# Patient Record
Sex: Female | Born: 1965
Health system: Southern US, Community
[De-identification: ages and names within clinical notes are randomized; demographics above are authoritative.]

## PROBLEM LIST (undated history)

## (undated) DIAGNOSIS — N393 Stress incontinence (female) (male): Secondary | ICD-10-CM

## (undated) DIAGNOSIS — I10 Essential (primary) hypertension: Secondary | ICD-10-CM

## (undated) DIAGNOSIS — F419 Anxiety disorder, unspecified: Secondary | ICD-10-CM

## (undated) DIAGNOSIS — M459 Ankylosing spondylitis of unspecified sites in spine: Secondary | ICD-10-CM

## (undated) DIAGNOSIS — IMO0001 Reserved for inherently not codable concepts without codable children: Secondary | ICD-10-CM

## (undated) DIAGNOSIS — M1990 Unspecified osteoarthritis, unspecified site: Secondary | ICD-10-CM

## (undated) DIAGNOSIS — F329 Major depressive disorder, single episode, unspecified: Secondary | ICD-10-CM

## (undated) DIAGNOSIS — M797 Fibromyalgia: Secondary | ICD-10-CM

## (undated) DIAGNOSIS — R351 Nocturia: Secondary | ICD-10-CM

## (undated) DIAGNOSIS — G8929 Other chronic pain: Secondary | ICD-10-CM

## (undated) DIAGNOSIS — R011 Cardiac murmur, unspecified: Secondary | ICD-10-CM

## (undated) DIAGNOSIS — J189 Pneumonia, unspecified organism: Secondary | ICD-10-CM

## (undated) DIAGNOSIS — J42 Unspecified chronic bronchitis: Secondary | ICD-10-CM

## (undated) DIAGNOSIS — R0789 Other chest pain: Secondary | ICD-10-CM

## (undated) DIAGNOSIS — E119 Type 2 diabetes mellitus without complications: Secondary | ICD-10-CM

## (undated) DIAGNOSIS — G2581 Restless legs syndrome: Secondary | ICD-10-CM

## (undated) DIAGNOSIS — T83712A Erosion of implanted urethral mesh to surrounding organ or tissue, initial encounter: Secondary | ICD-10-CM

## (undated) DIAGNOSIS — E079 Disorder of thyroid, unspecified: Secondary | ICD-10-CM

## (undated) DIAGNOSIS — K219 Gastro-esophageal reflux disease without esophagitis: Secondary | ICD-10-CM

## (undated) DIAGNOSIS — K589 Irritable bowel syndrome without diarrhea: Secondary | ICD-10-CM

## (undated) DIAGNOSIS — M549 Dorsalgia, unspecified: Secondary | ICD-10-CM

## (undated) DIAGNOSIS — R519 Headache, unspecified: Secondary | ICD-10-CM

## (undated) DIAGNOSIS — R51 Headache: Secondary | ICD-10-CM

## (undated) DIAGNOSIS — R350 Frequency of micturition: Secondary | ICD-10-CM

## (undated) DIAGNOSIS — Z8679 Personal history of other diseases of the circulatory system: Secondary | ICD-10-CM

## (undated) DIAGNOSIS — D869 Sarcoidosis, unspecified: Secondary | ICD-10-CM

## (undated) DIAGNOSIS — R569 Unspecified convulsions: Secondary | ICD-10-CM

## (undated) DIAGNOSIS — Z9109 Other allergy status, other than to drugs and biological substances: Secondary | ICD-10-CM

## (undated) DIAGNOSIS — G939 Disorder of brain, unspecified: Secondary | ICD-10-CM

## (undated) DIAGNOSIS — N3010 Interstitial cystitis (chronic) without hematuria: Secondary | ICD-10-CM

## (undated) DIAGNOSIS — G43909 Migraine, unspecified, not intractable, without status migrainosus: Secondary | ICD-10-CM

## (undated) DIAGNOSIS — G43109 Migraine with aura, not intractable, without status migrainosus: Secondary | ICD-10-CM

## (undated) DIAGNOSIS — J45909 Unspecified asthma, uncomplicated: Secondary | ICD-10-CM

## (undated) DIAGNOSIS — Z1589 Genetic susceptibility to other disease: Secondary | ICD-10-CM

## (undated) DIAGNOSIS — F32A Depression, unspecified: Secondary | ICD-10-CM

## (undated) DIAGNOSIS — B009 Herpesviral infection, unspecified: Secondary | ICD-10-CM

## (undated) DIAGNOSIS — E039 Hypothyroidism, unspecified: Secondary | ICD-10-CM

## (undated) HISTORY — DX: Migraine, unspecified, not intractable, without status migrainosus: G43.909

## (undated) HISTORY — DX: Major depressive disorder, single episode, unspecified: F32.9

## (undated) HISTORY — PX: SPINAL CORD STIMULATOR IMPLANT: SHX2422

## (undated) HISTORY — DX: Depression, unspecified: F32.A

## (undated) HISTORY — PX: NECK SURGERY: SHX720

## (undated) HISTORY — PX: ARTHROPLASTY: SHX135

## (undated) HISTORY — DX: Genetic susceptibility to other disease: Z15.89

## (undated) HISTORY — DX: Restless legs syndrome: G25.81

## (undated) HISTORY — DX: Interstitial cystitis (chronic) without hematuria: N30.10

## (undated) HISTORY — DX: Gastro-esophageal reflux disease without esophagitis: K21.9

## (undated) HISTORY — PX: BACK SURGERY: SHX140

## (undated) HISTORY — PX: KNEE ARTHROSCOPY: SUR90

## (undated) HISTORY — PX: PELVIC LAPAROSCOPY: SHX162

## (undated) HISTORY — DX: Unspecified asthma, uncomplicated: J45.909

---
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
## 1997-10-25 ENCOUNTER — Inpatient Hospital Stay (HOSPITAL_COMMUNITY): Admission: AD | Admit: 1997-10-25 | Discharge: 1997-10-27 | Payer: Self-pay | Admitting: Gynecology

## 1998-01-14 ENCOUNTER — Other Ambulatory Visit: Admission: RE | Admit: 1998-01-14 | Discharge: 1998-01-14 | Payer: Self-pay | Admitting: Gynecology

## 1998-05-08 ENCOUNTER — Ambulatory Visit (HOSPITAL_BASED_OUTPATIENT_CLINIC_OR_DEPARTMENT_OTHER): Admission: RE | Admit: 1998-05-08 | Discharge: 1998-05-08 | Payer: Self-pay | Admitting: Urology

## 1999-01-13 ENCOUNTER — Other Ambulatory Visit: Admission: RE | Admit: 1999-01-13 | Discharge: 1999-01-13 | Payer: Self-pay | Admitting: Gynecology

## 1999-02-11 ENCOUNTER — Other Ambulatory Visit: Admission: RE | Admit: 1999-02-11 | Discharge: 1999-02-11 | Payer: Self-pay | Admitting: Gynecology

## 1999-03-29 ENCOUNTER — Inpatient Hospital Stay (HOSPITAL_COMMUNITY): Admission: AD | Admit: 1999-03-29 | Discharge: 1999-03-29 | Payer: Self-pay | Admitting: Obstetrics and Gynecology

## 1999-05-04 ENCOUNTER — Inpatient Hospital Stay (HOSPITAL_COMMUNITY): Admission: AD | Admit: 1999-05-04 | Discharge: 1999-05-26 | Payer: Self-pay | Admitting: Gynecology

## 1999-05-05 ENCOUNTER — Encounter: Payer: Self-pay | Admitting: Gynecology

## 1999-05-11 ENCOUNTER — Encounter: Payer: Self-pay | Admitting: Obstetrics and Gynecology

## 1999-05-19 ENCOUNTER — Encounter: Payer: Self-pay | Admitting: Gynecology

## 1999-05-26 ENCOUNTER — Encounter (HOSPITAL_COMMUNITY): Admission: RE | Admit: 1999-05-26 | Discharge: 1999-08-24 | Payer: Self-pay | Admitting: Gynecology

## 1999-07-17 ENCOUNTER — Other Ambulatory Visit: Admission: RE | Admit: 1999-07-17 | Discharge: 1999-07-17 | Payer: Self-pay | Admitting: Gynecology

## 1999-08-27 ENCOUNTER — Encounter: Admission: RE | Admit: 1999-08-27 | Discharge: 1999-11-25 | Payer: Self-pay | Admitting: Gynecology

## 1999-09-01 HISTORY — PX: TUBAL LIGATION: SHX77

## 2000-02-23 ENCOUNTER — Ambulatory Visit (HOSPITAL_COMMUNITY): Admission: RE | Admit: 2000-02-23 | Discharge: 2000-02-23 | Payer: Self-pay | Admitting: Gynecology

## 2000-12-08 ENCOUNTER — Ambulatory Visit (HOSPITAL_BASED_OUTPATIENT_CLINIC_OR_DEPARTMENT_OTHER): Admission: RE | Admit: 2000-12-08 | Discharge: 2000-12-08 | Payer: Self-pay | Admitting: Orthopedic Surgery

## 2001-06-14 ENCOUNTER — Other Ambulatory Visit: Admission: RE | Admit: 2001-06-14 | Discharge: 2001-06-14 | Payer: Self-pay | Admitting: Gynecology

## 2001-10-26 ENCOUNTER — Ambulatory Visit (HOSPITAL_BASED_OUTPATIENT_CLINIC_OR_DEPARTMENT_OTHER): Admission: RE | Admit: 2001-10-26 | Discharge: 2001-10-26 | Payer: Self-pay | Admitting: Urology

## 2001-12-03 ENCOUNTER — Emergency Department (HOSPITAL_COMMUNITY): Admission: EM | Admit: 2001-12-03 | Discharge: 2001-12-03 | Payer: Self-pay | Admitting: Emergency Medicine

## 2002-08-08 ENCOUNTER — Other Ambulatory Visit: Admission: RE | Admit: 2002-08-08 | Discharge: 2002-08-08 | Payer: Self-pay | Admitting: Gynecology

## 2002-10-18 ENCOUNTER — Ambulatory Visit (HOSPITAL_COMMUNITY): Admission: RE | Admit: 2002-10-18 | Discharge: 2002-10-18 | Payer: Self-pay | Admitting: Gynecology

## 2003-08-28 ENCOUNTER — Other Ambulatory Visit: Admission: RE | Admit: 2003-08-28 | Discharge: 2003-08-28 | Payer: Self-pay | Admitting: Gynecology

## 2003-09-14 ENCOUNTER — Inpatient Hospital Stay (HOSPITAL_COMMUNITY): Admission: RE | Admit: 2003-09-14 | Discharge: 2003-09-17 | Payer: Self-pay | Admitting: Internal Medicine

## 2003-09-14 ENCOUNTER — Encounter (INDEPENDENT_AMBULATORY_CARE_PROVIDER_SITE_OTHER): Payer: Self-pay | Admitting: Specialist

## 2004-02-26 ENCOUNTER — Emergency Department (HOSPITAL_COMMUNITY): Admission: EM | Admit: 2004-02-26 | Discharge: 2004-02-27 | Payer: Self-pay | Admitting: Emergency Medicine

## 2004-03-11 ENCOUNTER — Ambulatory Visit (HOSPITAL_COMMUNITY): Admission: RE | Admit: 2004-03-11 | Discharge: 2004-03-11 | Payer: Self-pay | Admitting: Neurology

## 2004-08-31 HISTORY — PX: ABDOMINAL HYSTERECTOMY: SHX81

## 2004-09-19 ENCOUNTER — Other Ambulatory Visit: Admission: RE | Admit: 2004-09-19 | Discharge: 2004-09-19 | Payer: Self-pay | Admitting: Gynecology

## 2004-10-06 ENCOUNTER — Encounter: Admission: RE | Admit: 2004-10-06 | Discharge: 2004-10-06 | Payer: Self-pay | Admitting: *Deleted

## 2004-10-10 ENCOUNTER — Emergency Department (HOSPITAL_COMMUNITY): Admission: EM | Admit: 2004-10-10 | Discharge: 2004-10-10 | Payer: Self-pay | Admitting: Emergency Medicine

## 2004-11-24 ENCOUNTER — Encounter: Admission: RE | Admit: 2004-11-24 | Discharge: 2004-11-24 | Payer: Self-pay | Admitting: Gynecology

## 2005-05-05 ENCOUNTER — Emergency Department (HOSPITAL_COMMUNITY): Admission: EM | Admit: 2005-05-05 | Discharge: 2005-05-05 | Payer: Self-pay | Admitting: Family Medicine

## 2005-05-18 ENCOUNTER — Emergency Department (HOSPITAL_COMMUNITY): Admission: EM | Admit: 2005-05-18 | Discharge: 2005-05-18 | Payer: Self-pay | Admitting: Family Medicine

## 2005-12-08 ENCOUNTER — Other Ambulatory Visit: Admission: RE | Admit: 2005-12-08 | Discharge: 2005-12-08 | Payer: Self-pay | Admitting: Gynecology

## 2006-05-24 ENCOUNTER — Ambulatory Visit (HOSPITAL_BASED_OUTPATIENT_CLINIC_OR_DEPARTMENT_OTHER): Admission: RE | Admit: 2006-05-24 | Discharge: 2006-05-24 | Payer: Self-pay | Admitting: Urology

## 2006-08-31 HISTORY — PX: BACK SURGERY: SHX140

## 2006-10-24 ENCOUNTER — Emergency Department (HOSPITAL_COMMUNITY): Admission: EM | Admit: 2006-10-24 | Discharge: 2006-10-24 | Payer: Self-pay | Admitting: *Deleted

## 2007-02-02 ENCOUNTER — Encounter (INDEPENDENT_AMBULATORY_CARE_PROVIDER_SITE_OTHER): Payer: Self-pay | Admitting: Orthopedic Surgery

## 2007-02-02 ENCOUNTER — Ambulatory Visit (HOSPITAL_COMMUNITY): Admission: RE | Admit: 2007-02-02 | Discharge: 2007-02-03 | Payer: Self-pay | Admitting: Orthopedic Surgery

## 2007-02-02 HISTORY — PX: LUMBAR DISC SURGERY: SHX700

## 2007-02-16 ENCOUNTER — Other Ambulatory Visit: Admission: RE | Admit: 2007-02-16 | Discharge: 2007-02-16 | Payer: Self-pay | Admitting: Gynecology

## 2007-03-02 ENCOUNTER — Ambulatory Visit (HOSPITAL_BASED_OUTPATIENT_CLINIC_OR_DEPARTMENT_OTHER): Admission: RE | Admit: 2007-03-02 | Discharge: 2007-03-02 | Payer: Self-pay | Admitting: Urology

## 2007-03-02 HISTORY — PX: OTHER SURGICAL HISTORY: SHX169

## 2007-05-02 ENCOUNTER — Other Ambulatory Visit: Payer: Self-pay | Admitting: Internal Medicine

## 2007-05-02 ENCOUNTER — Other Ambulatory Visit: Payer: Self-pay | Admitting: Emergency Medicine

## 2007-05-02 ENCOUNTER — Inpatient Hospital Stay (HOSPITAL_COMMUNITY): Admission: AD | Admit: 2007-05-02 | Discharge: 2007-05-05 | Payer: Self-pay | Admitting: Internal Medicine

## 2007-05-11 ENCOUNTER — Encounter: Admission: RE | Admit: 2007-05-11 | Discharge: 2007-05-11 | Payer: Self-pay | Admitting: Orthopedic Surgery

## 2007-06-14 ENCOUNTER — Emergency Department (HOSPITAL_COMMUNITY): Admission: EM | Admit: 2007-06-14 | Discharge: 2007-06-14 | Payer: Self-pay | Admitting: Emergency Medicine

## 2007-11-15 ENCOUNTER — Encounter: Admission: RE | Admit: 2007-11-15 | Discharge: 2007-11-15 | Payer: Self-pay | Admitting: Emergency Medicine

## 2008-02-22 ENCOUNTER — Other Ambulatory Visit: Admission: RE | Admit: 2008-02-22 | Discharge: 2008-02-22 | Payer: Self-pay | Admitting: Gynecology

## 2008-02-22 ENCOUNTER — Encounter: Admission: RE | Admit: 2008-02-22 | Discharge: 2008-02-22 | Payer: Self-pay | Admitting: Gynecology

## 2008-03-28 ENCOUNTER — Encounter: Admission: RE | Admit: 2008-03-28 | Discharge: 2008-03-28 | Payer: Self-pay | Admitting: Gastroenterology

## 2008-04-02 ENCOUNTER — Emergency Department (HOSPITAL_COMMUNITY): Admission: EM | Admit: 2008-04-02 | Discharge: 2008-04-03 | Payer: Self-pay | Admitting: Emergency Medicine

## 2008-04-24 ENCOUNTER — Encounter: Admission: RE | Admit: 2008-04-24 | Discharge: 2008-04-24 | Payer: Self-pay | Admitting: Family Medicine

## 2008-10-10 ENCOUNTER — Emergency Department (HOSPITAL_COMMUNITY): Admission: EM | Admit: 2008-10-10 | Discharge: 2008-10-10 | Payer: Self-pay | Admitting: Emergency Medicine

## 2008-10-24 ENCOUNTER — Emergency Department (HOSPITAL_COMMUNITY): Admission: EM | Admit: 2008-10-24 | Discharge: 2008-10-24 | Payer: Self-pay | Admitting: Emergency Medicine

## 2008-12-01 ENCOUNTER — Emergency Department (HOSPITAL_COMMUNITY): Admission: EM | Admit: 2008-12-01 | Discharge: 2008-12-01 | Payer: Self-pay | Admitting: Emergency Medicine

## 2008-12-04 ENCOUNTER — Ambulatory Visit: Payer: Self-pay | Admitting: *Deleted

## 2008-12-04 ENCOUNTER — Inpatient Hospital Stay (HOSPITAL_COMMUNITY): Admission: RE | Admit: 2008-12-04 | Discharge: 2008-12-08 | Payer: Self-pay | Admitting: Orthopedic Surgery

## 2008-12-04 ENCOUNTER — Encounter (INDEPENDENT_AMBULATORY_CARE_PROVIDER_SITE_OTHER): Payer: Self-pay | Admitting: Orthopedic Surgery

## 2008-12-04 HISTORY — PX: LUMBAR FUSION: SHX111

## 2008-12-14 ENCOUNTER — Inpatient Hospital Stay (HOSPITAL_COMMUNITY): Admission: EM | Admit: 2008-12-14 | Discharge: 2008-12-15 | Payer: Self-pay | Admitting: Emergency Medicine

## 2008-12-25 ENCOUNTER — Emergency Department (HOSPITAL_COMMUNITY): Admission: EM | Admit: 2008-12-25 | Discharge: 2008-12-25 | Payer: Self-pay | Admitting: Emergency Medicine

## 2009-05-03 ENCOUNTER — Encounter: Admission: RE | Admit: 2009-05-03 | Discharge: 2009-05-03 | Payer: Self-pay | Admitting: Orthopedic Surgery

## 2009-05-06 ENCOUNTER — Emergency Department (HOSPITAL_COMMUNITY): Admission: EM | Admit: 2009-05-06 | Discharge: 2009-05-06 | Payer: Self-pay | Admitting: Family Medicine

## 2009-05-07 ENCOUNTER — Encounter: Admission: RE | Admit: 2009-05-07 | Discharge: 2009-05-07 | Payer: Self-pay | Admitting: Emergency Medicine

## 2009-05-08 ENCOUNTER — Emergency Department (HOSPITAL_COMMUNITY): Admission: EM | Admit: 2009-05-08 | Discharge: 2009-05-08 | Payer: Self-pay | Admitting: Emergency Medicine

## 2009-05-20 ENCOUNTER — Emergency Department (HOSPITAL_COMMUNITY): Admission: EM | Admit: 2009-05-20 | Discharge: 2009-05-20 | Payer: Self-pay | Admitting: Family Medicine

## 2009-07-22 ENCOUNTER — Encounter: Admission: RE | Admit: 2009-07-22 | Discharge: 2009-07-22 | Payer: Self-pay | Admitting: Family Medicine

## 2009-08-31 HISTORY — PX: ANTERIOR CERVICAL DECOMP/DISCECTOMY FUSION: SHX1161

## 2009-09-14 ENCOUNTER — Encounter: Admission: RE | Admit: 2009-09-14 | Discharge: 2009-09-14 | Payer: Self-pay | Admitting: Orthopedic Surgery

## 2009-12-13 ENCOUNTER — Ambulatory Visit: Payer: Self-pay | Admitting: Gynecology

## 2009-12-13 ENCOUNTER — Other Ambulatory Visit: Admission: RE | Admit: 2009-12-13 | Discharge: 2009-12-13 | Payer: Self-pay | Admitting: Gynecology

## 2010-02-12 ENCOUNTER — Encounter: Admission: RE | Admit: 2010-02-12 | Discharge: 2010-02-12 | Payer: Self-pay | Admitting: Internal Medicine

## 2010-02-28 ENCOUNTER — Ambulatory Visit (HOSPITAL_COMMUNITY): Admission: RE | Admit: 2010-02-28 | Discharge: 2010-02-28 | Payer: Self-pay | Admitting: Gastroenterology

## 2010-03-06 ENCOUNTER — Emergency Department (HOSPITAL_COMMUNITY): Admission: EM | Admit: 2010-03-06 | Discharge: 2010-03-06 | Payer: Self-pay | Admitting: Emergency Medicine

## 2010-03-11 ENCOUNTER — Inpatient Hospital Stay (HOSPITAL_COMMUNITY): Admission: RE | Admit: 2010-03-11 | Discharge: 2010-03-13 | Payer: Self-pay | Admitting: Orthopedic Surgery

## 2010-03-11 ENCOUNTER — Encounter (INDEPENDENT_AMBULATORY_CARE_PROVIDER_SITE_OTHER): Payer: Self-pay | Admitting: Orthopedic Surgery

## 2010-09-21 ENCOUNTER — Encounter: Payer: Self-pay | Admitting: Gynecology

## 2010-09-22 ENCOUNTER — Encounter: Payer: Self-pay | Admitting: Orthopedic Surgery

## 2010-11-16 LAB — URINALYSIS, ROUTINE W REFLEX MICROSCOPIC
Glucose, UA: NEGATIVE mg/dL
Hgb urine dipstick: NEGATIVE
pH: 5.5 (ref 5.0–8.0)

## 2010-11-16 LAB — CBC
HCT: 35.9 % — ABNORMAL LOW (ref 36.0–46.0)
Hemoglobin: 12.4 g/dL (ref 12.0–15.0)
MCV: 85.2 fL (ref 78.0–100.0)
RBC: 4.22 MIL/uL (ref 3.87–5.11)
WBC: 8.9 10*3/uL (ref 4.0–10.5)

## 2010-11-16 LAB — SURGICAL PCR SCREEN: Staphylococcus aureus: NEGATIVE

## 2010-11-16 LAB — DIFFERENTIAL
Basophils Absolute: 0 10*3/uL (ref 0.0–0.1)
Lymphocytes Relative: 22 % (ref 12–46)
Lymphs Abs: 2 10*3/uL (ref 0.7–4.0)
Monocytes Absolute: 0.4 10*3/uL (ref 0.1–1.0)
Monocytes Relative: 4 % (ref 3–12)
Neutro Abs: 6.4 10*3/uL (ref 1.7–7.7)

## 2010-11-16 LAB — POCT CARDIAC MARKERS: Troponin i, poc: <0.05 ng/mL (ref 0.00–0.09)

## 2010-11-16 LAB — COMPREHENSIVE METABOLIC PANEL
AST: 17 U/L (ref 0–37)
Albumin: 3.7 g/dL (ref 3.5–5.2)
Calcium: 8.7 mg/dL (ref 8.4–10.5)
Chloride: 108 mEq/L (ref 96–112)
Creatinine, Ser: 0.85 mg/dL (ref 0.4–1.2)
GFR calc Af Amer: >60 mL/min (ref >60)
Sodium: 138 mEq/L (ref 135–145)
Total Bilirubin: 0.2 mg/dL — ABNORMAL LOW (ref 0.3–1.2)

## 2010-11-16 LAB — APTT: aPTT: 27 seconds (ref 24–37)

## 2010-11-16 LAB — POCT I-STAT, CHEM 8
BUN: 16 mg/dL (ref 6–23)
Calcium, Ion: 1.1 mmol/L — ABNORMAL LOW (ref 1.12–1.32)
Chloride: 106 mEq/L (ref 96–112)
Creatinine, Ser: 0.7 mg/dL (ref 0.4–1.2)
Glucose, Bld: 132 mg/dL — ABNORMAL HIGH (ref 70–99)

## 2010-11-16 LAB — TYPE AND SCREEN
ABO/RH(D): O POS
Antibody Screen: NEGATIVE

## 2010-12-05 LAB — DIFFERENTIAL
Basophils Absolute: 0 10*3/uL (ref 0.0–0.1)
Lymphocytes Relative: 27 % (ref 12–46)
Neutro Abs: 5.7 10*3/uL (ref 1.7–7.7)
Neutrophils Relative %: 65 % (ref 43–77)

## 2010-12-05 LAB — BASIC METABOLIC PANEL
BUN: 11 mg/dL (ref 6–23)
Calcium: 9 mg/dL (ref 8.4–10.5)
GFR calc non Af Amer: >60 mL/min (ref >60)
Glucose, Bld: 107 mg/dL — ABNORMAL HIGH (ref 70–99)

## 2010-12-05 LAB — CBC
Platelets: 269 10*3/uL (ref 150–400)
RDW: 16.2 % — ABNORMAL HIGH (ref 11.5–15.5)

## 2010-12-10 LAB — CBC
HCT: 26.8 % — ABNORMAL LOW (ref 36.0–46.0)
HCT: 27.7 % — ABNORMAL LOW (ref 36.0–46.0)
HCT: 40.1 % (ref 36.0–46.0)
Hemoglobin: 14.1 g/dL (ref 12.0–15.0)
Hemoglobin: 9.4 g/dL — ABNORMAL LOW (ref 12.0–15.0)
Hemoglobin: 9.4 g/dL — ABNORMAL LOW (ref 12.0–15.0)
Hemoglobin: 9.5 g/dL — ABNORMAL LOW (ref 12.0–15.0)
MCHC: 35.3 g/dL (ref 30.0–36.0)
MCHC: 34.3 g/dL (ref 30.0–36.0)
MCHC: 34.9 g/dL (ref 30.0–36.0)
MCV: 86.4 fL (ref 78.0–100.0)
MCV: 88.9 fL (ref 78.0–100.0)
Platelets: 496 10*3/uL — ABNORMAL HIGH (ref 150–400)
Platelets: 567 10*3/uL — ABNORMAL HIGH (ref 150–400)
Platelets: 584 10*3/uL — ABNORMAL HIGH (ref 150–400)
Platelets: 261 10*3/uL (ref 150–400)
RBC: 4.12 MIL/uL (ref 3.87–5.11)
RBC: 3.12 MIL/uL — ABNORMAL LOW (ref 3.87–5.11)
RBC: 3.14 MIL/uL — ABNORMAL LOW (ref 3.87–5.11)
RBC: 4.64 MIL/uL (ref 3.87–5.11)
RDW: 13.9 % (ref 11.5–15.5)
RDW: 14.5 % (ref 11.5–15.5)
RDW: 14.1 % (ref 11.5–15.5)
WBC: 6.5 10*3/uL (ref 4.0–10.5)
WBC: 15.4 10*3/uL — ABNORMAL HIGH (ref 4.0–10.5)
WBC: 9.5 10*3/uL (ref 4.0–10.5)

## 2010-12-10 LAB — POCT I-STAT 7, (LYTES, BLD GAS, ICA,H+H)
Acid-base deficit: 1 mmol/L (ref 0.0–2.0)
Bicarbonate: 22.1 mEq/L (ref 20.0–24.0)
Calcium, Ion: 1.18 mmol/L (ref 1.12–1.32)
Calcium, Ion: 1.18 mmol/L (ref 1.12–1.32)
HCT: 26 % — ABNORMAL LOW (ref 36.0–46.0)
Hemoglobin: 8.8 g/dL — ABNORMAL LOW (ref 12.0–15.0)
Hemoglobin: 9.5 g/dL — ABNORMAL LOW (ref 12.0–15.0)
O2 Saturation: 100 %
O2 Saturation: 100 %
Patient temperature: 35.4
Patient temperature: 36.3
Potassium: 4.4 mEq/L (ref 3.5–5.1)
Potassium: 4.4 mEq/L (ref 3.5–5.1)
Sodium: 136 mEq/L (ref 135–145)
TCO2: 23 mmol/L (ref 0–100)
pCO2 arterial: 40.8 mmHg (ref 35.0–45.0)
pCO2 arterial: 46 mmHg — ABNORMAL HIGH (ref 35.0–45.0)
pH, Arterial: 7.33 — ABNORMAL LOW (ref 7.350–7.400)
pH, Arterial: 7.339 — ABNORMAL LOW (ref 7.350–7.400)
pO2, Arterial: 451 mmHg — ABNORMAL HIGH (ref 80.0–100.0)

## 2010-12-10 LAB — COMPREHENSIVE METABOLIC PANEL
ALT: 21 U/L (ref 0–35)
ALT: <8 U/L (ref 0–35)
AST: 23 U/L (ref 0–37)
Albumin: 4 g/dL (ref 3.5–5.2)
Alkaline Phosphatase: 98 U/L (ref 39–117)
BUN: 12 mg/dL (ref 6–23)
CO2: 24 mEq/L (ref 19–32)
Calcium: 8.4 mg/dL (ref 8.4–10.5)
Calcium: 8.9 mg/dL (ref 8.4–10.5)
Chloride: 105 mEq/L (ref 96–112)
Creatinine, Ser: 0.81 mg/dL (ref 0.4–1.2)
Creatinine, Ser: 0.75 mg/dL (ref 0.4–1.2)
GFR calc Af Amer: >60 mL/min (ref >60)
GFR calc non Af Amer: >60 mL/min (ref >60)
Glucose, Bld: 129 mg/dL — ABNORMAL HIGH (ref 70–99)
Glucose, Bld: 98 mg/dL (ref 70–99)
Potassium: 4.1 mEq/L (ref 3.5–5.1)
Sodium: 135 mEq/L (ref 135–145)
Sodium: 139 mEq/L (ref 135–145)
Total Bilirubin: 0.5 mg/dL (ref 0.3–1.2)
Total Protein: 6 g/dL (ref 6.0–8.3)
Total Protein: 6.7 g/dL (ref 6.0–8.3)

## 2010-12-10 LAB — URINALYSIS, ROUTINE W REFLEX MICROSCOPIC
Glucose, UA: NEGATIVE mg/dL
Glucose, UA: NEGATIVE mg/dL
Hgb urine dipstick: NEGATIVE
Hgb urine dipstick: NEGATIVE
Ketones, ur: NEGATIVE mg/dL
Ketones, ur: NEGATIVE mg/dL
Nitrite: NEGATIVE
Protein, ur: NEGATIVE mg/dL
Specific Gravity, Urine: 1.004 — ABNORMAL LOW (ref 1.005–1.030)
Specific Gravity, Urine: 1.023 (ref 1.005–1.030)
Urobilinogen, UA: 0.2 mg/dL (ref 0.0–1.0)
pH: 6 (ref 5.0–8.0)
pH: 7 (ref 5.0–8.0)

## 2010-12-10 LAB — DIFFERENTIAL
Basophils Absolute: 0 10*3/uL (ref 0.0–0.1)
Eosinophils Absolute: 0.1 10*3/uL (ref 0.0–0.7)
Eosinophils Absolute: 0.1 10*3/uL (ref 0.0–0.7)
Eosinophils Absolute: 0 10*3/uL (ref 0.0–0.7)
Lymphocytes Relative: 38 % (ref 12–46)
Lymphocytes Relative: 9 % — ABNORMAL LOW (ref 12–46)
Lymphocytes Relative: 27 % (ref 12–46)
Lymphs Abs: 1.5 10*3/uL (ref 0.7–4.0)
Lymphs Abs: 2.5 10*3/uL (ref 0.7–4.0)
Lymphs Abs: 2.5 10*3/uL (ref 0.7–4.0)
Monocytes Relative: 0 % — ABNORMAL LOW (ref 3–12)
Monocytes Relative: 6 % (ref 3–12)
Monocytes Relative: 6 % (ref 3–12)
Neutro Abs: 14.3 10*3/uL — ABNORMAL HIGH (ref 1.7–7.7)
Neutro Abs: 6.3 10*3/uL (ref 1.7–7.7)
Neutrophils Relative %: 53 % (ref 43–77)
Neutrophils Relative %: 88 % — ABNORMAL HIGH (ref 43–77)
Neutrophils Relative %: 89 % — ABNORMAL HIGH (ref 43–77)
Neutrophils Relative %: 66 % (ref 43–77)

## 2010-12-10 LAB — PROTIME-INR
INR: 1.4 (ref 0.00–1.49)
INR: 1 (ref 0.00–1.49)
INR: 1.3 (ref 0.00–1.49)
Prothrombin Time: 17.4 seconds — ABNORMAL HIGH (ref 11.6–15.2)
Prothrombin Time: 21.2 seconds — ABNORMAL HIGH (ref 11.6–15.2)
Prothrombin Time: 22 seconds — ABNORMAL HIGH (ref 11.6–15.2)
Prothrombin Time: 13.1 seconds (ref 11.6–15.2)
Prothrombin Time: 36 seconds — ABNORMAL HIGH (ref 11.6–15.2)

## 2010-12-10 LAB — CULTURE, BLOOD (ROUTINE X 2)

## 2010-12-10 LAB — BASIC METABOLIC PANEL
BUN: 10 mg/dL (ref 6–23)
CO2: 27 mEq/L (ref 19–32)
CO2: 27 mEq/L (ref 19–32)
Calcium: 8.5 mg/dL (ref 8.4–10.5)
Calcium: 7.3 mg/dL — ABNORMAL LOW (ref 8.4–10.5)
Chloride: 103 mEq/L (ref 96–112)
Creatinine, Ser: 0.97 mg/dL (ref 0.4–1.2)
GFR calc Af Amer: >60 mL/min (ref >60)
GFR calc Af Amer: >60 mL/min (ref >60)
GFR calc non Af Amer: >60 mL/min (ref >60)
Glucose, Bld: 122 mg/dL — ABNORMAL HIGH (ref 70–99)
Glucose, Bld: 94 mg/dL (ref 70–99)
Potassium: 3.4 mEq/L — ABNORMAL LOW (ref 3.5–5.1)
Potassium: 4 mEq/L (ref 3.5–5.1)
Sodium: 136 mEq/L (ref 135–145)
Sodium: 133 mEq/L — ABNORMAL LOW (ref 135–145)
Sodium: 136 mEq/L (ref 135–145)

## 2010-12-10 LAB — WOUND CULTURE

## 2010-12-10 LAB — TYPE AND SCREEN

## 2010-12-10 LAB — CK TOTAL AND CKMB (NOT AT ARMC)
Relative Index: INVALID (ref 0.0–2.5)
Total CK: 26 U/L (ref 7–177)

## 2010-12-10 LAB — APTT
aPTT: 31 seconds (ref 24–37)
aPTT: 28 seconds (ref 24–37)

## 2010-12-10 LAB — RAPID URINE DRUG SCREEN, HOSP PERFORMED
Barbiturates: NOT DETECTED
Benzodiazepines: POSITIVE — AB

## 2010-12-10 LAB — URINE CULTURE

## 2010-12-10 LAB — TROPONIN I: Troponin I: <0.01 ng/mL (ref 0.00–0.06)

## 2010-12-10 LAB — C-REACTIVE PROTEIN: CRP: 9.7 mg/dL (ref <0.6)

## 2010-12-10 LAB — ETHANOL: Alcohol, Ethyl (B): <5 mg/dL (ref 0–10)

## 2010-12-16 LAB — URINALYSIS, ROUTINE W REFLEX MICROSCOPIC
Glucose, UA: NEGATIVE mg/dL
Nitrite: NEGATIVE
Protein, ur: NEGATIVE mg/dL
pH: 6 (ref 5.0–8.0)

## 2010-12-16 LAB — URINE MICROSCOPIC-ADD ON

## 2010-12-16 LAB — POCT PREGNANCY, URINE: Preg Test, Ur: NEGATIVE

## 2011-01-13 NOTE — Discharge Summary (Signed)
[name], [name]              ACCOUNT NO.:  [id number]   MEDICAL RECORD NO.:  [id number]          PATIENT TYPE:  INP   LOCATION:  5010                         FACILITY:  [facility]   PHYSICIAN:  Michelene Gardener, MD    DATE OF BIRTH:  1966/06/18   DATE OF ADMISSION:  05/02/2007  DATE OF DISCHARGE:  05/05/2007                               DISCHARGE SUMMARY   PRIMARY CARE PHYSICIAN:  Dr. Lucita Ferrara   DISCHARGE DIAGNOSES:  1. Low back pain.  2. Hypothyroidism.  3. Depression.  4. History of herpes.   DISCHARGE MEDICATIONS:  1. Dilaudid 2 mg to take one to two tablets q.4 hours as needed.  2. Paxil 40 mg p.o. once daily.  3. Wellbutrin 45 mg at bedtime.  4. Trazodone 50 mg p.o. once daily at bedtime.  5. Lunesta 3 mg at bedtime.  6. Valtrex 500 mg once daily.  7. Robaxin 500 mg q.4-6 hours as needed.  8. Synthroid 175 mcg p.o. once daily.   CONSULTATIONS:  Orthopedic consult by Dr. Fayrene Fearing [place]   PROCEDURES:  None.   FOLLOWUP APPOINTMENTS:  1. Dr. Lucita Ferrara in one to two weeks.  2. Dr. Simonne Come in one to two weeks.   RADIOLOGY STUDIES:  Lumbar spine MRI done on September 1 that showed  postoperative changes in the right L5-S1 and there is facet-type atrophy  in L4-5 and disc bulging with anular tear.  There is no significant  stenosis.   COURSE OF HOSPITALIZATION:  This is a 45 year old female who had history  of microdiskectomy done on February 02, 2007 secondary to herniated nucleus  pulposus of L5-S1 done by Dr. Simonne Come.  Since that time, patient has  been having physical therapy and she has been followed on-and-off by Dr.  Simonne Come.  Came into the ER because of worsening low back pain.  Patient was admitted to the hospital.  MRI of the lumbar spine was done  and it came to be normal.  There is no evidence of acute problems.  Patient was seen by Dr. Simonne Come in the hospital and no acute  intervention was recommended.  Patient was given pain medications and  physical therapy was done in the hospital.  Recommendations are to  continue her pain medications as needed and to continue physical therapy  and to continue  outpatient followup with orthopedics.  Otherwise, other medical  conditions remained stable and patient was continued on the same other  medications taken at home prior to admission.   Total assessment time is 40 minutes.      Michelene Gardener, MD  Electronically Signed     NAE/MEDQ  D:  05/05/2007  T:  05/05/2007  Job:  [id number]   cc:   Lucita Ferrara, MD

## 2011-01-13 NOTE — Op Note (Signed)
NAMEMARLEN, [name]              ACCOUNT NO.:  [id number]   MEDICAL RECORD NO.:  [id number]          PATIENT TYPE:  AMB   LOCATION:  DAY                          FACILITY:  [facility]   PHYSICIAN:  Marlowe Kays, M.D.  DATE OF BIRTH:  05/19/1966   DATE OF PROCEDURE:  02/02/2007  DATE OF DISCHARGE:                               OPERATIVE REPORT   PREOPERATIVE DIAGNOSIS:  Herniated nucleus pulposus, L5-S1 right.   POSTOPERATIVE DIAGNOSIS:  Herniated nucleus pulposus, L5-S1 right.   OPERATION:  Microdiskectomy L5-S1 right.   SURGEON:  Marlowe Kays, M.D.   ASSISTANT:  Alvy Beal, MD.   ANESTHESIA:  General.   PATHOLOGY AND JUSTIFICATION FOR PROCEDURE:  Significant low back and  primarily right lower extremity pain with also some left lower extremity  pain and MRI of 10/17/2005 has demonstrated a right foraminal disk  protrusion which extends centrally and associated with what appeared to  be a conjoined nerve root.  Even though the MRI showed predominantly a  right-sided problem.  She was having some left lower extremity pain  which preoperatively, I attributed to pressure to the left from the  central and right disk.  At surgery as noted I obtained by a large  amount of disk material centrally and to the left.   PROCEDURE:  Prophylactic antibiotics, satisfied general anesthesia, time-  out performed, knee-chest position on the Shelton frame.  Back was  prepped with DuraPrep with two spinal needles and lateral x-ray I  tentatively located the L5-S1 disk level.  I then continued draping the  back in sterile field, Ioban employed.  Vertical midline incision down  to dissecting soft tissue off what I thought was the lamina of L5 and  the sacrum.  I tagged the spinous process of L5 and placed a Penfield 4  in the L5 S1 disk, took a second lateral x-ray indicating confirming  that we were at L5-S1.  Based on this placed self-retaining McCullough  retractor and began standard  microdiskectomy, removing some bone from  the inferior lamina of L5 and undermining the superior sacrum with small  curette and removing ligamentum flavum with 2 and 3 mm Kerrison rongeur.  After I completed the major portion of decompression, we then brought in  the microscope and completed the decompression.  I performed a wide  foraminotomy.  We are not able to immediately locate the L5-S1 disk,  accordingly I took a third x-ray which indicated the disk was somewhat  more superior than we had thought and after identifying the location I  performed a little additional decompression superiorly and we did locate  the disk which I opened with a 15 knife blade and removed large amount  of disk material with a combination of straight and angled up bite  pituitaries, Epstein curette and nerve hook.  I particularly removed a  lot material centrally and to the left with the upbite pituitaries.  When we removed all disk material obtainable and confirmed that the  foramen was widely patent and there was no unusual bleeding.  I  irrigated the wound with sterile saline and  placed Gelfoam soaked in  thrombin in the bed of the wound.  I removed the self-retaining  retractors, was no unusual bleeding.  She was given 30 mg of Toradol IV.  I closed the fascia with interrupted #1 Vicryl, subcu tissue combination  #1-0 and 2-0 Vicryl and infiltrated the subcutaneous tissue with  0.5% plain Marcaine and closed the skin with staples.  Betadine Adaptic  dry sterile dressing were applied.  She was gently placed on her PACU  bed and at the time of this dictation was on the way there with no  complications noted and minimal blood loss.           ______________________________  Marlowe Kays, M.D.     JA/MEDQ  D:  02/02/2007  T:  02/02/2007  Job:  [id number]

## 2011-01-13 NOTE — Op Note (Signed)
[name], Foster              ACCOUNT NO.:  [id number]   MEDICAL RECORD NO.:  [id number]          PATIENT TYPE:  AMB   LOCATION:  NESC                         FACILITY:  [facility]   PHYSICIAN:  Valetta Fuller, M.D.  DATE OF BIRTH:  1966/01/02   DATE OF PROCEDURE:  03/02/2007  DATE OF DISCHARGE:                               OPERATIVE REPORT   PREOPERATIVE DIAGNOSIS:  Stress urinary incontinence.   POSTOPERATIVE DIAGNOSIS:  Stress urinary incontinence.   PROCEDURE PERFORMED:  [place] retropubic suburethral sling.   SURGEON:  Valetta Fuller, M.D.   ANESTHESIA:  General.   INDICATIONS:  [name] is a 45 year old female.  She has had some  long standing problems with voiding issues and pelvic discomfort and is  felt to have interstitial cystitis.  She also recently had some new back  problems with some radicular discomfort and was diagnosed with a  herniated disc at the L5-S1 level.  She had had some change in voiding  patterns but did not really feel it was necessarily related to her  herniated disc.  She also came in complaining of a new complaint of  stress incontinence which was clinically bothersome to her.  She  underwent urodynamics which showed reduced functional bladder capacity.  She had a relatively compliant bladder with some hypersensitivity.  She  did not have any bladder instability.  Valsalva leak point pressure at  100 mL was 83 cmH2O pressure and at 200 mL, her leak point pressure was  58 cmH2O pressure.  She did have moderate objective incontinence and  leaked with every cough.  We felt that the patient did clearly have  urethral hypermobility and stress urinary incontinence that was  bothersome to her.  We talked about some of the pros and cons of anti-  incontinent surgery and suburethral sling specifically.  The patient  elected to proceed with that.  She appeared to understand the  advantages, disadvantages, and potential complications of that.   TECHNIQUE AND FINDINGS:  The patient was brought to the operating room  where she had successful induction of general anesthesia.  She was  placed in the lithotomy position, prepped and draped in the usual  manner.  A weighted vaginal speculum was utilized.  A Foley catheter was  placed and the bladder completely drained.  The anterior vaginal wall  was infiltrated with some of Marcaine with epinephrine.  A small  incision was made over the mid urethra.  We then gently dissected the  vaginal tissues until we were able to palpate underneath the retropubic  space bilaterally.  With the bladder empty, we made two small poke hole  incisions just lateral to the midline above the pubic symphysis.  With  direct digital finger control, we brought the retropubic needles down on  both sides of the incision bilaterally.  Once these needles were in  place, the Foley catheter was removed and flexible cystoscopy was  performed.  There was no evidence of any bladder injury and urine  remained clear.  The [place] sling was then attached to the needles and  brought up both  suprapubic incisions.  The sling was well positioned at  the mid urethra.  Positioning was done and tensioning was done with a  right angle clamp at the mid urethral level.  The sheath was then  removed and redundant sling was transected at the level of the skin.  At  that point, I noticed fairly copious venous bleeding from way up high in  the left retropubic space.  This appeared to be on this side of the  fascia and there was no bleeding from the retropubic incision.  Some  pressure was held for several minutes but there continued to be some  bleeding.  It appeared that there was a venous sinus that potentially  got torn with the passage of the sling.  It was not a position where we  really could put a suture.  For that reason, we obtained some FloSeal  which was then prepared in the standard manner and injected up into the  left space.   Surgicel was then placed and pressure was held for several  minutes.  At that point, hemostasis was excellent, there was no oozing,  whatsoever.  The vaginal incision was then closed with a running 2-0  Vicryl suture.  Some vaginal packing was utilized along with bacitracin  and a Foley catheter had been reinserted and left to straight drainage.  The urine remained quite clear.  Overall blood loss was about 400 mL.  The retropubic incisions were closed with Dermabond.  The patient  appeared to tolerate the procedure well, there were no obvious  complications.  Sponge and needle counts were correct.  She was brought  to the recovery room in stable condition.           ______________________________  Valetta Fuller, M.D.  Electronically Signed     DSG/MEDQ  D:  03/02/2007  T:  03/02/2007  Job:  [id number]   cc:   Gaetano Hawthorne. Lily Peer, M.D.  Fax: [phone]

## 2011-01-13 NOTE — H&P (Signed)
NAMEAREANA, [name]              ACCOUNT NO.:  [id number]   MEDICAL RECORD NO.:  [id number]          PATIENT TYPE:  INP   LOCATION:  3031                         FACILITY:  [facility]   PHYSICIAN:  Sabrina Beal, MD    DATE OF BIRTH:  10/23/1965   DATE OF ADMISSION:  12/14/2008  DATE OF DISCHARGE:                              HISTORY & PHYSICAL   ADMITTING HISTORY AND PHYSICAL:  Posterior lumbar wound infection.   SURGEON ON RECORDS:  Dr. Nelda Foster.   HISTORY:  [name] is a very pleasant 45 year old woman who was in her  usual state of good to health until about 3-4 days ago.  She is down 11  days out from anterior L5-S1 instrumented fusion with a total disk  replacement at L4-5 and a supplemental posterior pedicle screw fixation  L5-S1.  The patient was initially discharged without incident and was  seen approximately 2-3 days ago in the office by Dr. Alveda Reasons.  There was  no significant problems at that time.  The patient noted that shortly  after that outpatient visit, she was started having fevers and chills  and so presented to the emergency room today.  She also noted a more  serosanguineous drainage from the posterior wound.  As a result of the  symptoms, I was asked to evaluate for further management.  The patient  currently is in the emergency room with fever of 102, but no right  rigors.  She denies any significant pain.  No shortness of breath or  incontinence of bowel and bladder.  Her predominant pain is posterior  surgical pain as well as the anterior left thigh pain.   Her past medical, surgical family, social history as outlined in the PA  note and I have reviewed that, please refer to the handwritten notes for  specifics.   PHYSICAL EXAMINATION:  The patient is alert and oriented x3.  She is  neurologically intact.  Cranial nerves II-XII were tested.  There is no  deficits.  She has no shortness of breath or chest pain.  The abdomen is  soft and nontender.  The  anterior lumbar incision site is clean, dry,  and intact with no drainage.  She has full 5/5 motor strength in the  lower extremity.  There is left anterior thigh pain which is in present  since her anterior lumbar fusion.  She has no pain with passive range of  motion at the hip, knee, and ankle.  She has 2+ dorsalis pedis,  posterior tibialis pulses.  Capillary refill is less than 2 seconds in  all her digits.   The posterior lumbar incision, the superior aspect of erythema, but no  fluctuant mass.  There is no expressible drainage.  See written notes for lab work results   X-rays of the lumbar spine are pending.   At this point in time because of the history of fevers and chills, I  think it is reasonable to admit the patient.  Recommendations at this admission for IV antibiotics.  I did discuss  with the patient that if her symptoms intensify, then  we may need to  take her to the operating room for a formal debridement.  This would  include opening posterior incision, cleaning it out, and possibly  leaving it open.  All this was discussed with the patient.  She is aware  of the plan.   Add:  Optional plan will be to transfer the patient to [facility]  where Dr Toni Arthurs partner can admit the patient and if required perform  the formal surgical debridement.       Sabrina Beal, MD  Electronically Signed     DDB/MEDQ  D:  12/14/2008  T:  12/15/2008  Job:  [id number]   cc:   Sabrina Severe, MD

## 2011-01-13 NOTE — Discharge Summary (Signed)
NAMESCHYLAR, ALLARD              ACCOUNT NO.:  [id number]   MEDICAL RECORD NO.:  [id number]          PATIENT TYPE:  INP   LOCATION:  5013                         FACILITY:  [facility]   PHYSICIAN:  Nelda Severe, MD      DATE OF BIRTH:  March 06, 1966   DATE OF ADMISSION:  12/04/2008  DATE OF DISCHARGE:  12/08/2008                               DISCHARGE SUMMARY   FINAL DIAGNOSES:  1. L4-5 annular tear, status post L5-S1 disk excision.  2. Vitamin D insufficiency.   The patient was admitted to the hospital on December 04, 2008, for  management of low back pain.  She was taken to the operating room the  day of admission where a disk replacement was performed by an anterior  incision at L4-5 and an anterior interbody fusion performed at L5-S1,  and posterior fusion at L5-S1, also performed.   Vitamin D levels were determined at the time of her admission and her  result was 17, normal 30-89.  Therefore, she was deemed to be vitamin D  deficient, and has been started on therapeutic vitamin D.   Postoperatively, there were no complications.  She was treated with  Coumadin for DVT prophylaxis secondary to retraction of the common iliac  vessels at the time of her anterior surgery.  She is apparently very  sensitive to Coumadin and her INR is above 3 at the present time.  She  is going to take 2 mg of Coumadin tonight, and 2 mg tomorrow night, and  subsequently, she will be provided with guidance by the home health  pharmacologist as to her dosing, based on INR determinations when she is  at home.   She has been instructed to avoid bending and lifting.  She may squat.  She may discontinue her dressings and may shower.  She may drive  whenever she feels that she is comfortable enough to be able to use the  controls of the car, and is not taking narcotics.  She has been  specifically warned against the use of pain medication immediately prior  to driving.   DISCHARGE MEDICATIONS PROVIDED TODAY:  1. Vitamin D 50,000 units, 20 tablets, to be taken once weekly for 8      weeks, and then once monthly.  2. Coumadin 2 mg, tonight and tomorrow night, followed by dosing as      directed - prescription for 20 tablets.  3. Darvocet-N 100, 75 tablets, 1 q.4 h. p.r.n. pain.  4. I have written out for her the dose of calcium, which should be      taken, 1-1.5 g, which she can get over the counter.   She will be followed in the office on Jan 03, 2009.  She is to call me  for any wound drainage or fever.   At the time of discharge, she is stable.  She is ambulatory with a  walker, get in and out of bed, and tolerating normal diet and is voiding  spontaneously.   Also, noted is the fact that her preoperative pain is improved, and is  distinct from the postoperative  surgical pain she is currently  experiencing.      Nelda Severe, MD  Electronically Signed     MT/MEDQ  D:  12/08/2008  T:  12/09/2008  Job:  [id number]

## 2011-01-13 NOTE — Op Note (Signed)
NAMEESTELL, PUCCINI              ACCOUNT NO.:  [id number]   MEDICAL RECORD NO.:  [id number]          PATIENT TYPE:  INP   LOCATION:  5013                         FACILITY:  [facility]   PHYSICIAN:  Balinda Quails, M.D.    DATE OF BIRTH:  August 16, 1966   DATE OF PROCEDURE:  12/04/2008  DATE OF DISCHARGE:  12/01/2008                               OPERATIVE REPORT   SURGEON:  Balinda Quails, MD   CO-SURGEON:  Nelda Severe, MD   PREOPERATIVE DIAGNOSES:  L4-5, L5-S1 degenerative disk disease.   POSTOPERATIVE DIAGNOSES:  L4-5, L5-S1 degenerative disk disease.   PROCEDURE:  1. L4-5 artificial disk (Prodisc).  2. L5-S1 anterior lumbar interbody fusion.   CLINICAL NOTE:  [name] is a 45 year old female scheduled to  undergo L4-5 artificial disk, and L5-S1 ALIF.  She was seen and  evaluated preoperatively, found to be a candidate for the operative  procedure without contraindications.   Risks and benefits of the operative procedure were reviewed with the  patient and she consented for surgery.   OPERATIVE PROCEDURE:  The patient was brought to the operating room in  stable condition.  Placed under general endotracheal anesthesia.  Foley  catheter, arterial line, central venous catheter in place.  In a supine  position, the abdomen was prepped and draped in sterile fashion.   An oblique left paramedian skin incision was made extending from the  symphysis to the umbilicus.  Subcutaneous tissue divided by  electrocautery.  Deep dissection was carried down to the left anterior  rectus sheath, which was incised longitudinally.  Left rectus muscle  mobilized laterally and the left retroperitoneal space entered.  Peritoneum pushed off of the posterior rectus sheath, which was incised  longitudinally.   The retroperitoneal space then opened bluntly and the left psoas muscle  and genitofemoral nerve identified and the nerve left on the muscle.  The left common iliac artery and external  iliac artery were identified.  Lymphatics pushed laterally.  The L5-S1 disk was palpated inferior to  the bifurcation.  The L5-S1 disk was easily palpated.  Blunt dissection  used to expose the disk.  Middle sacral vessels controlled with bipolar  cautery and clips.  The L5-S1 disk was exposed from left-to-right with  blunt dissection.   Attention was then placed on exposure of L4-5.  This was approached from  laterally.  The left common external iliac artery was pushed medially.  The lymphatics pushed laterally.  The left common and external iliac  vein were then exposed.  Two significant iliolumbar branches were  identified, ligated with 2-0 silk and divided.  The L5 nerve root in the  left side was clearly identified and preserved.   The L4-5 disk was palpated.  The L4 segmental vessels were controlled  with a combination of bipolar cautery and clips.  These were divided.  The left common iliac artery and vein were then pushed to the right.  Blunt dissection used to expose the L4-5 disk completely.   The full exposure of the L4-5 disk reverse lip blades were used with a  Thompson retractor to  expose the disk.  Dr. Alveda Reasons then verified level  with fluoroscopy and completed L4-5 Prodisc.   Following this, the L5-S1 disk was treated by Dr. Alveda Reasons.  Closure  dictated under separate heading by Dr. Alveda Reasons.   Note was made of the left ureter, which was preserved throughout the  operative procedure and mobilized with the peritoneal contents.      Balinda Quails, M.D.  Electronically Signed     PGH/MEDQ  D:  12/04/2008  T:  12/04/2008  Job:  [id number]   cc:   Nelda Severe, MD

## 2011-01-13 NOTE — H&P (Signed)
[name], [name]              ACCOUNT NO.:  [id number]   MEDICAL RECORD NO.:  [id number]           PATIENT TYPE:   LOCATION:                                 FACILITY:   PHYSICIAN:  Nelda Severe, MD      DATE OF BIRTH:  [birth year]   DATE OF ADMISSION:  DATE OF DISCHARGE:                              HISTORY & PHYSICAL   CHIEF COMPLAINT:  Lumbar pain, left lower extremity pain, lateral  anterior with decreased sensation, anterior thigh and anterior shin.   PAST MEDICAL HISTORY:  1. Migraines.  2. Anxiety.  3. Reflux.  4. Double vision.   PAST SURGICAL HISTORY:  1. Hysterectomy.  2. Bladder sling.  3. Lumbar laminectomy.  4. Tubal ligation.   DRUG ALLERGY:  VICODIN that causes itching and rash.   MEDICATIONS:  1. Midrin p.r.n. for migraines.  2. Pristiq 50 mg p.o. daily.  3. Valtrex 500 mg p.o. daily.  4. Nexium 25 mg p.o. daily.  5. Synthroid 0.150 mg daily.  6. Valium 2 mg daily.  7. Trazodone nightly.   FAMILY HISTORY:  1. Hypertension.  2. Coronary artery disease.  3. Diabetes.  4. Lung cancer.  5. Stroke.   REVIEW OF SYSTEMS:  She reports no fever, no chills.  No nausea,  vomiting, or diarrhea.  No melena.  No hemoptysis.   PHYSICAL EXAMINATION:  GENERAL:  She is normocephalic.  EYES:  Pupils equal, round, reactive to light.  NECK:  Supple.  Full range of motion without pain.  CHEST:  Clear to auscultation.  No wheezes were noted.  HEART:  Regular rate and rhythm.  No murmurs were noted.  ABDOMEN:  Soft, nontender, palpation.  Positive bowel sounds were  auscultated.  LOWER EXTREMITY:  She is grossly neurovascularly motor intact.  She has  decreased sensation to left anterior thigh, anterior shin, compared to  the right lower extremity.  She can heel walk and toe walk.   MRI, disk.   OPERATIVE PLAN:  ProDisc replacement, L4-5 anterior with L5-S1 anterior-  posterior fusion by Dr. Nelda Severe.      [name], P.A.      Nelda Severe, MD  Electronically Signed    MC/MEDQ  D:  12/03/2008  T:  12/04/2008  Job:  [id number]

## 2011-01-13 NOTE — H&P (Signed)
[name], [name]              ACCOUNT NO.:  [id number]   MEDICAL RECORD NO.:  [id number]          PATIENT TYPE:  EMS   LOCATION:  ED                           FACILITY:  [facility]   PHYSICIAN:  Kela Millin, M.D.DATE OF BIRTH:  [birth year]   DATE OF ADMISSION:  05/02/2007  DATE OF DISCHARGE:                              HISTORY & PHYSICAL   PRIMARY CARE PHYSICIAN:  Dr. Flonnie Overman.   ORTHOPEDIC SURGEON:  Dr. Simonne Come.   CHIEF COMPLAINT:  Worsening low back pain.   HISTORY OF PRESENT ILLNESS:  The patient is a 45 year old female with  past medical history significant for status post microdiscectomy on February 02, 2007, for herniated nucleus pulposus of the L5-S1 level on the right  per Dr. Simonne Come, stress incontinence, status post retropubic  suburethral sling per Dr. Isabel Caprice, depression, hypothyroidism, history of  interstitial cystitis, history of chronic pelvic pain and dysmenorrhea,  history of extensive abdominopelvic adhesions status post lysis/surgery,  history of herpes (HSV2), and history of migraines who presents with the  above complaint.  She states that she has had some pain in her back from  about two weeks since her surgery but that it has progressively  worsened.  [name] reports that she saw Dr. Simonne Come on April 27, 2007, and was scheduled to have a bone scan done.  She denies any recent  trauma, fevers, abdominal pain, melena, dysuria, or hematochezia.  She  admits to nausea but no vomiting.   The patient was seen in the ER, and the urinalysis was negative for  infection.  She admitted for pain control and further evaluation and  management.   PAST MEDICAL HISTORY:  As stated above.   MEDICATIONS:  1. Trazodone 50 mg by mouth nightly.  2. Synthroid 0.175 mg daily.  3. Paxil 40 mg daily.  4. Valtrex 50 mg daily.  5. Wellbutrin 150 mg nightly.  6. Valium 2 mg four times a day as needed.  7. Robaxin 500 mg every 4 to 6 hours as needed.  8. Talwin.  9.  Ultram.   ALLERGIES:  Hydrocodone.   SOCIAL HISTORY:  Positive for tobacco.  She denies alcohol.   FAMILY HISTORY:  Her mother is deceased.  She had diabetes, hypertension  and coronary artery disease/CABG, and strokes.  Her father has angina  and peripheral vascular disease/carotid artery stenosis.   REVIEW OF SYSTEMS:  As per HPI, other review of systems negative.   PHYSICAL EXAMINATION:  GENERAL:  The patient is a middle-aged female,  appears uncomfortable secondary to back pain, and in no respiratory  distress.  VITAL SIGNS:  Temperature is 98.3 with a blood pressure of 109/70, a  pulse of 87, a respiratory rate of 20, and an O2 saturation of 95%.  HEENT:  PERRL.  EOMI.  Sclerae are anicteric.  Moist mucous membranes.  No oral exudates.  LUNGS:  Clear to auscultation bilaterally.  No crackles.  No wheezes.  CARDIOVASCULAR:  Regular rate and rhythm.  Normal S1 and S2.  ABDOMEN:  Soft.  Mild lower abdominal tenderness.  Bowel sounds present.  No  rebound tenderness.  No mass is palpable.  No organomegaly.  EXTREMITIES:  No cyanosis and no edema.  The straight leg raise on the  right is positive at about 10 degrees.  She is unable to lift her left  leg secondary to the pain.   LABORATORY DATA:  Urinalysis is negative for infection.  Hemoglobin is  also negative.  Her white cell count is 9.6.  Hemoglobin of 12.1.  Hematocrit of 35.3.  Platelet count of 272.  Her sodium is 138 with a  potassium of 4.2, a chloride of 107, a CO2 of 22, a glucose of 100, a  BUN of 13, a creatinine of 0.78, and a calcium of 8.5.  Her AST is 49.   ASSESSMENT AND PLAN:  1. Worsening low back pain - status post surgery, as discussed above,      per Dr. Simonne Come.  Will obtain an MRI to evaluate for possible      infection.  Urinalysis is negative for infection, as above.      Intravenous analgesics for pain control.  Consult Orthopedics for      further recommendations.  2. Hypothyroidism - continue  Synthroid.  3. Depression - continue outpatient medications.  4. History of herpes - continue Valtrex.      Kela Millin, M.D.  Electronically Signed     ACV/MEDQ  D:  05/02/2007  T:  05/02/2007  Job:  [id number]   cc:   Sabrina Ferrara, MD  Fax: [phone]  Email: [email or web address]   [name], M.D.  Fax: [phone]   [name], M.D.  Fax: [phone]

## 2011-01-13 NOTE — Op Note (Signed)
[name], [name] NO.:  [id number]   MEDICAL RECORD NO.:  [id number]          PATIENT TYPE:  INP   LOCATION:  5013                         FACILITY:  [facility]   PHYSICIAN:  Nelda Severe, MD      DATE OF BIRTH:  [birth year]   DATE OF PROCEDURE:  12/04/2008  DATE OF DISCHARGE:                               OPERATIVE REPORT   SURGEON:  Nelda Severe, MD   ASSISTANT:  Lianne Cure, PA-C   PREOPERATIVE DIAGNOSIS:  Annular tear L4-5, status post L5-S1 disk  excision.   POSTOPERATIVE DIAGNOSIS:  Annular tear L4-5, status post L5-S1 disk  excision.   OPERATIVE PROCEDURE:  1. L4-5 total disk arthroplasty, L5-S1 anterior interbody fusion - Dr.      Liliane Bade, co-surgeon.  Insertion of Titan cage at L5-S1 with door      stop screw, local bone graft admixed with bone marrow aspirate,      hyaluronic acid (InQu) and beta tricalcium phosphate morsels.  2. Posterior fusion L5-S1 with pedicle screws, rods, graft mixture of      bone marrow aspirate/InQu/beta tricalcium phosphate/local bone      graft.  3. Aspiration of L5 vertebral body for bone marrow.   The patient was placed under general endotracheal anesthesia.  In the  preoperative area. a central line had been established.  A pulse  oximeter was placed on the left great toe.  The patient received  intravenous antibiotics prophylactically.  A Foley catheter was placed  in the bladder.  Sequential compression devices were placed on both  lower extremities.  She was positioned supine on a flat top Pinnacle  table.  The arms were abducted at the sides and supported on arm boards.  A cross-table lateral fluoroscopy shot was taken with a long pair of  forceps being used as a radiopaque marker to project forward from the  anterior aspect of the L5-S1 disk to determine the lowest point the  incision needed to be.  This was marked with a skin marker.  The abdomen  was then prepped with DuraPrep and draped in square  fashion.  The drapes  were secured with Ioban.   Dr. Madilyn Fireman performed, via a left lower abdominal incision, a  retroperitoneal exposure of L5-S1 and L4-5, including mobilization of  the common iliac vein.   At L4-5, the retractors having been placed, we placed a screw in the  disk we estimate centered to be.  An AP fluoroscopy shot was taken and I  felt that we were slightly right off center.  The cautery was then used  to mark the vertebral body above and below that was considered to be the  true center.  A rectangular discotomy was then performed.  Using disk  elevators and curettes, we then enucleated the L4-5 disk posteriorly to  the outer fibers of the annulus and posterior longitudinal ligament.  An  annular tear was identified posterolaterally on the left side.   We sized the disk to a medium footprint, 6 degrees, 11 mm.  Under  biplane fluoroscopic guidance, the cutting block was positioned, slots  cut in the endplates of L4 and L5, and the implant inserted, under  fluoroscopic guidance laterally.   The polyethylene articular surface was then inserted being careful to  make sure that it was convex forward and convex superior.  It was tamped  into place and then were no gap and no steps.  The inserting device was  removed.  A cross-table lateral and AP fluoroscopic views were taken of  the disk replacement which showed satisfactory position.   The retractors were then removed.  We moved to the L5-S1 level, where  the retractors were placed to retract the iliac vessels bilaterally.  The disk was fenestrated anteriorly and then enucleated in the same  fashion as that described for L4-5.  The broad chisel was used to remove  a very thin layer of superior endplate from S1 as vigorous curettage of  the endplate had not revealed any bleeding bone whatsoever.  Also, a  small amount of bone was removed with the broad chisel from the inferior  aspect anteriorly at L5.  A small  footprint Titan cage of appropriate  height was sized and implanted containing a mixture of bone marrow  aspirate, beta tricalcium phosphate, InQu (hyaluronic acid), and local  bone graft.  Prior to placing the cage, I had aspirated approximately 20  mL of bone marrow from the L5 vertebral body through an 18-gauge spinal  needle.   A closed door stop screw and washer were placed inferiorly into S1, so  the washer would impinge against the cage and prevent it from displacing  anteriorly.   The retractors were then removed in sequence.  The ureter was inspected  and appeared to be completely intact as did the iliac vessels, visible  on the left side.   The anterior rectus sheath was then closed with a combination of  interrupted and continuous #1 Vicryl suture.  The subcutaneous layer was  closed using interrupted inverted 2-0 Vicryl suture.  The skin was  closed using subcuticular 3-0 undyed Vicryl in continuous fashion.  The  wound was then dressed with a Mepilex dressing.   The patient was then turned into the prone position onto the [place]  frame without event.  Care was taken to position the upper extremities  so as to avoid hyperflexion and abduction of the shoulders and so as to  avoid hyperflexion of the elbows.  The upper extremities were padded  with foam from axilla to hands.  The thighs, knees, shins, and ankles  were supported on pillows.   A vertical midline incision was marked with a skin marker.  The prior  diskectomy incision was to the right of center and was not included in  the incision.  The lumbar area was prepped with DuraPrep and draped in  rectangular fashion.  The drapes were secured with Ioban.  The skin was  scored in line with the skin marking and subcutaneous tissue was  injected with a mixture of 0.25% plain Marcaine and 1% lidocaine with  epinephrine.  Dissection was carried down to the spinous processes using  cutting current.  The paraspinal muscles  were mobilized bilaterally from  the L4 spinous process to the dorsum of the sacrum so as to expose the  ala of the sacrum bilaterally, and the transverse processes of L5  bilaterally.   We raised more local bone graft by decorticating the lamina of L5  bilaterally and removing a portion of both inferior articular processes  to expose the articular  surface of the L5-S1 facet joints bilaterally.  These fragments of bone were then morcellized in bone mill and admixed  to the already mentioned mixture of graft material.  I then decorticated  the ala of the sacrum and transverse process on the right side.  A hole  was then made in the sacrum, tapped to 7.2 mm diameter and appropriate  length screw inserted based on depth measurement using a ball-tip probe,  which was also used to palpate the hole circumferentially to make sure  that there were no breaches.  A pedicle hole was also placed at L5 on  the right side.  Prior to placing the screws, we had already placed  moderate quantity of the graft mixture between the ala of the sacrum and  transverse process.  Each screw was then stimulated electrically and  distal EMG activity recorded.  In both instances, the level to  stimulation was well above the critical level.   We then attached a straight rod and coupled it and torqued the screws.   The same procedure was carried out on the left side exactly.  We then  further burred the articular cartilage of S1, the superior articular  process bilaterally.  The rest of the graft material was pressed into  the facet joints bilaterally.  Gelfoam was placed over graft material to  prevent it from migrating into the wound.  A 15-gauge Blake drain was  placed subfascially and secured to the skin on the right side with a 2-0  nylon suture.  The thoracolumbar fascia was closed using continuous  interrupted #1 Vicryl suture.  The subcutaneous layer was closed using 2-  0 inverted Vicryl suture.  The skin  was closed using a subcuticular 3-0  undyed Vicryl in running fashion.  A nonadherent antibiotic ointment  dressing was attached and secured with OpSite.   Estimated blood loss around 500-600 mL.  There were no intraoperative  complications.  At the time of dictation, the patient has not yet been  awakened and returned to recovery room, so no neurologic report is  recorded here.   Sponge and needle counts were correct.  Also, the abdominal film taken  after the first stage anterior procedure revealed no retained foreign  material.      Nelda Severe, MD  Electronically Signed     MT/MEDQ  D:  12/04/2008  T:  12/05/2008  Job:  [id number]

## 2011-01-16 NOTE — Consult Note (Signed)
NAMEHAMNA, ASA              ACCOUNT NO.:  [id number]   MEDICAL RECORD NO.:  [id number]          PATIENT TYPE:  EMS   LOCATION:  MAJO                         FACILITY:  [facility]   PHYSICIAN:  Marlan Palau, M.D.  DATE OF BIRTH:  08/19/1966   DATE OF CONSULTATION:  10/10/2004  DATE OF DISCHARGE:                                   CONSULTATION   HISTORY OF PRESENT ILLNESS:  [name] is a 45 year old right-handed  white female born on 1966-03-21, with a history of interstitial  cystitis and headaches.  The patient is followed by Dr. Clarisse Gouge for headaches.  The patient has a history of chest pains.  She was seen today for cardiac  catheterization, which was performed uneventfully.  The patient was given  Valium 5 mg, Versed 1 mg for the procedure.  After the procedure, the  patient was noted to have chills and trembling.  The patient seemed to be  somewhat confused, but responded to Romazicon injection.  The patient then,  however, developed generalized weakness and was brought to the emergency  room for further evaluation.  The patient claimed initially that she could  feel everything on both sides, but could not move her arms or legs.  Once in  the emergency room, however, the patient now gives right-sided numbness and  weakness, vision changes off of the right and left side has now recovered.  CT of the head is unremarkable.  Neurology is asked to see the patient for  further evaluation.   PAST MEDICAL HISTORY:  1.  History of new-onset generalized weakness progressing to right-sided      weakness and right visual field deficit.  2.  Noncardiac chest pain.  Catheterization done today was unremarkable.  3.  Chronic pelvic pain and interstitial cystitis.  4.  History of migraines.  5.  History of hypothyroidism.  6.  History of herpes simplex virus infection.  7.  Status post hysterectomy.  8.  Cesarean section in the past.  9.  Right-sided arthroscopic knee  surgery.  10. History of tonsillectomy.  11. History of appendectomy.   MEDICATIONS:  1.  Synthroid 0.115 mg daily.  2.  Topamax 100 mg q.h.s.  3.  Trazodone 100 mg q.h.s.  4.  Paxil 40 mg daily.  5.  Wellbutrin 450 mg daily.  6.  Midrin if needed.  7.  Aspirin 81 mg daily.  8.  Calcium supplementation.  9.  Multivitamins.   ALLERGIES:  No known drug allergies.   SOCIAL HISTORY:  She smokes 1/2 pack of cigarettes a day.  She does not  drink alcohol.  This patient is married and lives in the [place], [place], area.  She has four children.  The youngest has cerebral palsy and  some sort of chronic lung disease.  The rest of the children are in good  health.   FAMILY MEDICAL HISTORY:  Mother died with heart disease.  Father is alive,  but has a history of heart disease.  The patient has ________ brother, one  sister.  Brother has migraines.   REVIEW  OF SYMPTOMS:  Positive for chills, trembly spell after the cardiac  catheterization today.  The patient does note chest pain and shortness of  breath.  She has had some nausea, but no vomiting.  She does note daily  chronic headaches.  She has had episodes of right-sided weakness and  numbness with the headaches before.  She has spots in front of the eyes  associated with visual field complaints associated with headache.  The  patient does have diarrhea off and on.   PHYSICAL EXAMINATION:  VITAL SIGNS:  Blood pressure 122/83, heart rate 80,  respiratory rate 20, temperature - afebrile.  GENERAL:  The patient is a minimally-obese white female, who is alert and  cooperative at the time of examination.  HEENT:  Head is atraumatic.  Eyes - Pupils are equal, round and reactive to  light.  Disks are flat bilaterally.  NECK:  Supple, no carotid bruits.  RESPIRATORY EXAMINATION:  Clear.  CARDIOVASCULAR EXAMINATION:  Regular rate and rhythm.  No obvious murmurs or  rubs noted.  EXTREMITIES:  Without significant edema.   NEUROLOGICAL EXAM:  Cranial nerves as above.  The patient has good facial  symmetry, has good pinprick on the left, depressed on the right.  The  patient splits the midline with vibratory sensation.  The patient _________  stimulation gives a right homonymous visual field deficit, splitting the  midline with visual field disturbance, however, when the right eye is  covered, the left eye has full visual fields.  The right eye only has a  homonymous visual field deficit.  The patient has normal speech pattern, no  aphasia.  The patient has inconsistent motor examination.  She holds both  arms up for just a moment and then lets the right arm drop.  The patient  will not allow the right hand to smack her in the face.  The patient again  shows poor motor effort with her right leg, will not depress the right heel  into the bed, but when the left leg is elevated, the patient is able to  depress the right leg into the bed.  The patient will not lift the leg  against gravity on the right.  The patient is able to lift the left leg.  The patient is able to perform finger-nose-finger with the right upper  extremity, finger-nose-finger on the left side will not perform on the  right.  The patient was not ambulated.  Deep tendon reflexes are symmetric.  Normal toes neutral bilaterally.  Pinprick sensation is depressed on the  right arm and leg, as is vibratory sensation, normal on the left.   LABORATORY DATA:  No laboratory values are available at this time.   Again, CT of the head is unremarkable.   IMPRESSION:  1.  Probable functional right hemiparesis and visual field deficit.  2.  History of migraine headache.  3.  History of noncardiac chest pain.  4.  Interstitial cystitis.   This patient has a functional examination that does not follow anatomic  patterns that would be consistent with a stroke.  The patient's examination even during the course of the day today has evolved from generalized   weakness with no sensory complaints to right-sided weakness with sensory  complaints and visual field changes.  I would not recommend a cerebral  angiogram at this point, but would rather pursue a MRI scan of the brain  with MRI angiogram.   PLAN:  1.  MRI of the brain.  2.  Intracranial MRI angiogram.  3.  If the above studies are unremarkable, would allow the patient to be      discharged to home.      CKW/MEDQ  D:  10/10/2004  T:  10/10/2004  Job:  [id number]   cc:   Cristy Hilts. Jacinto Halim, MD  [address]  [place]  [place] [address]  Fax: [phone]

## 2011-01-16 NOTE — Procedures (Signed)
CLINICAL INFORMATION:  This 45 year old female is alert and oriented. She  states that two weeks ago she passed out.   TECHNICAL DESCRIPTION:  This EEG was recorded during the awake state. The  background activity shows well formed, well modulated, 12 to 14 hertz  rhythms with higher amplitudes seen in the posterior head regions  bilaterally. No evidence of any stage II sleep present. Photic stimulation  was performed which did not produce any evidence of a driving response.  Hyperventilation testing produced no significant abnormalities.   IMPRESSION:  This is a normal EEG during the awake state.    Evie Lacks, M.D.   ZOX:WRUE  D:  03/12/2004 12:59:45  T:  03/12/2004 15:35:56  Job #:  [id number]

## 2011-06-11 LAB — CBC
HCT: 39
MCV: 85.6
Platelets: 286
RBC: 4.56
WBC: 10.3

## 2011-06-11 LAB — COMPREHENSIVE METABOLIC PANEL
AST: 30
Albumin: 4
Alkaline Phosphatase: 81
BUN: 12
CO2: 25
Chloride: 104
Creatinine, Ser: 0.84
GFR calc Af Amer: >60
GFR calc non Af Amer: >60
Potassium: 3.8
Total Bilirubin: 0.7

## 2011-06-11 LAB — POCT CARDIAC MARKERS
CKMB, poc: 1.4
Myoglobin, poc: 109
Myoglobin, poc: 124
Operator id: 272551
Operator id: 272551
Troponin i, poc: <0.05

## 2011-06-11 LAB — LIPASE, BLOOD: Lipase: 14

## 2011-06-11 LAB — DIFFERENTIAL
Basophils Absolute: 0.1
Basophils Relative: 1
Eosinophils Relative: 1
Monocytes Absolute: 0.6

## 2011-06-11 LAB — D-DIMER, QUANTITATIVE: D-Dimer, Quant: 0.27

## 2011-06-12 LAB — COMPREHENSIVE METABOLIC PANEL
ALT: 75 — ABNORMAL HIGH
Alkaline Phosphatase: 83
BUN: 13
CO2: 22
GFR calc non Af Amer: >60
Glucose, Bld: 100 — ABNORMAL HIGH
Potassium: 4.2
Sodium: 138
Total Bilirubin: 0.7

## 2011-06-12 LAB — CBC
HCT: 35.3 — ABNORMAL LOW
Hemoglobin: 12.1
RBC: 4.13

## 2011-06-12 LAB — URINALYSIS, ROUTINE W REFLEX MICROSCOPIC
Bilirubin Urine: NEGATIVE
Glucose, UA: NEGATIVE
Ketones, ur: NEGATIVE
Nitrite: NEGATIVE
Specific Gravity, Urine: 1.013
pH: 5.5

## 2011-06-12 LAB — DIFFERENTIAL
Basophils Absolute: 0.1
Basophils Relative: 1
Eosinophils Absolute: 0.2
Monocytes Relative: 5
Neutro Abs: 6.2
Neutrophils Relative %: 65

## 2011-06-16 LAB — POCT HEMOGLOBIN-HEMACUE
Hemoglobin: 13.9
Operator id: 133231

## 2011-06-18 LAB — PROTIME-INR: INR: 0.9

## 2012-06-10 ENCOUNTER — Emergency Department (HOSPITAL_COMMUNITY): Payer: Managed Care, Other (non HMO)

## 2012-06-10 ENCOUNTER — Emergency Department (HOSPITAL_COMMUNITY)
Admission: EM | Admit: 2012-06-10 | Discharge: 2012-06-10 | Disposition: A | Discharge status: Home / Self Care | Payer: Managed Care, Other (non HMO) | Attending: Emergency Medicine | Admitting: Emergency Medicine

## 2012-06-10 ENCOUNTER — Encounter (HOSPITAL_COMMUNITY): Payer: Self-pay | Admitting: Radiology

## 2012-06-10 DIAGNOSIS — R112 Nausea with vomiting, unspecified: Secondary | ICD-10-CM | POA: Insufficient documentation

## 2012-06-10 DIAGNOSIS — R5383 Other fatigue: Secondary | ICD-10-CM | POA: Insufficient documentation

## 2012-06-10 DIAGNOSIS — R5381 Other malaise: Secondary | ICD-10-CM | POA: Insufficient documentation

## 2012-06-10 DIAGNOSIS — R55 Syncope and collapse: Secondary | ICD-10-CM | POA: Insufficient documentation

## 2012-06-10 DIAGNOSIS — R51 Headache: Secondary | ICD-10-CM | POA: Insufficient documentation

## 2012-06-10 LAB — LIPASE, BLOOD: Lipase: 11 U/L (ref 11–59)

## 2012-06-10 LAB — CBC WITH DIFFERENTIAL/PLATELET
Eosinophils Absolute: 0.2 10*3/uL (ref 0.0–0.7)
HCT: 37.6 % (ref 36.0–46.0)
Hemoglobin: 13.2 g/dL (ref 12.0–15.0)
Lymphs Abs: 2 10*3/uL (ref 0.7–4.0)
MCH: 29.9 pg (ref 26.0–34.0)
Monocytes Absolute: 0.8 10*3/uL (ref 0.1–1.0)
Monocytes Relative: 7 % (ref 3–12)
Neutrophils Relative %: 73 % (ref 43–77)
RBC: 4.42 MIL/uL (ref 3.87–5.11)

## 2012-06-10 LAB — COMPREHENSIVE METABOLIC PANEL
Alkaline Phosphatase: 97 U/L (ref 39–117)
BUN: 17 mg/dL (ref 6–23)
Chloride: 106 mEq/L (ref 96–112)
GFR calc Af Amer: >90 mL/min (ref >90)
Glucose, Bld: 135 mg/dL — ABNORMAL HIGH (ref 70–99)
Potassium: 4.4 mEq/L (ref 3.5–5.1)
Total Bilirubin: 0.3 mg/dL (ref 0.3–1.2)
Total Protein: 6.7 g/dL (ref 6.0–8.3)

## 2012-06-10 MED ORDER — ONDANSETRON HCL 4 MG/2ML IJ SOLN
INTRAMUSCULAR | Status: AC
  Filled 2012-06-10: qty 2

## 2012-06-10 MED ORDER — SODIUM CHLORIDE 0.9 % IV BOLUS (SEPSIS)
1000.0000 mL | Freq: Once | INTRAVENOUS | Status: AC
  Administered 2012-06-10: 1000 mL via INTRAVENOUS

## 2012-06-10 MED ORDER — KETOROLAC TROMETHAMINE 30 MG/ML IJ SOLN
30.0000 mg | Freq: Once | INTRAMUSCULAR | Status: AC
  Administered 2012-06-10: 30 mg via INTRAVENOUS
  Filled 2012-06-10: qty 1

## 2012-06-10 MED ORDER — ONDANSETRON HCL 4 MG/2ML IJ SOLN
4.0000 mg | Freq: Once | INTRAMUSCULAR | Status: AC
  Administered 2012-06-10: 4 mg via INTRAVENOUS
  Filled 2012-06-10: qty 2

## 2012-06-10 NOTE — ED Provider Notes (Signed)
History     CSN: [id number]  Arrival date & time 06/10/12  0140   First MD Initiated Contact with Patient 06/10/12 0209      No chief complaint on file.   (Consider location/radiation/quality/duration/timing/severity/associated sxs/prior treatment) HPI Comments: Patient went to bed tonight, woke up shortly thereafter feeling nauseated and weak.  She went to the bathroom and vomited.  She then sat down on the toilet and shortly thereafter passed out on the floor. Her husband found her unconscious, but she woke shortly thereafter.    Patient is a 46 y.o. female presenting with syncope. The history is provided by the patient.  Loss of Consciousness This is a new problem. The current episode started 1 to 2 hours ago. Episode frequency: once. The problem has been resolved. Associated symptoms include headaches. Pertinent negatives include no chest pain, no abdominal pain and no shortness of breath. Nothing aggravates the symptoms. Nothing relieves the symptoms.    No past medical history on file.  No past surgical history on file.  No family history on file.  History  Substance Use Topics  . Smoking status: Not on file  . Smokeless tobacco: Not on file  . Alcohol Use: Not on file    OB History    No data available      Review of Systems  Respiratory: Negative for shortness of breath.   Cardiovascular: Positive for syncope. Negative for chest pain.  Gastrointestinal: Negative for abdominal pain.  Neurological: Positive for headaches.  All other systems reviewed and are negative.    Allergies  Review of patient's allergies indicates not on file.  Home Medications  No current outpatient prescriptions on file.  BP 93/59  Pulse 75  Temp 98 F (36.7 C) (Oral)  Resp 13  SpO2 100%  Physical Exam  Nursing note and vitals reviewed. Constitutional: She is oriented to person, place, and time. She appears well-developed and well-nourished. No distress.  HENT:  Head:  Normocephalic and atraumatic.  Neck: Normal range of motion. Neck supple.  Cardiovascular: Normal rate and regular rhythm.  Exam reveals no gallop and no friction rub.   No murmur heard. Pulmonary/Chest: Effort normal and breath sounds normal. No respiratory distress. She has no wheezes.  Abdominal: Soft. Bowel sounds are normal. She exhibits no distension. There is no tenderness.  Musculoskeletal: Normal range of motion.  Neurological: She is alert and oriented to person, place, and time.  Skin: Skin is warm and dry. She is not diaphoretic.    ED Course  Procedures (including critical care time)   Labs Reviewed  CBC WITH DIFFERENTIAL  COMPREHENSIVE METABOLIC PANEL  LIPASE, BLOOD   No results found.   No diagnosis found.   Date: 06/10/2012  Rate: 79  Rhythm: normal sinus rhythm  QRS Axis: normal  Intervals: normal  ST/T Wave abnormalities: normal  Conduction Disutrbances:none  Narrative Interpretation:   Old EKG Reviewed: unchanged    MDM  The patient presents with syncope after vomiting that sounds like a vagal mediated process.  The workup is unremarkable, including ekg, labs, ct.  I doubt seizure or cardiac arrhythmia.  She will be discharged to home, to return prn if her symptoms recur or worsen.        Geoffery Lyons, MD 06/10/12 [id number]

## 2012-06-10 NOTE — ED Notes (Signed)
Pt found by spouse unconscious on BR floor after nausea and vomiting. Initial vitals by EMS 88/78 HR 78 sinus, R-16 regular. 4mg  zofran given by EMS.

## 2012-08-10 ENCOUNTER — Emergency Department (INDEPENDENT_AMBULATORY_CARE_PROVIDER_SITE_OTHER)
Admission: EM | Admit: 2012-08-10 | Discharge: 2012-08-10 | Disposition: A | Discharge status: Other Acute Inpatient Hospital | Payer: Managed Care, Other (non HMO) | Source: Home / Self Care | Attending: Emergency Medicine | Admitting: Emergency Medicine

## 2012-08-10 ENCOUNTER — Encounter (HOSPITAL_COMMUNITY): Payer: Self-pay

## 2012-08-10 ENCOUNTER — Emergency Department (HOSPITAL_COMMUNITY)
Admission: EM | Admit: 2012-08-10 | Discharge: 2012-08-11 | Disposition: A | Discharge status: Home / Self Care | Payer: Managed Care, Other (non HMO) | Attending: Emergency Medicine | Admitting: Emergency Medicine

## 2012-08-10 ENCOUNTER — Emergency Department (HOSPITAL_COMMUNITY): Payer: Managed Care, Other (non HMO)

## 2012-08-10 ENCOUNTER — Encounter (HOSPITAL_COMMUNITY): Payer: Self-pay | Admitting: *Deleted

## 2012-08-10 DIAGNOSIS — K529 Noninfective gastroenteritis and colitis, unspecified: Secondary | ICD-10-CM

## 2012-08-10 DIAGNOSIS — K5289 Other specified noninfective gastroenteritis and colitis: Secondary | ICD-10-CM | POA: Insufficient documentation

## 2012-08-10 LAB — COMPREHENSIVE METABOLIC PANEL
AST: 17 U/L (ref 0–37)
Albumin: 3.3 g/dL — ABNORMAL LOW (ref 3.5–5.2)
Alkaline Phosphatase: 100 U/L (ref 39–117)
Chloride: 106 mEq/L (ref 96–112)
Potassium: 3.8 mEq/L (ref 3.5–5.1)
Total Bilirubin: 0.3 mg/dL (ref 0.3–1.2)

## 2012-08-10 LAB — CBC WITH DIFFERENTIAL/PLATELET
Basophils Absolute: 0 10*3/uL (ref 0.0–0.1)
Basophils Relative: 0 % (ref 0–1)
MCHC: 34.8 g/dL (ref 30.0–36.0)
Neutro Abs: 12.6 10*3/uL — ABNORMAL HIGH (ref 1.7–7.7)
Neutrophils Relative %: 86 % — ABNORMAL HIGH (ref 43–77)
RDW: 14.2 % (ref 11.5–15.5)

## 2012-08-10 LAB — PREGNANCY, URINE: Preg Test, Ur: NEGATIVE

## 2012-08-10 MED ORDER — IOHEXOL 300 MG/ML  SOLN: 20.0000 mL | INTRAMUSCULAR | Status: AC

## 2012-08-10 MED ORDER — HYDROMORPHONE HCL PF 1 MG/ML IJ SOLN
1.0000 mg | Freq: Once | INTRAMUSCULAR | Status: AC
  Administered 2012-08-10: 1 mg via INTRAVENOUS
  Filled 2012-08-10: qty 1

## 2012-08-10 MED ORDER — LORAZEPAM 2 MG/ML IJ SOLN
1.0000 mg | Freq: Once | INTRAMUSCULAR | Status: AC
  Administered 2012-08-10: 1 mg via INTRAVENOUS
  Filled 2012-08-10: qty 1

## 2012-08-10 MED ORDER — ONDANSETRON HCL 4 MG/2ML IJ SOLN
4.0000 mg | Freq: Once | INTRAMUSCULAR | Status: AC
  Administered 2012-08-10: 4 mg via INTRAVENOUS
  Filled 2012-08-10: qty 2

## 2012-08-10 MED ORDER — SODIUM CHLORIDE 0.9 % IV SOLN
INTRAVENOUS | Status: DC
  Administered 2012-08-10: 19:00:00 via INTRAVENOUS

## 2012-08-10 MED ORDER — SODIUM CHLORIDE 0.9 % IV BOLUS (SEPSIS)
1000.0000 mL | Freq: Once | INTRAVENOUS | Status: AC
  Administered 2012-08-10: 1000 mL via INTRAVENOUS

## 2012-08-10 MED ORDER — IOHEXOL 300 MG/ML  SOLN
100.0000 mL | Freq: Once | INTRAMUSCULAR | Status: AC | PRN
  Administered 2012-08-10: 100 mL via INTRAVENOUS

## 2012-08-10 NOTE — ED Notes (Signed)
Patient transported to CT 

## 2012-08-10 NOTE — ED Provider Notes (Signed)
History     CSN: [id number]  Arrival date & time 08/10/12  4098   First MD Initiated Contact with Patient 08/10/12 1923      Chief Complaint  Patient presents with  . Fever  . Diarrhea    (Consider location/radiation/quality/duration/timing/severity/associated sxs/prior treatment) The history is provided by the patient and medical records. No language interpreter was used.   46 year old female  presents with cc fever up to 103, shaking chills, generalized aching, weakness, malaise, and fatigue. Onset was 2 day ago, but patient has had intemittent loose stools all week. n Frequency increased to 10 to 13 loose, watery stools without blood or mucus. She complains of sever LLQ pain. States she has hx of IBS but no chronic constipation.She has nausea without vomiting. Marland Kitchenah She denies contacts with similar sxs, ingestion of suspect foods or water, history of similar sxs, recent foreign travel . History reviewed. No pertinent past medical history.  History reviewed. No pertinent past surgical history.  History reviewed. No pertinent family history.  History  Substance Use Topics  . Smoking status: Not on file  . Smokeless tobacco: Not on file  . Alcohol Use: Not on file    OB History    Grav Para Term Preterm Abortions TAB SAB Ect Mult Living                  Review of Systems Ten systems are reviewed and are negative for acute change except as noted in the HPI  Allergies  Bactrim  Home Medications   Current Outpatient Rx  Name  Route  Sig  Dispense  Refill  . DESVENLAFAXINE SUCCINATE ER 50 MG PO TB24   Oral   Take 50 mg by mouth daily.         Marland Kitchen DIAZEPAM 2 MG PO TABS   Oral   Take 2 mg by mouth at bedtime.         Marland Kitchen LEVOTHYROXINE SODIUM 137 MCG PO TABS   Oral   Take 137 mcg by mouth daily.         Marland Kitchen ROPINIROLE HCL 2 MG PO TABS   Oral   Take 2 mg by mouth at bedtime.         . TRAZODONE HCL 150 MG PO TABS   Oral   Take 150 mg by mouth at bedtime.          Marland Kitchen VALACYCLOVIR HCL 500 MG PO TABS   Oral   Take 500 mg by mouth daily.           BP 114/74  Pulse 117  Temp 99.5 F (37.5 C) (Oral)  Resp 16  SpO2 98%  LMP 06/03/2012  Physical Exam  Nursing note and vitals reviewed. Constitutional: She is oriented to person, place, and time. She appears well-developed and well-nourished. She appears distressed (moderate distress).       Patient in active Rigors  HENT:  Head: Normocephalic and atraumatic.  Eyes: Conjunctivae normal are normal. No scleral icterus.  Neck: Normal range of motion.  Cardiovascular: Normal rate, regular rhythm and normal heart sounds.  Exam reveals no gallop and no friction rub.   No murmur heard.      Tachycardic  Pulmonary/Chest: Effort normal and breath sounds normal. No respiratory distress.  Abdominal: Soft. Bowel sounds are normal. She exhibits no distension and no mass. There is tenderness (exquisitely tender to palpation of the left lower quadrant).  Neurological: She is alert and oriented to person, place, and  time. She has normal reflexes.  Skin: Skin is warm and dry. She is not diaphoretic.    ED Course  Procedures (including critical care time)  Labs Reviewed  CBC WITH DIFFERENTIAL - Abnormal; Notable for the following:    WBC 14.7 (*)     Neutrophils Relative 86 (*)     Neutro Abs 12.6 (*)     Lymphocytes Relative 9 (*)     All other components within normal limits  COMPREHENSIVE METABOLIC PANEL - Abnormal; Notable for the following:    Glucose, Bld 114 (*)     Albumin 3.3 (*)     GFR calc non Af Amer 72 (*)     GFR calc Af Amer 84 (*)     All other components within normal limits  URINALYSIS, ROUTINE W REFLEX MICROSCOPIC - Abnormal; Notable for the following:    APPearance CLOUDY (*)     Hgb urine dipstick MODERATE (*)     Leukocytes, UA LARGE (*)     All other components within normal limits  URINE MICROSCOPIC-ADD ON - Abnormal; Notable for the following:    Squamous Epithelial  / LPF FEW (*)     Bacteria, UA MANY (*)     All other components within normal limits  PREGNANCY, URINE  URINE CULTURE   Ct Abdomen Pelvis W Contrast  08/11/2012  *RADIOLOGY REPORT*  Clinical Data: Abdominal pain and fever for 1 day.  Diarrhea and nausea.  The  CT ABDOMEN AND PELVIS WITH CONTRAST  Technique:  Multidetector CT imaging of the abdomen and pelvis was performed following the standard protocol during bolus administration of intravenous contrast.  Contrast: OMNIPAQUE IOHEXOL 300 MG/ML  SOLN  Comparison: 03/28/2008.  Findings: Atelectasis in the lung bases.  The liver, spleen, gallbladder, pancreas, adrenal glands, kidneys, abdominal aorta, and retroperitoneal lymph nodes are unremarkable. The stomach, small bowel, and colon are not abnormally distended. There is diffuse wall thickening of the colon.  Consider inflammatory bowel disease versus pseudomembranous colitis or other colitis.  The mesenteric artery and vein appear patent.  No free air or free fluid in the abdomen.  Pelvis:  The uterus appears to be surgically absent.  No abnormal adnexal masses.  Bladder wall is not thickened.  Nonspecific scarring anterior to the bladder with some tenting.  The this may be postoperative.  Midline anterior pelvic wall surgical scar is suggested. No free or loculated pelvic fluid collections.  No evidence of diverticulitis.  The appendix is not identified. Postoperative changes at L4-5 and L5-S1.  IMPRESSION: Diffuse wall thickening of the colon suggesting nonspecific colitis.   Original Report Authenticated By: Burman Nieves, M.D.      1. Colitis       MDM   BP 114/74  Pulse 117  Temp 99.5 F (37.5 C) (Oral)  Resp 16  SpO2 98%  LMP 06/03/2012  Patient with elevated pulse and an active rigors she appears mildly distressed.  I've gone labs and will get CT of the abdomen to rule out possible diverticulitis versus perforated diverticula.  Patient may also have  gastroenteritis  Filed Vitals:   08/10/12 1919 08/10/12 1950 08/10/12 2138 08/11/12 0103  BP: 119/75 114/74 125/81 116/78  Pulse: 123 117 122 113  Temp: 99.6 F (37.6 C) 99.5 F (37.5 C)  99.1 F (37.3 C)  TempSrc: Oral   Oral  Resp: 16 16 23    SpO2: 98% 98% 96% 96%    The patient has a colitis of nonspecific etiology.  I am moving her to CDU ffor ED observation She should receive a second round of ABX and fluids/pain control with reevaluation for discharge in the morning. I have made Dr. Norlene Campbell aware of the patient.     Arthor Captain, PA-C 08/11/12 [id number]

## 2012-08-10 NOTE — ED Notes (Signed)
Patient complains of abd pain, fever and diarrhea since yesterday.  Sent from [facility] .

## 2012-08-10 NOTE — ED Notes (Signed)
Patients husband: Kanon Colunga [phone]

## 2012-08-10 NOTE — ED Provider Notes (Signed)
Chief Complaint  Patient presents with  . Fever    History of Present Illness:   The patient is a 46 year old female who presents tonight with a two-day history of fever up to 103, shaking chills, generalized aching, weakness, malaise, and fatigue. She's had anywhere from 10 to 13 loose, watery stools without blood or mucus both yesterday and today. She has crampy, mid abdominal pain and nausea but no vomiting. She denies any nasal congestion or sore throat. She has had some aching in her ears and a dry cough. She denies nasal congestion. She's not been around anyone with similar symptoms. She denies any suspicious ingestions, foreign travel, or animal exposure. She's allergic to Bactrim. She takes Synthroid, Requip for restless leg syndrome, trazodone, Valtrex, Valium, and prostatic. She has hypothyroidism.  Review of Systems:  Other than noted above, the patient denies any of the following symptoms: Systemic:  No fevers, chills, sweats, weight loss or gain, fatigue, or tiredness. ENT:  No nasal congestion, rhinorrhea, or sore throat. Lungs:  No cough, wheezing, or shortness of breath. Cardiac:  No chest pain, syncope, or presyncope. GI:  No abdominal pain, nausea, vomiting, anorexia, diarrhea, constipation, blood in stool or vomitus. GU:  No dysuria, frequency, or urgency.  PMFSH:  Past medical history, family history, social history, meds, and allergies were reviewed.  Physical Exam:   Vital signs:  BP 124/77  Pulse 125  Temp 98.2 F (36.8 C) (Oral)  Resp 20  SpO2 99% General:  Alert and oriented.  In no distress.  Skin warm and dry.  Good skin turgor, brisk capillary refill. She appears uncomfortable, has shaking chills, but is alert and oriented. ENT:  No scleral icterus, moist mucous membranes, no oral lesions, pharynx clear. Lungs:  Breath sounds clear and equal bilaterally.  No wheezes, rales, or rhonchi. Heart:  Rhythm regular, without extrasystoles.  No gallops or  murmers. Abdomen:  Soft, flat, nondistended. There is mild, generalized tenderness to palpation without guarding or rebound. No organomegaly or mass. Bowel sounds are not heard. Skin: Clear, warm, and dry.  Good turgor.  Brisk capillary refill.   Course in Urgent Care Center:   She was begun on IV normal saline at 150 mL per hour and will be transferred to the emergency department via [facility].  Assessment:  The encounter diagnosis was Gastroenteritis.  My suspicion is that she has a bacterial gastroenteritis and is mildly dehydrated.  Plan:   1.  The following meds were prescribed:   New Prescriptions   No medications on file   2.  The patient was transferred to the emergency department via [facility] in stable condition.    Reuben Likes, MD 08/10/12 [phone]

## 2012-08-10 NOTE — ED Notes (Signed)
CT notified that Oral contrast is consumed

## 2012-08-10 NOTE — ED Notes (Signed)
C/o abdominal pain and fever

## 2012-08-10 NOTE — ED Notes (Signed)
MAin ED advised of pending transfer; called [facility] , no truck available , called [facility] , will send BLS  truck ASAP . IV started w/o difficulty left forearm

## 2012-08-11 LAB — URINALYSIS, ROUTINE W REFLEX MICROSCOPIC
Bilirubin Urine: NEGATIVE
Ketones, ur: NEGATIVE mg/dL
Nitrite: NEGATIVE
pH: 5.5 (ref 5.0–8.0)

## 2012-08-11 LAB — URINE MICROSCOPIC-ADD ON

## 2012-08-11 MED ORDER — OXYCODONE-ACETAMINOPHEN 5-325 MG PO TABS: 1.0000 | ORAL_TABLET | Freq: Four times a day (QID) | ORAL | Status: DC | PRN

## 2012-08-11 MED ORDER — HYDROMORPHONE HCL PF 1 MG/ML IJ SOLN: 1.0000 mg | Freq: Once | INTRAMUSCULAR | Status: DC

## 2012-08-11 MED ORDER — METRONIDAZOLE IN NACL 5-0.79 MG/ML-% IV SOLN
500.0000 mg | Freq: Three times a day (TID) | INTRAVENOUS | Status: DC
  Administered 2012-08-11 (×2): 500 mg via INTRAVENOUS
  Filled 2012-08-11 (×2): qty 100

## 2012-08-11 MED ORDER — CIPROFLOXACIN HCL 500 MG PO TABS: 500.0000 mg | ORAL_TABLET | Freq: Two times a day (BID) | ORAL | Status: DC

## 2012-08-11 MED ORDER — SODIUM CHLORIDE 0.9 % IV SOLN: Freq: Once | INTRAVENOUS | Status: DC

## 2012-08-11 MED ORDER — METRONIDAZOLE 500 MG PO TABS: 500.0000 mg | ORAL_TABLET | Freq: Two times a day (BID) | ORAL | Status: DC

## 2012-08-11 MED ORDER — ONDANSETRON 4 MG PO TBDP: 4.0000 mg | ORAL_TABLET | Freq: Four times a day (QID) | ORAL | Status: DC | PRN

## 2012-08-11 MED ORDER — CIPROFLOXACIN IN D5W 400 MG/200ML IV SOLN
400.0000 mg | Freq: Once | INTRAVENOUS | Status: AC
  Administered 2012-08-11: 400 mg via INTRAVENOUS
  Filled 2012-08-11: qty 200

## 2012-08-11 MED ORDER — HYDROMORPHONE HCL PF 1 MG/ML IJ SOLN
1.0000 mg | INTRAMUSCULAR | Status: DC | PRN
  Administered 2012-08-11 (×2): 1 mg via INTRAVENOUS
  Filled 2012-08-11 (×2): qty 1

## 2012-08-11 MED ORDER — ONDANSETRON 4 MG PO TBDP
4.0000 mg | ORAL_TABLET | ORAL | Status: DC
  Administered 2012-08-11: 4 mg via ORAL
  Filled 2012-08-11: qty 1

## 2012-08-11 NOTE — ED Provider Notes (Signed)
Medical screening examination/treatment/procedure(s) were performed by non-physician practitioner and as supervising physician I was immediately available for consultation/collaboration.   Laray Anger, DO 08/11/12 2146

## 2012-08-11 NOTE — ED Notes (Signed)
Pt transported to CDU with tech. Pt stable. No signs of distress noted

## 2012-08-11 NOTE — ED Notes (Signed)
Pt eating brkfst.

## 2012-08-11 NOTE — ED Provider Notes (Signed)
8:00 AM Patient with no sig PMHx was placed in CDU on dehydration protocol by [facility]. Patient care resumed from PodB/D.  Patient is here for IV abc & pain medications and has received her first dose of Cipro & Flagyl at 3am. While in obeservation over night the pt slept well and had no complaints, per nursing staff. Patient re-evaluated and was able to tolerate breakfast, however her abd pain has returned. Plan per previous provider is to dc pt w pain medication, abx, & GI f-u.. BP 102/48  Pulse 59  Temp 99 F (37.2 C) (Oral)  Resp 16  SpO2 98%  LMP 06/03/2012  On exam: hemodynamically stable, NAD, heart w/ RRR, lungs CTAB, mild diffuse abdominal ttp, no peripheral edema or calf tenderness.  11:00 AM  Second abx started, Pt reports no current abdominal pain. Tolerating PO.   12:17 PM   At this time there does not appear to be any evidence of an acute emergency medical condition and the patient appears stable for discharge with appropriate outpatient follow up.Diagnosis of colitis was discussed with patient who verbalizes understanding and is agreeable to discharge. Pt case discussed with Dr. Jeraldine Loots who agrees with previous provider plan to dc w abx, pain meds & GI follow up         Jaci Carrel, PA-C 08/11/12 1218

## 2012-08-11 NOTE — ED Provider Notes (Signed)
Medical screening examination/treatment/procedure(s) were performed by non-physician practitioner and as supervising physician I was immediately available for consultation/collaboration.  Raeford Razor, MD 08/11/12 1455

## 2012-08-11 NOTE — ED Notes (Signed)
Req sent down for Lab add on Urine analysis.

## 2012-08-11 NOTE — ED Notes (Signed)
Next dose Flagyl 1100.

## 2012-08-11 NOTE — ED Notes (Signed)
MD at bedside. 

## 2012-08-12 LAB — URINE CULTURE: Colony Count: 15000

## 2012-09-24 ENCOUNTER — Emergency Department (HOSPITAL_COMMUNITY)
Admission: EM | Admit: 2012-09-24 | Discharge: 2012-09-24 | Disposition: A | Discharge status: Home / Self Care | Payer: Managed Care, Other (non HMO) | Attending: Emergency Medicine | Admitting: Emergency Medicine

## 2012-09-24 ENCOUNTER — Emergency Department (HOSPITAL_COMMUNITY): Payer: Managed Care, Other (non HMO)

## 2012-09-24 DIAGNOSIS — R197 Diarrhea, unspecified: Secondary | ICD-10-CM | POA: Insufficient documentation

## 2012-09-24 DIAGNOSIS — R109 Unspecified abdominal pain: Secondary | ICD-10-CM | POA: Insufficient documentation

## 2012-09-24 LAB — COMPREHENSIVE METABOLIC PANEL
ALT: 25 U/L (ref 0–35)
AST: 21 U/L (ref 0–37)
Albumin: 3.9 g/dL (ref 3.5–5.2)
Alkaline Phosphatase: 101 U/L (ref 39–117)
BUN: 21 mg/dL (ref 6–23)
CO2: 23 mEq/L (ref 19–32)
Calcium: 9.1 mg/dL (ref 8.4–10.5)
Chloride: 101 mEq/L (ref 96–112)
Creatinine, Ser: 0.8 mg/dL (ref 0.50–1.10)
GFR calc Af Amer: >90 mL/min (ref >90)
GFR calc non Af Amer: 88 mL/min — ABNORMAL LOW (ref >90)
Glucose, Bld: 93 mg/dL (ref 70–99)
Potassium: 4 mEq/L (ref 3.5–5.1)
Sodium: 135 mEq/L (ref 135–145)
Total Bilirubin: 0.4 mg/dL (ref 0.3–1.2)
Total Protein: 7.2 g/dL (ref 6.0–8.3)

## 2012-09-24 LAB — URINALYSIS, ROUTINE W REFLEX MICROSCOPIC
Bilirubin Urine: NEGATIVE
Glucose, UA: NEGATIVE mg/dL
Ketones, ur: NEGATIVE mg/dL
Nitrite: NEGATIVE
Protein, ur: NEGATIVE mg/dL
Specific Gravity, Urine: 1.026 (ref 1.005–1.030)
Urobilinogen, UA: 0.2 mg/dL (ref 0.0–1.0)
pH: 5.5 (ref 5.0–8.0)

## 2012-09-24 LAB — CBC
HCT: 40.9 % (ref 36.0–46.0)
Hemoglobin: 14.2 g/dL (ref 12.0–15.0)
MCH: 29.7 pg (ref 26.0–34.0)
MCHC: 34.7 g/dL (ref 30.0–36.0)
MCV: 85.6 fL (ref 78.0–100.0)
Platelets: 229 10*3/uL (ref 150–400)
RBC: 4.78 MIL/uL (ref 3.87–5.11)
RDW: 13.6 % (ref 11.5–15.5)
WBC: 11.9 10*3/uL — ABNORMAL HIGH (ref 4.0–10.5)

## 2012-09-24 LAB — URINE MICROSCOPIC-ADD ON

## 2012-09-24 MED ORDER — ONDANSETRON HCL 4 MG/2ML IJ SOLN: 4.0000 mg | Freq: Once | INTRAMUSCULAR | Status: DC

## 2012-09-24 MED ORDER — SODIUM CHLORIDE 0.9 % IV BOLUS (SEPSIS)
500.0000 mL | Freq: Once | INTRAVENOUS | Status: AC
  Administered 2012-09-24: 500 mL via INTRAVENOUS

## 2012-09-24 MED ORDER — ONDANSETRON HCL 4 MG/2ML IJ SOLN
4.0000 mg | Freq: Once | INTRAMUSCULAR | Status: AC
  Administered 2012-09-24: 4 mg via INTRAVENOUS
  Filled 2012-09-24: qty 2

## 2012-09-24 MED ORDER — KETOROLAC TROMETHAMINE 30 MG/ML IJ SOLN
30.0000 mg | Freq: Once | INTRAMUSCULAR | Status: AC
  Administered 2012-09-24: 30 mg via INTRAVENOUS
  Filled 2012-09-24: qty 1

## 2012-09-24 MED ORDER — HYDROCODONE-ACETAMINOPHEN 5-325 MG PO TABS: 2.0000 | ORAL_TABLET | ORAL | Status: DC | PRN

## 2012-09-24 MED ORDER — HYDROMORPHONE HCL PF 1 MG/ML IJ SOLN
1.0000 mg | Freq: Once | INTRAMUSCULAR | Status: AC
  Administered 2012-09-24: 1 mg via INTRAVENOUS
  Filled 2012-09-24: qty 1

## 2012-09-24 MED ORDER — SODIUM CHLORIDE 0.9 % IV BOLUS (SEPSIS)
1000.0000 mL | Freq: Once | INTRAVENOUS | Status: AC
  Administered 2012-09-24: 1000 mL via INTRAVENOUS

## 2012-09-24 MED ORDER — IOHEXOL 300 MG/ML  SOLN
50.0000 mL | Freq: Once | INTRAMUSCULAR | Status: AC | PRN
  Administered 2012-09-24: 50 mL via ORAL

## 2012-09-24 MED ORDER — IOHEXOL 300 MG/ML  SOLN
100.0000 mL | Freq: Once | INTRAMUSCULAR | Status: AC | PRN
  Administered 2012-09-24: 100 mL via INTRAVENOUS

## 2012-09-24 MED ORDER — MORPHINE SULFATE 4 MG/ML IJ SOLN
6.0000 mg | Freq: Once | INTRAMUSCULAR | Status: AC
  Administered 2012-09-24: 6 mg via INTRAVENOUS
  Filled 2012-09-24: qty 2

## 2012-09-24 MED ORDER — ONDANSETRON HCL 4 MG PO TABS: 4.0000 mg | ORAL_TABLET | Freq: Four times a day (QID) | ORAL | Status: DC

## 2012-09-24 NOTE — ED Notes (Signed)
Pt c/o nausea, diarrhea, body aches and fever since this morning. Pt also c/o LLQ pain since this morning. Pt states she had the same symptoms in December and went to [facility] and was diagnosed with colitis. Pt reports pain is sharp and dull and constant.

## 2012-09-24 NOTE — ED Provider Notes (Signed)
History     CSN: [id number]  Arrival date & time 09/24/12  1638   First MD Initiated Contact with Patient 09/24/12 1702      Chief Complaint  Patient presents with  . Abdominal Pain  . Diarrhea    (Consider location/radiation/quality/duration/timing/severity/associated sxs/prior treatment) HPI Patient presents to the emergency department complaining of diarrhea and severe left lower abdominal pain accompanied by fever and chills.  Patient states that her symptoms started earlier today.  Patient called her GI doctor advised her to come to the emergency room for patient was recently treated for colitis.  Patient denies chest pain, shortness of breath, headache, visual changes, dizziness, dysuria, rectal bleeding, vaginal bleeding, or vomiting.  Patient, states she had a fever at home.  Patient denies take any medications prior to arrival for her symptoms  No past medical history on file.  No past surgical history on file.  No family history on file.  History  Substance Use Topics  . Smoking status: Not on file  . Smokeless tobacco: Not on file  . Alcohol Use: Not on file    OB History    Grav Para Term Preterm Abortions TAB SAB Ect Mult Living                  Review of Systems All other systems negative except as documented in the HPI. All pertinent positives and negatives as reviewed in the HPI.  Allergies  Bactrim  Home Medications   Current Outpatient Rx  Name  Route  Sig  Dispense  Refill  . CIPROFLOXACIN HCL 500 MG PO TABS   Oral   Take 1 tablet (500 mg total) by mouth 2 (two) times daily.   14 tablet   0   . DESVENLAFAXINE SUCCINATE ER 50 MG PO TB24   Oral   Take 50 mg by mouth daily.         Marland Kitchen DIAZEPAM 2 MG PO TABS   Oral   Take 2 mg by mouth at bedtime.         Marland Kitchen LEVOTHYROXINE SODIUM 137 MCG PO TABS   Oral   Take 137 mcg by mouth daily.         Marland Kitchen METRONIDAZOLE 500 MG PO TABS   Oral   Take 1 tablet (500 mg total) by mouth 2 (two) times  daily.   14 tablet   0   . ONDANSETRON 4 MG PO TBDP   Oral   Take 1 tablet (4 mg total) by mouth every 6 (six) hours as needed for nausea.   6 tablet   0   . OXYCODONE-ACETAMINOPHEN 5-325 MG PO TABS   Oral   Take 1 tablet by mouth every 6 (six) hours as needed for pain.   15 tablet   0   . ROPINIROLE HCL 2 MG PO TABS   Oral   Take 2 mg by mouth at bedtime.         . TRAZODONE HCL 150 MG PO TABS   Oral   Take 150 mg by mouth at bedtime.         Marland Kitchen VALACYCLOVIR HCL 500 MG PO TABS   Oral   Take 500 mg by mouth daily.           BP 138/99  Pulse 122  Temp 99.8 F (37.7 C) (Oral)  Resp 17  SpO2 96%  LMP 06/03/2012  Physical Exam  Constitutional: She is oriented to person, place, and time. She  appears well-developed and well-nourished.  HENT:  Head: Normocephalic and atraumatic.  Mouth/Throat: Mucous membranes are not pale and dry (slightly dry mucous membranes).  Eyes: Conjunctivae normal are normal.       Sclera pink, eyes watery  Neck: Normal range of motion. Neck supple.  Cardiovascular: Regular rhythm and normal heart sounds.  Tachycardia present.  Exam reveals no gallop and no friction rub.   No murmur heard. Pulmonary/Chest: Effort normal and breath sounds normal.  Abdominal: Soft. Normal appearance and bowel sounds are normal. There is no hepatosplenomegaly. There is tenderness (exquisite tenderness to palpation in LLQ; RLQ pain radiates to LLQ) in the left lower quadrant. There is no rigidity, no rebound and no guarding.  Genitourinary: Rectal exam shows tenderness. Rectal exam shows no external hemorrhoid, no internal hemorrhoid, no fissure and anal tone normal. Guaiac negative stool.  Neurological: She is alert and oriented to person, place, and time.  Skin: Skin is warm.    ED Course  Procedures (including critical care time)  CT scan of the abdomen be done looking for an acute process.  Patient is stable at this time.   MDM          Carlyle Dolly, PA-C 09/24/12 2033

## 2012-09-24 NOTE — ED Provider Notes (Signed)
Agent originally seen by Thayer Ohm laywer [facility] for abdominal pain. Sent in by GI doctor to evaluate for possible repeat of colitis.   Her labs are normal. I will be following up on CT scan of abd/pelv.  Results for orders placed during the hospital encounter of 09/24/12  CBC      Component Value Range   WBC 11.9 (*) 4.0 - 10.5 K/uL   RBC 4.78  3.87 - 5.11 MIL/uL   Hemoglobin 14.2  12.0 - 15.0 g/dL   HCT 40.9  81.1 - 91.4 %   MCV 85.6  78.0 - 100.0 fL   MCH 29.7  26.0 - 34.0 pg   MCHC 34.7  30.0 - 36.0 g/dL   RDW 78.2  95.6 - 21.3 %   Platelets 229  150 - 400 K/uL  COMPREHENSIVE METABOLIC PANEL      Component Value Range   Sodium 135  135 - 145 mEq/L   Potassium 4.0  3.5 - 5.1 mEq/L   Chloride 101  96 - 112 mEq/L   CO2 23  19 - 32 mEq/L   Glucose, Bld 93  70 - 99 mg/dL   BUN 21  6 - 23 mg/dL   Creatinine, Ser 0.86  0.50 - 1.10 mg/dL   Calcium 9.1  8.4 - 57.8 mg/dL   Total Protein 7.2  6.0 - 8.3 g/dL   Albumin 3.9  3.5 - 5.2 g/dL   AST 21  0 - 37 U/L   ALT 25  0 - 35 U/L   Alkaline Phosphatase 101  39 - 117 U/L   Total Bilirubin 0.4  0.3 - 1.2 mg/dL   GFR calc non Af Amer 88 (*) >90 mL/min   GFR calc Af Amer >90  >90 mL/min  URINALYSIS, ROUTINE W REFLEX MICROSCOPIC      Component Value Range   Color, Urine YELLOW  YELLOW   APPearance CLOUDY (*) CLEAR   Specific Gravity, Urine 1.026  1.005 - 1.030   pH 5.5  5.0 - 8.0   Glucose, UA NEGATIVE  NEGATIVE mg/dL   Hgb urine dipstick SMALL (*) NEGATIVE   Bilirubin Urine NEGATIVE  NEGATIVE   Ketones, ur NEGATIVE  NEGATIVE mg/dL   Protein, ur NEGATIVE  NEGATIVE mg/dL   Urobilinogen, UA 0.2  0.0 - 1.0 mg/dL   Nitrite NEGATIVE  NEGATIVE   Leukocytes, UA SMALL (*) NEGATIVE  URINE MICROSCOPIC-ADD ON      Component Value Range   Squamous Epithelial / LPF MANY (*) RARE   WBC, UA 3-6  <3 WBC/hpf   RBC / HPF 0-2  <3 RBC/hpf   Bacteria, UA FEW (*) RARE   Ct Abdomen Pelvis W Contrast  09/24/2012  *RADIOLOGY REPORT*  Clinical Data: Nausea,  diarrhea, body aches and fever.  Left lower quadrant pain.  CT ABDOMEN AND PELVIS WITH CONTRAST  Technique:  Multidetector CT imaging of the abdomen and pelvis was performed following the standard protocol during bolus administration of intravenous contrast.  Contrast: OMNIPAQUE IOHEXOL 300 MG/ML  SOLN, 50mL OMNIPAQUE IOHEXOL 300 MG/ML  SOLN  Comparison: 08/10/2012.  Findings: No extraluminal bowel inflammatory process, free fluid or free air.  The appendix is not visualized and may have been resected at the time hysterectomy.  Streak artifact from spine surgery lower lumbar region.  No focal hepatic, splenic, renal, adrenal or pancreatic lesion.  No calcified gallstones.  Mild atherosclerotic type changes of the aorta and iliac arteries.  Small/top normal size peripancreatic nodes.  Partially decompressed urinary bladder without gross abnormality.  IMPRESSION: No extraluminal bowel inflammatory process, free fluid or free air. Appendix not visualized possibly removed at the time of hysterectomy.  Please see above.   Original Report Authenticated By: Lacy Duverney, M.D.     ' Pts CT abd/pelv show no significant findings. Pt passed fluid challenge in the ED. She is still having mild LLQ pain. No appendix noted on CT, most likely removed with hysterectomy. Pt is afebrile. Given another L of NS. She goes to [facility] and has been told to call Monday morning for close follow-up of return to the ED if symptoms change or worsen.  Rx:  Vicodin and Zofran  Pt has been advised of the symptoms that warrant their return to the ED. Patient has voiced understanding and has agreed to follow-up with the PCP or specialist.   Dorthula Matas, PA 09/24/12 2236

## 2012-09-24 NOTE — ED Notes (Signed)
Patient transported to CT 

## 2012-09-24 NOTE — ED Notes (Signed)
Note at the top of page under "[facility]".  Spoke with Dr. Effie Shy who created note and he advised to proceed with pt as normal ER pt.

## 2012-09-25 NOTE — ED Provider Notes (Signed)
Medical screening examination/treatment/procedure(s) were performed by non-physician practitioner and as supervising physician I was immediately available for consultation/collaboration.   Flint Melter, MD 09/25/12 0030

## 2012-09-25 NOTE — ED Provider Notes (Signed)
Medical screening examination/treatment/procedure(s) were performed by non-physician practitioner and as supervising physician I was immediately available for consultation/collaboration.   Flint Melter, MD 09/25/12 [phone]

## 2012-09-26 LAB — URINE CULTURE: Colony Count: 4000

## 2012-11-16 ENCOUNTER — Other Ambulatory Visit (HOSPITAL_COMMUNITY)
Admission: RE | Admit: 2012-11-16 | Discharge: 2012-11-16 | Disposition: A | Discharge status: Home / Self Care | Payer: Managed Care, Other (non HMO) | Source: Ambulatory Visit | Attending: Gynecology | Admitting: Gynecology

## 2012-11-16 ENCOUNTER — Encounter: Payer: Self-pay | Admitting: Gynecology

## 2012-11-16 ENCOUNTER — Ambulatory Visit (INDEPENDENT_AMBULATORY_CARE_PROVIDER_SITE_OTHER): Payer: Managed Care, Other (non HMO) | Admitting: Gynecology

## 2012-11-16 VITALS — BP 130/82 | Ht 66.0 in | Wt 186.0 lb

## 2012-11-16 DIAGNOSIS — Z01419 Encounter for gynecological examination (general) (routine) without abnormal findings: Secondary | ICD-10-CM | POA: Insufficient documentation

## 2012-11-16 DIAGNOSIS — Z1151 Encounter for screening for human papillomavirus (HPV): Secondary | ICD-10-CM | POA: Insufficient documentation

## 2012-11-16 DIAGNOSIS — Z1272 Encounter for screening for malignant neoplasm of vagina: Secondary | ICD-10-CM

## 2012-11-16 LAB — WET PREP FOR TRICH, YEAST, CLUE: Trich, Wet Prep: NONE SEEN

## 2012-11-16 MED ORDER — FLUCONAZOLE 100 MG PO TABS: ORAL_TABLET | ORAL | Status: DC

## 2012-11-16 NOTE — Patient Instructions (Addendum)
Monilial Vaginitis Vaginitis in a soreness, swelling and redness (inflammation) of the vagina and vulva. Monilial vaginitis is not a sexually transmitted infection. CAUSES  Yeast vaginitis is caused by yeast (candida) that is normally found in your vagina. With a yeast infection, the candida has overgrown in number to a point that upsets the chemical balance. SYMPTOMS   White, thick vaginal discharge.  Swelling, itching, redness and irritation of the vagina and possibly the lips of the vagina (vulva).  Burning or painful urination.  Painful intercourse. DIAGNOSIS  Things that may contribute to monilial vaginitis are:  Postmenopausal and virginal states.  Pregnancy.  Infections.  Being tired, sick or stressed, especially if you had monilial vaginitis in the past.  Diabetes. Good control will help lower the chance.  Birth control pills.  Tight fitting garments.  Using bubble bath, feminine sprays, douches or deodorant tampons.  Taking certain medications that kill germs (antibiotics).  Sporadic recurrence can occur if you become ill. TREATMENT  Your caregiver will give you medication.  There are several kinds of anti monilial vaginal creams and suppositories specific for monilial vaginitis. For recurrent yeast infections, use a suppository or cream in the vagina 2 times a week, or as directed.  Anti-monilial or steroid cream for the itching or irritation of the vulva may also be used. Get your caregiver's permission.  Painting the vagina with methylene blue solution may help if the monilial cream does not work.  Eating yogurt may help prevent monilial vaginitis. HOME CARE INSTRUCTIONS   Finish all medication as prescribed.  Do not have sex until treatment is completed or after your caregiver tells you it is okay.  Take warm sitz baths.  Do not douche.  Do not use tampons, especially scented ones.  Wear cotton underwear.  Avoid tight pants and panty  hose.  Tell your sexual partner that you have a yeast infection. They should go to their caregiver if they have symptoms such as mild rash or itching.  Your sexual partner should be treated as well if your infection is difficult to eliminate.  Practice safer sex. Use condoms.  Some vaginal medications cause latex condoms to fail. Vaginal medications that harm condoms are:  Cleocin cream.  Butoconazole (Femstat).  Terconazole (Terazol) vaginal suppository.  Miconazole (Monistat) (may be purchased over the counter). SEEK MEDICAL CARE IF:   You have a temperature by mouth above 102 F (38.9 C).  The infection is getting worse after 2 days of treatment.  The infection is not getting better after 3 days of treatment.  You develop blisters in or around your vagina.  You develop vaginal bleeding, and it is not your menstrual period.  You have pain when you urinate.  You develop intestinal problems.  You have pain with sexual intercourse. Document Released: 05/27/2005 Document Revised: 11/09/2011 Document Reviewed: 02/08/2009 Exit[employer] Patient Information 2013 [employer].  

## 2012-11-16 NOTE — Progress Notes (Signed)
Patient is a 47 year old who presented to the office today for the first time since 2011. Patient had been living in [place] and stated her last Pap smear was in 2012. She presented to the office today complaining of a vaginal odor pinkish discharge and stress urinary incontinence. Patient with past history of abdominal hysterectomy in 2 years later patient had a suburethral sling placed by Dr. Foy Guadalajara (urologist). Patient is in a monogamous relationship. She denied any dysuria or frequency. Patient does suffer from irritable bowel syndrome.  Exam: Bartholin urethra Skene glands: Patient appears to have erosion of the suburethral sling whereby part of the graft is seen. The rest of the vagina was intact vaginal cuff was intact and a Pap smear was done. Bimanual examination did not palpate any adnexal masses.  Wet prep: Evidence of yeast   Assessment/plan: Patient with erosion of suburethral sling from previous bladder suspension approximately 7 years ago. I have contacted Dr. Wilfred Curtis office and they will contact her and to see her within the next day or so for followup. She was given a prescription Diflucan 150 mg to take 1 by mouth today. She was instructed to refrain from any intercourse. Requisition to schedule mammogram was provided. Her primary physician is doing her exams and her lab work.

## 2012-11-17 ENCOUNTER — Telehealth: Payer: Self-pay | Admitting: *Deleted

## 2012-11-17 NOTE — Telephone Encounter (Signed)
Pt appointment with Dr.Grapey at 11:30 am on 11/18/12 notes faxed to office.

## 2012-11-18 ENCOUNTER — Telehealth: Payer: Self-pay | Admitting: *Deleted

## 2012-11-18 MED ORDER — FLUCONAZOLE 100 MG PO TABS: ORAL_TABLET | ORAL | Status: DC

## 2012-11-18 NOTE — Telephone Encounter (Signed)
Pt was seen on 11/16/12 and rx for diflucan 100 mg was sent to incorrect pharmacy. rx will be sent to [facility].

## 2012-11-23 ENCOUNTER — Encounter (HOSPITAL_BASED_OUTPATIENT_CLINIC_OR_DEPARTMENT_OTHER): Payer: Self-pay | Admitting: *Deleted

## 2012-11-23 ENCOUNTER — Other Ambulatory Visit: Payer: Self-pay | Admitting: Urology

## 2012-11-24 ENCOUNTER — Encounter (HOSPITAL_BASED_OUTPATIENT_CLINIC_OR_DEPARTMENT_OTHER): Payer: Self-pay | Admitting: *Deleted

## 2012-11-24 NOTE — Progress Notes (Signed)
NPO AFTER MN. ARRIVES AT 0815. NEEDS HG. WILL TAKE PRILOSEC AND SYNTHROID AM OF SURG W/ SIP OF WATER.

## 2012-11-30 ENCOUNTER — Encounter (HOSPITAL_BASED_OUTPATIENT_CLINIC_OR_DEPARTMENT_OTHER): Payer: Self-pay | Admitting: *Deleted

## 2012-11-30 ENCOUNTER — Ambulatory Visit (HOSPITAL_BASED_OUTPATIENT_CLINIC_OR_DEPARTMENT_OTHER): Payer: Managed Care, Other (non HMO) | Admitting: Certified Registered"

## 2012-11-30 ENCOUNTER — Encounter (HOSPITAL_BASED_OUTPATIENT_CLINIC_OR_DEPARTMENT_OTHER)
Admission: RE | Disposition: A | Discharge status: Home / Self Care | Payer: Self-pay | Source: Ambulatory Visit | Attending: Urology

## 2012-11-30 ENCOUNTER — Encounter (HOSPITAL_BASED_OUTPATIENT_CLINIC_OR_DEPARTMENT_OTHER): Payer: Self-pay | Admitting: Certified Registered"

## 2012-11-30 ENCOUNTER — Ambulatory Visit (HOSPITAL_BASED_OUTPATIENT_CLINIC_OR_DEPARTMENT_OTHER)
Admission: RE | Admit: 2012-11-30 | Discharge: 2012-11-30 | Disposition: A | Discharge status: Home / Self Care | Payer: Managed Care, Other (non HMO) | Source: Ambulatory Visit | Attending: Urology | Admitting: Urology

## 2012-11-30 DIAGNOSIS — N3010 Interstitial cystitis (chronic) without hematuria: Secondary | ICD-10-CM | POA: Insufficient documentation

## 2012-11-30 DIAGNOSIS — E039 Hypothyroidism, unspecified: Secondary | ICD-10-CM | POA: Insufficient documentation

## 2012-11-30 DIAGNOSIS — F3289 Other specified depressive episodes: Secondary | ICD-10-CM | POA: Insufficient documentation

## 2012-11-30 DIAGNOSIS — K219 Gastro-esophageal reflux disease without esophagitis: Secondary | ICD-10-CM | POA: Insufficient documentation

## 2012-11-30 DIAGNOSIS — N2889 Other specified disorders of kidney and ureter: Secondary | ICD-10-CM | POA: Insufficient documentation

## 2012-11-30 DIAGNOSIS — T8389XA Other specified complication of genitourinary prosthetic devices, implants and grafts, initial encounter: Secondary | ICD-10-CM | POA: Insufficient documentation

## 2012-11-30 DIAGNOSIS — T859XXA Unspecified complication of internal prosthetic device, implant and graft, initial encounter: Secondary | ICD-10-CM

## 2012-11-30 DIAGNOSIS — F329 Major depressive disorder, single episode, unspecified: Secondary | ICD-10-CM | POA: Insufficient documentation

## 2012-11-30 DIAGNOSIS — Z79899 Other long term (current) drug therapy: Secondary | ICD-10-CM | POA: Insufficient documentation

## 2012-11-30 DIAGNOSIS — Z87891 Personal history of nicotine dependence: Secondary | ICD-10-CM | POA: Insufficient documentation

## 2012-11-30 DIAGNOSIS — T83711A Erosion of implanted vaginal mesh and other prosthetic materials to surrounding organ or tissue, initial encounter: Secondary | ICD-10-CM | POA: Insufficient documentation

## 2012-11-30 DIAGNOSIS — Y831 Surgical operation with implant of artificial internal device as the cause of abnormal reaction of the patient, or of later complication, without mention of misadventure at the time of the procedure: Secondary | ICD-10-CM | POA: Insufficient documentation

## 2012-11-30 HISTORY — DX: Stress incontinence (female) (male): N39.3

## 2012-11-30 HISTORY — PX: PUBOVAGINAL SLING: SHX1035

## 2012-11-30 HISTORY — DX: Other allergy status, other than to drugs and biological substances: Z91.09

## 2012-11-30 HISTORY — DX: Herpesviral infection, unspecified: B00.9

## 2012-11-30 HISTORY — DX: Nocturia: R35.1

## 2012-11-30 HISTORY — DX: Frequency of micturition: R35.0

## 2012-11-30 HISTORY — DX: Irritable bowel syndrome, unspecified: K58.9

## 2012-11-30 HISTORY — DX: Hypothyroidism, unspecified: E03.9

## 2012-11-30 HISTORY — DX: Erosion of implanted urethral mesh to surrounding organ or tissue, initial encounter: T83.712A

## 2012-11-30 HISTORY — PX: CYSTO WITH HYDRODISTENSION: SHX5453

## 2012-11-30 LAB — POCT HEMOGLOBIN-HEMACUE: Hemoglobin: 12.6 g/dL (ref 12.0–15.0)

## 2012-11-30 SURGERY — CREATION, PUBOVAGINAL SLING
Anesthesia: General | Site: Vagina | Wound class: Clean Contaminated

## 2012-11-30 MED ORDER — LACTATED RINGERS IV SOLN
INTRAVENOUS | Status: DC
  Filled 2012-11-30: qty 1000

## 2012-11-30 MED ORDER — URIBEL 118 MG PO CAPS: 1.0000 | ORAL_CAPSULE | Freq: Four times a day (QID) | ORAL | Status: DC | PRN

## 2012-11-30 MED ORDER — CEFAZOLIN SODIUM-DEXTROSE 2-3 GM-% IV SOLR
2.0000 g | INTRAVENOUS | Status: AC
  Administered 2012-11-30: 2 g via INTRAVENOUS
  Filled 2012-11-30: qty 50

## 2012-11-30 MED ORDER — EPHEDRINE SULFATE 50 MG/ML IJ SOLN
INTRAMUSCULAR | Status: DC | PRN
  Administered 2012-11-30: 10 mg via INTRAVENOUS

## 2012-11-30 MED ORDER — ESTRADIOL 0.1 MG/GM VA CREA
TOPICAL_CREAM | VAGINAL | Status: DC | PRN
  Administered 2012-11-30: 1 via VAGINAL

## 2012-11-30 MED ORDER — FENTANYL CITRATE 0.05 MG/ML IJ SOLN
INTRAMUSCULAR | Status: DC | PRN
  Administered 2012-11-30 (×2): 50 ug via INTRAVENOUS

## 2012-11-30 MED ORDER — LIDOCAINE-EPINEPHRINE 0.5 %-1:200000 IJ SOLN
INTRAMUSCULAR | Status: DC | PRN
  Administered 2012-11-30: 10 mL

## 2012-11-30 MED ORDER — CEFAZOLIN SODIUM 1-5 GM-% IV SOLN
1.0000 g | INTRAVENOUS | Status: DC
  Filled 2012-11-30: qty 50

## 2012-11-30 MED ORDER — PROMETHAZINE HCL 25 MG/ML IJ SOLN
6.2500 mg | INTRAMUSCULAR | Status: DC | PRN
  Filled 2012-11-30: qty 1

## 2012-11-30 MED ORDER — ONDANSETRON HCL 4 MG/2ML IJ SOLN
INTRAMUSCULAR | Status: DC | PRN
  Administered 2012-11-30: 4 mg via INTRAVENOUS

## 2012-11-30 MED ORDER — SUCCINYLCHOLINE CHLORIDE 20 MG/ML IJ SOLN
INTRAMUSCULAR | Status: DC | PRN
  Administered 2012-11-30: 120 mg via INTRAVENOUS

## 2012-11-30 MED ORDER — LIDOCAINE HCL (CARDIAC) 20 MG/ML IV SOLN
INTRAVENOUS | Status: DC | PRN
  Administered 2012-11-30: 50 mg via INTRAVENOUS

## 2012-11-30 MED ORDER — PROPOFOL 10 MG/ML IV BOLUS
INTRAVENOUS | Status: DC | PRN
  Administered 2012-11-30: 200 mg via INTRAVENOUS

## 2012-11-30 MED ORDER — TRAMADOL HCL 50 MG PO TABS
50.0000 mg | ORAL_TABLET | Freq: Four times a day (QID) | ORAL | Status: DC | PRN
  Administered 2012-11-30: 50 mg via ORAL
  Filled 2012-11-30: qty 1

## 2012-11-30 MED ORDER — LACTATED RINGERS IV SOLN
INTRAVENOUS | Status: DC
  Administered 2012-11-30 (×2): via INTRAVENOUS
  Filled 2012-11-30: qty 1000

## 2012-11-30 MED ORDER — STERILE WATER FOR IRRIGATION IR SOLN
Status: DC | PRN
  Administered 2012-11-30: 3000 mL via INTRAVESICAL

## 2012-11-30 MED ORDER — TRAMADOL HCL 50 MG PO TABS: 50.0000 mg | ORAL_TABLET | Freq: Four times a day (QID) | ORAL | Status: DC | PRN

## 2012-11-30 MED ORDER — DEXAMETHASONE SODIUM PHOSPHATE 4 MG/ML IJ SOLN
INTRAMUSCULAR | Status: DC | PRN
  Administered 2012-11-30: 8 mg via INTRAVENOUS

## 2012-11-30 MED ORDER — ONDANSETRON HCL 4 MG PO TABS: 4.0000 mg | ORAL_TABLET | Freq: Three times a day (TID) | ORAL | Status: DC | PRN

## 2012-11-30 MED ORDER — MEPERIDINE HCL 25 MG/ML IJ SOLN
6.2500 mg | INTRAMUSCULAR | Status: DC | PRN
  Filled 2012-11-30: qty 1

## 2012-11-30 MED ORDER — SCOPOLAMINE 1 MG/3DAYS TD PT72
1.0000 | MEDICATED_PATCH | Freq: Once | TRANSDERMAL | Status: DC
  Administered 2012-11-30: 1.5 mg via TRANSDERMAL
  Filled 2012-11-30: qty 1

## 2012-11-30 SURGICAL SUPPLY — 43 items
ADH SKN CLS APL DERMABOND .7 (GAUZE/BANDAGES/DRESSINGS)
BAG DRAIN URO-CYSTO SKYTR STRL (DRAIN) ×3 IMPLANT
BAG URINE LEG 19OZ MD ST LTX (BAG) IMPLANT
BLADE SURG 10 STRL SS (BLADE) ×3 IMPLANT
BLADE SURG 15 STRL LF DISP TIS (BLADE) ×2 IMPLANT
BLADE SURG 15 STRL SS (BLADE) ×2
BLADE SURG ROTATE 9660 (MISCELLANEOUS) ×3 IMPLANT
CANISTER SUCT LVC 12 LTR MEDI- (MISCELLANEOUS) ×3 IMPLANT
CATH FOLEY 2WAY SLVR  5CC 16FR (CATHETERS) ×1
CATH FOLEY 2WAY SLVR 5CC 16FR (CATHETERS) ×2 IMPLANT
CATH ROBINSON RED A/P 14FR (CATHETERS) IMPLANT
CATH ROBINSON RED A/P 16FR (CATHETERS) IMPLANT
CATH ROBINSON RED A/P 18FR (CATHETERS) IMPLANT
CLOTH BEACON ORANGE TIMEOUT ST (SAFETY) ×3 IMPLANT
COVER MAYO STAND STRL (DRAPES) ×3 IMPLANT
DERMABOND ADVANCED (GAUZE/BANDAGES/DRESSINGS)
DERMABOND ADVANCED .7 DNX12 (GAUZE/BANDAGES/DRESSINGS) IMPLANT
DRAPE CAMERA CLOSED 9X96 (DRAPES) ×3 IMPLANT
ELECT REM PT RETURN 9FT ADLT (ELECTROSURGICAL) ×3
ELECTRODE REM PT RTRN 9FT ADLT (ELECTROSURGICAL) ×2 IMPLANT
FLOSEAL 10ML (HEMOSTASIS) IMPLANT
GLOVE BIO SURGEON STRL SZ 6.5 (GLOVE) ×6 IMPLANT
GLOVE BIO SURGEON STRL SZ7.5 (GLOVE) ×3 IMPLANT
GLOVE ECLIPSE 6.0 STRL STRAW (GLOVE) ×3 IMPLANT
GOWN PREVENTION PLUS LG XLONG (DISPOSABLE) ×6 IMPLANT
GOWN STRL REIN XL XLG (GOWN DISPOSABLE) ×3 IMPLANT
HEMOSTAT SURGICEL 2X14 (HEMOSTASIS) IMPLANT
HEMOSTAT SURGICEL 4X8 (HEMOSTASIS) IMPLANT
NEEDLE HYPO 22GX1.5 SAFETY (NEEDLE) ×3 IMPLANT
NS IRRIG 500ML POUR BTL (IV SOLUTION) ×3 IMPLANT
PACK BASIN DAY SURGERY FS (CUSTOM PROCEDURE TRAY) ×3 IMPLANT
PACK CYSTOSCOPY (CUSTOM PROCEDURE TRAY) ×3 IMPLANT
PACKING VAGINAL (PACKING) ×3 IMPLANT
PENCIL BUTTON HOLSTER BLD 10FT (ELECTRODE) ×3 IMPLANT
PLUG CATH AND CAP STER (CATHETERS) ×3 IMPLANT
SUT VIC AB 2-0 UR5 27 (SUTURE) ×3 IMPLANT
SUT VICRYL 3 0 UR 6 27 (SUTURE) IMPLANT
SYR BULB IRRIGATION 50ML (SYRINGE) ×3 IMPLANT
SYRINGE 10CC LL (SYRINGE) ×3 IMPLANT
TUBE CONNECTING 12X1/4 (SUCTIONS) ×3 IMPLANT
WATER STERILE IRR 3000ML UROMA (IV SOLUTION) ×3 IMPLANT
WATER STERILE IRR 500ML POUR (IV SOLUTION) ×3 IMPLANT
YANKAUER SUCT BULB TIP NO VENT (SUCTIONS) ×3 IMPLANT

## 2012-11-30 NOTE — Transfer of Care (Signed)
Immediate Anesthesia Transfer of Care Note  Patient: [name]  Procedure(s) Performed: Procedure(s) (LRB): PUBO-VAGINAL SLING (N/A) CYSTOSCOPY/HYDRODISTENSION (N/A)  Patient Location: PACU  Anesthesia Type: General  Level of Consciousness: awake, oriented, sedated and patient cooperative  Airway & Oxygen Therapy: Patient Spontanous Breathing and Patient connected to face mask oxygen  Post-op Assessment: Report given to PACU RN and Post -op Vital signs reviewed and stable  Post vital signs: Reviewed and stable  Complications: No apparent anesthesia complications

## 2012-11-30 NOTE — Anesthesia Postprocedure Evaluation (Signed)
  Anesthesia Post-op Note  Patient: [name]  Procedure(s) Performed: Procedure(s) (LRB): PUBO-VAGINAL SLING (N/A) CYSTOSCOPY/HYDRODISTENSION (N/A)  Patient Location: PACU  Anesthesia Type: General  Level of Consciousness: awake and alert   Airway and Oxygen Therapy: Patient Spontanous Breathing  Post-op Pain: mild  Post-op Assessment: Post-op Vital signs reviewed, Patient's Cardiovascular Status Stable, Respiratory Function Stable, Patent Airway and No signs of Nausea or vomiting  Last Vitals:  Filed Vitals:   11/30/12 1045  BP: 145/89  Pulse:   Temp: 36.8 C  Resp:     Post-op Vital Signs: stable   Complications: No apparent anesthesia complications

## 2012-11-30 NOTE — Anesthesia Preprocedure Evaluation (Addendum)
Anesthesia Evaluation  Patient identified by MRN, date of birth, ID band Patient awake    Reviewed: Allergy & Precautions, H&P , NPO status , Patient's Chart, lab work & pertinent test results  Airway Mallampati: II TM Distance: >3 FB Neck ROM: Full    Dental no notable dental hx.    Pulmonary neg pulmonary ROS, former smoker,  breath sounds clear to auscultation  Pulmonary exam normal       Cardiovascular negative cardio ROS  Rhythm:Regular Rate:Normal     Neuro/Psych negative neurological ROS  negative psych ROS   GI/Hepatic Neg liver ROS, GERD-  Medicated and Poorly Controlled,  Endo/Other  Hypothyroidism   Renal/GU negative Renal ROS  negative genitourinary   Musculoskeletal negative musculoskeletal ROS (+)   Abdominal   Peds negative pediatric ROS (+)  Hematology negative hematology ROS (+)   Anesthesia Other Findings Two upper left teeth missing adjacent to an implant. Full ROM s/p cervical fusion  Reproductive/Obstetrics negative OB ROS                           Anesthesia Physical Anesthesia Plan  ASA: II  Anesthesia Plan: General   Post-op Pain Management:    Induction: Intravenous, Rapid sequence and Cricoid pressure planned  Airway Management Planned: Oral ETT  Additional Equipment:   Intra-op Plan:   Post-operative Plan: Extubation in OR  Informed Consent: I have reviewed the patients History and Physical, chart, labs and discussed the procedure including the risks, benefits and alternatives for the proposed anesthesia with the patient or authorized representative who has indicated his/her understanding and acceptance.   Dental advisory given  Plan Discussed with: CRNA  Anesthesia Plan Comments: (Severe reflux despite medications. Recommend RSI)       Anesthesia Quick Evaluation

## 2012-11-30 NOTE — Op Note (Signed)
Preoperative diagnosis: Extrusion/erosion of suburethral sling, interstitial cystitis Postoperative diagnosis: Same  Procedure: Examination under anesthesia, excision of exposed suburethral mesh, multilayer vaginal closure, cystoscopy with hydraulic overdistention of the bladder   Surgeon: Valetta Fuller M.D.  Anesthesia: Gen.  Indications: [name] is a 47 year old female who is approximately 5 years status post a suburethral sling. That procedure worked well for her and she had no obvious complications or problems until approximately 3 months ago. She then began experiencing some increased discomfort in the suburethral area with some vaginal spotting and a small amount of blood per vagina. She saw her gynecologist who felt that the patient did have evidence of some exposed/eroded mesh. I clinical exam in our office we could feel an area of induration and suggested exam under anesthesia with excision of any exposed mesh and closure of the area. We would also perform cystoscopy to rule out any erosions urethra which we thought was extremely unlikely and at her request for repeat hydraulic overdistention to try to help with what seemed like some increased interstitial cystitis symptoms.     Technique and findings: Patient was brought the operating room where she had successful induction of general anesthesia. She was placed in lithotomy position and prepped and draped in the usual manner. The patient did receive perioperative antibiotics and placement of PAS compression boots. Appropriate surgical timeout was performed. The patient initially underwent cystoscopy. The urethra showed no unusual angles or kinking. There was no evidence of any sling erosion within the urethra and the entire urethra was normal. The bladder was carefully panendoscoped and was without abnormality. The patient underwent hydraulic overdistention to 80 cm water pressure for approximately 3-4 minutes. Capacity was running at a  liter. The patient did have diffuse 1-2+ glomerular hemorrhaging.    The patient had placement of a Foley catheter and a weighted vaginal speculum. In the midportion of the urethra there was an area of exposed sling. The tissue itself showed some slight granulation changes but there was no evidence of gross infection and no evidence of any purulence. An inverted U incision was made in the vaginal mucosa underneath the mid urethra. That flap was carefully raised with sharp dissection as well as electrocautery. The exposed mesh was freed from the surrounding mucosa and on both sides followed up into the retropubic space as far as possible. Again we can appreciate no definitive evidence of obvious infection. Lengths etc. As much of the sling was removed within the midline and extending up both sides as possible. We then copiously irrigated these areas. A 2 layer closure was then performed utilizing the vaginal tissue over this area. We utilized a combination of 302 0 Vicryl suture. Hemostasis was excellent. Some vaginal packing was placed temporarily. The Foley catheter was removed. The patient was brought to recovery in stable condition having had no obvious complications or problems.

## 2012-11-30 NOTE — H&P (Signed)
History of Present Illness  [name] returns today to reestablish as a new patient in our office. She was seen on multiple occasions over the years for long-standing problems with voiding dysfunction and stress incontinence. She was also felt to have some component of painful bladder syndrome/interstitial cystitis. Urodynamic study in 2008 did demonstrate stress urinary incontinence and she underwent a suburethral sling. A Lynx retropubic sling was performed. She was seen in followup several months later and was doing quite well. At that time her stress incontinence had resolved but she was still having some bladder overactivity. The patient subsequently moved to [place] but is now back here in [place] area. Recently she was seen by Dr. Reynaldo Minium secondary to pain vaginal discharge. Clinical exam he felt that she had evidence of a urethral sling extrusion/erosion.   Past Medical History Problems  1. History of  Depression 311 2. History of  Heartburn 787.1 3. History of  Herniated Disc (C2 - C3) 722.0 4. History of  Hypothyroidism 244.9   I did review some of her hospital data. She did present to the emergency room both in December and in January. She was having complaints of nausea, diarrhea with body aches and fever. There had been some question of colon inflammation, but subsequent GI followup was really not convinced. On both occasions, she had a somewhat abnormal urinalysis but both urine cultures were negative. There was no evidence of a pelvic abscess.      Surgical History Problems  1. History of  Appendectomy 2. History of  Knee Surgery 3. History of  Tonsillectomy  Current Meds 1. Ciprofloxacin HCl 250 MG Oral Tablet; TAKE 1 TABLET BID; Therapy: 11Jul2008 to (Last  Rx:11Jul2008) 2. Fluconazole 150 MG Oral Tablet; TAKE 1 TABLET 1 TIME ONLY; Therapy: 11Jul2008 to  (Evaluate:13Jul2008); Last Rx:11Jul2008 3. Paxil TABS; Therapy: (Recorded:18Apr2008) to 4. Synthroid TABS;  Therapy: (Recorded:18Apr2008) to 5. TraZODone HCl TABS; Therapy: (Recorded:18Apr2008) to 6. Wellbutrin TABS; Therapy: (Recorded:18Apr2008) to  Family History Problems  1. Maternal history of  Diabetes Mellitus V18.0 2. Maternal history of  Heart Disease V17.49 3. 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 No acute distress.  ENT:. The ears and nose are normal in appearance.  Neck: The appearance of the neck is normal and no neck mass is present.  Pulmonary: No respiratory distress and normal respiratory rhythm and effort.  Cardiovascular: Heart rate and rhythm are normal . No peripheral edema.  Abdomen: The abdomen is soft and nontender. No masses are palpated. No CVA tenderness. No hernias are palpable. No hepatosplenomegaly noted.  Genitourinary:. See below.  Chaperone Present: brandy.  Skin: Normal skin turgor, no visible rash and no visible skin lesions.  Neuro/Psych:. Mood and affect are appropriate.    Results/Data Urine [Data Includes: Last 1 Day]   25Mar2014  COLOR YELLOW   APPEARANCE CLEAR   SPECIFIC GRAVITY 1.020   pH 6.0   GLUCOSE NEG mg/dL  BILIRUBIN NEG   KETONE NEG  mg/dL  BLOOD TRACE   PROTEIN NEG mg/dL  UROBILINOGEN 0.2 mg/dL  NITRITE NEG   LEUKOCYTE ESTERASE SMALL   SQUAMOUS EPITHELIAL/HPF MODERATE   WBC 0-2 WBC/hpf  RBC 3-6 RBC/hpf  BACTERIA MODERATE   CRYSTALS NONE SEEN   CASTS NONE SEEN    Assessment Assessed  1. Exposure Of Implanted Vaginal Mesh Into Vagina  629.32 2. Erosion Of Implanted Vaginal Mesh To Surrounding Tissue 629.31  Plan Health Maintenance (V70.0)  1. UA With REFLEX  Done: 25Mar2014 01:27PM  Discussion/Summary   Itzy appears to have some local extrusion of her suburethral sling mesh. It is difficult to visualize and she was tender and uncomfortable. Palpably, however, I could feel an area of induration and certainly a palpable abnormality consistent with some exposed mesh right in the midline under her urethra. I explained to Jeraldin that mesh extrusion is not a rare event. Her symptoms of malodorous vaginal discharge along with some spotting are certainly consistent with that. In my experience it would be exceedingly unlikely for this to result in high fever or some of the symptoms she was having back in December or January. CT imaging did not reveal any evidence of a pelvic abscess and hopefully we are dealing with fairly straightforward sling extrusion. It is unlikely that this is going to resolve on her own and she should undergo exam under anesthesia with probable excision of the exposed portion of mesh and vaginal closure with a flap of healthier tissue. She is told that it is generally impossible to remove all the mesh and typically, we just take out the exposed portion and try to followup it up on both sides to reduce the risk of any further failures. The areas will be copiously irrigated and hopefully it will be a nonevent. There are occasions where this will worsen her stress incontinence, but now 5 years out from a sling, most patients do not notice a particularly worsening of their leakage issues status post removal of a portion of the mesh.   Signatures Electronically signed by : Barron Alvine, M.D.; Nov 23 2012 12:44PM

## 2012-11-30 NOTE — Anesthesia Procedure Notes (Signed)
Procedure Name: Intubation Date/Time: 11/30/2012 9:46 AM Performed by: Renella Cunas D Pre-anesthesia Checklist: Patient identified, Emergency Drugs available, Suction available and Patient being monitored Patient Re-evaluated:Patient Re-evaluated prior to inductionOxygen Delivery Method: Circle System Utilized Preoxygenation: Pre-oxygenation with 100% oxygen Intubation Type: IV induction Ventilation: Mask ventilation without difficulty Laryngoscope Size: Mac and 4 Grade View: Grade I Tube type: Oral Tube size: 7.0 mm Number of attempts: 1 Airway Equipment and Method: stylet,  oral airway,  Stylet and LTA kit utilized Placement Confirmation: ETT inserted through vocal cords under direct vision,  positive ETCO2 and breath sounds checked- equal and bilateral Secured at: 21 cm Tube secured with: Tape Dental Injury: Teeth and Oropharynx as per pre-operative assessment

## 2012-12-01 ENCOUNTER — Encounter (HOSPITAL_BASED_OUTPATIENT_CLINIC_OR_DEPARTMENT_OTHER): Payer: Self-pay | Admitting: Urology

## 2012-12-30 ENCOUNTER — Other Ambulatory Visit: Payer: Self-pay

## 2012-12-30 DIAGNOSIS — Z1231 Encounter for screening mammogram for malignant neoplasm of breast: Secondary | ICD-10-CM

## 2013-01-09 ENCOUNTER — Encounter: Payer: Self-pay | Admitting: Anesthesiology

## 2013-01-12 ENCOUNTER — Encounter: Payer: Self-pay | Admitting: Gynecology

## 2013-01-12 ENCOUNTER — Ambulatory Visit (INDEPENDENT_AMBULATORY_CARE_PROVIDER_SITE_OTHER): Payer: Managed Care, Other (non HMO) | Admitting: Gynecology

## 2013-01-12 VITALS — BP 130/74

## 2013-01-12 DIAGNOSIS — R635 Abnormal weight gain: Secondary | ICD-10-CM

## 2013-01-12 DIAGNOSIS — N949 Unspecified condition associated with female genital organs and menstrual cycle: Secondary | ICD-10-CM

## 2013-01-12 DIAGNOSIS — Z79890 Hormone replacement therapy (postmenopausal): Secondary | ICD-10-CM

## 2013-01-12 DIAGNOSIS — R102 Pelvic and perineal pain: Secondary | ICD-10-CM

## 2013-01-12 MED ORDER — PHENTERMINE HCL 37.5 MG PO CAPS: 37.5000 mg | ORAL_CAPSULE | ORAL | Status: DC

## 2013-01-12 MED ORDER — ESTRADIOL 1 MG PO TABS: 1.0000 mg | ORAL_TABLET | Freq: Every day | ORAL | Status: DC

## 2013-01-12 NOTE — Progress Notes (Addendum)
Patient presented to the office today with several complaints. Her main issue has been no night sweats irritability mood swing decreased libido. Patient had a full gynecological examination May 2012 in [place] and has informed me that she was found to have an elevated FSH with a value of 23. Patient was on some form of topical estrogen but stated that she discontinued after 3 months if she noticed no improvement her symptoms. He had tried over-the-counter estrablend with minimal improvement her symptoms. Patient also is been complaining of weight gain. She also stated on and off she's had some low abdominal discomfort which she to me last month when she was here in the office . Her pelvic exam at that time was normal. Her Pap smear was normal also. She states her last mammogram was in 2011 she is in the process of scheduling 1. She does her monthly self was examination.  We had an extensive discussion today of the women's health initiative study. We discussed the risks benefits and pros and cons of hormone replacement therapy. We discussed the different routes of administration of hormone replacement therapy. She will be started on Estrace 1 mg by mouth daily. We also had a lengthy discussion on exercise and healthy lifestyle measures as well as literature information was provided. Patient currently weighs 186 pounds with a BMI of 30.04 and a height of 5 feet 6 inches.  Patient will be assisted with her weight reduction plan for only 3 months with phentermine. We discussed that long term usage of this medication can cause pulmonary hypertension and cardiac valvular disease. She will be started on 37.5 mg daily and will come to the office monthly for 3 months not only for weight measurement but for cardiac and pulmonary auscultation and blood pressure readings. Her recent blood pressure was 130/82. Her blood pressure today the office is 130/74.we will check her FSH to confirm indeed she is menopausal since we  do not have records from [place]. Of note patient's had a prior abdominal hysterectomy.

## 2013-01-12 NOTE — Patient Instructions (Addendum)
Patient information: High cholesterol (The Basics)  What is cholesterol? - Cholesterol is a substance that is found in the blood. Everyone has some. It is needed for good health. The problem is, people sometimes have too much cholesterol. Compared with people with normal cholesterol, people with high cholesterol have a higher risk of heart attacks, strokes, and other health problems. The higher your cholesterol, the higher your risk of these problems. Cholesterol levels in your body are determined significantly by your diet. Cholesterol levels may also be related to heart disease. The following material helps to explain this relationship and discusses what you can do to help keep your heart healthy. Not all cholesterol is bad. Low-density lipoprotein (LDL) cholesterol is the "bad" cholesterol. It may cause fatty deposits to build up inside your arteries. High-density lipoprotein (HDL) cholesterol is "good." It helps to remove the "bad" LDL cholesterol from your blood. Cholesterol is a very important risk factor for heart disease. Other risk factors are high blood pressure, smoking, stress, heredity, and weight.  The heart muscle gets its supply of blood through the coronary arteries. If your LDL cholesterol is high and your HDL cholesterol is low, you are at risk for having fatty deposits build up in your coronary arteries. This leaves less room through which blood can flow. Without sufficient blood and oxygen, the heart muscle cannot function properly and you may feel chest pains (angina pectoris). When a coronary artery closes up entirely, a part of the heart muscle may die, causing a heart attack (myocardial infarction).  CHECKING CHOLESTEROL When your caregiver sends your blood to a lab to be analyzed for cholesterol, a complete lipid (fat) profile may be done. With this test, the total amount of cholesterol and levels of LDL and HDL are determined. Triglycerides  are a type of fat that circulates in the blood and can also be used to determine heart disease risk. Are there different types of cholesterol? - Yes, there are a few different types. If you get a cholesterol test, you may hear your doctor or nurse talk about: Total cholesterol  LDL cholesterol - Some people call this the "bad" cholesterol. That's because having high LDL levels raises your risk of heart attacks, strokes, and other health problems.  HDL cholesterol - Some people call this the "good" cholesterol. That's because having high HDL levels lowers your risk of heart attacks, strokes, and other health problems.  Non-HDL cholesterol - Non-HDL cholesterol is your total cholesterol minus your HDL cholesterol.  Triglycerides - Triglycerides are not cholesterol. They are a type of fat. But they often get measured when cholesterol is measured. (Having high triglycerides also seems to increase the risk of heart attacks and strokes.)   Keep in mind, though, that many people who cannot meet these goals still have a low risk of heart attacks and strokes. What should I do if my doctor tells me I have high cholesterol? - Ask your doctor what your overall risk of heart attacks and strokes is. High cholesterol, by itself, is not always a reason to worry. Having high cholesterol is just one of many things that can increase your risk of heart attacks and strokes. Other factors that increase your risk include:  Cigarette smoking  High blood pressure  Having a parent, sister, or brother who got heart disease at a young age (Young, in this case, means younger than 55 for men and younger than 65 for women.)  Being a man (Women are at risk, too, but men   have a higher risk.)  Older age  If you are at high risk of heart attacks and strokes, having high cholesterol is a problem. On the other hand, if you have are at low risk, having high cholesterol may not mean much. Should I take medicine to lower cholesterol? - Not  everyone who has high cholesterol needs medicines. Your doctor or nurse will decide if you need them based on your age, family history, and other health concerns.  You should probably take a cholesterol-lowering medicine called a statin if you: Already had a heart attack or stroke  Have known heart disease  Have diabetes  Have a condition called peripheral artery disease, which makes it painful to walk, and happens when the arteries in your legs get clogged with fatty deposits  Have an abdominal aortic aneurysm, which is a widening of the main artery in the belly  Most people with any of the conditions listed above should take a statin no matter what their cholesterol level is. If your doctor or nurse puts you on a statin, stay on it. The medicine may not make you feel any different. But it can help prevent heart attacks, strokes, and death.  Can I lower my cholesterol without medicines? - Yes, you can lower your cholesterol some by:  Avoiding red meat, butter, fried foods, cheese, and other foods that have a lot of saturated fat  Losing weight (if you are overweight)  Being more active Even if these steps do little to change your cholesterol, they can improve your health in many ways.                                                   Cholesterol Control Diet  CONTROLLING CHOLESTEROL WITH DIET Although exercise and lifestyle factors are important, your diet is key. That is because certain foods are known to raise cholesterol and others to lower it. The goal is to balance foods for their effect on cholesterol and more importantly, to replace saturated and trans fat with other types of fat, such as monounsaturated fat, polyunsaturated fat, and omega-3 fatty acids. On average, a person should consume no more than 15 to 17 g of saturated fat daily. Saturated and trans fats are considered "bad" fats, and they will raise LDL cholesterol. Saturated fats are primarily found in animal products such as  meats, butter, and cream. However, that does not mean you need to sacrifice all your favorite foods. Today, there are good tasting, low-fat, low-cholesterol substitutes for most of the things you like to eat. Choose low-fat or nonfat alternatives. Choose round or loin cuts of red meat, since these types of cuts are lowest in fat and cholesterol. Chicken (without the skin), fish, veal, and ground turkey breast are excellent choices. Eliminate fatty meats, such as hot dogs and salami. Even shellfish have little or no saturated fat. Have a 3 oz (85 g) portion when you eat lean meat, poultry, or fish. Trans fats are also called "partially hydrogenated oils." They are oils that have been scientifically manipulated so that they are solid at room temperature resulting in a longer shelf life and improved taste and texture of foods in which they are added. Trans fats are found in stick margarine, some tub margarines, cookies, crackers, and baked goods.  When baking and cooking, oils are an excellent substitute   for butter. The monounsaturated oils are especially beneficial since it is believed they lower LDL and raise HDL. The oils you should avoid entirely are saturated tropical oils, such as coconut and palm.  Remember to eat liberally from food groups that are naturally free of saturated and trans fat, including fish, fruit, vegetables, beans, grains (barley, rice, couscous, bulgur wheat), and pasta (without cream sauces).  IDENTIFYING FOODS THAT LOWER CHOLESTEROL  Soluble fiber may lower your cholesterol. This type of fiber is found in fruits such as apples, vegetables such as broccoli, potatoes, and carrots, legumes such as beans, peas, and lentils, and grains such as barley. Foods fortified with plant sterols (phytosterol) may also lower cholesterol. You should eat at least 2 g per day of these foods for a cholesterol lowering effect.  Read package labels to identify low-saturated fats, trans fats free, and  low-fat foods at the supermarket. Select cheeses that have only 2 to 3 g saturated fat per ounce. Use a heart-healthy tub margarine that is free of trans fats or partially hydrogenated oil. When buying baked goods (cookies, crackers), avoid partially hydrogenated oils. Breads and muffins should be made from whole grains (whole-wheat or whole oat flour, instead of "flour" or "enriched flour"). Buy non-creamy canned soups with reduced salt and no added fats.  FOOD PREPARATION TECHNIQUES  Never deep-fry. If you must fry, either stir-fry, which uses very little fat, or use non-stick cooking sprays. When possible, broil, bake, or roast meats, and steam vegetables. Instead of dressing vegetables with butter or margarine, use lemon and herbs, applesauce and cinnamon (for squash and sweet potatoes), nonfat yogurt, salsa, and low-fat dressings for salads.  LOW-SATURATED FAT / LOW-FAT FOOD SUBSTITUTES Meats / Saturated Fat (g)  Avoid: Steak, marbled (3 oz/85 g) / 11 g   Choose: Steak, lean (3 oz/85 g) / 4 g   Avoid: Hamburger (3 oz/85 g) / 7 g   Choose: Hamburger, lean (3 oz/85 g) / 5 g   Avoid: Ham (3 oz/85 g) / 6 g   Choose: Ham, lean cut (3 oz/85 g) / 2.4 g   Avoid: Chicken, with skin, dark meat (3 oz/85 g) / 4 g   Choose: Chicken, skin removed, dark meat (3 oz/85 g) / 2 g   Avoid: Chicken, with skin, light meat (3 oz/85 g) / 2.5 g   Choose: Chicken, skin removed, light meat (3 oz/85 g) / 1 g  Dairy / Saturated Fat (g)  Avoid: Whole milk (1 cup) / 5 g   Choose: Low-fat milk, 2% (1 cup) / 3 g   Choose: Low-fat milk, 1% (1 cup) / 1.5 g   Choose: Skim milk (1 cup) / 0.3 g   Avoid: Hard cheese (1 oz/28 g) / 6 g   Choose: Skim milk cheese (1 oz/28 g) / 2 to 3 g   Avoid: Cottage cheese, 4% fat (1 cup) / 6.5 g   Choose: Low-fat cottage cheese, 1% fat (1 cup) / 1.5 g   Avoid: Ice cream (1 cup) / 9 g   Choose: Sherbet (1 cup) / 2.5 g   Choose: Nonfat frozen yogurt (1 cup) / 0.3 g    Choose: Frozen fruit bar / trace   Avoid: Whipped cream (1 tbs) / 3.5 g   Choose: Nondairy whipped topping (1 tbs) / 1 g  Condiments / Saturated Fat (g)  Avoid: Mayonnaise (1 tbs) / 2 g   Choose: Low-fat mayonnaise (1 tbs) / 1 g   Avoid:   Butter (1 tbs) / 7 g   Choose: Extra light margarine (1 tbs) / 1 g   Avoid: Coconut oil (1 tbs) / 11.8 g   Choose: Olive oil (1 tbs) / 1.8 g   Choose: Corn oil (1 tbs) / 1.7 g   Choose: Safflower oil (1 tbs) / 1.2 g   Choose: Sunflower oil (1 tbs) / 1.4 g   Choose: Soybean oil (1 tbs) / 2.4 g   Choose: Canola oil (1 tbs) / 1 g  Exercise to Lose Weight Exercise and a healthy diet may help you lose weight. Your doctor may suggest specific exercises. EXERCISE IDEAS AND TIPS Choose low-cost things you enjoy doing, such as walking, bicycling, or exercising to workout videos.  Take stairs instead of the elevator.  Walk during your lunch break.  Park your car further away from work or school.  Go to a gym or an exercise class.  Start with 5 to 10 minutes of exercise each day. Build up to 30 minutes of exercise 4 to 6 days a week.  Wear shoes with good support and comfortable clothes.  Stretch before and after working out.  Work out until you breathe harder and your heart beats faster.  Drink extra water when you exercise.  Do not do so much that you hurt yourself, feel dizzy, or get very short of breath.  Exercises that burn about 150 calories: Running 1  miles in 15 minutes.  Playing volleyball for 45 to 60 minutes.  Washing and waxing a car for 45 to 60 minutes.  Playing touch football for 45 minutes.  Walking 1  miles in 35 minutes.  Pushing a stroller 1  miles in 30 minutes.  Playing basketball for 30 minutes.  Raking leaves for 30 minutes.  Bicycling 5 miles in 30 minutes.  Walking 2 miles in 30 minutes.  Dancing for 30 minutes.  Shoveling snow for 15 minutes.  Swimming laps for 20 minutes.  Walking up stairs for 15  minutes.  Bicycling 4 miles in 15 minutes.  Gardening for 30 to 45 minutes.  Jumping rope for 15 minutes.  Washing windows or floors for 45 to 60 minutes.  Document Released: 09/19/2010 Document Revised: 04/29/2011 Document Reviewed: 09/19/2010 [facility] Patient Information 2012 [place], [place].  Phentermine tablets or capsules What is this medicine? PHENTERMINE (FEN ter meen) decreases your appetite. It is used with a reduced calorie diet and exercise to help you lose weight. This medicine may be used for other purposes; ask your health care provider or pharmacist if you have questions. What should I tell my health care provider before I take this medicine? They need to know if you have any of these conditions: -agitation -glaucoma -heart disease -high blood pressure -history of substance abuse -lung disease called Primary Pulmonary Hypertension (PPH) -taken an MAOI like Carbex, Eldepryl, Marplan, Nardil, or Parnate in last 14 days -thyroid disease -an unusual or allergic reaction to phentermine, other medicines, foods, dyes, or preservatives -pregnant or trying to get pregnant -breast-feeding How should I use this medicine? Take this medicine by mouth with a glass of water. Follow the directions on the prescription label. This medicine is usually taken 30 minutes before or 1 to 2 hours after breakfast. Avoid taking this medicine in the evening. It may interfere with sleep. Take your doses at regular intervals. Do not take your medicine more often than directed. Talk to your pediatrician regarding the use of this medicine in children. Special care may be  needed. Overdosage: If you think you have taken too much of this medicine contact a poison control center or emergency room at once. NOTE: This medicine is only for you. Do not share this medicine with others. What if I miss a dose? If you miss a dose, take it as soon as you can. If it is almost time for your next dose, take only that  dose. Do not take double or extra doses. What may interact with this medicine? Do not take this medicine with any of the following medications: -duloxetine -MAOIs like Carbex, Eldepryl, Marplan, Nardil, and Parnate -medicines for colds or breathing difficulties like pseudoephedrine or phenylephrine -procarbazine -sibutramine -SSRIs like citalopram, escitalopram, fluoxetine, fluvoxamine, paroxetine, and sertraline -stimulants like dexmethylphenidate, methylphenidate or modafinil -venlafaxine This medicine may also interact with the following medications: -medicines for diabetes This list may not describe all possible interactions. Give your health care provider a list of all the medicines, herbs, non-prescription drugs, or dietary supplements you use. Also tell them if you smoke, drink alcohol, or use illegal drugs. Some items may interact with your medicine. What should I watch for while using this medicine? Notify your physician immediately if you become short of breath while doing your normal activities. Do not take this medicine within 6 hours of bedtime. It can keep you from getting to sleep. Avoid drinks that contain caffeine and try to stick to a regular bedtime every night. This medicine was intended to be used in addition to a healthy diet and exercise. The best results are achieved this way. This medicine is only indicated for short-term use. Eventually your weight loss may level out. At that point, the drug will only help you maintain your new weight. Do not increase or in any way change your dose without consulting your doctor. You may get drowsy or dizzy. Do not drive, use machinery, or do anything that needs mental alertness until you know how this medicine affects you. Do not stand or sit up quickly, especially if you are an older patient. This reduces the risk of dizzy or fainting spells. Alcohol may increase dizziness and drowsiness. Avoid alcoholic drinks. What side effects may I  notice from receiving this medicine? Side effects that you should report to your doctor or health care professional as soon as possible: -chest pain, palpitations -depression or severe changes in mood -increased blood pressure -irritability -nervousness or restlessness -severe dizziness -shortness of breath -problems urinating -unusual swelling of the legs -vomiting Side effects that usually do not require medical attention (report to your doctor or health care professional if they continue or are bothersome): -blurred vision or other eye problems -changes in sexual ability or desire -constipation or diarrhea -difficulty sleeping -dry mouth or unpleasant taste -headache -nausea This list may not describe all possible side effects. Call your doctor for medical advice about side effects. You may report side effects to FDA at 1-800-FDA-1088. Where should I keep my medicine? Keep out of the reach of children. This medicine can be abused. Keep your medicine in a safe place to protect it from theft. Do not share this medicine with anyone. Selling or giving away this medicine is dangerous and against the law. Store at room temperature between 20 and 25 degrees C (68 and 77 degrees F). Keep container tightly closed. Throw away any unused medicine after the expiration date. NOTE: This sheet is a summary. It may not cover all possible information. If you have questions about this medicine, talk to your doctor, pharmacist,  or health care provider.  2013, [employer]. (10/01/2010 11:02:44 AM)

## 2013-01-12 NOTE — Addendum Note (Signed)
Addended by: Ok Edwards on: 01/12/2013 03:44 PM   Modules accepted: Orders

## 2013-01-13 LAB — FOLLICLE STIMULATING HORMONE: FSH: 22.9 m[IU]/mL

## 2013-02-16 ENCOUNTER — Ambulatory Visit: Payer: Managed Care, Other (non HMO) | Admitting: Gynecology

## 2013-02-24 ENCOUNTER — Ambulatory Visit: Payer: Managed Care, Other (non HMO) | Admitting: Gynecology

## 2013-04-25 ENCOUNTER — Other Ambulatory Visit: Payer: Self-pay | Admitting: Endocrinology

## 2013-04-25 DIAGNOSIS — J45909 Unspecified asthma, uncomplicated: Secondary | ICD-10-CM | POA: Insufficient documentation

## 2013-04-25 MED ORDER — ALBUTEROL SULFATE HFA 108 (90 BASE) MCG/ACT IN AERS: 2.0000 | INHALATION_SPRAY | Freq: Four times a day (QID) | RESPIRATORY_TRACT | Status: DC | PRN

## 2013-06-05 DIAGNOSIS — Z0289 Encounter for other administrative examinations: Secondary | ICD-10-CM

## 2013-07-06 ENCOUNTER — Other Ambulatory Visit: Payer: Self-pay

## 2013-12-26 ENCOUNTER — Encounter (HOSPITAL_BASED_OUTPATIENT_CLINIC_OR_DEPARTMENT_OTHER): Payer: Self-pay | Admitting: Urology

## 2014-04-14 ENCOUNTER — Encounter: Payer: Self-pay | Admitting: *Deleted

## 2014-05-24 ENCOUNTER — Emergency Department (HOSPITAL_COMMUNITY): Payer: Managed Care, Other (non HMO)

## 2014-05-24 ENCOUNTER — Emergency Department (HOSPITAL_COMMUNITY)
Admission: EM | Admit: 2014-05-24 | Discharge: 2014-05-24 | Disposition: A | Discharge status: Home / Self Care | Payer: Managed Care, Other (non HMO) | Attending: Emergency Medicine | Admitting: Emergency Medicine

## 2014-05-24 ENCOUNTER — Encounter (HOSPITAL_COMMUNITY): Payer: Self-pay | Admitting: Emergency Medicine

## 2014-05-24 DIAGNOSIS — Z8619 Personal history of other infectious and parasitic diseases: Secondary | ICD-10-CM | POA: Diagnosis not present

## 2014-05-24 DIAGNOSIS — R110 Nausea: Secondary | ICD-10-CM | POA: Insufficient documentation

## 2014-05-24 DIAGNOSIS — Z90710 Acquired absence of both cervix and uterus: Secondary | ICD-10-CM | POA: Insufficient documentation

## 2014-05-24 DIAGNOSIS — Z8742 Personal history of other diseases of the female genital tract: Secondary | ICD-10-CM | POA: Diagnosis not present

## 2014-05-24 DIAGNOSIS — Z8679 Personal history of other diseases of the circulatory system: Secondary | ICD-10-CM | POA: Insufficient documentation

## 2014-05-24 DIAGNOSIS — Z9089 Acquired absence of other organs: Secondary | ICD-10-CM | POA: Diagnosis not present

## 2014-05-24 DIAGNOSIS — K219 Gastro-esophageal reflux disease without esophagitis: Secondary | ICD-10-CM | POA: Diagnosis not present

## 2014-05-24 DIAGNOSIS — Z79899 Other long term (current) drug therapy: Secondary | ICD-10-CM | POA: Insufficient documentation

## 2014-05-24 DIAGNOSIS — Z87891 Personal history of nicotine dependence: Secondary | ICD-10-CM | POA: Insufficient documentation

## 2014-05-24 DIAGNOSIS — J45909 Unspecified asthma, uncomplicated: Secondary | ICD-10-CM | POA: Diagnosis not present

## 2014-05-24 DIAGNOSIS — E039 Hypothyroidism, unspecified: Secondary | ICD-10-CM | POA: Diagnosis not present

## 2014-05-24 DIAGNOSIS — Z8669 Personal history of other diseases of the nervous system and sense organs: Secondary | ICD-10-CM | POA: Diagnosis not present

## 2014-05-24 DIAGNOSIS — R197 Diarrhea, unspecified: Secondary | ICD-10-CM | POA: Diagnosis not present

## 2014-05-24 DIAGNOSIS — R1032 Left lower quadrant pain: Secondary | ICD-10-CM

## 2014-05-24 LAB — COMPREHENSIVE METABOLIC PANEL
ALK PHOS: 105 U/L (ref 39–117)
ALT: 27 U/L (ref 0–35)
ANION GAP: 14 (ref 5–15)
AST: 23 U/L (ref 0–37)
Albumin: 4.3 g/dL (ref 3.5–5.2)
BUN: 25 mg/dL — AB (ref 6–23)
CO2: 24 mEq/L (ref 19–32)
Calcium: 9.4 mg/dL (ref 8.4–10.5)
Chloride: 101 mEq/L (ref 96–112)
Creatinine, Ser: 0.86 mg/dL (ref 0.50–1.10)
GFR calc Af Amer: >90 mL/min (ref >90)
GFR, EST NON AFRICAN AMERICAN: 79 mL/min — AB (ref >90)
GLUCOSE: 93 mg/dL (ref 70–99)
Potassium: 4.2 mEq/L (ref 3.7–5.3)
Sodium: 139 mEq/L (ref 137–147)
TOTAL PROTEIN: 7.1 g/dL (ref 6.0–8.3)
Total Bilirubin: 0.2 mg/dL — ABNORMAL LOW (ref 0.3–1.2)

## 2014-05-24 LAB — URINALYSIS, ROUTINE W REFLEX MICROSCOPIC
Bilirubin Urine: NEGATIVE
Glucose, UA: NEGATIVE mg/dL
HGB URINE DIPSTICK: NEGATIVE
Ketones, ur: NEGATIVE mg/dL
Leukocytes, UA: NEGATIVE
Nitrite: NEGATIVE
PROTEIN: NEGATIVE mg/dL
Specific Gravity, Urine: 1.012 (ref 1.005–1.030)
Urobilinogen, UA: 0.2 mg/dL (ref 0.0–1.0)
pH: 6 (ref 5.0–8.0)

## 2014-05-24 LAB — CBC WITH DIFFERENTIAL/PLATELET
Basophils Absolute: 0 10*3/uL (ref 0.0–0.1)
Basophils Relative: 0 % (ref 0–1)
EOS ABS: 0.2 10*3/uL (ref 0.0–0.7)
Eosinophils Relative: 2 % (ref 0–5)
HCT: 37.6 % (ref 36.0–46.0)
HEMOGLOBIN: 12.9 g/dL (ref 12.0–15.0)
LYMPHS ABS: 1.9 10*3/uL (ref 0.7–4.0)
LYMPHS PCT: 26 % (ref 12–46)
MCH: 28.5 pg (ref 26.0–34.0)
MCHC: 34.3 g/dL (ref 30.0–36.0)
MCV: 83 fL (ref 78.0–100.0)
MONOS PCT: 7 % (ref 3–12)
Monocytes Absolute: 0.5 10*3/uL (ref 0.1–1.0)
NEUTROS PCT: 65 % (ref 43–77)
Neutro Abs: 4.8 10*3/uL (ref 1.7–7.7)
Platelets: 239 10*3/uL (ref 150–400)
RBC: 4.53 MIL/uL (ref 3.87–5.11)
RDW: 14.1 % (ref 11.5–15.5)
WBC: 7.4 10*3/uL (ref 4.0–10.5)

## 2014-05-24 LAB — LIPASE, BLOOD: Lipase: 12 U/L (ref 11–59)

## 2014-05-24 MED ORDER — HYDROMORPHONE HCL 1 MG/ML IJ SOLN
0.5000 mg | Freq: Once | INTRAMUSCULAR | Status: AC
  Administered 2014-05-24: 0.5 mg via INTRAVENOUS
  Filled 2014-05-24: qty 1

## 2014-05-24 MED ORDER — IOHEXOL 300 MG/ML  SOLN
100.0000 mL | Freq: Once | INTRAMUSCULAR | Status: AC | PRN
  Administered 2014-05-24: 100 mL via INTRAVENOUS

## 2014-05-24 MED ORDER — ONDANSETRON 4 MG PO TBDP: ORAL_TABLET | ORAL | Status: DC

## 2014-05-24 MED ORDER — HYDROMORPHONE HCL 1 MG/ML IJ SOLN
1.0000 mg | Freq: Once | INTRAMUSCULAR | Status: AC
  Administered 2014-05-24: 1 mg via INTRAVENOUS
  Filled 2014-05-24: qty 1

## 2014-05-24 MED ORDER — DICYCLOMINE HCL 10 MG/ML IM SOLN
20.0000 mg | Freq: Once | INTRAMUSCULAR | Status: AC
Start: 2014-05-24 — End: 2014-05-24
  Administered 2014-05-24: 20 mg via INTRAMUSCULAR
  Filled 2014-05-24: qty 2

## 2014-05-24 MED ORDER — IOHEXOL 300 MG/ML  SOLN
50.0000 mL | Freq: Once | INTRAMUSCULAR | Status: AC | PRN
  Administered 2014-05-24: 50 mL via ORAL

## 2014-05-24 NOTE — ED Notes (Signed)
Pt c/o diarrhea x 2 days w/ LLQ pain radiating to back that started today.  Denies vomiting, has nausea.

## 2014-05-24 NOTE — ED Provider Notes (Signed)
CSN: [id number]     Arrival date & time 05/24/14  1650 History   First MD Initiated Contact with Patient 05/24/14 1731     Chief Complaint  Patient presents with  . Abdominal Pain     (Consider location/radiation/quality/duration/timing/severity/associated sxs/prior Treatment) HPI [name] is a 48 y.o. female with a history of IBS and appendectomy is here for evaluation of lower left abdominal pain. Patient states the pain started this afternoon she characterizes as a sharp and stabbing sensation that radiates through to her back. She has had associated diarrhea since 2 days prior. Denies any blood in her stool. She has tried Aleve for the pain with minimal relief. She cannot find anything that makes the pain better or worse. She reports a fever at home yesterday. She did not measure it but she reports it broke. She also reports eating lunch today and experienced some nausea but no vomiting. Denies any recent travel or antibiotic use. She reports having been seen here 2 years ago with the same pain and was told it was IBD. She is followed by Dr. Amedeo Plenty in GI but has not seen him recently. She is not currently taking anything for her IBS but reports having taken Bentyl in the past with relief of symptoms. The chest pain, shortness of breath, dysuria, hematuria, numbness or weakness, vaginal bleeding or discharge. Previous abdominal surgeries include abdominal hysterectomy, lumbar fusion  Past Medical History  Diagnosis Date  . IC (interstitial cystitis)   . RLS (restless legs syndrome)   . GERD (gastroesophageal reflux disease)   . Depression   . Hypothyroidism   . Environmental allergies   . IBS (irritable bowel syndrome)   . Frequency of urination   . Nocturia   . SUI (stress urinary incontinence, female)   . Erosion of suburethral sling   . Herpes simplex type 2 infection   . NSVD (normal spontaneous vaginal delivery)     x3  . Migraine   . Asthma    Past Surgical History   Procedure Laterality Date  . Cesarean section  2000  . Pelvic laparoscopy      LYSIS ADHESIONS  . Tonsillectomy  1986  . Appendectomy  1985  . Anterior cervical decomp/discectomy fusion    . Abdominal hysterectomy  2006  . Knee arthroscopy Right   . Lumbar fusion  12-04-2008    L4  -- S1  . Lumbar disc surgery  02-02-2007    RIGHT SIDE L5 -- S1  . Lynx retropubic suburethral sling  03-02-2007  . Back surgery  2008  . Tubal ligation  2001    hulka clip  . Pubovaginal sling N/A 11/30/2012    Procedure: Gaynelle Arabian;  Surgeon: Bernestine Amass, MD;  Location: [facility];  Service: Urology;  Laterality: N/A;  1 HR EXAM UNDER ANESTHESIA, EXCISION OF SUB URETHRAL MESH, CYSTO, HOD   . Cysto with hydrodistension N/A 11/30/2012    Procedure: CYSTOSCOPY/HYDRODISTENSION;  Surgeon: Bernestine Amass, MD;  Location: [facility];  Service: Urology;  Laterality: N/A;   Family History  Problem Relation Age of Onset  . Diabetes Mother   . Hypertension Mother   . Heart disease Mother   . Cancer Father     LUNG AND BRAIN  . Hypertension Father   . Heart disease Maternal Grandmother   . Cancer Maternal Grandfather     LUNG  . Heart disease Paternal Grandmother   . Cancer Paternal Grandfather  History  Substance Use Topics  . Smoking status: Former Smoker -- 0.50 packs/day for 14 years    Types: Cigarettes    Quit date: 10/26/2012  . Smokeless tobacco: Never Used  . Alcohol Use: No   OB History   Grav Para Term Preterm Abortions TAB SAB Ect Mult Living   4 4        4      Review of Systems  Constitutional: Negative for fever.  HENT: Negative for sore throat.   Eyes: Negative for visual disturbance.  Respiratory: Negative for shortness of breath.   Cardiovascular: Negative for chest pain.  Gastrointestinal: Positive for nausea, abdominal pain and diarrhea.  Endocrine: Negative for polyuria.  Genitourinary: Negative for dysuria.  Skin: Negative for  rash.  Neurological: Negative for headaches.      Allergies  Bactrim; Percocet; and Vicodin  Home Medications   Prior to Admission medications   Medication Sig Start Date End Date Taking? Authorizing Provider  albuterol (PROVENTIL HFA;VENTOLIN HFA) 108 (90 BASE) MCG/ACT inhaler Inhale 2 puffs into the lungs every 6 (six) hours as needed. For shortness of breath 04/25/13  Yes Elayne Snare, MD  diazepam (VALIUM) 2 MG tablet Take 2 mg by mouth at bedtime.   Yes Historical Provider, MD  levothyroxine (SYNTHROID, LEVOTHROID) 137 MCG tablet Take 137 mcg by mouth every morning.    Yes Historical Provider, MD  omeprazole (PRILOSEC) 40 MG capsule Take 40 mg by mouth at bedtime.   Yes Historical Provider, MD  PARoxetine (PAXIL) 20 MG tablet Take 20 mg by mouth at bedtime.   Yes Historical Provider, MD  rOPINIRole (REQUIP) 2 MG tablet Take 2 mg by mouth at bedtime.   Yes Historical Provider, MD  traZODone (DESYREL) 50 MG tablet Take 50 mg by mouth at bedtime.   Yes Historical Provider, MD  valACYclovir (VALTREX) 500 MG tablet Take 500 mg by mouth at bedtime.   Yes Historical Provider, MD  ondansetron (ZOFRAN ODT) 4 MG disintegrating tablet 4mg  ODT q4 hours prn nausea/vomit 05/24/14   Viona Gilmore , PA-C   BP 137/85  Pulse 66  Temp(Src) 98.8 F (37.1 C) (Oral)  Resp 18  SpO2 93%  LMP 06/03/2012 Physical Exam  Nursing note and vitals reviewed. Constitutional: She is oriented to person, place, and time. She appears well-developed and well-nourished.  HENT:  Head: Normocephalic and atraumatic.  Mouth/Throat: Oropharynx is clear and moist.  Eyes: Conjunctivae are normal. Pupils are equal, round, and reactive to light. Right eye exhibits no discharge. Left eye exhibits no discharge. No scleral icterus.  Neck: Neck supple. No JVD present.  Cardiovascular: Normal rate, regular rhythm and normal heart sounds.   Pulmonary/Chest: Effort normal and breath sounds normal. No respiratory distress. She  has no wheezes. She has no rales.  Abdominal: Soft. There is no tenderness.  Tenderness to palpation of the lower left quadrant mild tenderness to palpation of right lower quadrant. Abdomen is soft with bowel sounds present in all 4 quadrants. No rashes, masses, or other obvious lesions appreciated.  Musculoskeletal: She exhibits no tenderness.  No tenderness of lumbar or thoracic spine, no bony step-offs appreciated.  Neurological: She is alert and oriented to person, place, and time.  Cranial Nerves II-XII grossly intact  Skin: Skin is warm and dry. No rash noted.  Psychiatric: She has a normal mood and affect.    ED Course  Procedures (including critical care time) Labs Review Labs Reviewed  COMPREHENSIVE METABOLIC PANEL - Abnormal; Notable for the  following:    BUN 25 (*)    Total Bilirubin 0.2 (*)    GFR calc non Af Amer 79 (*)    All other components within normal limits  CBC WITH DIFFERENTIAL  LIPASE, BLOOD  URINALYSIS, ROUTINE W REFLEX MICROSCOPIC    Imaging Review Ct Abdomen Pelvis W Contrast  05/24/2014   CLINICAL DATA:  Left lower quadrant pain.  Nausea.  EXAM: CT ABDOMEN AND PELVIS WITH CONTRAST  TECHNIQUE: Multidetector CT imaging of the abdomen and pelvis was performed using the standard protocol following bolus administration of intravenous contrast.  CONTRAST:  55mL OMNIPAQUE IOHEXOL 300 MG/ML SOLN, 180mL OMNIPAQUE IOHEXOL 300 MG/ML SOLN  COMPARISON:  CT 09/24/2012  FINDINGS: Visualization of the lower thorax demonstrates dependent atelectasis. Normal heart size.  Liver is normal in size and contour without focal hepatic lesion identified. Grossly stable prominent porta hepatic lymph nodes measuring up to 11 mm. Gallbladder is unremarkable. Portal veins patent.  The spleen, pancreas and bilateral adrenal glands are unremarkable. Kidneys enhance symmetrically with contrast.  9 mm preaortic lymph node (image 29; series 2). Normal caliber abdominal aorta. Urinary bladder is  decompressed. Status post hysterectomy.  No abnormal bowel wall thickening or evidence for bowel obstruction. No free fluid or free intraperitoneal air.  Lumbar spine fusion hardware appears stable. No aggressive or acute appearing osseous lesions.  IMPRESSION: 1. No cause for acute abdominal pain identified. 2. Prominent preaortic lymph node measuring 9 mm.   Electronically Signed   By: Lovey Newcomer M.D.   On: 05/24/2014 21:43     EKG Interpretation None     Meds given in ED:  Medications  HYDROmorphone (DILAUDID) injection 1 mg (1 mg Intravenous Given 05/24/14 1849)  dicyclomine (BENTYL) injection 20 mg (20 mg Intramuscular Given 05/24/14 1852)  iohexol (OMNIPAQUE) 300 MG/ML solution 50 mL (50 mLs Oral Contrast Given 05/24/14 2056)  iohexol (OMNIPAQUE) 300 MG/ML solution 100 mL (100 mLs Intravenous Contrast Given 05/24/14 2056)  HYDROmorphone (DILAUDID) injection 0.5 mg (0.5 mg Intravenous Given 05/24/14 2218)    Discharge Medication List as of 05/24/2014 10:11 PM    START taking these medications   Details  ondansetron (ZOFRAN ODT) 4 MG disintegrating tablet 4mg  ODT q4 hours prn nausea/vomit, Print       Filed Vitals:   05/24/14 1706 05/24/14 2040 05/24/14 2216  BP: 122/80 140/87 137/85  Pulse: 76 62 66  Temp: 98 F (36.7 C) 97.8 F (36.6 C) 98.8 F (37.1 C)  TempSrc: Oral Oral Oral  Resp: 18 17 18   SpO2: 100% 96% 93%    MDM  Vitals stable - WNL -afebrile Pt resting comfortably in ED. Pain managed in ED. "Bentyl did not help, but Dilaudid did." CT abd showed no cause for present acute abd pain. PE and clinical picture not consistent with any other acute, emergent pathology. Labwork essentially normal and noncontributory Symptomology likely due to IBS and her medication nonadherance. Diverticulitis not likely, no urinary symptoms reported.   Will DC with Zofran for nausea  Discussed f/u with GI/ PCP and return precautions, pt very amenable to plan. Pt is stable, in good  condition and appropriate for discharge.  Prior to patient discharge, I discussed and reviewed this case with Dr.Belfie    Final diagnoses:  Abdominal discomfort in left lower quadrant        Verl Dicker, PA-C 05/25/14 [place], PA-C 05/25/14 1106

## 2014-05-24 NOTE — Discharge Instructions (Signed)
Abdominal Pain, Women °Abdominal (stomach, pelvic, or belly) pain can be caused by many things. It is important to tell your doctor: °· The location of the pain. °· Does it come and go or is it present all the time? °· Are there things that start the pain (eating certain foods, exercise)? °· Are there other symptoms associated with the pain (fever, nausea, vomiting, diarrhea)? °All of this is helpful to know when trying to find the cause of the pain. °CAUSES  °· Stomach: virus or bacteria infection, or ulcer. °· Intestine: appendicitis (inflamed appendix), regional ileitis (Crohn's disease), ulcerative colitis (inflamed colon), irritable bowel syndrome, diverticulitis (inflamed diverticulum of the colon), or cancer of the stomach or intestine. °· Gallbladder disease or stones in the gallbladder. °· Kidney disease, kidney stones, or infection. °· Pancreas infection or cancer. °· Fibromyalgia (pain disorder). °· Diseases of the female organs: °¨ Uterus: fibroid (non-cancerous) tumors or infection. °¨ Fallopian tubes: infection or tubal pregnancy. °¨ Ovary: cysts or tumors. °¨ Pelvic adhesions (scar tissue). °¨ Endometriosis (uterus lining tissue growing in the pelvis and on the pelvic organs). °¨ Pelvic congestion syndrome (female organs filling up with blood just before the menstrual period). °¨ Pain with the menstrual period. °¨ Pain with ovulation (producing an egg). °¨ Pain with an IUD (intrauterine device, birth control) in the uterus. °¨ Cancer of the female organs. °· Functional pain (pain not caused by a disease, may improve without treatment). °· Psychological pain. °· Depression. °DIAGNOSIS  °Your doctor will decide the seriousness of your pain by doing an examination. °· Blood tests. °· X-rays. °· Ultrasound. °· CT scan (computed tomography, special type of X-ray). °· MRI (magnetic resonance imaging). °· Cultures, for infection. °· Barium enema (dye inserted in the large intestine, to better view it with  X-rays). °· Colonoscopy (looking in intestine with a lighted tube). °· Laparoscopy (minor surgery, looking in abdomen with a lighted tube). °· Major abdominal exploratory surgery (looking in abdomen with a large incision). °TREATMENT  °The treatment will depend on the cause of the pain.  °· Many cases can be observed and treated at home. °· Over-the-counter medicines recommended by your caregiver. °· Prescription medicine. °· Antibiotics, for infection. °· Birth control pills, for painful periods or for ovulation pain. °· Hormone treatment, for endometriosis. °· Nerve blocking injections. °· Physical therapy. °· Antidepressants. °· Counseling with a psychologist or psychiatrist. °· Minor or major surgery. °HOME CARE INSTRUCTIONS  °· Do not take laxatives, unless directed by your caregiver. °· Take over-the-counter pain medicine only if ordered by your caregiver. Do not take aspirin because it can cause an upset stomach or bleeding. °· Try a clear liquid diet (broth or water) as ordered by your caregiver. Slowly move to a bland diet, as tolerated, if the pain is related to the stomach or intestine. °· Have a thermometer and take your temperature several times a day, and record it. °· Bed rest and sleep, if it helps the pain. °· Avoid sexual intercourse, if it causes pain. °· Avoid stressful situations. °· Keep your follow-up appointments and tests, as your caregiver orders. °· If the pain does not go away with medicine or surgery, you may try: °¨ Acupuncture. °¨ Relaxation exercises (yoga, meditation). °¨ Group therapy. °¨ Counseling. °SEEK MEDICAL CARE IF:  °· You notice certain foods cause stomach pain. °· Your home care treatment is not helping your pain. °· You need stronger pain medicine. °· You want your IUD removed. °· You feel faint or   lightheaded.  You develop nausea and vomiting.  You develop a rash.  You are having side effects or an allergy to your medicine. SEEK IMMEDIATE MEDICAL CARE IF:   Your  pain does not go away or gets worse.  You have a fever.  Your pain is felt only in portions of the abdomen. The right side could possibly be appendicitis. The left lower portion of the abdomen could be colitis or diverticulitis.  You are passing blood in your stools (bright red or black tarry stools, with or without vomiting).  You have blood in your urine.  You develop chills, with or without a fever.  You pass out. MAKE SURE YOU:   Understand these instructions.  Will watch your condition.  Will get help right away if you are not doing well or get worse. Document Released: 06/14/2007 Document Revised: 01/01/2014 Document Reviewed: 07/04/2009 [facility] Patient Information 2015 [place], [place]. This information is not intended to replace advice given to you by your health care provider. Make sure you discuss any questions you have with your health care provider.   Your evaluated today in the ED for pain. Your workup showed no emergent pathology or source or abdominal pain. You may followup with GI or your primary care Dr. for further evaluation and management of your abdominal discomfort. You may take Zofran for nausea.

## 2014-05-28 NOTE — ED Provider Notes (Signed)
Medical screening examination/treatment/procedure(s) were performed by non-physician practitioner and as supervising physician I was immediately available for consultation/collaboration.   EKG Interpretation None        Malvin Johns, MD 05/28/14 [phone]

## 2014-07-02 ENCOUNTER — Encounter (HOSPITAL_COMMUNITY): Payer: Self-pay | Admitting: Emergency Medicine

## 2014-09-18 ENCOUNTER — Emergency Department (HOSPITAL_COMMUNITY): Payer: BLUE CROSS/BLUE SHIELD

## 2014-09-18 ENCOUNTER — Inpatient Hospital Stay (HOSPITAL_COMMUNITY)
Admission: EM | Admit: 2014-09-18 | Discharge: 2014-09-21 | DRG: 041 | Disposition: A | Discharge status: Home / Self Care | Payer: BLUE CROSS/BLUE SHIELD | Attending: Internal Medicine | Admitting: Internal Medicine

## 2014-09-18 ENCOUNTER — Encounter (HOSPITAL_COMMUNITY): Payer: Self-pay | Admitting: Neurology

## 2014-09-18 ENCOUNTER — Observation Stay (HOSPITAL_COMMUNITY): Payer: BLUE CROSS/BLUE SHIELD

## 2014-09-18 DIAGNOSIS — R0789 Other chest pain: Secondary | ICD-10-CM | POA: Diagnosis present

## 2014-09-18 DIAGNOSIS — J45909 Unspecified asthma, uncomplicated: Secondary | ICD-10-CM | POA: Diagnosis present

## 2014-09-18 DIAGNOSIS — F329 Major depressive disorder, single episode, unspecified: Secondary | ICD-10-CM | POA: Diagnosis present

## 2014-09-18 DIAGNOSIS — E039 Hypothyroidism, unspecified: Secondary | ICD-10-CM | POA: Diagnosis present

## 2014-09-18 DIAGNOSIS — R569 Unspecified convulsions: Secondary | ICD-10-CM | POA: Diagnosis present

## 2014-09-18 DIAGNOSIS — G8194 Hemiplegia, unspecified affecting left nondominant side: Secondary | ICD-10-CM | POA: Diagnosis not present

## 2014-09-18 DIAGNOSIS — G8190 Hemiplegia, unspecified affecting unspecified side: Secondary | ICD-10-CM

## 2014-09-18 DIAGNOSIS — K219 Gastro-esophageal reflux disease without esophagitis: Secondary | ICD-10-CM | POA: Diagnosis present

## 2014-09-18 DIAGNOSIS — G8929 Other chronic pain: Secondary | ICD-10-CM | POA: Diagnosis present

## 2014-09-18 DIAGNOSIS — R591 Generalized enlarged lymph nodes: Secondary | ICD-10-CM | POA: Diagnosis present

## 2014-09-18 DIAGNOSIS — R590 Localized enlarged lymph nodes: Secondary | ICD-10-CM | POA: Diagnosis present

## 2014-09-18 DIAGNOSIS — R079 Chest pain, unspecified: Secondary | ICD-10-CM

## 2014-09-18 DIAGNOSIS — Z90710 Acquired absence of both cervix and uterus: Secondary | ICD-10-CM

## 2014-09-18 DIAGNOSIS — Z981 Arthrodesis status: Secondary | ICD-10-CM

## 2014-09-18 DIAGNOSIS — F419 Anxiety disorder, unspecified: Secondary | ICD-10-CM | POA: Diagnosis present

## 2014-09-18 DIAGNOSIS — K589 Irritable bowel syndrome without diarrhea: Secondary | ICD-10-CM | POA: Diagnosis present

## 2014-09-18 DIAGNOSIS — M549 Dorsalgia, unspecified: Secondary | ICD-10-CM | POA: Diagnosis present

## 2014-09-18 DIAGNOSIS — Z885 Allergy status to narcotic agent status: Secondary | ICD-10-CM

## 2014-09-18 DIAGNOSIS — G2581 Restless legs syndrome: Secondary | ICD-10-CM | POA: Diagnosis present

## 2014-09-18 DIAGNOSIS — M899 Disorder of bone, unspecified: Secondary | ICD-10-CM

## 2014-09-18 DIAGNOSIS — N3010 Interstitial cystitis (chronic) without hematuria: Secondary | ICD-10-CM | POA: Diagnosis present

## 2014-09-18 DIAGNOSIS — Z79899 Other long term (current) drug therapy: Secondary | ICD-10-CM

## 2014-09-18 DIAGNOSIS — R109 Unspecified abdominal pain: Secondary | ICD-10-CM

## 2014-09-18 DIAGNOSIS — N179 Acute kidney failure, unspecified: Secondary | ICD-10-CM | POA: Diagnosis present

## 2014-09-18 DIAGNOSIS — E860 Dehydration: Secondary | ICD-10-CM | POA: Diagnosis present

## 2014-09-18 DIAGNOSIS — R011 Cardiac murmur, unspecified: Secondary | ICD-10-CM | POA: Diagnosis present

## 2014-09-18 DIAGNOSIS — R9389 Abnormal findings on diagnostic imaging of other specified body structures: Secondary | ICD-10-CM

## 2014-09-18 DIAGNOSIS — B009 Herpesviral infection, unspecified: Secondary | ICD-10-CM | POA: Diagnosis present

## 2014-09-18 DIAGNOSIS — R209 Unspecified disturbances of skin sensation: Secondary | ICD-10-CM | POA: Diagnosis present

## 2014-09-18 DIAGNOSIS — Z87891 Personal history of nicotine dependence: Secondary | ICD-10-CM

## 2014-09-18 DIAGNOSIS — R531 Weakness: Secondary | ICD-10-CM

## 2014-09-18 DIAGNOSIS — G43909 Migraine, unspecified, not intractable, without status migrainosus: Secondary | ICD-10-CM | POA: Diagnosis present

## 2014-09-18 DIAGNOSIS — Z881 Allergy status to other antibiotic agents status: Secondary | ICD-10-CM

## 2014-09-18 HISTORY — DX: Headache, unspecified: R51.9

## 2014-09-18 HISTORY — DX: Anxiety disorder, unspecified: F41.9

## 2014-09-18 HISTORY — DX: Unspecified chronic bronchitis: J42

## 2014-09-18 HISTORY — DX: Dorsalgia, unspecified: M54.9

## 2014-09-18 HISTORY — DX: Unspecified convulsions: R56.9

## 2014-09-18 HISTORY — DX: Other chronic pain: G89.29

## 2014-09-18 HISTORY — DX: Headache: R51

## 2014-09-18 HISTORY — DX: Cardiac murmur, unspecified: R01.1

## 2014-09-18 HISTORY — DX: Unspecified osteoarthritis, unspecified site: M19.90

## 2014-09-18 LAB — CBC
HEMATOCRIT: 36.5 % (ref 36.0–46.0)
Hemoglobin: 12.7 g/dL (ref 12.0–15.0)
MCH: 29.3 pg (ref 26.0–34.0)
MCHC: 34.8 g/dL (ref 30.0–36.0)
MCV: 84.3 fL (ref 78.0–100.0)
Platelets: 247 10*3/uL (ref 150–400)
RBC: 4.33 MIL/uL (ref 3.87–5.11)
RDW: 14.4 % (ref 11.5–15.5)
WBC: 6.6 10*3/uL (ref 4.0–10.5)

## 2014-09-18 LAB — DIFFERENTIAL
Basophils Absolute: 0 10*3/uL (ref 0.0–0.1)
Basophils Relative: 0 % (ref 0–1)
EOS PCT: 3 % (ref 0–5)
Eosinophils Absolute: 0.2 10*3/uL (ref 0.0–0.7)
Lymphocytes Relative: 28 % (ref 12–46)
Lymphs Abs: 1.9 10*3/uL (ref 0.7–4.0)
Monocytes Absolute: 0.5 10*3/uL (ref 0.1–1.0)
Monocytes Relative: 7 % (ref 3–12)
NEUTROS ABS: 4.1 10*3/uL (ref 1.7–7.7)
Neutrophils Relative %: 62 % (ref 43–77)

## 2014-09-18 LAB — TSH: TSH: 0.378 u[IU]/mL (ref 0.350–4.500)

## 2014-09-18 LAB — RAPID URINE DRUG SCREEN, HOSP PERFORMED
Amphetamines: NOT DETECTED
BENZODIAZEPINES: POSITIVE — AB
Barbiturates: NOT DETECTED
COCAINE: NOT DETECTED
OPIATES: NOT DETECTED
Tetrahydrocannabinol: NOT DETECTED

## 2014-09-18 LAB — URINALYSIS, ROUTINE W REFLEX MICROSCOPIC
BILIRUBIN URINE: NEGATIVE
GLUCOSE, UA: NEGATIVE mg/dL
Hgb urine dipstick: NEGATIVE
Ketones, ur: NEGATIVE mg/dL
Leukocytes, UA: NEGATIVE
NITRITE: NEGATIVE
PROTEIN: NEGATIVE mg/dL
SPECIFIC GRAVITY, URINE: 1.01 (ref 1.005–1.030)
Urobilinogen, UA: 0.2 mg/dL (ref 0.0–1.0)
pH: 5.5 (ref 5.0–8.0)

## 2014-09-18 LAB — COMPREHENSIVE METABOLIC PANEL
ALBUMIN: 4.2 g/dL (ref 3.5–5.2)
ALT: 24 U/L (ref 0–35)
AST: 27 U/L (ref 0–37)
Alkaline Phosphatase: 100 U/L (ref 39–117)
Anion gap: 7 (ref 5–15)
BILIRUBIN TOTAL: 0.6 mg/dL (ref 0.3–1.2)
BUN: 20 mg/dL (ref 6–23)
CHLORIDE: 107 meq/L (ref 96–112)
CO2: 25 mmol/L (ref 19–32)
Calcium: 9.1 mg/dL (ref 8.4–10.5)
Creatinine, Ser: 1.17 mg/dL — ABNORMAL HIGH (ref 0.50–1.10)
GFR calc Af Amer: 63 mL/min — ABNORMAL LOW (ref >90)
GFR calc non Af Amer: 55 mL/min — ABNORMAL LOW (ref >90)
Glucose, Bld: 98 mg/dL (ref 70–99)
POTASSIUM: 4.2 mmol/L (ref 3.5–5.1)
Sodium: 139 mmol/L (ref 135–145)
Total Protein: 6.5 g/dL (ref 6.0–8.3)

## 2014-09-18 LAB — I-STAT CHEM 8, ED
BUN: 24 mg/dL — AB (ref 6–23)
CHLORIDE: 105 meq/L (ref 96–112)
CREATININE: 1.2 mg/dL — AB (ref 0.50–1.10)
Calcium, Ion: 1.08 mmol/L — ABNORMAL LOW (ref 1.12–1.23)
Glucose, Bld: 102 mg/dL — ABNORMAL HIGH (ref 70–99)
HCT: 41 % (ref 36.0–46.0)
Hemoglobin: 13.9 g/dL (ref 12.0–15.0)
POTASSIUM: 4.1 mmol/L (ref 3.5–5.1)
SODIUM: 139 mmol/L (ref 135–145)
TCO2: 22 mmol/L (ref 0–100)

## 2014-09-18 LAB — PROTIME-INR
INR: 0.96 (ref 0.00–1.49)
PROTHROMBIN TIME: 12.9 s (ref 11.6–15.2)

## 2014-09-18 LAB — I-STAT TROPONIN, ED: TROPONIN I, POC: 0 ng/mL (ref 0.00–0.08)

## 2014-09-18 LAB — APTT: aPTT: 29 seconds (ref 24–37)

## 2014-09-18 LAB — TROPONIN I

## 2014-09-18 LAB — ETHANOL: Alcohol, Ethyl (B): <5 mg/dL (ref 0–9)

## 2014-09-18 MED ORDER — DIAZEPAM 2 MG PO TABS
2.0000 mg | ORAL_TABLET | Freq: Every day | ORAL | Status: DC
  Administered 2014-09-18 – 2014-09-20 (×3): 2 mg via ORAL
  Filled 2014-09-18 (×3): qty 1

## 2014-09-18 MED ORDER — DICYCLOMINE HCL 20 MG PO TABS
20.0000 mg | ORAL_TABLET | Freq: Four times a day (QID) | ORAL | Status: DC
  Administered 2014-09-18 – 2014-09-21 (×11): 20 mg via ORAL
  Filled 2014-09-18 (×15): qty 1

## 2014-09-18 MED ORDER — ONDANSETRON 4 MG PO TBDP: 4.0000 mg | ORAL_TABLET | Freq: Three times a day (TID) | ORAL | Status: DC | PRN

## 2014-09-18 MED ORDER — ROPINIROLE HCL 1 MG PO TABS
2.0000 mg | ORAL_TABLET | Freq: Every day | ORAL | Status: DC
  Administered 2014-09-18 – 2014-09-20 (×3): 2 mg via ORAL
  Filled 2014-09-18 (×4): qty 2

## 2014-09-18 MED ORDER — PANTOPRAZOLE SODIUM 40 MG PO TBEC
40.0000 mg | DELAYED_RELEASE_TABLET | Freq: Every day | ORAL | Status: DC
Start: 2014-09-19 — End: 2014-09-21
  Administered 2014-09-19 – 2014-09-20 (×2): 40 mg via ORAL
  Filled 2014-09-18 (×2): qty 1

## 2014-09-18 MED ORDER — TRAZODONE HCL 50 MG PO TABS
50.0000 mg | ORAL_TABLET | Freq: Every day | ORAL | Status: DC
  Administered 2014-09-18 – 2014-09-20 (×3): 50 mg via ORAL
  Filled 2014-09-18 (×4): qty 1

## 2014-09-18 MED ORDER — PAROXETINE HCL 20 MG PO TABS
20.0000 mg | ORAL_TABLET | Freq: Every day | ORAL | Status: DC
  Administered 2014-09-18 – 2014-09-20 (×3): 20 mg via ORAL
  Filled 2014-09-18 (×4): qty 1

## 2014-09-18 MED ORDER — SODIUM CHLORIDE 0.9 % IJ SOLN
3.0000 mL | Freq: Two times a day (BID) | INTRAMUSCULAR | Status: DC
  Administered 2014-09-19: 3 mL via INTRAVENOUS

## 2014-09-18 MED ORDER — ENOXAPARIN SODIUM 40 MG/0.4ML ~~LOC~~ SOLN
40.0000 mg | SUBCUTANEOUS | Status: DC
  Administered 2014-09-18 – 2014-09-19 (×2): 40 mg via SUBCUTANEOUS
  Filled 2014-09-18 (×3): qty 0.4

## 2014-09-18 MED ORDER — HYDROMORPHONE HCL 1 MG/ML IJ SOLN
0.5000 mg | INTRAMUSCULAR | Status: DC | PRN
  Administered 2014-09-18 – 2014-09-19 (×4): 1 mg via INTRAVENOUS
  Administered 2014-09-19: 0.5 mg via INTRAVENOUS
  Administered 2014-09-19 – 2014-09-20 (×5): 1 mg via INTRAVENOUS
  Filled 2014-09-18 (×11): qty 1

## 2014-09-18 MED ORDER — VALACYCLOVIR HCL 500 MG PO TABS
500.0000 mg | ORAL_TABLET | Freq: Every day | ORAL | Status: DC
  Administered 2014-09-18 – 2014-09-20 (×3): 500 mg via ORAL
  Filled 2014-09-18 (×5): qty 1

## 2014-09-18 MED ORDER — LEVOTHYROXINE SODIUM 137 MCG PO TABS
137.0000 ug | ORAL_TABLET | Freq: Every morning | ORAL | Status: DC
  Administered 2014-09-19 – 2014-09-20 (×2): 137 ug via ORAL
  Filled 2014-09-18 (×4): qty 1

## 2014-09-18 MED ORDER — SODIUM CHLORIDE 0.9 % IV SOLN
INTRAVENOUS | Status: DC
  Administered 2014-09-18 – 2014-09-20 (×4): via INTRAVENOUS

## 2014-09-18 MED ORDER — ALBUTEROL SULFATE (2.5 MG/3ML) 0.083% IN NEBU: 2.5000 mg | INHALATION_SOLUTION | Freq: Four times a day (QID) | RESPIRATORY_TRACT | Status: DC | PRN

## 2014-09-18 MED ORDER — HYDROMORPHONE HCL 1 MG/ML IJ SOLN
1.0000 mg | Freq: Once | INTRAMUSCULAR | Status: AC
  Administered 2014-09-18: 1 mg via INTRAVENOUS
  Filled 2014-09-18: qty 1

## 2014-09-18 NOTE — ED Notes (Signed)
Pt reports today at 86 noticed numbness to left side of her body and face, also pain to left shoulder.  Weak left grip noted, also slurred speech noted. Pt is a x 4.

## 2014-09-18 NOTE — Consult Note (Addendum)
Referring Physician: Zenia Resides    Chief Complaint: Left sided weakness  HPI: [name] is an 49 y.o. female who reports that she was at a meeting today and had the acute onset of pain in her left shoulder.  The patient then began to noticed numbness on the left side of her face and numbness and weakness developed along the left upper and lower extremity.  Patient presented to the ED and code stroke was called in triage.   Patient has had multiple back and neck surgeries in the past.  Reports that after her last surgery was diagnosed with a conversion disorder.    Date last known well: Date: 09/18/2014 Time last known well: Time: 12:00 tPA Given: No: Not felt to be a stroke  Past Medical History  Diagnosis Date  . IC (interstitial cystitis)   . RLS (restless legs syndrome)   . GERD (gastroesophageal reflux disease)   . Depression   . Hypothyroidism   . Environmental allergies   . IBS (irritable bowel syndrome)   . Frequency of urination   . Nocturia   . SUI (stress urinary incontinence, female)   . Erosion of suburethral sling   . Herpes simplex type 2 infection   . NSVD (normal spontaneous vaginal delivery)     x3  . Migraine   . Asthma     Past Surgical History  Procedure Laterality Date  . Cesarean section  2000  . Pelvic laparoscopy      LYSIS ADHESIONS  . Tonsillectomy  1986  . Appendectomy  1985  . Anterior cervical decomp/discectomy fusion    . Abdominal hysterectomy  2006  . Knee arthroscopy Right   . Lumbar fusion  12-04-2008    L4  -- S1  . Lumbar disc surgery  02-02-2007    RIGHT SIDE L5 -- S1  . Lynx retropubic suburethral sling  03-02-2007  . Back surgery  2008  . Tubal ligation  2001    hulka clip  . Pubovaginal sling N/A 11/30/2012    Procedure: Gaynelle Arabian;  Surgeon: Bernestine Amass, MD;  Location: [facility];  Service: Urology;  Laterality: N/A;  1 HR EXAM UNDER ANESTHESIA, EXCISION OF SUB URETHRAL MESH, CYSTO, HOD   . Cysto  with hydrodistension N/A 11/30/2012    Procedure: CYSTOSCOPY/HYDRODISTENSION;  Surgeon: Bernestine Amass, MD;  Location: [facility];  Service: Urology;  Laterality: N/A;    Family History  Problem Relation Age of Onset  . Diabetes Mother   . Hypertension Mother   . Heart disease Mother   . Cancer Father     LUNG AND BRAIN  . Hypertension Father   . Heart disease Maternal Grandmother   . Cancer Maternal Grandfather     LUNG  . Heart disease Paternal Grandmother   . Cancer Paternal Grandfather    Social History:  reports that she quit smoking about 22 months ago. Her smoking use included Cigarettes. She has a 7 pack-year smoking history. She has never used smokeless tobacco. She reports that she does not drink alcohol or use illicit drugs.  Allergies:  Allergies  Allergen Reactions  . Bactrim [Sulfamethoxazole-Trimethoprim] Other (See Comments)    Fever   . Percocet [Oxycodone-Acetaminophen] Itching  . Vicodin [Hydrocodone-Acetaminophen] Itching    Medications: I have reviewed the patient's current medications. Prior to Admission:  Current outpatient prescriptions:  .  albuterol (PROVENTIL HFA;VENTOLIN HFA) 108 (90 BASE) MCG/ACT inhaler, Inhale 2 puffs into the lungs every 6 (  six) hours as needed. For shortness of breath, Disp: 3.7 g, Rfl: 1 .  diazepam (VALIUM) 2 MG tablet, Take 2 mg by mouth at bedtime., Disp: , Rfl:  .  levothyroxine (SYNTHROID, LEVOTHROID) 137 MCG tablet, Take 137 mcg by mouth every morning. , Disp: , Rfl:  .  meloxicam (MOBIC) 7.5 MG tablet, Take 7.5 mg by mouth 2 (two) times daily as needed., Disp: , Rfl: 0 .  omeprazole (PRILOSEC) 40 MG capsule, Take 40 mg by mouth at bedtime., Disp: , Rfl:  .  ondansetron (ZOFRAN ODT) 4 MG disintegrating tablet, 4mg  ODT q4 hours prn nausea/vomit, Disp: 15 tablet, Rfl: 0 .  PARoxetine (PAXIL) 20 MG tablet, Take 20 mg by mouth at bedtime., Disp: , Rfl:  .  rOPINIRole (REQUIP) 2 MG tablet, Take 2 mg by mouth at  bedtime., Disp: , Rfl:  .  traZODone (DESYREL) 50 MG tablet, Take 50 mg by mouth at bedtime., Disp: , Rfl:  .  valACYclovir (VALTREX) 500 MG tablet, Take 500 mg by mouth at bedtime., Disp: , Rfl:   ROS: History obtained from the patient  General ROS: negative for - chills, fatigue, fever, night sweats, weight gain or weight loss Psychological ROS: negative for - behavioral disorder, hallucinations, memory difficulties, mood swings or suicidal ideation Ophthalmic ROS: negative for - blurry vision, double vision, eye pain or loss of vision ENT ROS: negative for - epistaxis, nasal discharge, oral lesions, sore throat, tinnitus or vertigo Allergy and Immunology ROS: negative for - hives or itchy/watery eyes Hematological and Lymphatic ROS: negative for - bleeding problems, bruising or swollen lymph nodes Endocrine ROS: negative for - galactorrhea, hair pattern changes, polydipsia/polyuria or temperature intolerance Respiratory ROS: negative for - cough, hemoptysis, shortness of breath or wheezing Cardiovascular ROS: negative for - chest pain, dyspnea on exertion, edema or irregular heartbeat Gastrointestinal ROS: negative for - abdominal pain, diarrhea, hematemesis, nausea/vomiting or stool incontinence Genito-Urinary ROS: negative for - dysuria, hematuria, incontinence or urinary frequency/urgency Musculoskeletal ROS: left shoulder pain Neurological ROS: as noted in HPI Dermatological ROS: negative for rash and skin lesion changes  Physical Examination: Last menstrual period 06/03/2012.  HEENT-  Normocephalic, no lesions, without obvious abnormality.  Normal external eye and conjunctiva.  Normal TM's bilaterally.  Normal auditory canals and external ears. Normal external nose, mucus membranes and septum.  Normal pharynx. Cardiovascular- S1, S2 normal, pulses palpable throughout   Lungs- chest clear, no wheezing, rales, normal symmetric air entry Abdomen- soft, non-tender; bowel sounds  normal; no masses,  no organomegaly Extremities- no edema Lymph-no adenopathy palpable Musculoskeletal-no joint tenderness, deformity or swelling Skin-warm and dry, no hyperpigmentation, vitiligo, or suspicious lesions  Neurological Examination Mental Status: Alert, oriented, thought content appropriate.  Speech fluent without evidence of aphasia.  Able to follow 3 step commands without difficulty.  Left neglect. Cranial Nerves: II: Discs flat bilaterally; Patient reports a left field cut although prior to testing had no difficulty seeing to the left in the room, pupils equal, round, reactive to light and accommodation III,IV, VI: ptosis not present, extra-ocular motions intact bilaterally V,VII: left facial droop, facial light touch sensation decreased on the left VIII: hearing normal bilaterally IX,X: gag reflex present XI: shoulder shrug decreased on the left XII: midline tongue extension Motor: Right : Upper extremity   5/5    Left:     Upper extremity   4/5  Lower extremity   5/5     Lower extremity   4/5 Initially patient reported that she was  unable to move the left upper or lower extremity. With holding the arm actively patient was able to divert the arm from her face.  When putting on the hospital gown she was able to hold the arm outstretched.  Patient eventually was able to hold the left leg a few inches off the bed.  There was no reciprocal downward movement of the right leg as movement of the left leg was attempted.   Sensory: Pinprick and light touch intact throughout, bilaterally Deep Tendon Reflexes: 2+ and symmetric throughout Plantars: Right: downgoing   Left: downgoing Cerebellar: normal finger-to-nose and normal heel-to-shin testing bilaterally Gait: Unable to test safely   Laboratory Studies:  Basic Metabolic Panel:  Recent Labs Lab 09/18/14 1339 09/18/14 1353  NA 139 139  K 4.2 4.1  CL 107 105  CO2 25  --   GLUCOSE 98 102*  BUN 20 24*  CREATININE 1.17*  1.20*  CALCIUM 9.1  --     Liver Function Tests:  Recent Labs Lab 09/18/14 1339  AST 27  ALT 24  ALKPHOS 100  BILITOT 0.6  PROT 6.5  ALBUMIN 4.2   No results for input(s): LIPASE, AMYLASE in the last 168 hours. No results for input(s): AMMONIA in the last 168 hours.  CBC:  Recent Labs Lab 09/18/14 1339 09/18/14 1353  WBC 6.6  --   NEUTROABS 4.1  --   HGB 12.7 13.9  HCT 36.5 41.0  MCV 84.3  --   PLT 247  --     Cardiac Enzymes: No results for input(s): CKTOTAL, CKMB, CKMBINDEX, TROPONINI in the last 168 hours.  BNP: Invalid input(s): POCBNP  CBG: No results for input(s): GLUCAP in the last 168 hours.  Microbiology: Results for orders placed or performed in visit on 11/16/12  WET PREP FOR [place], YEAST, CLUE     Status: Abnormal   Collection Time: 11/16/12  4:51 PM  Result Value Ref Range Status   Yeast Wet Prep HPF POC MANY (A) NONE SEEN Final   Trich, Wet Prep NONE SEEN NONE SEEN Final   Clue Cells Wet Prep HPF POC NONE SEEN NONE SEEN Final   WBC, Wet Prep HPF POC NONE SEEN NONE SEEN Final    Comment: MANY BACTERIA SEEN (3-6) EPITH. CELLS PER FIELD AMINE NEGATIVE    Coagulation Studies:  Recent Labs  09/18/14 1339  LABPROT 12.9  INR 0.96    Urinalysis: No results for input(s): COLORURINE, LABSPEC, PHURINE, GLUCOSEU, HGBUR, BILIRUBINUR, KETONESUR, PROTEINUR, UROBILINOGEN, NITRITE, LEUKOCYTESUR in the last 168 hours.  Invalid input(s): APPERANCEUR  Lipid Panel: No results found for: CHOL, TRIG, HDL, CHOLHDL, VLDL, LDLCALC  HgbA1C: No results found for: HGBA1C  Urine Drug Screen:      Component Value Date/Time   LABOPIA NONE DETECTED 12/25/2008 1202   COCAINSCRNUR NONE DETECTED 12/25/2008 1202   LABBENZ POSITIVE* 12/25/2008 1202   AMPHETMU NONE DETECTED 12/25/2008 1202   THCU NONE DETECTED 12/25/2008 1202   LABBARB  12/25/2008 1202    NONE DETECTED        DRUG SCREEN FOR MEDICAL PURPOSES ONLY.  IF CONFIRMATION IS NEEDED FOR ANY  PURPOSE, NOTIFY LAB WITHIN 5 DAYS.        LOWEST DETECTABLE LIMITS FOR URINE DRUG SCREEN Drug Class       Cutoff (ng/mL) Amphetamine      1000 Barbiturate      200 Benzodiazepine   737 Tricyclics       106 Opiates  300 Cocaine          300 THC              50    Alcohol Level:   Recent Labs Lab 09/18/14 [place] <5    Other results: EKG: normal sinus rhythm at 79 bpm.  Imaging: Ct Head Wo Contrast  09/18/2014   CLINICAL DATA:  Code stroke, sudden onset LEFT shoulder weakness and LEFT chest pain, LEFT side weakness, history GERD, asthma, former smoker  EXAM: CT HEAD WITHOUT CONTRAST  TECHNIQUE: Contiguous axial images were obtained from the base of the skull through the vertex without intravenous contrast.  COMPARISON:  06/10/2012  FINDINGS: Normal ventricular morphology.  No midline shift or mass effect.  Normal appearance of brain parenchyma.  No intracranial hemorrhage, mass lesion or evidence acute infarction.  No extra-axial fluid collections.  Visualized paranasal sinuses and mastoid air cells clear.  New lytic lesion in LEFT frontal bone since 2013, 12 x 6 mm diameter.  No other definite focal osseous abnormalities.  IMPRESSION: No acute intracranial abnormalities.  New 12 x 6 mm lytic lesion in LEFT frontal bone since 2013, nonspecific; this could represent a metastatic focus or myeloma ; fibrous dysplasia could cause the appearance that is identified though lesion is new since 2013.  Does patient have a history of malignancy or myeloma?  Critical Value/emergent results were called by telephone at the time of interpretation on 09/18/2014 at 1406 hr to Dr. Doy Mince from the Stroke Service who verbally acknowledged these results.   Electronically Signed   By: Lavonia Dana M.D.   On: 09/18/2014 14:06    Assessment: 49 y.o. female presenting with left sided hemiparesis, left neglect and left visual field cut.  Neurological examination exhibits multiple functional features.   Head CT personally reviewed and shows no acute changes.  MRI of the brain reviewed and shows no evidence of an acute infarct.  Do not suspect that this presentation represents a MR negative ischemic event since symptomatology would suggest a large vessel event that I would not expect an MRI to be unable to capture.  Patient without risk factors.    Stroke Risk Factors - none  Plan: 1. No further neurologic intervention is recommended at this time.  If further questions arise, please call or page at that time.  Thank you for allowing neurology to participate in the care of this patient. 2.  Analgesia for shoulder pain 3.  F/U bone lesion by PCP as an outpatient  Case discussed with Dr. Everlene Farrier, MD [facility] [phone] 09/18/2014, 2:59 PM

## 2014-09-18 NOTE — ED Notes (Signed)
Called to give report. Nurse with another patient. RN to call back for report.

## 2014-09-18 NOTE — Progress Notes (Signed)
Report received from [facility] for patient to be admitted into 5w12

## 2014-09-18 NOTE — Code Documentation (Signed)
Per patient she was in a meeting earlier today.  At noon during the meeting she began to have pain in her left shoulder and her left side felt weak and numb.  Code stroke called at 1340.  Head CT done,  nihss 8,  Left arm and leg weakness and numbness,  Left visual field cut, neglect, facial droop. Patient's arm appeared flaccid when asked to hold her arm up.  Then when assessing ataxia, she was able to lift her hand.  Patient transported to MRI.  MRI (-) per radiology report.  Dr Doy Mince at bedside to assess patient. Code stroke canceled

## 2014-09-18 NOTE — ED Provider Notes (Signed)
CSN: [id number]     Arrival date & time 09/18/14  1329 History   First MD Initiated Contact with Patient 09/18/14 1338     Chief Complaint  Patient presents with  . Numbness     (Consider location/radiation/quality/duration/timing/severity/associated sxs/prior Treatment) HPI Comments: Patient here with acute onset of left upper extremity weakness with left-sided facial droop. Patient also complains of left shoulder. Symptoms began approximately at noon today while she was in a meeting. Denies any headache or blurred vision. No vomiting noted. No prior history of same. No medications taken prior to arrival. Nothing makes her symptoms better worse  The history is provided by the patient.    Past Medical History  Diagnosis Date  . IC (interstitial cystitis)   . RLS (restless legs syndrome)   . GERD (gastroesophageal reflux disease)   . Depression   . Hypothyroidism   . Environmental allergies   . IBS (irritable bowel syndrome)   . Frequency of urination   . Nocturia   . SUI (stress urinary incontinence, female)   . Erosion of suburethral sling   . Herpes simplex type 2 infection   . NSVD (normal spontaneous vaginal delivery)     x3  . Migraine   . Asthma    Past Surgical History  Procedure Laterality Date  . Cesarean section  2000  . Pelvic laparoscopy      LYSIS ADHESIONS  . Tonsillectomy  1986  . Appendectomy  1985  . Anterior cervical decomp/discectomy fusion    . Abdominal hysterectomy  2006  . Knee arthroscopy Right   . Lumbar fusion  12-04-2008    L4  -- S1  . Lumbar disc surgery  02-02-2007    RIGHT SIDE L5 -- S1  . Lynx retropubic suburethral sling  03-02-2007  . Back surgery  2008  . Tubal ligation  2001    hulka clip  . Pubovaginal sling N/A 11/30/2012    Procedure: Gaynelle Arabian;  Surgeon: Bernestine Amass, MD;  Location: [facility];  Service: Urology;  Laterality: N/A;  1 HR EXAM UNDER ANESTHESIA, EXCISION OF SUB URETHRAL MESH, CYSTO,  HOD   . Cysto with hydrodistension N/A 11/30/2012    Procedure: CYSTOSCOPY/HYDRODISTENSION;  Surgeon: Bernestine Amass, MD;  Location: [facility];  Service: Urology;  Laterality: N/A;   Family History  Problem Relation Age of Onset  . Diabetes Mother   . Hypertension Mother   . Heart disease Mother   . Cancer Father     LUNG AND BRAIN  . Hypertension Father   . Heart disease Maternal Grandmother   . Cancer Maternal Grandfather     LUNG  . Heart disease Paternal Grandmother   . Cancer Paternal Grandfather    History  Substance Use Topics  . Smoking status: Former Smoker -- 0.50 packs/day for 14 years    Types: Cigarettes    Quit date: 10/26/2012  . Smokeless tobacco: Never Used  . Alcohol Use: No   OB History    Gravida Para Term Preterm AB TAB SAB Ectopic Multiple Living   4 4        4      Review of Systems  All other systems reviewed and are negative.     Allergies  Bactrim; Percocet; and Vicodin  Home Medications   Prior to Admission medications   Medication Sig Start Date End Date Taking? Authorizing Provider  albuterol (PROVENTIL HFA;VENTOLIN HFA) 108 (90 BASE) MCG/ACT inhaler Inhale 2 puffs into  the lungs every 6 (six) hours as needed. For shortness of breath 04/25/13   Elayne Snare, MD  diazepam (VALIUM) 2 MG tablet Take 2 mg by mouth at bedtime.    Historical Provider, MD  levothyroxine (SYNTHROID, LEVOTHROID) 137 MCG tablet Take 137 mcg by mouth every morning.     Historical Provider, MD  omeprazole (PRILOSEC) 40 MG capsule Take 40 mg by mouth at bedtime.    Historical Provider, MD  ondansetron (ZOFRAN ODT) 4 MG disintegrating tablet 4mg  ODT q4 hours prn nausea/vomit 05/24/14   Viona Gilmore Cartner, PA-C  PARoxetine (PAXIL) 20 MG tablet Take 20 mg by mouth at bedtime.    Historical Provider, MD  rOPINIRole (REQUIP) 2 MG tablet Take 2 mg by mouth at bedtime.    Historical Provider, MD  traZODone (DESYREL) 50 MG tablet Take 50 mg by mouth at bedtime.     Historical Provider, MD  valACYclovir (VALTREX) 500 MG tablet Take 500 mg by mouth at bedtime.    Historical Provider, MD   LMP 06/03/2012 Physical Exam  Constitutional: She is oriented to person, place, and time. She appears well-developed and well-nourished.  Non-toxic appearance. No distress.  HENT:  Head: Normocephalic and atraumatic.  Eyes: Conjunctivae, EOM and lids are normal. Pupils are equal, round, and reactive to light.  Neck: Normal range of motion. Neck supple. No tracheal deviation present. No thyroid mass present.  Cardiovascular: Normal rate, regular rhythm and normal heart sounds.  Exam reveals no gallop.   No murmur heard. Pulmonary/Chest: Effort normal and breath sounds normal. No stridor. No respiratory distress. She has no decreased breath sounds. She has no wheezes. She has no rhonchi. She has no rales.  Abdominal: Soft. Normal appearance and bowel sounds are normal. She exhibits no distension. There is no tenderness. There is no rebound and no CVA tenderness.  Musculoskeletal: Normal range of motion. She exhibits no edema or tenderness.  Neurological: She is alert and oriented to person, place, and time. She has normal strength. A cranial nerve deficit is present. No sensory deficit. She exhibits abnormal muscle tone. GCS eye subscore is 4. GCS verbal subscore is 5. GCS motor subscore is 6.  Left-sided facial tube noted. Left upper extremity 4/5 in right upper extremity normal. Speech is not slurred.  Skin: Skin is warm and dry. No abrasion and no rash noted.  Psychiatric: Her speech is normal and behavior is normal. Her affect is blunt.  Nursing note and vitals reviewed.   ED Course  Procedures (including critical care time) Labs Review Labs Reviewed  ETHANOL  PROTIME-INR  APTT  CBC  DIFFERENTIAL  COMPREHENSIVE METABOLIC PANEL  URINE RAPID DRUG SCREEN (HOSP PERFORMED)  URINALYSIS, ROUTINE W REFLEX MICROSCOPIC  I-STAT CHEM 8, ED  I-STAT TROPOININ, ED  I-STAT  TROPOININ, ED    Imaging Review No results found.   EKG Interpretation   Date/Time:  Tuesday September 18 2014 14:55:45 EST Ventricular Rate:  79 PR Interval:  155 QRS Duration: 96 QT Interval:  392 QTC Calculation: 449 R Axis:   53 Text Interpretation:  Sinus rhythm Probable left atrial enlargement RSR'  in V1 or V2, probably normal variant No significant change since last  tracing Confirmed by   MD,  ([id number]) on 09/18/2014 3:19:56 PM      MDM   Final diagnoses:  None   Patient has been seen by neurology and has had negative MRI of her brain. Neurology feels that this could be a possible conversion disorder as patient  does have a prior history of conversion disorder never quite in this presentation. Patient states that she is unable to walk at this time. Will consult the medicine service     Leota Jacobsen, MD 09/18/14 [phone]

## 2014-09-18 NOTE — H&P (Addendum)
History and Physical    [name] JJO:[id number] DOB: [birth year] DOA: 09/18/2014  Referring physician: Dr. Zenia Resides PCP: Hulen Shouts, MD  Specialists: Neurology, Dr. Doy Mince  Chief Complaint: left shoulder/neck pain, left sided weakness  HPI: [name] is a 49 y.o. female has a past medical history significant for Hypothyroidism, IBS, Depression, RLS, prior conversion episodes, migraine HA presents with sudden onset left shoulder pain, left neck pain followed by left sided weakness / numbness. 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 She was brought emergently in the ED and a code stroke was called. Neurology was consulted and evaluated patient. CT scan of the head was negative for intracranial abnormalities, this was followed by an MRI which was negative for CVA. She denies any recent chest pain or breathing difficulties, endorses mild chronic abdominal pain which hasn't changed in the last few days, she had mild nausea today but denies any vomiting. She denies any lightheadedness or dizziness, however endorses a headache. She has had a recent mild URI which is improving. After neurology evaluation, they did not recommend any further testing, however patient was still complaining of left-sided weakness and unable to move her left leg, unable to ambulate, thus TRH was asked for admission. In the emergency room, her blood work is remarkable for mild AK AI with a creatinine of 1.2 and a BUN of 24, her CBC is normal. Both the CT and the MRI show 10-12 mm lytic bone lesion. She has a 20 year smoking history, quit mid 2015. She endorses some weight loss of ~35 lbs since last year, intentional. Also endorses intermittent chest pain, non exertional, no dyspnea.  Review of Systems: as per HPI otherwise negative  Past Medical History  Diagnosis Date  . IC (interstitial cystitis)   . RLS (restless legs  syndrome)   . GERD (gastroesophageal reflux disease)   . Depression   . Hypothyroidism   . Environmental allergies   . IBS (irritable bowel syndrome)   . Frequency of urination   . Nocturia   . SUI (stress urinary incontinence, female)   . Erosion of suburethral sling   . Herpes simplex type 2 infection   . NSVD (normal spontaneous vaginal delivery)     x3  . Migraine   . Asthma    Past Surgical History  Procedure Laterality Date  . Cesarean section  2000  . Pelvic laparoscopy      LYSIS ADHESIONS  . Tonsillectomy  1986  . Appendectomy  1985  . Anterior cervical decomp/discectomy fusion    . Abdominal hysterectomy  2006  . Knee arthroscopy Right   . Lumbar fusion  12-04-2008    L4  -- S1  . Lumbar disc surgery  02-02-2007    RIGHT SIDE L5 -- S1  . Lynx retropubic suburethral sling  03-02-2007  . Back surgery  2008  . Tubal ligation  2001    hulka clip  . Pubovaginal sling N/A 11/30/2012    Procedure: Gaynelle Arabian;  Surgeon: Bernestine Amass, MD;  Location: [facility];  Service: Urology;  Laterality: N/A;  1 HR EXAM UNDER ANESTHESIA, EXCISION OF SUB URETHRAL MESH, CYSTO, HOD   . Cysto with hydrodistension N/A 11/30/2012    Procedure: CYSTOSCOPY/HYDRODISTENSION;  Surgeon: Bernestine Amass, MD;  Location: [facility];  Service: Urology;  Laterality: N/A;   Social History:  reports that she quit  smoking about 22 months ago. Her smoking use included Cigarettes. She has a 7 pack-year smoking history. She has never used smokeless tobacco. She reports that she does not drink alcohol or use illicit drugs.  Allergies  Allergen Reactions  . Bactrim [Sulfamethoxazole-Trimethoprim] Other (See Comments)    Fever   . Percocet [Oxycodone-Acetaminophen] Itching  . Vicodin [Hydrocodone-Acetaminophen] Itching    Family History  Problem Relation Age of Onset  . Diabetes Mother   . Hypertension Mother   . Heart disease Mother   . Cancer Father      LUNG AND BRAIN  . Hypertension Father   . Heart disease Maternal Grandmother   . Cancer Maternal Grandfather     LUNG  . Heart disease Paternal Grandmother   . Cancer Paternal Grandfather     Prior to Admission medications   Medication Sig Start Date End Date Taking? Authorizing Provider  albuterol (PROVENTIL HFA;VENTOLIN HFA) 108 (90 BASE) MCG/ACT inhaler Inhale 2 puffs into the lungs every 6 (six) hours as needed. For shortness of breath 04/25/13  Yes Elayne Snare, MD  diazepam (VALIUM) 2 MG tablet Take 2 mg by mouth at bedtime.   Yes Historical Provider, MD  dicyclomine (BENTYL) 20 MG tablet Take 20 mg by mouth every 6 (six) hours.   Yes Historical Provider, MD  levothyroxine (SYNTHROID, LEVOTHROID) 137 MCG tablet Take 137 mcg by mouth every morning.    Yes Historical Provider, MD  meloxicam (MOBIC) 7.5 MG tablet Take 7.5 mg by mouth 2 (two) times daily as needed. 06/27/14  Yes Historical Provider, MD  omeprazole (PRILOSEC) 40 MG capsule Take 40 mg by mouth at bedtime.   Yes Historical Provider, MD  PARoxetine (PAXIL) 20 MG tablet Take 20 mg by mouth at bedtime.   Yes Historical Provider, MD  rOPINIRole (REQUIP) 2 MG tablet Take 2 mg by mouth at bedtime.   Yes Historical Provider, MD  traZODone (DESYREL) 50 MG tablet Take 50 mg by mouth at bedtime.   Yes Historical Provider, MD  valACYclovir (VALTREX) 500 MG tablet Take 500 mg by mouth at bedtime.   Yes Historical Provider, MD  ondansetron (ZOFRAN ODT) 4 MG disintegrating tablet 4mg  ODT q4 hours prn nausea/vomit Patient not taking: Reported on 09/18/2014 05/24/14   Verl Dicker, PA-C   Physical Exam: Filed Vitals:   09/18/14 1530 09/18/14 1545 09/18/14 1600 09/18/14 1625  BP: 133/82 128/74 130/76   Pulse: 76 81 77   Temp:    98.3 F (36.8 C)  Resp: 14 15 19    SpO2: 95% 94% 95%      General:  NAD  Eyes: PERRL, no scleral icterus  ENT: moist oropharynx  Cardiovascular: regular rate without MRG; 2+ peripheral pulses, no  JVD, no peripheral edema  Respiratory: CTA biL, good air movement without wheezing, rhonchi or crackles  Abdomen: soft, mild tenderness to palpation throughout, positive bowel sounds, no guarding, no rebound  Skin: no rashes  Musculoskeletal: normal bulk and tone, no joint swelling  Psychiatric: normal mood and affect  Neurologic: activates face symmetric, V2-3 decreased sensation on left, strength 3/5 left upper extremity, 0/5 left lower extremity, 5/5 on right  Labs on Admission:  Basic Metabolic Panel:  Recent Labs Lab 09/18/14 1339 09/18/14 1353  NA 139 139  K 4.2 4.1  CL 107 105  CO2 25  --   GLUCOSE 98 102*  BUN 20 24*  CREATININE 1.17* 1.20*  CALCIUM 9.1  --    Liver Function Tests:  Recent  Labs Lab 09/18/14 1339  AST 27  ALT 24  ALKPHOS 100  BILITOT 0.6  PROT 6.5  ALBUMIN 4.2   CBC:  Recent Labs Lab 09/18/14 1339 09/18/14 1353  WBC 6.6  --   NEUTROABS 4.1  --   HGB 12.7 13.9  HCT 36.5 41.0  MCV 84.3  --   PLT 247  --    Radiological Exams on Admission: Ct Head Wo Contrast  09/18/2014   CLINICAL DATA:  Code stroke, sudden onset LEFT shoulder weakness and LEFT chest pain, LEFT side weakness, history GERD, asthma, former smoker  EXAM: CT HEAD WITHOUT CONTRAST  TECHNIQUE: Contiguous axial images were obtained from the base of the skull through the vertex without intravenous contrast.  COMPARISON:  06/10/2012  FINDINGS: Normal ventricular morphology.  No midline shift or mass effect.  Normal appearance of brain parenchyma.  No intracranial hemorrhage, mass lesion or evidence acute infarction.  No extra-axial fluid collections.  Visualized paranasal sinuses and mastoid air cells clear.  New lytic lesion in LEFT frontal bone since 2013, 12 x 6 mm diameter.  No other definite focal osseous abnormalities.  IMPRESSION: No acute intracranial abnormalities.  New 12 x 6 mm lytic lesion in LEFT frontal bone since 2013, nonspecific; this could represent a metastatic  focus or myeloma ; fibrous dysplasia could cause the appearance that is identified though lesion is new since 2013.  Does patient have a history of malignancy or myeloma?  Critical Value/emergent results were called by telephone at the time of interpretation on 09/18/2014 at 1406 hr to Dr. Doy Mince from the Stroke Service who verbally acknowledged these results.   Electronically Signed   By: Lavonia Dana M.D.   On: 09/18/2014 14:06   Mr Brain Wo Contrast  09/18/2014   CLINICAL DATA:  49 year old female code stroke left side weakness. Initial encounter.  EXAM: MRI HEAD WITHOUT CONTRAST  TECHNIQUE: Multiplanar, multiecho pulse sequences of the brain and surrounding structures were obtained without intravenous contrast.  COMPARISON:  Head CT without contrast 1345 hr today, and earlier. Brain MRI 10/10/2004.  FINDINGS: No restricted diffusion to suggest acute infarction. Major intracranial vascular flow voids are stable.  No midline shift, mass effect, evidence of mass lesion, ventriculomegaly, extra-axial collection or acute intracranial hemorrhage. Cervicomedullary junction and pituitary are within normal limits. Pearline Cables and white matter signal is within normal limits for age throughout the brain.  Visualized bone marrow signal is largely within normal limits, however, there is a 10-12 mm focus of abnormal marrow signal along the left frontal convexity associated with abnormal diffusion signal (series 3, image 27 and series 5, image 14. This was also described on the earlier head CT today, and does appear to be new since 2005.  Negative visualized cervical spine with partial visualization of anterior fusion hardware and hardware susceptibility artifact.  Visible internal auditory structures appear normal. Visualized paranasal sinuses and mastoids are clear. Visualized orbit soft tissues are within normal limits. Visualized scalp soft tissues are within normal limits.  IMPRESSION: 1. No acute infarct. Negative for age  non contrast MRI appearance of the brain. 2. 10-12 mm skull lesion along the anterior frontal convexity, suspicious for bone metastasis or other hypercellular lesion and new since 2013.   Electronically Signed   By: Lars Pinks M.D.   On: 09/18/2014 15:39    EKG: Independently reviewed. Sinus rhythm  Assessment/Plan Active Problems:   Left hemiparesis   Weakness   Frontal skull lesion   Chest pain  AKI (acute kidney injury)   Chest pain - with left shoulder pain and left jaw pain - she has been having intermittent chest pains, not related to activity - will admit to telemetry - cycle cardiac enzymes - obtain a CXR  Left hemiparesis - MRI brain negative for CVA, ?conversion, she endorses being diagnosed with this in the past - monitor, PT consult - while in the ED recovering some in the left upper extremity  Frontal skull lesion - DD metastatic focus vs MM vs others, new since 2013 - 20 pack year smoking history, extensive lung cancer history in the family (all smokers though) - obtain a skeletal surgery, SPEP, UPEP, 2 view CXR, will consider CT chest if CXR abnormal  AKI - ?mild dehydration - IVF - repeat BMP in am  Headaches - pain medications prn  Tobacco abuse, in remission    Diet: regular Fluids: NS DVT Prophylaxis: Lovenox  Code Status: Full  Family Communication: d/w husband bedside  Disposition Plan: admit to telemetry   Time spent: 7  Costin M. Cruzita Lederer, MD [facility] Pager [phone]  If 7PM-7AM, please contact night-coverage [email or web address] Password [facility] 09/18/2014, 4:29 PM

## 2014-09-19 ENCOUNTER — Encounter (HOSPITAL_COMMUNITY): Payer: Self-pay | Admitting: Radiology

## 2014-09-19 ENCOUNTER — Observation Stay (HOSPITAL_COMMUNITY): Payer: BLUE CROSS/BLUE SHIELD

## 2014-09-19 LAB — CBC
HCT: 35.8 % — ABNORMAL LOW (ref 36.0–46.0)
Hemoglobin: 12.1 g/dL (ref 12.0–15.0)
MCH: 29.2 pg (ref 26.0–34.0)
MCHC: 33.8 g/dL (ref 30.0–36.0)
MCV: 86.5 fL (ref 78.0–100.0)
PLATELETS: 206 10*3/uL (ref 150–400)
RBC: 4.14 MIL/uL (ref 3.87–5.11)
RDW: 14.3 % (ref 11.5–15.5)
WBC: 5.1 10*3/uL (ref 4.0–10.5)

## 2014-09-19 LAB — COMPREHENSIVE METABOLIC PANEL
ALBUMIN: 3.5 g/dL (ref 3.5–5.2)
ALK PHOS: 80 U/L (ref 39–117)
ALT: 23 U/L (ref 0–35)
AST: 22 U/L (ref 0–37)
Anion gap: 6 (ref 5–15)
BUN: 19 mg/dL (ref 6–23)
CHLORIDE: 109 meq/L (ref 96–112)
CO2: 25 mmol/L (ref 19–32)
Calcium: 8.9 mg/dL (ref 8.4–10.5)
Creatinine, Ser: 0.8 mg/dL (ref 0.50–1.10)
GFR calc Af Amer: >90 mL/min (ref >90)
GFR calc non Af Amer: 86 mL/min — ABNORMAL LOW (ref >90)
Glucose, Bld: 94 mg/dL (ref 70–99)
POTASSIUM: 4.2 mmol/L (ref 3.5–5.1)
Sodium: 140 mmol/L (ref 135–145)
Total Bilirubin: 0.5 mg/dL (ref 0.3–1.2)
Total Protein: 5.8 g/dL — ABNORMAL LOW (ref 6.0–8.3)

## 2014-09-19 LAB — TROPONIN I
Troponin I: <0.03 ng/mL (ref <0.031)
Troponin I: <0.03 ng/mL (ref <0.031)

## 2014-09-19 MED ORDER — IOHEXOL 300 MG/ML  SOLN
100.0000 mL | Freq: Once | INTRAMUSCULAR | Status: AC | PRN
  Administered 2014-09-19: 100 mL via INTRAVENOUS

## 2014-09-19 MED ORDER — DIPHENHYDRAMINE HCL 25 MG PO CAPS
25.0000 mg | ORAL_CAPSULE | Freq: Three times a day (TID) | ORAL | Status: DC | PRN
  Administered 2014-09-19 – 2014-09-20 (×3): 25 mg via ORAL
  Filled 2014-09-19 (×5): qty 1

## 2014-09-19 NOTE — Plan of Care (Signed)
Problem: Phase II Progression Outcomes Goal: Progress activity as tolerated unless otherwise ordered Outcome: Completed/Met Date Met:  09/19/14 Patient is up standby assist to bathroom.

## 2014-09-19 NOTE — Progress Notes (Signed)
Pt c/o itching in her back. On-call provider Tylene Fantasia, NP notified via textpage . Awaiting orders.

## 2014-09-19 NOTE — Evaluation (Signed)
Physical Therapy Evaluation Patient Details Name: [name] MRN: [id number] DOB: 1966-08-05 Today's Date: 09/19/2014   History of Present Illness   [name] is a 49 y.o. female has a past medical history significant for Hypothyroidism, IBS, Depression, RLS, prior conversion episodes, migraine HA presents with sudden onset left shoulder pain, left neck pain followed by left sided weakness / numbness. 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 She was brought emergently in the ED and a code stroke was called.  MRI negative for acute event.  Clinical Impression  Pt admitted with/for left sided weakness.  Pt currently limited functionally due to the problems listed below.  (see problems list.)  Pt will benefit from PT to maximize function and safety to be able to get home safely with available assist of family .     Follow Up Recommendations No PT follow up;Supervision for mobility/OOB    Equipment Recommendations  None recommended by PT    Recommendations for Other Services       Precautions / Restrictions Precautions Precautions: Fall      Mobility  Bed Mobility Overal bed mobility: Needs Assistance Bed Mobility: Sidelying to Sit   Sidelying to sit: Supervision       General bed mobility comments: rolled over toward L after "popping" straight up was unsuccessful, but did not try to push up with L UE.  Powered up with R UE and trunk.  Transfers Overall transfer level: Needs assistance   Transfers: Sit to/from Stand Sit to Stand: Min guard         General transfer comment: pushed up R hand on bed with large truncal lean to right to come up.  Ambulation/Gait Ambulation/Gait assistance: Min guard Ambulation Distance (Feet): 150 Feet Assistive device:  (pushed IV pole) Gait Pattern/deviations: Step-through pattern Gait velocity: slower   General Gait Details: heel/toe  pattern with infrequent mild L knee snap back and unusual compensation for "LOB" by leaning only with upperbody in attempt to compensate for feeling off balance.  Stairs            Wheelchair Mobility    Modified Rankin (Stroke Patients Only)       Balance Overall balance assessment: Needs assistance Sitting-balance support: No upper extremity supported Sitting balance-Leahy Scale: Good Sitting balance - Comments: EOB putting on shoes   Standing balance support: No upper extremity supported Standing balance-Leahy Scale: Fair Standing balance comment: otherwise not tested                             Pertinent Vitals/Pain Pain Assessment: No/denies pain    Home Living Family/patient expects to be discharged to:: Private residence Living Arrangements: Spouse/significant other;Children Available Help at Discharge: Family (husband is disabled and at home)           Home Equipment: Environmental consultant - 2 wheels      Prior Function Level of Independence: Independent               Hand Dominance        Extremity/Trunk Assessment   Upper Extremity Assessment: Overall WFL for tasks assessed;LUE deficits/detail       LUE Deficits / Details: weaker grip, biceps/triceps, gross extension tested weakest   Lower Extremity Assessment: Overall WFL for tasks assessed;LLE deficits/detail   LLE Deficits / Details: quads faded over time with resistance, gross extension  weakest grossly 4/5  Cervical / Trunk Assessment: Normal  Communication   Communication: No difficulties  Cognition Arousal/Alertness: Awake/alert Behavior During Therapy: WFL for tasks assessed/performed Overall Cognitive Status: Within Functional Limits for tasks assessed                      General Comments General comments (skin integrity, edema, etc.): pt expressed that it was suggested that she had a conversion disorder in the past.    Exercises        Assessment/Plan     PT Assessment Patient needs continued PT services  PT Diagnosis Abnormality of gait;Other (comment) (L sided weakness)   PT Problem List Decreased strength;Decreased activity tolerance;Decreased balance;Decreased mobility;Decreased coordination  PT Treatment Interventions DME instruction;Gait training;Stair training;Functional mobility training;Therapeutic activities;Patient/family education   PT Goals (Current goals can be found in the Care Plan section) Acute Rehab PT Goals Patient Stated Goal: I really want to hear whats going on. PT Goal Formulation: With patient Time For Goal Achievement: 09/26/14 Potential to Achieve Goals: Good    Frequency Min 3X/week   Barriers to discharge        Co-evaluation               End of Session   Activity Tolerance: Patient tolerated treatment well Patient left: in bed;with call bell/phone within reach Nurse Communication: Mobility status    Functional Assessment Tool Used: clinical observation Functional Limitation: Mobility: Walking and moving around Mobility: Walking and Moving Around Current Status ([id number]): At least 1 percent but less than 20 percent impaired, limited or restricted Mobility: Walking and Moving Around Goal Status [phone]): At least 1 percent but less than 20 percent impaired, limited or restricted    Time: 0933-0958 PT Time Calculation (min) (ACUTE ONLY): 25 min   Charges:   PT Evaluation $Initial PT Evaluation Tier I: 1 Procedure PT Treatments $Gait Training: 8-22 mins   PT G Codes:   PT G-Codes **NOT FOR INPATIENT CLASS** Functional Assessment Tool Used: clinical observation Functional Limitation: Mobility: Walking and moving around Mobility: Walking and Moving Around Current Status (O5366): At least 1 percent but less than 20 percent impaired, limited or restricted Mobility: Walking and Moving Around Goal Status [phone]): At least 1 percent but less than 20 percent impaired, limited or restricted     , [name] 09/19/2014, 10:17 AM  09/19/2014  Donnella Sham, PT [phone]  (pager)

## 2014-09-19 NOTE — Progress Notes (Signed)
PROGRESS NOTE  [name] SUP:[id number] DOB: 05/03/1966 DOA: 09/18/2014 PCP: Maurice Small, D, MD  Assessment/Plan: left hemiparesis -MRI brain negative for stroke -Question conversion disorder -On the day of discharge, the patient was able to handle it with physical therapy without difficulty -Physical therapy did not recommend any further follow-up -Neurology was consulted during the hospitalization, they did not recommend any further workup -serum B12, RBC folate  Atypical chest pain -Troponins negativex3 -EKG without any concerning ischemic changes Frontal skull lesion -CT brain revealed 12 x 6 mm left frontal lytic lesion -Skeletal survey shows a vague lucency without any other bony lesions -Patient has never had colonoscopy -Mammogram 2013 negative -Follow up serum protein immunofixation in the outpatient setting  -may ultimately need bone marrow biopsy, but this can be deferred to the outpatient setting AKI -volume depletion -improved with IVF Abnormal CXR -09/18/2014 chest x-ray shows Opacity along the right peritracheal stripe could reflect a mass or adenopathy -CT chest Abd pain -CT abdomen and pelvis given pt has lytic lesion on skull     Family Communication:   Pt at beside Disposition Plan:   Home when medically stable       Procedures/Studies: Dg Chest 2 View  09/18/2014   CLINICAL DATA:  Headache today. Frontal skull lesion noted on current CT and MRI  EXAM: CHEST  2 VIEW  COMPARISON:  07/22/2009.  FINDINGS: There is right peritracheal soft tissue prominence that may be vascular. Adenopathy or mass is possible.  Mediastinum otherwise normal in contour. Heart normal in size. No hilar masses.  Clear lungs.  No pleural effusion or pneumothorax.  Status post low anterior cervical spine fusion new since the prior study. Bony thorax otherwise unremarkable.  IMPRESSION: Opacity along the right peritracheal stripe could reflect a mass or adenopathy.  It may, alternatively, all be vascular. Recommend followup chest CT.  Clear lungs.   Electronically Signed   By: Lajean Manes M.D.   On: 09/18/2014 21:48   Ct Head Wo Contrast  09/18/2014   CLINICAL DATA:  Code stroke, sudden onset LEFT shoulder weakness and LEFT chest pain, LEFT side weakness, history GERD, asthma, former smoker  EXAM: CT HEAD WITHOUT CONTRAST  TECHNIQUE: Contiguous axial images were obtained from the base of the skull through the vertex without intravenous contrast.  COMPARISON:  06/10/2012  FINDINGS: Normal ventricular morphology.  No midline shift or mass effect.  Normal appearance of brain parenchyma.  No intracranial hemorrhage, mass lesion or evidence acute infarction.  No extra-axial fluid collections.  Visualized paranasal sinuses and mastoid air cells clear.  New lytic lesion in LEFT frontal bone since 2013, 12 x 6 mm diameter.  No other definite focal osseous abnormalities.  IMPRESSION: No acute intracranial abnormalities.  New 12 x 6 mm lytic lesion in LEFT frontal bone since 2013, nonspecific; this could represent a metastatic focus or myeloma ; fibrous dysplasia could cause the appearance that is identified though lesion is new since 2013.  Does patient have a history of malignancy or myeloma?  Critical Value/emergent results were called by telephone at the time of interpretation on 09/18/2014 at 1406 hr to Dr. Doy Mince from the Stroke Service who verbally acknowledged these results.   Electronically Signed   By: Lavonia Dana M.D.   On: 09/18/2014 14:06   Mr Brain Wo Contrast  09/18/2014   CLINICAL DATA:  49 year old female code stroke left side weakness. Initial encounter.  EXAM: MRI HEAD WITHOUT  CONTRAST  TECHNIQUE: Multiplanar, multiecho pulse sequences of the brain and surrounding structures were obtained without intravenous contrast.  COMPARISON:  Head CT without contrast 1345 hr today, and earlier. Brain MRI 10/10/2004.  FINDINGS: No restricted diffusion to suggest acute  infarction. Major intracranial vascular flow voids are stable.  No midline shift, mass effect, evidence of mass lesion, ventriculomegaly, extra-axial collection or acute intracranial hemorrhage. Cervicomedullary junction and pituitary are within normal limits. Gray and white matter signal is within normal limits for age throughout the brain.  Visualized bone marrow signal is largely within normal limits, however, there is a 10-12 mm focus of abnormal marrow signal along the left frontal convexity associated with abnormal diffusion signal (series 3, image 27 and series 5, image 14. This was also described on the earlier head CT today, and does appear to be new since 2005.  Negative visualized cervical spine with partial visualization of anterior fusion hardware and hardware susceptibility artifact.  Visible internal auditory structures appear normal. Visualized paranasal sinuses and mastoids are clear. Visualized orbit soft tissues are within normal limits. Visualized scalp soft tissues are within normal limits.  IMPRESSION: 1. No acute infarct. Negative for age non contrast MRI appearance of the brain. 2. 10-12 mm skull lesion along the anterior frontal convexity, suspicious for bone metastasis or other hypercellular lesion and new since 2013.   Electronically Signed   By: Lee  Hall M.D.   On: 09/18/2014 15:39   Dg Bone Survey Met  09/18/2014   CLINICAL DATA:  Frontal skull lesion noted on current head CT and brain MRI. Headache today.  EXAM: METASTATIC BONE SURVEY  COMPARISON:  None.  FINDINGS: Vague area of lucency suggested along the left frontal region on the frontal skull view. This may reflect the lesion seen on the current CT and brain MRI. Margins are ill-defined.  No other evidence of a skull lesion.  Elsewhere in the skeleton, there are no radiolucent/osteolytic lesions. There are no osteoblastic lesions.  Status post into cervical spine fusion from C4 through C7. Orthopedic hardware is well-seated and  aligned.  Status post L5-S1 posterior lumbar spine fusion with orthopedic hardware well-seated and aligned.  Bones are normally mineralized. No significant soft tissue abnormality.  IMPRESSION: 1. Lesions seen in the left frontal bone on the current CT and brain MRI is suggested by a vague area of lucency on the frontal skull thumb. 2. No other evidence of a bone lesion. Lack of other bone lesions argues against metastatic or neoplastic disease.   Electronically Signed   By:   Ormond M.D.   On: 09/18/2014 21:53         Subjective: Patient complains of left upper extremity or left lower extremity weakness, but has significantly improved. Denies any headaches, visual disturbance, chest pain, shortness breath, nausea, vomiting, diarrhea, abdominal pain.  Objective: Filed Vitals:   09/19/14 0015 09/19/14 0209 09/19/14 0543 09/19/14 1436  BP: 115/67 99/66 92/60 114/65  Pulse: 59 65 66 75  Temp: 97.6 F (36.4 C) 97.8 F (36.6 C) 97.9 F (36.6 C) 98.7 F (37.1 C)  TempSrc: Oral Oral Oral Oral  Resp: 14 18 18 16  Height:      Weight:      SpO2: 96% 92% 95% 95%    Intake/Output Summary (Last 24 hours) at 09/19/14 1836 Last data filed at 09/19/14 1758  Gross per 24 hour  Intake   3054 ml  Output   5850 ml  Net  -2796 ml   Weight change:    Exam:   General:  Pt is alert, follows commands appropriately, not in acute distress  HEENT: No icterus, No thrush,[place]/AT  Cardiovascular: RRR, S1/S2, no rubs, no gallops  Respiratory: CTA bilaterally, no wheezing, no crackles, no rhonchi  Abdomen: Soft/+BS, non tender, non distended, no guarding  Extremities: No edema, No lymphangitis, No petechiae, No rashes, no synovitis  Data Reviewed: Basic Metabolic Panel:  Recent Labs Lab 09/18/14 1339 09/18/14 1353 09/19/14 0415  NA 139 139 140  K 4.2 4.1 4.2  CL 107 105 109  CO2 25  --  25  GLUCOSE 98 102* 94  BUN 20 24* 19  CREATININE 1.17* 1.20* 0.80  CALCIUM 9.1  --  8.9    Liver Function Tests:  Recent Labs Lab 09/18/14 1339 09/19/14 0415  AST 27 22  ALT 24 23  ALKPHOS 100 80  BILITOT 0.6 0.5  PROT 6.5 5.8*  ALBUMIN 4.2 3.5   No results for input(s): LIPASE, AMYLASE in the last 168 hours. No results for input(s): AMMONIA in the last 168 hours. CBC:  Recent Labs Lab 09/18/14 1339 09/18/14 1353 09/19/14 0415  WBC 6.6  --  5.1  NEUTROABS 4.1  --   --   HGB 12.7 13.9 12.1  HCT 36.5 41.0 35.8*  MCV 84.3  --  86.5  PLT 247  --  206   Cardiac Enzymes:  Recent Labs Lab 09/18/14 1633 09/19/14 09/19/14 0415  TROPONINI <0.03 <0.03 <0.03   BNP: Invalid input(s): POCBNP CBG: No results for input(s): GLUCAP in the last 168 hours.  No results found for this or any previous visit (from the past 240 hour(s)).   Scheduled Meds: . diazepam  2 mg Oral QHS  . dicyclomine  20 mg Oral 4 times per day  . enoxaparin (LOVENOX) injection  40 mg Subcutaneous Q24H  . levothyroxine  137 mcg Oral q morning - 10a  . pantoprazole  40 mg Oral Daily  . PARoxetine  20 mg Oral QHS  . rOPINIRole  2 mg Oral QHS  . sodium chloride  3 mL Intravenous Q12H  . traZODone  50 mg Oral QHS  . valACYclovir  500 mg Oral QHS   Continuous Infusions: . sodium chloride 75 mL/hr at 09/19/14 0751     , , DO  [facility] Pager [phone]  If 7PM-7AM, please contact night-coverage [email or web address] Password [email or web address] 09/19/2014, 6:36 PM   LOS: 1 day  

## 2014-09-19 NOTE — Progress Notes (Signed)
UR completed 

## 2014-09-20 DIAGNOSIS — Z885 Allergy status to narcotic agent status: Secondary | ICD-10-CM | POA: Diagnosis not present

## 2014-09-20 DIAGNOSIS — E039 Hypothyroidism, unspecified: Secondary | ICD-10-CM | POA: Diagnosis present

## 2014-09-20 DIAGNOSIS — G8929 Other chronic pain: Secondary | ICD-10-CM | POA: Diagnosis present

## 2014-09-20 DIAGNOSIS — R599 Enlarged lymph nodes, unspecified: Secondary | ICD-10-CM

## 2014-09-20 DIAGNOSIS — Z90710 Acquired absence of both cervix and uterus: Secondary | ICD-10-CM | POA: Diagnosis not present

## 2014-09-20 DIAGNOSIS — N179 Acute kidney failure, unspecified: Secondary | ICD-10-CM | POA: Diagnosis present

## 2014-09-20 DIAGNOSIS — R0789 Other chest pain: Secondary | ICD-10-CM

## 2014-09-20 DIAGNOSIS — N3010 Interstitial cystitis (chronic) without hematuria: Secondary | ICD-10-CM | POA: Diagnosis present

## 2014-09-20 DIAGNOSIS — R109 Unspecified abdominal pain: Secondary | ICD-10-CM | POA: Diagnosis present

## 2014-09-20 DIAGNOSIS — M899 Disorder of bone, unspecified: Secondary | ICD-10-CM

## 2014-09-20 DIAGNOSIS — J45909 Unspecified asthma, uncomplicated: Secondary | ICD-10-CM | POA: Diagnosis present

## 2014-09-20 DIAGNOSIS — Z79899 Other long term (current) drug therapy: Secondary | ICD-10-CM | POA: Diagnosis not present

## 2014-09-20 DIAGNOSIS — R011 Cardiac murmur, unspecified: Secondary | ICD-10-CM | POA: Diagnosis present

## 2014-09-20 DIAGNOSIS — K219 Gastro-esophageal reflux disease without esophagitis: Secondary | ICD-10-CM | POA: Diagnosis present

## 2014-09-20 DIAGNOSIS — F329 Major depressive disorder, single episode, unspecified: Secondary | ICD-10-CM | POA: Diagnosis present

## 2014-09-20 DIAGNOSIS — R569 Unspecified convulsions: Secondary | ICD-10-CM | POA: Diagnosis present

## 2014-09-20 DIAGNOSIS — G8194 Hemiplegia, unspecified affecting left nondominant side: Secondary | ICD-10-CM | POA: Diagnosis present

## 2014-09-20 DIAGNOSIS — R591 Generalized enlarged lymph nodes: Secondary | ICD-10-CM | POA: Diagnosis present

## 2014-09-20 DIAGNOSIS — Z881 Allergy status to other antibiotic agents status: Secondary | ICD-10-CM | POA: Diagnosis not present

## 2014-09-20 DIAGNOSIS — Z87891 Personal history of nicotine dependence: Secondary | ICD-10-CM | POA: Diagnosis not present

## 2014-09-20 DIAGNOSIS — F419 Anxiety disorder, unspecified: Secondary | ICD-10-CM | POA: Diagnosis present

## 2014-09-20 DIAGNOSIS — Z981 Arthrodesis status: Secondary | ICD-10-CM | POA: Diagnosis not present

## 2014-09-20 DIAGNOSIS — B009 Herpesviral infection, unspecified: Secondary | ICD-10-CM | POA: Diagnosis present

## 2014-09-20 DIAGNOSIS — E860 Dehydration: Secondary | ICD-10-CM | POA: Diagnosis present

## 2014-09-20 DIAGNOSIS — K589 Irritable bowel syndrome without diarrhea: Secondary | ICD-10-CM | POA: Diagnosis present

## 2014-09-20 DIAGNOSIS — G2581 Restless legs syndrome: Secondary | ICD-10-CM | POA: Diagnosis present

## 2014-09-20 DIAGNOSIS — G43909 Migraine, unspecified, not intractable, without status migrainosus: Secondary | ICD-10-CM | POA: Diagnosis present

## 2014-09-20 DIAGNOSIS — M549 Dorsalgia, unspecified: Secondary | ICD-10-CM | POA: Diagnosis present

## 2014-09-20 DIAGNOSIS — G8190 Hemiplegia, unspecified affecting unspecified side: Secondary | ICD-10-CM | POA: Diagnosis present

## 2014-09-20 DIAGNOSIS — R209 Unspecified disturbances of skin sensation: Secondary | ICD-10-CM | POA: Diagnosis present

## 2014-09-20 LAB — BASIC METABOLIC PANEL
ANION GAP: 7 (ref 5–15)
BUN: 12 mg/dL (ref 6–23)
CO2: 26 mmol/L (ref 19–32)
Calcium: 8.9 mg/dL (ref 8.4–10.5)
Chloride: 106 mEq/L (ref 96–112)
Creatinine, Ser: 0.83 mg/dL (ref 0.50–1.10)
GFR calc Af Amer: >90 mL/min (ref >90)
GFR calc non Af Amer: 83 mL/min — ABNORMAL LOW (ref >90)
GLUCOSE: 109 mg/dL — AB (ref 70–99)
POTASSIUM: 4.4 mmol/L (ref 3.5–5.1)
Sodium: 139 mmol/L (ref 135–145)

## 2014-09-20 LAB — PROTEIN ELECTROPHORESIS, SERUM
ALBUMIN ELP: 62.9 % (ref 55.8–66.1)
ALPHA-2-GLOBULIN: 10.4 % (ref 7.1–11.8)
Alpha-1-Globulin: 4.4 % (ref 2.9–4.9)
BETA GLOBULIN: 5.8 % (ref 4.7–7.2)
Beta 2: 4.7 % (ref 3.2–6.5)
Gamma Globulin: 11.8 % (ref 11.1–18.8)
M-Spike, %: NOT DETECTED g/dL
TOTAL PROTEIN ELP: 6.6 g/dL (ref 6.0–8.3)

## 2014-09-20 LAB — FOLATE RBC
Folate, Hemolysate: 301.3 ng/mL
Folate, RBC: 837 ng/mL (ref >498)
HEMATOCRIT: 36 % (ref 34.0–46.6)

## 2014-09-20 LAB — VITAMIN B12: Vitamin B-12: 353 pg/mL (ref 211–911)

## 2014-09-20 NOTE — Progress Notes (Signed)
PT Cancellation Note  Patient Details Name: [name] MRN: [id number] DOB: 03-19-1966   Cancelled Treatment:    Reason Eval/Treat Not Completed: Patient declined, no reason specified.  Pt states that she is essentially feeling functionally back to her normal and has been up by herself to bathroom.  Here now just to get biopsies tomorrow.  Will defer all further treatment due to no needs and sign off at this time. 09/20/2014  [name], [place] [phone]  (pager)   , [name] 09/20/2014, 2:58 PM

## 2014-09-20 NOTE — Progress Notes (Signed)
CT chest reviewed with Dr. Lamonte Sakai.  She has been scheduled for bronchoscopy with EBUS for 9 am on 09/21/14.  Will make NPO after midnight, and hold lovenox.  Chesley Mires, MD [facility] Pulmonary/Critical Care 09/20/2014, 10:39 AM Pager:  [phone] After 3pm call: [phone]

## 2014-09-20 NOTE — Consult Note (Signed)
 Name: [name] MRN: [id number] DOB: 06/07/1966    ADMISSION DATE:  09/18/2014 CONSULTATION DATE:  09/20/2014  REFERRING MD :  Tat  CHIEF COMPLAINT:  Abnormal CT chest  BRIEF PATIENT DESCRIPTION:  48 yo female former smoker presented with Lt sided numbness with possible conversion disorder.  She was noted to have abnormal CXR.  She had CT chest with showed mediastinal/hilar LAN and PCCM consulted.  SIGNIFICANT EVENTS  1/19 Admit, neurology see and sign off  STUDIES:  1/19 CT head >> 12 x 6 mm Lt frontal lytic lesion 1/19 MRI brain >> 12 mm Lt frontal lytic lesion 1/19 Bone scan >> no other lesions 1/20 CT chest >> Rt paratracheal LAN 1.5 cm, Rt subcarinal LAN 2 cm, Rt hilar LAN 1.4 cm, Lt hilar LAN 1.7 cm 1/20 CT abd/pelvis >> 1.6 cm LAN at gastrohepatic ligament, 1.3 cm LAN at portal vein, 0.9 cm LAN Rt renal artery   HISTORY OF PRESENT ILLNESS:   She presented with weakness.  There was concern for stroke, but her evaluation for stroke was negative.  She had CXR which showed hilar fullness, and CT head which showed frontal lytic lesion.  She had CT chest which showed findings detailed above.  She gets occasional cough, wheeze, and sharp chest pain.  She has gland swelling in her neck intermittently, but denies facial swelling.  She denies skin rashes, or joint pain.  She has not had recent fever, and denies recent respiratory infections.  PAST MEDICAL HISTORY :   has a past medical history of IC (interstitial cystitis); RLS (restless legs syndrome); GERD (gastroesophageal reflux disease); Depression; Hypothyroidism; Environmental allergies; IBS (irritable bowel syndrome); Frequency of urination; Nocturia; SUI (stress urinary incontinence, female); Erosion of suburethral sling; Herpes simplex type 2 infection; NSVD (normal spontaneous vaginal delivery); Asthma; Heart murmur; Chronic bronchitis; Migraine; Headache; Seizures; Arthritis; Chronic back pain; and Anxiety.  has past  surgical history that includes Cesarean section (2000); Pelvic laparoscopy (1990's); Tonsillectomy (1986); Appendectomy (1985); Anterior cervical decomp/discectomy fusion (2011); Abdominal hysterectomy (2006); Knee arthroscopy (Right); Lumbar fusion (12-04-2008); Lumbar disc surgery (02-02-2007); LYNX RETROPUBIC SUBURETHRAL SLING (03-02-2007); Back surgery (2008); Tubal ligation (2001); Pubovaginal sling (N/A, 11/30/2012); and cysto with hydrodistension (N/A, 11/30/2012). Prior to Admission medications   Medication Sig Start Date End Date Taking? Authorizing Provider  albuterol (PROVENTIL HFA;VENTOLIN HFA) 108 (90 BASE) MCG/ACT inhaler Inhale 2 puffs into the lungs every 6 (six) hours as needed. For shortness of breath 04/25/13  Yes Ajay Kumar, MD  diazepam (VALIUM) 2 MG tablet Take 2 mg by mouth at bedtime.   Yes Historical Provider, MD  dicyclomine (BENTYL) 20 MG tablet Take 20 mg by mouth every 6 (six) hours.   Yes Historical Provider, MD  levothyroxine (SYNTHROID, LEVOTHROID) 137 MCG tablet Take 137 mcg by mouth every morning.    Yes Historical Provider, MD  meloxicam (MOBIC) 7.5 MG tablet Take 7.5 mg by mouth 2 (two) times daily as needed. 06/27/14  Yes Historical Provider, MD  omeprazole (PRILOSEC) 40 MG capsule Take 40 mg by mouth at bedtime.   Yes Historical Provider, MD  PARoxetine (PAXIL) 20 MG tablet Take 20 mg by mouth at bedtime.   Yes Historical Provider, MD  rOPINIRole (REQUIP) 2 MG tablet Take 2 mg by mouth at bedtime.   Yes Historical Provider, MD  traZODone (DESYREL) 50 MG tablet Take 50 mg by mouth at bedtime.   Yes Historical Provider, MD  valACYclovir (VALTREX) 500 MG tablet Take 500 mg by mouth at   bedtime.   Yes Historical Provider, MD  ondansetron (ZOFRAN ODT) 4 MG disintegrating tablet 4mg ODT q4 hours prn nausea/vomit Patient not taking: Reported on 09/18/2014 05/24/14   Benjamin W Cartner, PA-C   Allergies  Allergen Reactions  . Bactrim [Sulfamethoxazole-Trimethoprim] Other (See  Comments)    Fever   . Percocet [Oxycodone-Acetaminophen] Itching  . Vicodin [Hydrocodone-Acetaminophen] Itching    FAMILY HISTORY:  family history includes Cancer in her father, maternal grandfather, and paternal grandfather; Diabetes in her mother; Heart disease in her maternal grandmother, mother, and paternal grandmother; Hypertension in her father and mother. SOCIAL HISTORY:  reports that she quit smoking about 22 months ago. Her smoking use included Cigarettes. She has a 7 pack-year smoking history. She has never used smokeless tobacco. She reports that she does not drink alcohol or use illicit drugs.  REVIEW OF SYSTEMS:   Negative except above  SUBJECTIVE:   VITAL SIGNS: Temp:  [98.4 F (36.9 C)-99.5 F (37.5 C)] 99.5 F (37.5 C) (01/21 0536) Pulse Rate:  [73-79] 73 (01/21 0536) Resp:  [16-18] 18 (01/21 0536) BP: (108-116)/(62-65) 116/64 mmHg (01/21 0536) SpO2:  [93 %-95 %] 93 % (01/21 0536)  PHYSICAL EXAMINATION: General:  Pleasant, no distress Neuro:  Alert, normal strength, CN intact HEENT:  Wears glasses, pupils reactive, no LAN, MP 2, no oral lesions Cardiovascular: regular, no murmur Lungs:  No wheeze/rales/dullness Abdomen:  Soft, no organomegaly, +bowel sounds Musculoskeletal: no edema Skin: no rashes  CBC Recent Labs     09/18/14  1339  09/18/14  1353  09/19/14  0415  WBC  6.6   --   5.1  HGB  12.7  13.9  12.1  HCT  36.5  41.0  35.8*  PLT  247   --   206    Coag's Recent Labs     09/18/14  1339  APTT  29  INR  0.96    BMET Recent Labs     09/18/14  1339  09/18/14  1353  09/19/14  0415  09/20/14  0730  NA  139  139  140  139  K  4.2  4.1  4.2  4.4  CL  107  105  109  106  CO2  25   --   25  26  BUN  20  24*  19  12  CREATININE  1.17*  1.20*  0.80  0.83  GLUCOSE  98  102*  94  109*    Electrolytes Recent Labs     09/18/14  1339  09/19/14  0415  09/20/14  0730  CALCIUM  9.1  8.9  8.9    Liver Enzymes Recent Labs      09/18/14  1339  09/19/14  0415  AST  27  22  ALT  24  23  ALKPHOS  100  80  BILITOT  0.6  0.5  ALBUMIN  4.2  3.5    Cardiac Enzymes Recent Labs     09/18/14  1633 09/19/14  09/19/14  0415  TROPONINI  <0.03  <0.03  <0.03    Imaging Dg Chest 2 View  09/18/2014   CLINICAL DATA:  Headache today. Frontal skull lesion noted on current CT and MRI  EXAM: CHEST  2 VIEW  COMPARISON:  07/22/2009.  FINDINGS: There is right peritracheal soft tissue prominence that may be vascular. Adenopathy or mass is possible.  Mediastinum otherwise normal in contour. Heart normal in size. No hilar masses.  Clear lungs.  No   pleural effusion or pneumothorax.  Status post low anterior cervical spine fusion new since the prior study. Bony thorax otherwise unremarkable.  IMPRESSION: Opacity along the right peritracheal stripe could reflect a mass or adenopathy. It may, alternatively, all be vascular. Recommend followup chest CT.  Clear lungs.   Electronically Signed   By: David  Ormond M.D.   On: 09/18/2014 21:48   Ct Head Wo Contrast  09/18/2014   CLINICAL DATA:  Code stroke, sudden onset LEFT shoulder weakness and LEFT chest pain, LEFT side weakness, history GERD, asthma, former smoker  EXAM: CT HEAD WITHOUT CONTRAST  TECHNIQUE: Contiguous axial images were obtained from the base of the skull through the vertex without intravenous contrast.  COMPARISON:  06/10/2012  FINDINGS: Normal ventricular morphology.  No midline shift or mass effect.  Normal appearance of brain parenchyma.  No intracranial hemorrhage, mass lesion or evidence acute infarction.  No extra-axial fluid collections.  Visualized paranasal sinuses and mastoid air cells clear.  New lytic lesion in LEFT frontal bone since 2013, 12 x 6 mm diameter.  No other definite focal osseous abnormalities.  IMPRESSION: No acute intracranial abnormalities.  New 12 x 6 mm lytic lesion in LEFT frontal bone since 2013, nonspecific; this could represent a metastatic focus or  myeloma ; fibrous dysplasia could cause the appearance that is identified though lesion is new since 2013.  Does patient have a history of malignancy or myeloma?  Critical Value/emergent results were called by telephone at the time of interpretation on 09/18/2014 at 1406 hr to Dr. Reynolds from the Stroke Service who verbally acknowledged these results.   Electronically Signed   By: Mark  Boles M.D.   On: 09/18/2014 14:06   Ct Chest W Contrast  09/19/2014   CLINICAL DATA:  Thickened right paratracheal stripe on previous day's chest radiograph. Solitary lytic left frontal bone lesion on recent CT of the head and brain MRI.  EXAM: CT CHEST, ABDOMEN, AND PELVIS WITH CONTRAST  TECHNIQUE: Multidetector CT imaging of the chest, abdomen and pelvis was performed following the standard protocol during bolus administration of intravenous contrast.  CONTRAST:  100mL OMNIPAQUE IOHEXOL 300 MG/ML  SOLN  COMPARISON:  CT abdomen pelvis, 05/24/2014. Previous day's chest radiograph.  FINDINGS: CT CHEST FINDINGS  There is mediastinal and bilateral hilar adenopathy. Several reference measurements were made. Nodes pending from the AP window to the prevascular space adjacent to the aortic arch measures 16 mm in short axis. Right peritracheal node approximately 3 cm above the chronic measures 15 mm in short axis. Right subcarinal node measures 2 cm in short axis. Right inferolateral hilar node measures 14 mm in short axis. Left infrahilar node measures 17 mm in short axis.  No neck base or axillary masses or adenopathy.  Heart normal in size and configuration. No coronary artery calcifications. Great vessels are within normal limits.  Lungs are clear.  No pleural effusion.  CT ABDOMEN AND PELVIS FINDINGS  There is upper abdominal adenopathy. Node just superior to the portal vein along the gastrohepatic ligament measures 16 mm in short axis. Node anterior to the portal vein measures 13 mm short axis. Node anterior to the aorta at the  level of the right renal artery measures 9 mm in short axis. Several other prominent nodes are noted along the retroperitoneum adjacent to the aorta and inferior vena cava. No enlarged pelvic lymph nodes. No enlarged mesenteric lymph nodes.  Liver, spleen, gallbladder, pancreas, adrenal glands, kidneys, ureters, bladder: Normal.  Uterus surgically absent.    No pelvic masses.  No ascites.  Colon and small bowel are unremarkable.  MUSCULOSKELETAL  No osteoblastic or osteolytic lesions. Status post posterior spinal fusion from L4 through S1. Orthopedic hardware well-seated and aligned.  IMPRESSION: 1. Mediastinal and hilar adenopathy as well of adenopathy in the upper abdomen most evident along the gastrohepatic ligament/porta hepatis. 2. No abnormality of the lungs.  No interstitial lung disease. 3. No abdominal organ abnormality. 4. No lytic bone lesions seen on the included field of view. 5. Differential diagnosis for the adenopathy includes sarcoidosis which could cause the significant mediastinal and hilar adenopathy as well as the upper abdominal adenopathy, and can cause lytic bone lesions. Lymphoma is also in the differential diagnosis.   Electronically Signed   By: David  Ormond M.D.   On: 09/19/2014 20:06   Mr Brain Wo Contrast  09/18/2014   CLINICAL DATA:  48-year-old female code stroke left side weakness. Initial encounter.  EXAM: MRI HEAD WITHOUT CONTRAST  TECHNIQUE: Multiplanar, multiecho pulse sequences of the brain and surrounding structures were obtained without intravenous contrast.  COMPARISON:  Head CT without contrast 1345 hr today, and earlier. Brain MRI 10/10/2004.  FINDINGS: No restricted diffusion to suggest acute infarction. Major intracranial vascular flow voids are stable.  No midline shift, mass effect, evidence of mass lesion, ventriculomegaly, extra-axial collection or acute intracranial hemorrhage. Cervicomedullary junction and pituitary are within normal limits. Gray and white matter  signal is within normal limits for age throughout the brain.  Visualized bone marrow signal is largely within normal limits, however, there is a 10-12 mm focus of abnormal marrow signal along the left frontal convexity associated with abnormal diffusion signal (series 3, image 27 and series 5, image 14. This was also described on the earlier head CT today, and does appear to be new since 2005.  Negative visualized cervical spine with partial visualization of anterior fusion hardware and hardware susceptibility artifact.  Visible internal auditory structures appear normal. Visualized paranasal sinuses and mastoids are clear. Visualized orbit soft tissues are within normal limits. Visualized scalp soft tissues are within normal limits.  IMPRESSION: 1. No acute infarct. Negative for age non contrast MRI appearance of the brain. 2. 10-12 mm skull lesion along the anterior frontal convexity, suspicious for bone metastasis or other hypercellular lesion and new since 2013.   Electronically Signed   By: Lee  Hall M.D.   On: 09/18/2014 15:39   Ct Abdomen Pelvis W Contrast  09/19/2014   CLINICAL DATA:  Thickened right paratracheal stripe on previous day's chest radiograph. Solitary lytic left frontal bone lesion on recent CT of the head and brain MRI.  EXAM: CT CHEST, ABDOMEN, AND PELVIS WITH CONTRAST  TECHNIQUE: Multidetector CT imaging of the chest, abdomen and pelvis was performed following the standard protocol during bolus administration of intravenous contrast.  CONTRAST:  100mL OMNIPAQUE IOHEXOL 300 MG/ML  SOLN  COMPARISON:  CT abdomen pelvis, 05/24/2014. Previous day's chest radiograph.  FINDINGS: CT CHEST FINDINGS  There is mediastinal and bilateral hilar adenopathy. Several reference measurements were made. Nodes pending from the AP window to the prevascular space adjacent to the aortic arch measures 16 mm in short axis. Right peritracheal node approximately 3 cm above the chronic measures 15 mm in short axis.  Right subcarinal node measures 2 cm in short axis. Right inferolateral hilar node measures 14 mm in short axis. Left infrahilar node measures 17 mm in short axis.  No neck base or axillary masses or adenopathy.  Heart normal in size   and configuration. No coronary artery calcifications. Great vessels are within normal limits.  Lungs are clear.  No pleural effusion.  CT ABDOMEN AND PELVIS FINDINGS  There is upper abdominal adenopathy. Node just superior to the portal vein along the gastrohepatic ligament measures 16 mm in short axis. Node anterior to the portal vein measures 13 mm short axis. Node anterior to the aorta at the level of the right renal artery measures 9 mm in short axis. Several other prominent nodes are noted along the retroperitoneum adjacent to the aorta and inferior vena cava. No enlarged pelvic lymph nodes. No enlarged mesenteric lymph nodes.  Liver, spleen, gallbladder, pancreas, adrenal glands, kidneys, ureters, bladder: Normal.  Uterus surgically absent.  No pelvic masses.  No ascites.  Colon and small bowel are unremarkable.  MUSCULOSKELETAL  No osteoblastic or osteolytic lesions. Status post posterior spinal fusion from L4 through S1. Orthopedic hardware well-seated and aligned.  IMPRESSION: 1. Mediastinal and hilar adenopathy as well of adenopathy in the upper abdomen most evident along the gastrohepatic ligament/porta hepatis. 2. No abnormality of the lungs.  No interstitial lung disease. 3. No abdominal organ abnormality. 4. No lytic bone lesions seen on the included field of view. 5. Differential diagnosis for the adenopathy includes sarcoidosis which could cause the significant mediastinal and hilar adenopathy as well as the upper abdominal adenopathy, and can cause lytic bone lesions. Lymphoma is also in the differential diagnosis.   Electronically Signed   By: Lajean Manes M.D.   On: 09/19/2014 20:06   Dg Bone Survey Met  09/18/2014   CLINICAL DATA:  Frontal skull lesion noted on  current head CT and brain MRI. Headache today.  EXAM: METASTATIC BONE SURVEY  COMPARISON:  None.  FINDINGS: Vague area of lucency suggested along the left frontal region on the frontal skull view. This may reflect the lesion seen on the current CT and brain MRI. Margins are ill-defined.  No other evidence of a skull lesion.  Elsewhere in the skeleton, there are no radiolucent/osteolytic lesions. There are no osteoblastic lesions.  Status post into cervical spine fusion from C4 through C7. Orthopedic hardware is well-seated and aligned.  Status post L5-S1 posterior lumbar spine fusion with orthopedic hardware well-seated and aligned.  Bones are normally mineralized. No significant soft tissue abnormality.  IMPRESSION: 1. Lesions seen in the left frontal bone on the current CT and brain MRI is suggested by a vague area of lucency on the frontal skull thumb. 2. No other evidence of a bone lesion. Lack of other bone lesions argues against metastatic or neoplastic disease.   Electronically Signed   By: Lajean Manes M.D.   On: 09/18/2014 21:53     ASSESSMENT / PLAN:  49 yo female former smoker with incidental finding of mediastinal/hilar/abdominal lymphadenopathy; also has Lt frontal lytic skull lesion.  Concern is for inflammatory process (such as sarcoidosis) vs malignancy (such as lymphoma). Plan: - she will need tissue sampling with bronchoscopy using endobronchial ultrasound  - procedure explained to pt >> risks detailed as bleeding, infection, pneumothorax, and non diagnosis - also discussed inherent risks associated with general anesthesia  She would not need to remain in hospital for procedure >> if she is otherwise stable for d/c home, then her bronchoscopy will be schedule as outpt.  Chesley Mires, MD [facility] Pulmonary/Critical Care 09/20/2014, 9:54 AM Pager:  [phone] After 3pm call: [phone]

## 2014-09-20 NOTE — Progress Notes (Addendum)
PROGRESS NOTE  [name] JZP:[id number] DOB: February 06, 1966 DOA: 09/18/2014 PCP: Maurice Small, D, MD  Assessment/Plan: left hemiparesis -MRI brain negative for stroke -Question conversion disorder -overall improved, back to baseline -patient was able to handle it with physical therapy without difficulty -Physical therapy did not recommend any further follow-up -Neurology was consulted during the hospitalization, they did not recommend any further workup -serum B12--353  -RBC folate--pending Atypical chest pain -Troponins negativex3 -EKG without any concerning ischemic changes Mediastinal/hilar/intra-abdominal adenopathy -09/19/2014 CT chest shows bilateral mediastinal and hilar lymphadenopathy -09/19/2014 CT abdomen and pelvis reveals upper abdominal as well as retroperitoneal lymphadenopathy -consulted pulmonary for opinion/need for bronchoscopy--spoke with Dr. Halford Chessman -check Quantiferon-GOLD and ACE level -check ANA -Bronchoscopy schedule 09/21/2014 with Dr. Lamonte Sakai Frontal skull lesion -CT brain revealed 12 x 6 mm left frontal lytic lesion -Skeletal survey shows a vague lucency without any other bony lesions -Patient has never had colonoscopy -Mammogram 2013 negative -Follow up serum protein immunofixation in the outpatient setting  -may ultimately need bone marrow biopsy, but this can be deferred to the outpatient setting AKI -volume depletion -improved with IVF Abnormal CXR -09/18/2014 chest x-ray shows Opacity along the right peritracheal stripe could reflect a mass or adenopathy -CT chest--noted above Intermitten abdominal pain -CT noted above  Family Communication:   Pt at beside Disposition Plan:   Home when medically stable      Procedures/Studies: Dg Chest 2 View  09/18/2014   CLINICAL DATA:  Headache today. Frontal skull lesion noted on current CT and MRI  EXAM: CHEST  2 VIEW  COMPARISON:  07/22/2009.  FINDINGS: There is right peritracheal soft  tissue prominence that may be vascular. Adenopathy or mass is possible.  Mediastinum otherwise normal in contour. Heart normal in size. No hilar masses.  Clear lungs.  No pleural effusion or pneumothorax.  Status post low anterior cervical spine fusion new since the prior study. Bony thorax otherwise unremarkable.  IMPRESSION: Opacity along the right peritracheal stripe could reflect a mass or adenopathy. It may, alternatively, all be vascular. Recommend followup chest CT.  Clear lungs.   Electronically Signed   By: [name] M.D.   On: 09/18/2014 21:48   Ct Head Wo Contrast  09/18/2014   CLINICAL DATA:  Code stroke, sudden onset LEFT shoulder weakness and LEFT chest pain, LEFT side weakness, history GERD, asthma, former smoker  EXAM: CT HEAD WITHOUT CONTRAST  TECHNIQUE: Contiguous axial images were obtained from the base of the skull through the vertex without intravenous contrast.  COMPARISON:  06/10/2012  FINDINGS: Normal ventricular morphology.  No midline shift or mass effect.  Normal appearance of brain parenchyma.  No intracranial hemorrhage, mass lesion or evidence acute infarction.  No extra-axial fluid collections.  Visualized paranasal sinuses and mastoid air cells clear.  New lytic lesion in LEFT frontal bone since 2013, 12 x 6 mm diameter.  No other definite focal osseous abnormalities.  IMPRESSION: No acute intracranial abnormalities.  New 12 x 6 mm lytic lesion in LEFT frontal bone since 2013, nonspecific; this could represent a metastatic focus or myeloma ; fibrous dysplasia could cause the appearance that is identified though lesion is new since 2013.  Does patient have a history of malignancy or myeloma?  Critical Value/emergent results were called by telephone at the time of interpretation on 09/18/2014 at 1406 hr to Dr. Doy Foster from the Stroke Service who verbally acknowledged these results.   Electronically Signed   By: [name]  Boles M.D.   On: 09/18/2014 14:06   Ct Chest W  Contrast  09/19/2014   CLINICAL DATA:  Thickened right paratracheal stripe on previous day's chest radiograph. Solitary lytic left frontal bone lesion on recent CT of the head and brain MRI.  EXAM: CT CHEST, ABDOMEN, AND PELVIS WITH CONTRAST  TECHNIQUE: Multidetector CT imaging of the chest, abdomen and pelvis was performed following the standard protocol during bolus administration of intravenous contrast.  CONTRAST:  100mL OMNIPAQUE IOHEXOL 300 MG/ML  SOLN  COMPARISON:  CT abdomen pelvis, 05/24/2014. Previous day's chest radiograph.  FINDINGS: CT CHEST FINDINGS  There is mediastinal and bilateral hilar adenopathy. Several reference measurements were made. Nodes pending from the AP window to the prevascular space adjacent to the aortic arch measures 16 mm in short axis. Right peritracheal node approximately 3 cm above the chronic measures 15 mm in short axis. Right subcarinal node measures 2 cm in short axis. Right inferolateral hilar node measures 14 mm in short axis. Left infrahilar node measures 17 mm in short axis.  No neck base or axillary masses or adenopathy.  Heart normal in size and configuration. No coronary artery calcifications. Great vessels are within normal limits.  Lungs are clear.  No pleural effusion.  CT ABDOMEN AND PELVIS FINDINGS  There is upper abdominal adenopathy. Node just superior to the portal vein along the gastrohepatic ligament measures 16 mm in short axis. Node anterior to the portal vein measures 13 mm short axis. Node anterior to the aorta at the level of the right renal artery measures 9 mm in short axis. Several other prominent nodes are noted along the retroperitoneum adjacent to the aorta and inferior vena cava. No enlarged pelvic lymph nodes. No enlarged mesenteric lymph nodes.  Liver, spleen, gallbladder, pancreas, adrenal glands, kidneys, ureters, bladder: Normal.  Uterus surgically absent.  No pelvic masses.  No ascites.  Colon and small bowel are unremarkable.   MUSCULOSKELETAL  No osteoblastic or osteolytic lesions. Status post posterior spinal fusion from L4 through S1. Orthopedic hardware well-seated and aligned.  IMPRESSION: 1. Mediastinal and hilar adenopathy as well of adenopathy in the upper abdomen most evident along the gastrohepatic ligament/porta hepatis. 2. No abnormality of the lungs.  No interstitial lung disease. 3. No abdominal organ abnormality. 4. No lytic bone lesions seen on the included field of view. 5. Differential diagnosis for the adenopathy includes sarcoidosis which could cause the significant mediastinal and hilar adenopathy as well as the upper abdominal adenopathy, and can cause lytic bone lesions. Lymphoma is also in the differential diagnosis.   Electronically Signed   By:   Ormond M.D.   On: 09/19/2014 20:06   Mr Brain Wo Contrast  09/18/2014   CLINICAL DATA:  48-year-old female code stroke left side weakness. Initial encounter.  EXAM: MRI HEAD WITHOUT CONTRAST  TECHNIQUE: Multiplanar, multiecho pulse sequences of the brain and surrounding structures were obtained without intravenous contrast.  COMPARISON:  Head CT without contrast 1345 hr today, and earlier. Brain MRI 10/10/2004.  FINDINGS: No restricted diffusion to suggest acute infarction. Major intracranial vascular flow voids are stable.  No midline shift, mass effect, evidence of mass lesion, ventriculomegaly, extra-axial collection or acute intracranial hemorrhage. Cervicomedullary junction and pituitary are within normal limits. Gray and white matter signal is within normal limits for age throughout the brain.  Visualized bone marrow signal is largely within normal limits, however, there is a 10-12 mm focus of abnormal marrow signal along the left frontal convexity associated with abnormal diffusion   signal (series 3, image 27 and series 5, image 14. This was also described on the earlier head CT today, and does appear to be new since 2005.  Negative visualized cervical spine  with partial visualization of anterior fusion hardware and hardware susceptibility artifact.  Visible internal auditory structures appear normal. Visualized paranasal sinuses and mastoids are clear. Visualized orbit soft tissues are within normal limits. Visualized scalp soft tissues are within normal limits.  IMPRESSION: 1. No acute infarct. Negative for age non contrast MRI appearance of the brain. 2. 10-12 mm skull lesion along the anterior frontal convexity, suspicious for bone metastasis or other hypercellular lesion and new since 2013.   Electronically Signed   By: Lars Pinks M.D.   On: 09/18/2014 15:39   Ct Abdomen Pelvis W Contrast  09/19/2014   CLINICAL DATA:  Thickened right paratracheal stripe on previous day's chest radiograph. Solitary lytic left frontal bone lesion on recent CT of the head and brain MRI.  EXAM: CT CHEST, ABDOMEN, AND PELVIS WITH CONTRAST  TECHNIQUE: Multidetector CT imaging of the chest, abdomen and pelvis was performed following the standard protocol during bolus administration of intravenous contrast.  CONTRAST:  126m OMNIPAQUE IOHEXOL 300 MG/ML  SOLN  COMPARISON:  CT abdomen pelvis, 05/24/2014. Previous day's chest radiograph.  FINDINGS: CT CHEST FINDINGS  There is mediastinal and bilateral hilar adenopathy. Several reference measurements were made. Nodes pending from the AP window to the prevascular space adjacent to the aortic arch measures 16 mm in short axis. Right peritracheal node approximately 3 cm above the chronic measures 15 mm in short axis. Right subcarinal node measures 2 cm in short axis. Right inferolateral hilar node measures 14 mm in short axis. Left infrahilar node measures 17 mm in short axis.  No neck base or axillary masses or adenopathy.  Heart normal in size and configuration. No coronary artery calcifications. Great vessels are within normal limits.  Lungs are clear.  No pleural effusion.  CT ABDOMEN AND PELVIS FINDINGS  There is upper abdominal  adenopathy. Node just superior to the portal vein along the gastrohepatic ligament measures 16 mm in short axis. Node anterior to the portal vein measures 13 mm short axis. Node anterior to the aorta at the level of the right renal artery measures 9 mm in short axis. Several other prominent nodes are noted along the retroperitoneum adjacent to the aorta and inferior vena cava. No enlarged pelvic lymph nodes. No enlarged mesenteric lymph nodes.  Liver, spleen, gallbladder, pancreas, adrenal glands, kidneys, ureters, bladder: Normal.  Uterus surgically absent.  No pelvic masses.  No ascites.  Colon and small bowel are unremarkable.  MUSCULOSKELETAL  No osteoblastic or osteolytic lesions. Status post posterior spinal fusion from L4 through S1. Orthopedic hardware well-seated and aligned.  IMPRESSION: 1. Mediastinal and hilar adenopathy as well of adenopathy in the upper abdomen most evident along the gastrohepatic ligament/porta hepatis. 2. No abnormality of the lungs.  No interstitial lung disease. 3. No abdominal organ abnormality. 4. No lytic bone lesions seen on the included field of view. 5. Differential diagnosis for the adenopathy includes sarcoidosis which could cause the significant mediastinal and hilar adenopathy as well as the upper abdominal adenopathy, and can cause lytic bone lesions. Lymphoma is also in the differential diagnosis.   Electronically Signed   By: DLajean ManesM.D.   On: 09/19/2014 20:06   Dg Bone Survey Met  09/18/2014   CLINICAL DATA:  Frontal skull lesion noted on current head CT and brain  MRI. Headache today.  EXAM: METASTATIC BONE SURVEY  COMPARISON:  None.  FINDINGS: Vague area of lucency suggested along the left frontal region on the frontal skull view. This may reflect the lesion seen on the current CT and brain MRI. Margins are ill-defined.  No other evidence of a skull lesion.  Elsewhere in the skeleton, there are no radiolucent/osteolytic lesions. There are no osteoblastic  lesions.  Status post into cervical spine fusion from C4 through C7. Orthopedic hardware is well-seated and aligned.  Status post L5-S1 posterior lumbar spine fusion with orthopedic hardware well-seated and aligned.  Bones are normally mineralized. No significant soft tissue abnormality.  IMPRESSION: 1. Lesions seen in the left frontal bone on the current CT and brain MRI is suggested by a vague area of lucency on the frontal skull thumb. 2. No other evidence of a bone lesion. Lack of other bone lesions argues against metastatic or neoplastic disease.   Electronically Signed   By:   Ormond M.D.   On: 09/18/2014 21:53         Subjective: Patient states that her left upper extremity and left lower extremity weakness have improved significantly. She is near baseline. Denies any fevers, chills, shortness breath, nausea, vomiting, diarrhea, abdominal pain.  Objective: Filed Vitals:   09/19/14 0543 09/19/14 1436 09/19/14 2212 09/20/14 0536  BP: 92/60 114/65 108/62 116/64  Pulse: 66 75 79 73  Temp: 97.9 F (36.6 C) 98.7 F (37.1 C) 98.4 F (36.9 C) 99.5 F (37.5 C)  TempSrc: Oral Oral Oral Oral  Resp: 18 16 18 18  Height:      Weight:      SpO2: 95% 95% 95% 93%    Intake/Output Summary (Last 24 hours) at 09/20/14 0817 Last data filed at 09/20/14 0603  Gross per 24 hour  Intake 5512.5 ml  Output   4251 ml  Net 1261.5 ml   Weight change:  Exam:   General:  Pt is alert, follows commands appropriately, not in acute distress  HEENT: No icterus, No thrush, [place]/AT  Cardiovascular: RRR, S1/S2, no rubs, no gallops  Respiratory: CTA bilaterally, no wheezing, no crackles, no rhonchi  Abdomen: Soft/+BS, non tender, non distended, no guarding  Extremities: No edema, No lymphangitis, No petechiae, No rashes, no synovitis  Data Reviewed: Basic Metabolic Panel:  Recent Labs Lab 09/18/14 1339 09/18/14 1353 09/19/14 0415  NA 139 139 140  K 4.2 4.1 4.2  CL 107 105 109  CO2 25   --  25  GLUCOSE 98 102* 94  BUN 20 24* 19  CREATININE 1.17* 1.20* 0.80  CALCIUM 9.1  --  8.9   Liver Function Tests:  Recent Labs Lab 09/18/14 1339 09/19/14 0415  AST 27 22  ALT 24 23  ALKPHOS 100 80  BILITOT 0.6 0.5  PROT 6.5 5.8*  ALBUMIN 4.2 3.5   No results for input(s): LIPASE, AMYLASE in the last 168 hours. No results for input(s): AMMONIA in the last 168 hours. CBC:  Recent Labs Lab 09/18/14 1339 09/18/14 1353 09/19/14 0415  WBC 6.6  --  5.1  NEUTROABS 4.1  --   --   HGB 12.7 13.9 12.1  HCT 36.5 41.0 35.8*  MCV 84.3  --  86.5  PLT 247  --  206   Cardiac Enzymes:  Recent Labs Lab 09/18/14 1633 09/19/14 09/19/14 0415  TROPONINI <0.03 <0.03 <0.03   BNP: Invalid input(s): POCBNP CBG: No results for input(s): GLUCAP in the last 168 hours.  No results   found for this or any previous visit (from the past 240 hour(s)).   Scheduled Meds: . diazepam  2 mg Oral QHS  . dicyclomine  20 mg Oral 4 times per day  . enoxaparin (LOVENOX) injection  40 mg Subcutaneous Q24H  . levothyroxine  137 mcg Oral q morning - 10a  . pantoprazole  40 mg Oral Daily  . PARoxetine  20 mg Oral QHS  . rOPINIRole  2 mg Oral QHS  . sodium chloride  3 mL Intravenous Q12H  . traZODone  50 mg Oral QHS  . valACYclovir  500 mg Oral QHS   Continuous Infusions: . sodium chloride 75 mL/hr at 09/19/14 0751     , , DO  [facility] Pager [phone]  If 7PM-7AM, please contact night-coverage [email or web address] Password [email or web address] 09/20/2014, 8:17 AM   LOS: 2 days

## 2014-09-20 NOTE — Discharge Summary (Signed)
Physician Discharge Summary  [name] RXY:[id number] DOB: 07-Nov-1965 DOA: 09/18/2014  PCP: Shirlean Mylar, D, MD  Admit date: 09/18/2014 Discharge date: 09/21/14 Recommendations for Outpatient Follow-up:  1. Pt will need to follow up with PCP in 2 weeks post discharge Please obtain BMP and CBC in 1-2 weeks Discharge Diagnoses:  Left hemiparesis -MRI brain negative for stroke -Question conversion disorder -overall improved, back to baseline -patient was able tolerate physical therapy without difficulty -Physical therapy did not recommend any further follow-up -Neurology was consulted during the hospitalization, they did not recommend any further workup -serum B12--353  -RBC folate--837 Atypical chest pain -Troponins negativex3 -EKG without any concerning ischemic changes Mediastinal/hilar/intra-abdominal adenopathy -09/19/2014 CT chest shows bilateral mediastinal and hilar lymphadenopathy -09/19/2014 CT abdomen and pelvis reveals upper abdominal as well as retroperitoneal lymphadenopathy -consulted pulmonary for opinion/need for bronchoscopy--spoke with Dr. Craige Cotta -ACE level elevated at 76 -check Quantiferon-GOLD--please follow up results in outpatient setting -check ANA--please follow up results in the outpatient setting -Pulmonology recommended bronchoscopy which was performed on 09/21/2014 -pt will follow up with pulmonology as outpt for pathology results and culture data -The patient will follow-up in the outpatient setting for pathology and culture results.  Pt did not want to remain inpatient for results -If Bx from bronchoscopy is neg, pt may need medistinoscopy -HIV antibody negative Frontal lytic skull lesion -CT brain revealed 12 x 6 mm left frontal lytic lesion -Skeletal survey shows a vague lucency without any other bony lesions -Patient has never had colonoscopy -Mammogram 2013 negative -SPEP neg -Follow up serum protein immunofixation in the outpatient  setting--preliminary results neg  -may ultimately need bone marrow biopsy, but this can be deferred to the outpatient setting AKI -volume depletion -improved with IVF Abnormal CXR -09/18/2014 chest x-ray shows Opacity along the right peritracheal stripe could reflect a mass or adenopathy -CT chest--noted above Intermitten abdominal pain -CT noted above  Discharge Condition: stable  Disposition:  Follow-up Information    Follow up with SOOD,VINEET, MD In 2 weeks.   Specialty:  Pulmonary Disease   Contact information:   [address] [place] [address] [phone]     home  Diet:heart healthy Wt Readings from Last 3 Encounters:  09/18/14 75.297 kg (166 lb)  11/30/12 84.142 kg (185 lb 8 oz)  11/16/12 84.369 kg (186 lb)    History of present illness:  49 y.o. female has a past medical history significant for Hypothyroidism, IBS, Depression, RLS, prior conversion episodes, migraine HA presents with sudden onset left shoulder pain, left neck pain followed by left sided weakness / numbness. 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 She was brought emergently in the ED and a code stroke was called. Neurology was consulted and evaluated patient. CT scan of the head was negative for intracranial abnormalities, this was followed by an MRI which was negative for CVA. She denies any recent chest pain or breathing difficulties, endorses mild chronic abdominal pain which hasn't changed in the last few days, she had mild nausea today but denies any vomiting. She denies any lightheadedness or dizziness, however endorses a headache. She has had a recent mild URI which is improving. After neurology evaluation, they did not recommend any further testing, however patient was still complaining of left-sided weakness and unable to move her left leg, unable to ambulate, thus TRH was asked for admission. In the  emergency room, her blood work is remarkable  for mild AK AI with a creatinine of 1.2 and a BUN of 24, her CBC is normal. Both the CT and the MRI show 10-12 mm lytic bone lesion. She has a 20 year smoking history, quit mid 2015.  During the admission, the patient's sensory disturbance and left sided weakness completely resolved. Workup for the patient's lytic lesion on her skull revealed bilateral hilar and mediastinal lymphadenopathy as well as intra-abdominal adenopathy. Pulmonology was consulted. Bronchoscopy was performed with biopsies and cultures. On the day of discharge, the patient was clinically stable. She did not want to remain in the hospital to wait for the results of her cultures and pathology. The patient was discharged in stable condition with instructions to  follow-up with her pulmonary doctor for results.  The patient was instructed to come back to the emergency department should she began experiencing any chest discomfort, shortness of breath, high fevers.     Discharge Exam: Filed Vitals:   09/21/14 1307  BP: 119/77  Pulse: 97  Temp: 100.5 F (38.1 C)  Resp: 16   Filed Vitals:   09/21/14 1200 09/21/14 1215 09/21/14 1230 09/21/14 1307  BP: 121/77 123/66 110/61 119/77  Pulse: 87 94 83 97  Temp:    100.5 F (38.1 C)  TempSrc:    Oral  Resp: $Remo'17 16 12 16  'rwnsD$ Height:      Weight:      SpO2: 96% 96% 97% 94%   General: A&O x 3, NAD, pleasant, cooperative Cardiovascular: RRR, no rub, no gallop, no S3 Respiratory: CTAB, no wheeze, no rhonchi Abdomen:soft, nontender, nondistended, positive bowel sounds Extremities: No edema, No lymphangitis, no petechiae  Discharge Instructions      Discharge Instructions    Diet - low sodium heart healthy    Complete by:  As directed      Diet - low sodium heart healthy    Complete by:  As directed      Increase activity slowly    Complete by:  As directed      Increase activity slowly    Complete by:  As directed               Medication List    TAKE these medications        albuterol 108 (90 BASE) MCG/ACT inhaler  Commonly known as:  PROVENTIL HFA;VENTOLIN HFA  Inhale 2 puffs into the lungs every 6 (six) hours as needed. For shortness of breath     diazepam 2 MG tablet  Commonly known as:  VALIUM  Take 2 mg by mouth at bedtime.     dicyclomine 20 MG tablet  Commonly known as:  BENTYL  Take 20 mg by mouth every 6 (six) hours.     levothyroxine 137 MCG tablet  Commonly known as:  SYNTHROID, LEVOTHROID  Take 137 mcg by mouth every morning.     meloxicam 7.5 MG tablet  Commonly known as:  MOBIC  Take 7.5 mg by mouth 2 (two) times daily as needed.     omeprazole 40 MG capsule  Commonly known as:  PRILOSEC  Take 40 mg by mouth at bedtime.     ondansetron 4 MG disintegrating tablet  Commonly known as:  ZOFRAN ODT  $Re'4mg'uYk$  ODT q4 hours prn nausea/vomit     PARoxetine 20 MG tablet  Commonly known as:  PAXIL  Take 20 mg by mouth at bedtime.     rOPINIRole 2 MG tablet  Commonly known as:  REQUIP  Take 2  mg by mouth at bedtime.     traZODone 50 MG tablet  Commonly known as:  DESYREL  Take 50 mg by mouth at bedtime.     valACYclovir 500 MG tablet  Commonly known as:  VALTREX  Take 500 mg by mouth at bedtime.         The results of significant diagnostics from this hospitalization (including imaging, microbiology, ancillary and laboratory) are listed below for reference.    Significant Diagnostic Studies: Dg Chest 2 View  09/18/2014   CLINICAL DATA:  Headache today. Frontal skull lesion noted on current CT and MRI  EXAM: CHEST  2 VIEW  COMPARISON:  07/22/2009.  FINDINGS: There is right peritracheal soft tissue prominence that may be vascular. Adenopathy or mass is possible.  Mediastinum otherwise normal in contour. Heart normal in size. No hilar masses.  Clear lungs.  No pleural effusion or pneumothorax.  Status post low anterior cervical spine fusion new since the prior study. Bony thorax  otherwise unremarkable.  IMPRESSION: Opacity along the right peritracheal stripe could reflect a mass or adenopathy. It may, alternatively, all be vascular. Recommend followup chest CT.  Clear lungs.   Electronically Signed   By: Lajean Manes M.D.   On: 09/18/2014 21:48   Ct Head Wo Contrast  09/18/2014   CLINICAL DATA:  Code stroke, sudden onset LEFT shoulder weakness and LEFT chest pain, LEFT side weakness, history GERD, asthma, former smoker  EXAM: CT HEAD WITHOUT CONTRAST  TECHNIQUE: Contiguous axial images were obtained from the base of the skull through the vertex without intravenous contrast.  COMPARISON:  06/10/2012  FINDINGS: Normal ventricular morphology.  No midline shift or mass effect.  Normal appearance of brain parenchyma.  No intracranial hemorrhage, mass lesion or evidence acute infarction.  No extra-axial fluid collections.  Visualized paranasal sinuses and mastoid air cells clear.  New lytic lesion in LEFT frontal bone since 2013, 12 x 6 mm diameter.  No other definite focal osseous abnormalities.  IMPRESSION: No acute intracranial abnormalities.  New 12 x 6 mm lytic lesion in LEFT frontal bone since 2013, nonspecific; this could represent a metastatic focus or myeloma ; fibrous dysplasia could cause the appearance that is identified though lesion is new since 2013.  Does patient have a history of malignancy or myeloma?  Critical Value/emergent results were called by telephone at the time of interpretation on 09/18/2014 at 1406 hr to Dr. Doy Mince from the Stroke Service who verbally acknowledged these results.   Electronically Signed   By: Lavonia Dana M.D.   On: 09/18/2014 14:06   Ct Chest W Contrast  09/19/2014   CLINICAL DATA:  Thickened right paratracheal stripe on previous day's chest radiograph. Solitary lytic left frontal bone lesion on recent CT of the head and brain MRI.  EXAM: CT CHEST, ABDOMEN, AND PELVIS WITH CONTRAST  TECHNIQUE: Multidetector CT imaging of the chest, abdomen and  pelvis was performed following the standard protocol during bolus administration of intravenous contrast.  CONTRAST:  164mL OMNIPAQUE IOHEXOL 300 MG/ML  SOLN  COMPARISON:  CT abdomen pelvis, 05/24/2014. Previous day's chest radiograph.  FINDINGS: CT CHEST FINDINGS  There is mediastinal and bilateral hilar adenopathy. Several reference measurements were made. Nodes pending from the AP window to the prevascular space adjacent to the aortic arch measures 16 mm in short axis. Right peritracheal node approximately 3 cm above the chronic measures 15 mm in short axis. Right subcarinal node measures 2 cm in short axis. Right inferolateral hilar node measures  14 mm in short axis. Left infrahilar node measures 17 mm in short axis.  No neck base or axillary masses or adenopathy.  Heart normal in size and configuration. No coronary artery calcifications. Great vessels are within normal limits.  Lungs are clear.  No pleural effusion.  CT ABDOMEN AND PELVIS FINDINGS  There is upper abdominal adenopathy. Node just superior to the portal vein along the gastrohepatic ligament measures 16 mm in short axis. Node anterior to the portal vein measures 13 mm short axis. Node anterior to the aorta at the level of the right renal artery measures 9 mm in short axis. Several other prominent nodes are noted along the retroperitoneum adjacent to the aorta and inferior vena cava. No enlarged pelvic lymph nodes. No enlarged mesenteric lymph nodes.  Liver, spleen, gallbladder, pancreas, adrenal glands, kidneys, ureters, bladder: Normal.  Uterus surgically absent.  No pelvic masses.  No ascites.  Colon and small bowel are unremarkable.  MUSCULOSKELETAL  No osteoblastic or osteolytic lesions. Status post posterior spinal fusion from L4 through S1. Orthopedic hardware well-seated and aligned.  IMPRESSION: 1. Mediastinal and hilar adenopathy as well of adenopathy in the upper abdomen most evident along the gastrohepatic ligament/porta hepatis. 2. No  abnormality of the lungs.  No interstitial lung disease. 3. No abdominal organ abnormality. 4. No lytic bone lesions seen on the included field of view. 5. Differential diagnosis for the adenopathy includes sarcoidosis which could cause the significant mediastinal and hilar adenopathy as well as the upper abdominal adenopathy, and can cause lytic bone lesions. Lymphoma is also in the differential diagnosis.   Electronically Signed   By: Lajean Manes M.D.   On: 09/19/2014 20:06   Mr Brain Wo Contrast  09/18/2014   CLINICAL DATA:  49 year old female code stroke left side weakness. Initial encounter.  EXAM: MRI HEAD WITHOUT CONTRAST  TECHNIQUE: Multiplanar, multiecho pulse sequences of the brain and surrounding structures were obtained without intravenous contrast.  COMPARISON:  Head CT without contrast 1345 hr today, and earlier. Brain MRI 10/10/2004.  FINDINGS: No restricted diffusion to suggest acute infarction. Major intracranial vascular flow voids are stable.  No midline shift, mass effect, evidence of mass lesion, ventriculomegaly, extra-axial collection or acute intracranial hemorrhage. Cervicomedullary junction and pituitary are within normal limits. Pearline Cables and white matter signal is within normal limits for age throughout the brain.  Visualized bone marrow signal is largely within normal limits, however, there is a 10-12 mm focus of abnormal marrow signal along the left frontal convexity associated with abnormal diffusion signal (series 3, image 27 and series 5, image 14. This was also described on the earlier head CT today, and does appear to be new since 2005.  Negative visualized cervical spine with partial visualization of anterior fusion hardware and hardware susceptibility artifact.  Visible internal auditory structures appear normal. Visualized paranasal sinuses and mastoids are clear. Visualized orbit soft tissues are within normal limits. Visualized scalp soft tissues are within normal limits.   IMPRESSION: 1. No acute infarct. Negative for age non contrast MRI appearance of the brain. 2. 10-12 mm skull lesion along the anterior frontal convexity, suspicious for bone metastasis or other hypercellular lesion and new since 2013.   Electronically Signed   By: Lars Pinks M.D.   On: 09/18/2014 15:39   Ct Abdomen Pelvis W Contrast  09/19/2014   CLINICAL DATA:  Thickened right paratracheal stripe on previous day's chest radiograph. Solitary lytic left frontal bone lesion on recent CT of the head and brain  MRI.  EXAM: CT CHEST, ABDOMEN, AND PELVIS WITH CONTRAST  TECHNIQUE: Multidetector CT imaging of the chest, abdomen and pelvis was performed following the standard protocol during bolus administration of intravenous contrast.  CONTRAST:  145mL OMNIPAQUE IOHEXOL 300 MG/ML  SOLN  COMPARISON:  CT abdomen pelvis, 05/24/2014. Previous day's chest radiograph.  FINDINGS: CT CHEST FINDINGS  There is mediastinal and bilateral hilar adenopathy. Several reference measurements were made. Nodes pending from the AP window to the prevascular space adjacent to the aortic arch measures 16 mm in short axis. Right peritracheal node approximately 3 cm above the chronic measures 15 mm in short axis. Right subcarinal node measures 2 cm in short axis. Right inferolateral hilar node measures 14 mm in short axis. Left infrahilar node measures 17 mm in short axis.  No neck base or axillary masses or adenopathy.  Heart normal in size and configuration. No coronary artery calcifications. Great vessels are within normal limits.  Lungs are clear.  No pleural effusion.  CT ABDOMEN AND PELVIS FINDINGS  There is upper abdominal adenopathy. Node just superior to the portal vein along the gastrohepatic ligament measures 16 mm in short axis. Node anterior to the portal vein measures 13 mm short axis. Node anterior to the aorta at the level of the right renal artery measures 9 mm in short axis. Several other prominent nodes are noted along the  retroperitoneum adjacent to the aorta and inferior vena cava. No enlarged pelvic lymph nodes. No enlarged mesenteric lymph nodes.  Liver, spleen, gallbladder, pancreas, adrenal glands, kidneys, ureters, bladder: Normal.  Uterus surgically absent.  No pelvic masses.  No ascites.  Colon and small bowel are unremarkable.  MUSCULOSKELETAL  No osteoblastic or osteolytic lesions. Status post posterior spinal fusion from L4 through S1. Orthopedic hardware well-seated and aligned.  IMPRESSION: 1. Mediastinal and hilar adenopathy as well of adenopathy in the upper abdomen most evident along the gastrohepatic ligament/porta hepatis. 2. No abnormality of the lungs.  No interstitial lung disease. 3. No abdominal organ abnormality. 4. No lytic bone lesions seen on the included field of view. 5. Differential diagnosis for the adenopathy includes sarcoidosis which could cause the significant mediastinal and hilar adenopathy as well as the upper abdominal adenopathy, and can cause lytic bone lesions. Lymphoma is also in the differential diagnosis.   Electronically Signed   By: Lajean Manes M.D.   On: 09/19/2014 20:06   Dg Bone Survey Met  09/18/2014   CLINICAL DATA:  Frontal skull lesion noted on current head CT and brain MRI. Headache today.  EXAM: METASTATIC BONE SURVEY  COMPARISON:  None.  FINDINGS: Vague area of lucency suggested along the left frontal region on the frontal skull view. This may reflect the lesion seen on the current CT and brain MRI. Margins are ill-defined.  No other evidence of a skull lesion.  Elsewhere in the skeleton, there are no radiolucent/osteolytic lesions. There are no osteoblastic lesions.  Status post into cervical spine fusion from C4 through C7. Orthopedic hardware is well-seated and aligned.  Status post L5-S1 posterior lumbar spine fusion with orthopedic hardware well-seated and aligned.  Bones are normally mineralized. No significant soft tissue abnormality.  IMPRESSION: 1. Lesions seen in  the left frontal bone on the current CT and brain MRI is suggested by a vague area of lucency on the frontal skull thumb. 2. No other evidence of a bone lesion. Lack of other bone lesions argues against metastatic or neoplastic disease.   Electronically Signed   By:  Lajean Manes M.D.   On: 09/18/2014 21:53     Microbiology: Recent Results (from the past 240 hour(s))  Surgical pcr screen     Status: None   Collection Time: 09/21/14  8:12 AM  Result Value Ref Range Status   MRSA, PCR NEGATIVE NEGATIVE Final   Staphylococcus aureus NEGATIVE NEGATIVE Final    Comment:        The Xpert SA Assay (FDA approved for NASAL specimens in patients over 50 years of age), is one component of a comprehensive surveillance program.  Test performance has been validated by [facility] for patients greater than or equal to 49 year old. It is not intended to diagnose infection nor to guide or monitor treatment.      Labs: Basic Metabolic Panel:  Recent Labs Lab 09/18/14 1339 09/18/14 1353 09/19/14 0415 09/20/14 0730  NA 139 139 140 139  K 4.2 4.1 4.2 4.4  CL 107 105 109 106  CO2 25  --  25 26  GLUCOSE 98 102* 94 109*  BUN 20 24* 19 12  CREATININE 1.17* 1.20* 0.80 0.83  CALCIUM 9.1  --  8.9 8.9   Liver Function Tests:  Recent Labs Lab 09/18/14 1339 09/19/14 0415  AST 27 22  ALT 24 23  ALKPHOS 100 80  BILITOT 0.6 0.5  PROT 6.5 5.8*  ALBUMIN 4.2 3.5   No results for input(s): LIPASE, AMYLASE in the last 168 hours. No results for input(s): AMMONIA in the last 168 hours. CBC:  Recent Labs Lab 09/18/14 1339 09/18/14 1353 09/19/14 0415 09/19/14 1455  WBC 6.6  --  5.1  --   NEUTROABS 4.1  --   --   --   HGB 12.7 13.9 12.1  --   HCT 36.5 41.0 35.8* 36.0  MCV 84.3  --  86.5  --   PLT 247  --  206  --    Cardiac Enzymes:  Recent Labs Lab 09/18/14 1633 09/19/14 09/19/14 0415  TROPONINI <0.03 <0.03 <0.03   BNP: Invalid input(s): POCBNP CBG: No results for input(s):  GLUCAP in the last 168 hours.  Time coordinating discharge:  Greater than 30 minutes  Signed:  , , DO [facility] Pager: [phone] 09/21/2014, 1:26 PM

## 2014-09-21 ENCOUNTER — Inpatient Hospital Stay (HOSPITAL_COMMUNITY): Payer: BLUE CROSS/BLUE SHIELD | Admitting: Anesthesiology

## 2014-09-21 ENCOUNTER — Encounter (HOSPITAL_COMMUNITY)
Admission: EM | Disposition: A | Discharge status: Home / Self Care | Payer: Self-pay | Source: Home / Self Care | Attending: Internal Medicine

## 2014-09-21 DIAGNOSIS — R599 Enlarged lymph nodes, unspecified: Secondary | ICD-10-CM

## 2014-09-21 DIAGNOSIS — M6289 Other specified disorders of muscle: Secondary | ICD-10-CM

## 2014-09-21 DIAGNOSIS — R531 Weakness: Secondary | ICD-10-CM | POA: Insufficient documentation

## 2014-09-21 DIAGNOSIS — R590 Localized enlarged lymph nodes: Secondary | ICD-10-CM | POA: Diagnosis present

## 2014-09-21 HISTORY — PX: VIDEO BRONCHOSCOPY WITH ENDOBRONCHIAL ULTRASOUND: SHX6177

## 2014-09-21 LAB — ANGIOTENSIN CONVERTING ENZYME: Angiotensin-Converting Enzyme: 76 U/L — ABNORMAL HIGH (ref 8–52)

## 2014-09-21 LAB — IMMUNOFIXATION ELECTROPHORESIS
IGG (IMMUNOGLOBIN G), SERUM: 769 mg/dL (ref 690–1700)
IgA: 73 mg/dL (ref 69–380)
IgM, Serum: 42 mg/dL — ABNORMAL LOW (ref 52–322)
TOTAL PROTEIN ELP: 6.4 g/dL (ref 6.0–8.3)

## 2014-09-21 LAB — SURGICAL PCR SCREEN
MRSA, PCR: NEGATIVE
Staphylococcus aureus: NEGATIVE

## 2014-09-21 LAB — HIV ANTIBODY (ROUTINE TESTING W REFLEX)
HIV 1/O/2 Abs-Index Value: <1 (ref <1.00)
HIV-1/HIV-2 Ab: NONREACTIVE

## 2014-09-21 LAB — ANA: Anti Nuclear Antibody(ANA): NEGATIVE

## 2014-09-21 SURGERY — BRONCHOSCOPY, WITH EBUS
Anesthesia: General | Site: Chest

## 2014-09-21 MED ORDER — MIDAZOLAM HCL 5 MG/5ML IJ SOLN
INTRAMUSCULAR | Status: DC | PRN
  Administered 2014-09-21: 2 mg via INTRAVENOUS

## 2014-09-21 MED ORDER — GLYCOPYRROLATE 0.2 MG/ML IJ SOLN
INTRAMUSCULAR | Status: DC | PRN
  Administered 2014-09-21: .4 mg via INTRAVENOUS

## 2014-09-21 MED ORDER — PROPOFOL 10 MG/ML IV BOLUS
INTRAVENOUS | Status: DC | PRN
  Administered 2014-09-21: 150 mg via INTRAVENOUS

## 2014-09-21 MED ORDER — 0.9 % SODIUM CHLORIDE (POUR BTL) OPTIME
TOPICAL | Status: DC | PRN
  Administered 2014-09-21: 1000 mL

## 2014-09-21 MED ORDER — FENTANYL CITRATE 0.05 MG/ML IJ SOLN
INTRAMUSCULAR | Status: AC
  Filled 2014-09-21: qty 5

## 2014-09-21 MED ORDER — LACTATED RINGERS IV SOLN
INTRAVENOUS | Status: DC
  Administered 2014-09-21: 09:00:00 via INTRAVENOUS

## 2014-09-21 MED ORDER — ROCURONIUM BROMIDE 100 MG/10ML IV SOLN
INTRAVENOUS | Status: DC | PRN
  Administered 2014-09-21: 40 mg via INTRAVENOUS

## 2014-09-21 MED ORDER — ARTIFICIAL TEARS OP OINT
TOPICAL_OINTMENT | OPHTHALMIC | Status: DC | PRN
  Administered 2014-09-21: 1 via OPHTHALMIC

## 2014-09-21 MED ORDER — LIDOCAINE HCL (CARDIAC) 20 MG/ML IV SOLN
INTRAVENOUS | Status: DC | PRN
  Administered 2014-09-21: 50 mg via INTRAVENOUS

## 2014-09-21 MED ORDER — PROPOFOL 10 MG/ML IV BOLUS
INTRAVENOUS | Status: AC
  Filled 2014-09-21: qty 20

## 2014-09-21 MED ORDER — ONDANSETRON HCL 4 MG/2ML IJ SOLN
INTRAMUSCULAR | Status: DC | PRN
  Administered 2014-09-21: 4 mg via INTRAVENOUS

## 2014-09-21 MED ORDER — LACTATED RINGERS IV SOLN
INTRAVENOUS | Status: DC | PRN
  Administered 2014-09-21 (×2): via INTRAVENOUS

## 2014-09-21 MED ORDER — NEOSTIGMINE METHYLSULFATE 10 MG/10ML IV SOLN
INTRAVENOUS | Status: DC | PRN
  Administered 2014-09-21: 3 mg via INTRAVENOUS

## 2014-09-21 MED ORDER — FENTANYL CITRATE 0.05 MG/ML IJ SOLN: 25.0000 ug | INTRAMUSCULAR | Status: DC | PRN

## 2014-09-21 MED ORDER — PHENYLEPHRINE HCL 10 MG/ML IJ SOLN
INTRAMUSCULAR | Status: DC | PRN
  Administered 2014-09-21: 80 ug via INTRAVENOUS

## 2014-09-21 MED ORDER — EPINEPHRINE HCL 1 MG/ML IJ SOLN
INTRAMUSCULAR | Status: AC
  Filled 2014-09-21: qty 1

## 2014-09-21 MED ORDER — DEXAMETHASONE SODIUM PHOSPHATE 10 MG/ML IJ SOLN
INTRAMUSCULAR | Status: DC | PRN
  Administered 2014-09-21: 10 mg via INTRAVENOUS

## 2014-09-21 MED ORDER — DEXAMETHASONE SODIUM PHOSPHATE 10 MG/ML IJ SOLN
INTRAMUSCULAR | Status: AC
  Filled 2014-09-21: qty 1

## 2014-09-21 MED ORDER — PROMETHAZINE HCL 25 MG/ML IJ SOLN: 6.2500 mg | INTRAMUSCULAR | Status: DC | PRN

## 2014-09-21 MED ORDER — MIDAZOLAM HCL 2 MG/2ML IJ SOLN
INTRAMUSCULAR | Status: AC
  Filled 2014-09-21: qty 2

## 2014-09-21 MED ORDER — FENTANYL CITRATE 0.05 MG/ML IJ SOLN
INTRAMUSCULAR | Status: DC | PRN
  Administered 2014-09-21 (×2): 100 ug via INTRAVENOUS
  Administered 2014-09-21: 50 ug via INTRAVENOUS

## 2014-09-21 MED ORDER — LIDOCAINE HCL 4 % MT SOLN
OROMUCOSAL | Status: DC | PRN
Start: 2014-09-21 — End: 2014-09-21
  Administered 2014-09-21: 4 mL via TOPICAL

## 2014-09-21 SURGICAL SUPPLY — 24 items
BRUSH CYTOL CELLEBRITY 1.5X140 (MISCELLANEOUS) IMPLANT
CANISTER SUCTION 2500CC (MISCELLANEOUS) ×2 IMPLANT
CONT SPEC 4OZ CLIKSEAL STRL BL (MISCELLANEOUS) ×2 IMPLANT
COVER TABLE BACK 60X90 (DRAPES) ×2 IMPLANT
FORCEPS BIOP RJ4 1.8 (CUTTING FORCEPS) IMPLANT
GAUZE SPONGE 4X4 12PLY STRL (GAUZE/BANDAGES/DRESSINGS) IMPLANT
GLOVE BIOGEL M STRL SZ7.5 (GLOVE) ×2 IMPLANT
GOWN STRL REUS W/ TWL LRG LVL3 (GOWN DISPOSABLE) ×1 IMPLANT
GOWN STRL REUS W/TWL LRG LVL3 (GOWN DISPOSABLE) ×1
KIT CLEAN ENDO COMPLIANCE (KITS) ×2 IMPLANT
KIT ROOM TURNOVER OR (KITS) ×2 IMPLANT
MARKER SKIN DUAL TIP RULER LAB (MISCELLANEOUS) ×2 IMPLANT
NEEDLE BIOPSY TRANSBRONCH 21G (NEEDLE) IMPLANT
NEEDLE SONO TIP II EBUS (NEEDLE) ×4 IMPLANT
NEEDLE SYS SONOTIP II EBUSTBNA (NEEDLE) IMPLANT
NS IRRIG 1000ML POUR BTL (IV SOLUTION) ×2 IMPLANT
OIL SILICONE PENTAX (PARTS (SERVICE/REPAIRS)) ×2 IMPLANT
PAD ARMBOARD 7.5X6 YLW CONV (MISCELLANEOUS) ×4 IMPLANT
SYR 20CC LL (SYRINGE) ×2 IMPLANT
SYR 20ML ECCENTRIC (SYRINGE) ×4 IMPLANT
SYR 5ML LUER SLIP (SYRINGE) ×2 IMPLANT
TOWEL OR 17X24 6PK STRL BLUE (TOWEL DISPOSABLE) ×2 IMPLANT
TRAP SPECIMEN MUCOUS 40CC (MISCELLANEOUS) ×2 IMPLANT
TUBE CONNECTING 20X1/4 (TUBING) ×4 IMPLANT

## 2014-09-21 NOTE — Anesthesia Preprocedure Evaluation (Addendum)
Anesthesia Evaluation  Patient identified by MRN, date of birth, ID band Patient awake  General Assessment Comment:Frontal lytic skull lesion  Reviewed: Allergy & Precautions, NPO status , Patient's Chart, lab work & pertinent test results  Airway Mallampati: II  TM Distance: >3 FB Neck ROM: Full    Dental no notable dental hx.    Pulmonary asthma , former smoker,  breath sounds clear to auscultation  Pulmonary exam normal       Cardiovascular negative cardio ROS  Rhythm:Regular Rate:Normal     Neuro/Psych Seizures -,  Anxiety    GI/Hepatic Neg liver ROS, GERD-  ,  Endo/Other  Hypothyroidism   Renal/GU negative Renal ROS  negative genitourinary   Musculoskeletal negative musculoskeletal ROS (+)   Abdominal   Peds negative pediatric ROS (+)  Hematology negative hematology ROS (+)   Anesthesia Other Findings   Reproductive/Obstetrics negative OB ROS                           Anesthesia Physical Anesthesia Plan  ASA: III  Anesthesia Plan: General   Post-op Pain Management:    Induction: Intravenous  Airway Management Planned: Oral ETT  Additional Equipment:   Intra-op Plan:   Post-operative Plan: Extubation in OR  Informed Consent: I have reviewed the patients History and Physical, chart, labs and discussed the procedure including the risks, benefits and alternatives for the proposed anesthesia with the patient or authorized representative who has indicated his/her understanding and acceptance.   Dental advisory given  Plan Discussed with: CRNA and Surgeon  Anesthesia Plan Comments: (Investigating sarcoidosis vs lymphoma vs primary lung ca)       Anesthesia Quick Evaluation

## 2014-09-21 NOTE — Interval H&P Note (Signed)
PCCM Interval note   Pt with asymptomatic mediastinal LAD presents now for Wang needle biopsies No new issues reported since 1/21 evaluation.  She would like for [place] to speak with Dr Dwyane Dee regarding her status  Filed Vitals:   09/20/14 0536 09/20/14 1448 09/20/14 2100 09/21/14 0602  BP: 116/64 109/62 117/64 112/68  Pulse: 73 92 92 79  Temp: 99.5 F (37.5 C) 98.9 F (37.2 C) 98.6 F (37 C) 98.9 F (37.2 C)  TempSrc: Oral Oral Oral Oral  Resp: 18 20 20 15   Height:      Weight:      SpO2: 93% 96% 97% 96%    Recent Labs Lab 09/18/14 1339 09/18/14 1353 09/19/14 0415 09/19/14 1455  HGB 12.7 13.9 12.1  --   HCT 36.5 41.0 35.8* 36.0  WBC 6.6  --  5.1  --   PLT 247  --  206  --     Recent Labs Lab 09/18/14 1339  INR 0.96    Recent Labs Lab 09/18/14 1339 09/18/14 1353 09/19/14 0415 09/20/14 0730  NA 139 139 140 139  K 4.2 4.1 4.2 4.4  CL 107 105 109 106  CO2 25  --  25 26  GLUCOSE 98 102* 94 109*  BUN 20 24* 19 12  CREATININE 1.17* 1.20* 0.80 0.83  CALCIUM 9.1  --  8.9 8.9   Lungs  > clear M2 airway No edema  Plans:  Will proceed with nodal bx's using EBUS and Wang needle  Baltazar Apo, MD, PhD 09/21/2014, 9:02 AM [facility] [phone] or if no answer [phone]

## 2014-09-21 NOTE — Discharge Summary (Signed)
Roxanna Mew to be D/C'd Home per MD order.  Discussed with the patient and all questions fully answered.    Medication List    TAKE these medications        albuterol 108 (90 BASE) MCG/ACT inhaler  Commonly known as:  PROVENTIL HFA;VENTOLIN HFA  Inhale 2 puffs into the lungs every 6 (six) hours as needed. For shortness of breath     diazepam 2 MG tablet  Commonly known as:  VALIUM  Take 2 mg by mouth at bedtime.     dicyclomine 20 MG tablet  Commonly known as:  BENTYL  Take 20 mg by mouth every 6 (six) hours.     levothyroxine 137 MCG tablet  Commonly known as:  SYNTHROID, LEVOTHROID  Take 137 mcg by mouth every morning.     meloxicam 7.5 MG tablet  Commonly known as:  MOBIC  Take 7.5 mg by mouth 2 (two) times daily as needed.     omeprazole 40 MG capsule  Commonly known as:  PRILOSEC  Take 40 mg by mouth at bedtime.     ondansetron 4 MG disintegrating tablet  Commonly known as:  ZOFRAN ODT  4mg  ODT q4 hours prn nausea/vomit     PARoxetine 20 MG tablet  Commonly known as:  PAXIL  Take 20 mg by mouth at bedtime.     rOPINIRole 2 MG tablet  Commonly known as:  REQUIP  Take 2 mg by mouth at bedtime.     traZODone 50 MG tablet  Commonly known as:  DESYREL  Take 50 mg by mouth at bedtime.     valACYclovir 500 MG tablet  Commonly known as:  VALTREX  Take 500 mg by mouth at bedtime.        VVS, Skin clean, dry and intact without evidence of skin break down, no evidence of skin tears noted. IV catheter discontinued intact. Site without signs and symptoms of complications. Dressing and pressure applied.  An After Visit Summary was printed and given to the patient.  D/c education completed with patient/family including follow up instructions, medication list, d/c activities limitations if indicated, with other d/c instructions as indicated by MD - patient able to verbalize understanding, all questions fully answered.   Patient instructed to return to ED, call  911, or call MD for any changes in condition.   Patient escorted via [place], and D/C home via private auto.  Audria Nine F 09/21/2014 2:16 PM

## 2014-09-21 NOTE — Op Note (Signed)
Video Bronchoscopy with Endobronchial Ultrasound Procedure Note  Date of Operation: 09/21/2014  Pre-op Diagnosis: Mediastinal LAD  Post-op Diagnosis: same  Surgeon: Baltazar Apo  Assistants: none  Anesthesia: General endotracheal anesthesia  Operation: Flexible video fiberoptic bronchoscopy with endobronchial ultrasound and biopsies.  Estimated Blood Loss: Minimal  Complications: none apparent  Indications and History: [name] is a 49 y.o. female with mediastinal enlargement noted on CXR from 09/18/14. A follow up CT scan revealed mediastinal lymphadenopathy. Recommendation was made to sample the nodes via EBUS and needle biopsies.  The risks, benefits, complications, treatment options and expected outcomes were discussed with the patient.  The possibilities of pneumothorax, pneumonia, reaction to medication, pulmonary aspiration, perforation of a viscus, bleeding, failure to diagnose a condition and creating a complication requiring transfusion or operation were discussed with the patient who freely signed the consent.    Description of Procedure: The patient was examined in the preoperative area and history and data from the preprocedure consultation were reviewed. It was deemed appropriate to proceed.  The patient was taken to OR10, identified as [name] and the procedure verified as Flexible Video Fiberoptic Bronchoscopy.  A Time Out was held and the above information confirmed. After being taken to the operating room general anesthesia was initiated and the patient  was orally intubated. The video fiberoptic bronchoscope was introduced via the endotracheal tube and a general inspection was performed which showed normal airways throughout. The standard scope was then withdrawn and the endobronchial ultrasound was used to identify and characterize the peritracheal, hilar and bronchial lymph nodes. Inspection showed enlargement of almost all nodes inspected with the  exception of the 4L node in the AP window. Using real-time ultrasound guidance Wang needle biopsies were take from Station 2R, 4R, 7, 10L, 11L and 11R nodes and were sent for cytology. Sample was also collected from Station 7 in saline to facilitate FLOW cytometry. Finally a BAL was taken from the RUL to be sent for cultures. The patient tolerated the procedure well without apparent complications. There was no significant blood loss. The bronchoscope was withdrawn. Anesthesia was reversed and the patient was taken to the PACU for recovery.   Samples: 1. Wang needle biopsies from 2R node 2. Wang needle biopsies from 4R node 3. Wang needle biopsies from 7 node 4. Wang needle biopsies from 10L node 5. Wang needle biopsies from 11L node 6. Wang needle biopsies from 11R node 7 RUL BAL for cultures  Plans:  We will review the cytology, pathology and microbiology results with the patient when they become available. If the data is unrevealing then I suggest referral to Thoracic Surgery to arrange for mediastinoscopy.    Baltazar Apo, MD, PhD 09/21/2014, 11:32 AM [facility] [phone] or if no answer [phone]

## 2014-09-21 NOTE — Transfer of Care (Signed)
Immediate Anesthesia Transfer of Care Note  Patient: [name]  Procedure(s) Performed: Procedure(s): VIDEO BRONCHOSCOPY WITH ENDOBRONCHIAL ULTRASOUND (N/A)  Patient Location: PACU  Anesthesia Type:General  Level of Consciousness: awake, alert  and oriented  Airway & Oxygen Therapy: Patient Spontanous Breathing and Patient connected to nasal cannula oxygen  Post-op Assessment: Report given to PACU RN and Post -op Vital signs reviewed and stable  Post vital signs: Reviewed and stable  Complications: No apparent anesthesia complications

## 2014-09-21 NOTE — H&P (View-Only) (Signed)
Name: [name] MRN: [id number] DOB: [birth year]    ADMISSION DATE:  09/18/2014 CONSULTATION DATE:  09/20/2014  REFERRING MD :  Tat  CHIEF COMPLAINT:  Abnormal CT chest  BRIEF PATIENT DESCRIPTION:  49 yo female former smoker presented with Lt sided numbness with possible conversion disorder.  She was noted to have abnormal CXR.  She had CT chest with showed mediastinal/hilar LAN and PCCM consulted.  SIGNIFICANT EVENTS  1/19 Admit, neurology see and sign off  STUDIES:  1/19 CT head >> 12 x 6 mm Lt frontal lytic lesion 1/19 MRI brain >> 12 mm Lt frontal lytic lesion 1/19 Bone scan >> no other lesions 1/20 CT chest >> Rt paratracheal LAN 1.5 cm, Rt subcarinal LAN 2 cm, Rt hilar LAN 1.4 cm, Lt hilar LAN 1.7 cm 1/20 CT abd/pelvis >> 1.6 cm LAN at gastrohepatic ligament, 1.3 cm LAN at portal vein, 0.9 cm LAN Rt renal artery   HISTORY OF PRESENT ILLNESS:   She presented with weakness.  There was concern for stroke, but her evaluation for stroke was negative.  She had CXR which showed hilar fullness, and CT head which showed frontal lytic lesion.  She had CT chest which showed findings detailed above.  She gets occasional cough, wheeze, and sharp chest pain.  She has gland swelling in her neck intermittently, but denies facial swelling.  She denies skin rashes, or joint pain.  She has not had recent fever, and denies recent respiratory infections.  PAST MEDICAL HISTORY :   has a past medical history of IC (interstitial cystitis); RLS (restless legs syndrome); GERD (gastroesophageal reflux disease); Depression; Hypothyroidism; Environmental allergies; IBS (irritable bowel syndrome); Frequency of urination; Nocturia; SUI (stress urinary incontinence, female); Erosion of suburethral sling; Herpes simplex type 2 infection; NSVD (normal spontaneous vaginal delivery); Asthma; Heart murmur; Chronic bronchitis; Migraine; Headache; Seizures; Arthritis; Chronic back pain; and Anxiety.  has past  surgical history that includes Cesarean section (2000); Pelvic laparoscopy (1990's); Tonsillectomy (1986); Appendectomy (1985); Anterior cervical decomp/discectomy fusion (2011); Abdominal hysterectomy (2006); Knee arthroscopy (Right); Lumbar fusion (12-04-2008); Lumbar disc surgery (02-02-2007); LYNX RETROPUBIC SUBURETHRAL SLING (03-02-2007); Back surgery (2008); Tubal ligation (2001); Pubovaginal sling (N/A, 11/30/2012); and cysto with hydrodistension (N/A, 11/30/2012). Prior to Admission medications   Medication Sig Start Date End Date Taking? Authorizing Provider  albuterol (PROVENTIL HFA;VENTOLIN HFA) 108 (90 BASE) MCG/ACT inhaler Inhale 2 puffs into the lungs every 6 (six) hours as needed. For shortness of breath 04/25/13  Yes Elayne Snare, MD  diazepam (VALIUM) 2 MG tablet Take 2 mg by mouth at bedtime.   Yes Historical Provider, MD  dicyclomine (BENTYL) 20 MG tablet Take 20 mg by mouth every 6 (six) hours.   Yes Historical Provider, MD  levothyroxine (SYNTHROID, LEVOTHROID) 137 MCG tablet Take 137 mcg by mouth every morning.    Yes Historical Provider, MD  meloxicam (MOBIC) 7.5 MG tablet Take 7.5 mg by mouth 2 (two) times daily as needed. 06/27/14  Yes Historical Provider, MD  omeprazole (PRILOSEC) 40 MG capsule Take 40 mg by mouth at bedtime.   Yes Historical Provider, MD  PARoxetine (PAXIL) 20 MG tablet Take 20 mg by mouth at bedtime.   Yes Historical Provider, MD  rOPINIRole (REQUIP) 2 MG tablet Take 2 mg by mouth at bedtime.   Yes Historical Provider, MD  traZODone (DESYREL) 50 MG tablet Take 50 mg by mouth at bedtime.   Yes Historical Provider, MD  valACYclovir (VALTREX) 500 MG tablet Take 500 mg by mouth at  bedtime.   Yes Historical Provider, MD  ondansetron (ZOFRAN ODT) 4 MG disintegrating tablet 60m ODT q4 hours prn nausea/vomit Patient not taking: Reported on 09/18/2014 05/24/14   BVerl Dicker PA-C   Allergies  Allergen Reactions  . Bactrim [Sulfamethoxazole-Trimethoprim] Other (See  Comments)    Fever   . Percocet [Oxycodone-Acetaminophen] Itching  . Vicodin [Hydrocodone-Acetaminophen] Itching    FAMILY HISTORY:  family history includes Cancer in her father, maternal grandfather, and paternal grandfather; Diabetes in her mother; Heart disease in her maternal grandmother, mother, and paternal grandmother; Hypertension in her father and mother. SOCIAL HISTORY:  reports that she quit smoking about 22 months ago. Her smoking use included Cigarettes. She has a 7 pack-year smoking history. She has never used smokeless tobacco. She reports that she does not drink alcohol or use illicit drugs.  REVIEW OF SYSTEMS:   Negative except above  SUBJECTIVE:   VITAL SIGNS: Temp:  [98.4 F (36.9 C)-99.5 F (37.5 C)] 99.5 F (37.5 C) (01/21 0536) Pulse Rate:  [73-79] 73 (01/21 0536) Resp:  [16-18] 18 (01/21 0536) BP: (108-116)/(62-65) 116/64 mmHg (01/21 0536) SpO2:  [93 %-95 %] 93 % (01/21 0536)  PHYSICAL EXAMINATION: General:  Pleasant, no distress Neuro:  Alert, normal strength, CN intact HEENT:  Wears glasses, pupils reactive, no LAN, MP 2, no oral lesions Cardiovascular: regular, no murmur Lungs:  No wheeze/rales/dullness Abdomen:  Soft, no organomegaly, +bowel sounds Musculoskeletal: no edema Skin: no rashes  CBC Recent Labs     09/18/14  1339  09/18/14  1353  09/19/14  0415  WBC  6.6   --   5.1  HGB  12.7  13.9  12.1  HCT  36.5  41.0  35.8*  PLT  247   --   206    Coag's Recent Labs     09/18/14  1339  APTT  29  INR  0.96    BMET Recent Labs     09/18/14  1339  09/18/14  1353  09/19/14  0415  09/20/14  0730  NA  139  139  140  139  K  4.2  4.1  4.2  4.4  CL  107  105  109  106  CO2  25   --   25  26  BUN  20  24*  19  12  CREATININE  1.17*  1.20*  0.80  0.83  GLUCOSE  98  102*  94  109*    Electrolytes Recent Labs     09/18/14  1339  09/19/14  0415  09/20/14  0730  CALCIUM  9.1  8.9  8.9    Liver Enzymes Recent Labs      09/18/14  1339  09/19/14  0415  AST  27  22  ALT  24  23  ALKPHOS  100  80  BILITOT  0.6  0.5  ALBUMIN  4.2  3.5    Cardiac Enzymes Recent Labs     09/18/14  1633 09/19/14  09/19/14  0415  TROPONINI  <0.03  <0.03  <0.03    Imaging Dg Chest 2 View  09/18/2014   CLINICAL DATA:  Headache today. Frontal skull lesion noted on current CT and MRI  EXAM: CHEST  2 VIEW  COMPARISON:  07/22/2009.  FINDINGS: There is right peritracheal soft tissue prominence that may be vascular. Adenopathy or mass is possible.  Mediastinum otherwise normal in contour. Heart normal in size. No hilar masses.  Clear lungs.  No  pleural effusion or pneumothorax.  Status post low anterior cervical spine fusion new since the prior study. Bony thorax otherwise unremarkable.  IMPRESSION: Opacity along the right peritracheal stripe could reflect a mass or adenopathy. It may, alternatively, all be vascular. Recommend followup chest CT.  Clear lungs.   Electronically Signed   By: Lajean Manes M.D.   On: 09/18/2014 21:48   Ct Head Wo Contrast  09/18/2014   CLINICAL DATA:  Code stroke, sudden onset LEFT shoulder weakness and LEFT chest pain, LEFT side weakness, history GERD, asthma, former smoker  EXAM: CT HEAD WITHOUT CONTRAST  TECHNIQUE: Contiguous axial images were obtained from the base of the skull through the vertex without intravenous contrast.  COMPARISON:  06/10/2012  FINDINGS: Normal ventricular morphology.  No midline shift or mass effect.  Normal appearance of brain parenchyma.  No intracranial hemorrhage, mass lesion or evidence acute infarction.  No extra-axial fluid collections.  Visualized paranasal sinuses and mastoid air cells clear.  New lytic lesion in LEFT frontal bone since 2013, 12 x 6 mm diameter.  No other definite focal osseous abnormalities.  IMPRESSION: No acute intracranial abnormalities.  New 12 x 6 mm lytic lesion in LEFT frontal bone since 2013, nonspecific; this could represent a metastatic focus or  myeloma ; fibrous dysplasia could cause the appearance that is identified though lesion is new since 2013.  Does patient have a history of malignancy or myeloma?  Critical Value/emergent results were called by telephone at the time of interpretation on 09/18/2014 at 1406 hr to Dr. Doy Mince from the Stroke Service who verbally acknowledged these results.   Electronically Signed   By: Lavonia Dana M.D.   On: 09/18/2014 14:06   Ct Chest W Contrast  09/19/2014   CLINICAL DATA:  Thickened right paratracheal stripe on previous day's chest radiograph. Solitary lytic left frontal bone lesion on recent CT of the head and brain MRI.  EXAM: CT CHEST, ABDOMEN, AND PELVIS WITH CONTRAST  TECHNIQUE: Multidetector CT imaging of the chest, abdomen and pelvis was performed following the standard protocol during bolus administration of intravenous contrast.  CONTRAST:  195m OMNIPAQUE IOHEXOL 300 MG/ML  SOLN  COMPARISON:  CT abdomen pelvis, 05/24/2014. Previous day's chest radiograph.  FINDINGS: CT CHEST FINDINGS  There is mediastinal and bilateral hilar adenopathy. Several reference measurements were made. Nodes pending from the AP window to the prevascular space adjacent to the aortic arch measures 16 mm in short axis. Right peritracheal node approximately 3 cm above the chronic measures 15 mm in short axis. Right subcarinal node measures 2 cm in short axis. Right inferolateral hilar node measures 14 mm in short axis. Left infrahilar node measures 17 mm in short axis.  No neck base or axillary masses or adenopathy.  Heart normal in size and configuration. No coronary artery calcifications. Great vessels are within normal limits.  Lungs are clear.  No pleural effusion.  CT ABDOMEN AND PELVIS FINDINGS  There is upper abdominal adenopathy. Node just superior to the portal vein along the gastrohepatic ligament measures 16 mm in short axis. Node anterior to the portal vein measures 13 mm short axis. Node anterior to the aorta at the  level of the right renal artery measures 9 mm in short axis. Several other prominent nodes are noted along the retroperitoneum adjacent to the aorta and inferior vena cava. No enlarged pelvic lymph nodes. No enlarged mesenteric lymph nodes.  Liver, spleen, gallbladder, pancreas, adrenal glands, kidneys, ureters, bladder: Normal.  Uterus surgically absent.  No pelvic masses.  No ascites.  Colon and small bowel are unremarkable.  MUSCULOSKELETAL  No osteoblastic or osteolytic lesions. Status post posterior spinal fusion from L4 through S1. Orthopedic hardware well-seated and aligned.  IMPRESSION: 1. Mediastinal and hilar adenopathy as well of adenopathy in the upper abdomen most evident along the gastrohepatic ligament/porta hepatis. 2. No abnormality of the lungs.  No interstitial lung disease. 3. No abdominal organ abnormality. 4. No lytic bone lesions seen on the included field of view. 5. Differential diagnosis for the adenopathy includes sarcoidosis which could cause the significant mediastinal and hilar adenopathy as well as the upper abdominal adenopathy, and can cause lytic bone lesions. Lymphoma is also in the differential diagnosis.   Electronically Signed   By: Lajean Manes M.D.   On: 09/19/2014 20:06   Mr Brain Wo Contrast  09/18/2014   CLINICAL DATA:  49 year old female code stroke left side weakness. Initial encounter.  EXAM: MRI HEAD WITHOUT CONTRAST  TECHNIQUE: Multiplanar, multiecho pulse sequences of the brain and surrounding structures were obtained without intravenous contrast.  COMPARISON:  Head CT without contrast 1345 hr today, and earlier. Brain MRI 10/10/2004.  FINDINGS: No restricted diffusion to suggest acute infarction. Major intracranial vascular flow voids are stable.  No midline shift, mass effect, evidence of mass lesion, ventriculomegaly, extra-axial collection or acute intracranial hemorrhage. Cervicomedullary junction and pituitary are within normal limits. Pearline Cables and white matter  signal is within normal limits for age throughout the brain.  Visualized bone marrow signal is largely within normal limits, however, there is a 10-12 mm focus of abnormal marrow signal along the left frontal convexity associated with abnormal diffusion signal (series 3, image 27 and series 5, image 14. This was also described on the earlier head CT today, and does appear to be new since 2005.  Negative visualized cervical spine with partial visualization of anterior fusion hardware and hardware susceptibility artifact.  Visible internal auditory structures appear normal. Visualized paranasal sinuses and mastoids are clear. Visualized orbit soft tissues are within normal limits. Visualized scalp soft tissues are within normal limits.  IMPRESSION: 1. No acute infarct. Negative for age non contrast MRI appearance of the brain. 2. 10-12 mm skull lesion along the anterior frontal convexity, suspicious for bone metastasis or other hypercellular lesion and new since 2013.   Electronically Signed   By: Lars Pinks M.D.   On: 09/18/2014 15:39   Ct Abdomen Pelvis W Contrast  09/19/2014   CLINICAL DATA:  Thickened right paratracheal stripe on previous day's chest radiograph. Solitary lytic left frontal bone lesion on recent CT of the head and brain MRI.  EXAM: CT CHEST, ABDOMEN, AND PELVIS WITH CONTRAST  TECHNIQUE: Multidetector CT imaging of the chest, abdomen and pelvis was performed following the standard protocol during bolus administration of intravenous contrast.  CONTRAST:  1110m OMNIPAQUE IOHEXOL 300 MG/ML  SOLN  COMPARISON:  CT abdomen pelvis, 05/24/2014. Previous day's chest radiograph.  FINDINGS: CT CHEST FINDINGS  There is mediastinal and bilateral hilar adenopathy. Several reference measurements were made. Nodes pending from the AP window to the prevascular space adjacent to the aortic arch measures 16 mm in short axis. Right peritracheal node approximately 3 cm above the chronic measures 15 mm in short axis.  Right subcarinal node measures 2 cm in short axis. Right inferolateral hilar node measures 14 mm in short axis. Left infrahilar node measures 17 mm in short axis.  No neck base or axillary masses or adenopathy.  Heart normal in size  and configuration. No coronary artery calcifications. Great vessels are within normal limits.  Lungs are clear.  No pleural effusion.  CT ABDOMEN AND PELVIS FINDINGS  There is upper abdominal adenopathy. Node just superior to the portal vein along the gastrohepatic ligament measures 16 mm in short axis. Node anterior to the portal vein measures 13 mm short axis. Node anterior to the aorta at the level of the right renal artery measures 9 mm in short axis. Several other prominent nodes are noted along the retroperitoneum adjacent to the aorta and inferior vena cava. No enlarged pelvic lymph nodes. No enlarged mesenteric lymph nodes.  Liver, spleen, gallbladder, pancreas, adrenal glands, kidneys, ureters, bladder: Normal.  Uterus surgically absent.  No pelvic masses.  No ascites.  Colon and small bowel are unremarkable.  MUSCULOSKELETAL  No osteoblastic or osteolytic lesions. Status post posterior spinal fusion from L4 through S1. Orthopedic hardware well-seated and aligned.  IMPRESSION: 1. Mediastinal and hilar adenopathy as well of adenopathy in the upper abdomen most evident along the gastrohepatic ligament/porta hepatis. 2. No abnormality of the lungs.  No interstitial lung disease. 3. No abdominal organ abnormality. 4. No lytic bone lesions seen on the included field of view. 5. Differential diagnosis for the adenopathy includes sarcoidosis which could cause the significant mediastinal and hilar adenopathy as well as the upper abdominal adenopathy, and can cause lytic bone lesions. Lymphoma is also in the differential diagnosis.   Electronically Signed   By: Lajean Manes M.D.   On: 09/19/2014 20:06   Dg Bone Survey Met  09/18/2014   CLINICAL DATA:  Frontal skull lesion noted on  current head CT and brain MRI. Headache today.  EXAM: METASTATIC BONE SURVEY  COMPARISON:  None.  FINDINGS: Vague area of lucency suggested along the left frontal region on the frontal skull view. This may reflect the lesion seen on the current CT and brain MRI. Margins are ill-defined.  No other evidence of a skull lesion.  Elsewhere in the skeleton, there are no radiolucent/osteolytic lesions. There are no osteoblastic lesions.  Status post into cervical spine fusion from C4 through C7. Orthopedic hardware is well-seated and aligned.  Status post L5-S1 posterior lumbar spine fusion with orthopedic hardware well-seated and aligned.  Bones are normally mineralized. No significant soft tissue abnormality.  IMPRESSION: 1. Lesions seen in the left frontal bone on the current CT and brain MRI is suggested by a vague area of lucency on the frontal skull thumb. 2. No other evidence of a bone lesion. Lack of other bone lesions argues against metastatic or neoplastic disease.   Electronically Signed   By: Lajean Manes M.D.   On: 09/18/2014 21:53     ASSESSMENT / PLAN:  49 yo female former smoker with incidental finding of mediastinal/hilar/abdominal lymphadenopathy; also has Lt frontal lytic skull lesion.  Concern is for inflammatory process (such as sarcoidosis) vs malignancy (such as lymphoma). Plan: - she will need tissue sampling with bronchoscopy using endobronchial ultrasound  - procedure explained to pt >> risks detailed as bleeding, infection, pneumothorax, and non diagnosis - also discussed inherent risks associated with general anesthesia  She would not need to remain in hospital for procedure >> if she is otherwise stable for d/c home, then her bronchoscopy will be schedule as outpt.  Chesley Mires, MD [facility] 09/20/2014, 9:54 AM Pager:  [phone] After 3pm call: [phone]

## 2014-09-23 LAB — CULTURE, RESPIRATORY

## 2014-09-23 LAB — CULTURE, RESPIRATORY W GRAM STAIN

## 2014-09-24 ENCOUNTER — Encounter (HOSPITAL_COMMUNITY): Payer: Self-pay | Admitting: Emergency Medicine

## 2014-09-24 LAB — QUANTIFERON IN TUBE
QFT TB AG MINUS NIL VALUE: 0 IU/mL
QUANTIFERON TB AG VALUE: 0.03 IU/mL
QUANTIFERON TB GOLD: NEGATIVE
Quantiferon Nil Value: 0.03 IU/mL

## 2014-09-24 LAB — UIFE/LIGHT CHAINS/TP QN, 24-HR UR
Albumin, U: DETECTED
Alpha 1, Urine: NOT DETECTED
Alpha 2, Urine: NOT DETECTED
Beta, Urine: NOT DETECTED
Gamma Globulin, Urine: NOT DETECTED
TOTAL PROTEIN, URINE-UPE24: 4 mg/dL — AB (ref 5–24)

## 2014-09-24 LAB — QUANTIFERON TB GOLD ASSAY (BLOOD)

## 2014-09-24 NOTE — Anesthesia Postprocedure Evaluation (Signed)
  Anesthesia Post-op Note  Patient: [name]  Procedure(s) Performed: Procedure(s) (LRB): VIDEO BRONCHOSCOPY WITH ENDOBRONCHIAL ULTRASOUND (N/A)  Patient Location: PACU  Anesthesia Type: General  Level of Consciousness: awake and alert   Airway and Oxygen Therapy: Patient Spontanous Breathing  Post-op Pain: mild  Post-op Assessment: Post-op Vital signs reviewed, Patient's Cardiovascular Status Stable, Respiratory Function Stable, Patent Airway and No signs of Nausea or vomiting  Last Vitals:  Filed Vitals:   09/21/14 1307  BP: 119/77  Pulse: 97  Temp: 38.1 C  Resp: 16    Post-op Vital Signs: stable   Complications: No apparent anesthesia complications

## 2014-09-28 ENCOUNTER — Telehealth: Payer: Self-pay | Admitting: Emergency Medicine

## 2014-09-28 NOTE — Telephone Encounter (Signed)
Called pt's home and cell #'s, no answer. Left messages on both All cytology is negative for malignancy or granulomas. Unfortunately there was not enough material for them to run FLOW cytometry. I suspect that once we discuss with Dr Halford Chessman we will decide to pursue mediastinoscopy or some means to get a full node for evaluation.   Pt called me back - I reviewed results with her. She knows that we may be moving towards mediastinoscopy. We will wait for cx data to come back, discuss with Dr Halford Chessman. She has an appt with TP in the next week.

## 2014-09-28 NOTE — Telephone Encounter (Signed)
Pt is returning call to Rb for results

## 2014-10-01 ENCOUNTER — Other Ambulatory Visit (INDEPENDENT_AMBULATORY_CARE_PROVIDER_SITE_OTHER): Payer: Managed Care, Other (non HMO)

## 2014-10-01 ENCOUNTER — Other Ambulatory Visit: Payer: Self-pay | Admitting: Endocrinology

## 2014-10-01 DIAGNOSIS — R531 Weakness: Secondary | ICD-10-CM

## 2014-10-01 DIAGNOSIS — R590 Localized enlarged lymph nodes: Secondary | ICD-10-CM

## 2014-10-01 DIAGNOSIS — R599 Enlarged lymph nodes, unspecified: Secondary | ICD-10-CM

## 2014-10-01 LAB — CBC WITH DIFFERENTIAL/PLATELET
Basophils Absolute: 0 10*3/uL (ref 0.0–0.1)
Basophils Relative: 0.6 % (ref 0.0–3.0)
EOS ABS: 0.2 10*3/uL (ref 0.0–0.7)
Eosinophils Relative: 2.8 % (ref 0.0–5.0)
HEMATOCRIT: 36.3 % (ref 36.0–46.0)
Hemoglobin: 12.5 g/dL (ref 12.0–15.0)
LYMPHS ABS: 2 10*3/uL (ref 0.7–4.0)
LYMPHS PCT: 27.9 % (ref 12.0–46.0)
MCHC: 34.5 g/dL (ref 30.0–36.0)
MCV: 84.4 fl (ref 78.0–100.0)
Monocytes Absolute: 0.6 10*3/uL (ref 0.1–1.0)
Monocytes Relative: 7.6 % (ref 3.0–12.0)
NEUTROS PCT: 61.1 % (ref 43.0–77.0)
Neutro Abs: 4.5 10*3/uL (ref 1.4–7.7)
Platelets: 254 10*3/uL (ref 150.0–400.0)
RBC: 4.31 Mil/uL (ref 3.87–5.11)
RDW: 14.4 % (ref 11.5–15.5)
WBC: 7.3 10*3/uL (ref 4.0–10.5)

## 2014-10-01 LAB — BASIC METABOLIC PANEL
BUN: 20 mg/dL (ref 6–23)
CALCIUM: 9.3 mg/dL (ref 8.4–10.5)
CO2: 25 mEq/L (ref 19–32)
Chloride: 107 mEq/L (ref 96–112)
Creatinine, Ser: 0.96 mg/dL (ref 0.40–1.20)
GFR: 65.95 mL/min (ref >60.00)
GLUCOSE: 93 mg/dL (ref 70–99)
Potassium: 4.2 mEq/L (ref 3.5–5.1)
Sodium: 137 mEq/L (ref 135–145)

## 2014-10-02 ENCOUNTER — Encounter: Payer: Self-pay | Admitting: Adult Health

## 2014-10-02 ENCOUNTER — Ambulatory Visit (INDEPENDENT_AMBULATORY_CARE_PROVIDER_SITE_OTHER)
Admission: RE | Admit: 2014-10-02 | Discharge: 2014-10-02 | Disposition: A | Discharge status: Home / Self Care | Payer: BLUE CROSS/BLUE SHIELD | Source: Ambulatory Visit | Attending: Adult Health | Admitting: Adult Health

## 2014-10-02 ENCOUNTER — Ambulatory Visit (INDEPENDENT_AMBULATORY_CARE_PROVIDER_SITE_OTHER): Payer: BLUE CROSS/BLUE SHIELD | Admitting: Adult Health

## 2014-10-02 VITALS — BP 116/68 | HR 74 | Temp 98.1°F | Ht 66.5 in | Wt 171.2 lb

## 2014-10-02 DIAGNOSIS — J4520 Mild intermittent asthma, uncomplicated: Secondary | ICD-10-CM

## 2014-10-02 DIAGNOSIS — R599 Enlarged lymph nodes, unspecified: Secondary | ICD-10-CM

## 2014-10-02 DIAGNOSIS — R590 Localized enlarged lymph nodes: Secondary | ICD-10-CM

## 2014-10-02 NOTE — Patient Instructions (Signed)
We are referring you to Thoracic Surgeon for evaluation of mediastinal adnenopathy.  Follow up Dr. Halford Chessman  In 4-6 weeks and As needed

## 2014-10-02 NOTE — Progress Notes (Signed)
   Subjective:    Patient ID: [name], female    DOB: 1966-08-13, 49 y.o.   MRN: [id number]  HPI 49 yo WF former smoker seen for pulmonary consult for hilar/ mediastinal /abdominal lymphadenopathy, along with left frontal skull lesion   10/02/2014 [facility] follow up  Pt was admitted 1/19-1/22 for ;left sided weakness. CT scan of head was neg for acute process. MRI /CT showed a left frontal skull lesion.   Sensory disturbance and left sided weakness completely resolved .   She underwent a bronchoscopy. Path/cytology was nondiagnostic, neg for maliganancy /granulomas. Cx were neg. We discussed her results and options for further testing.  She denies any chest pain, orthopnea, edema, hemoptysis, or fever.  Complains of dry cough for 1 week.  No further extremity weakness, speech or visual disturbances.    Review of Systems Constitutional:   No  weight loss, night sweats,  Fevers, chills,  +fatigue, or  lassitude.  HEENT:   No headaches,  Difficulty swallowing,  Tooth/dental problems, or  Sore throat,                No sneezing, itching, ear ache, nasal congestion, post nasal drip,   CV:  No chest pain,  Orthopnea, PND, swelling in lower extremities, anasarca, dizziness, palpitations, syncope.   GI  No heartburn, indigestion, abdominal pain, nausea, vomiting, diarrhea, change in bowel habits, loss of appetite, bloody stools.   Resp:  +cough /dry   No chest wall deformity  Skin: no rash or lesions.  GU: no dysuria, change in color of urine, no urgency or frequency.  No flank pain, no hematuria   MS:  No joint pain or swelling.  No decreased range of motion.  No back pain.  Psych:  No change in mood or affect. No depression or anxiety.  No memory loss.         Objective:   Physical Exam GEN: A/Ox3; pleasant , NAD, well nourished  HEENT:  [place]/AT,  EACs-clear, TMs-wnl, NOSE-clear, THROAT-clear, no lesions, no postnasal drip or exudate noted.   NECK:  Supple w/ fair  ROM; no JVD; normal carotid impulses w/o bruits; no thyromegaly or nodules palpated; no lymphadenopathy.  RESP  Clear  P & A; w/o, wheezes/ rales/ or rhonchi.no accessory muscle use, no dullness to percussion  CARD:  RRR, no m/r/g  , no peripheral edema, pulses intact, no cyanosis or clubbing.  GI:   Soft & nt; nml bowel sounds; no organomegaly or masses detected.  Musco: Warm bil, no deformities or joint swelling noted.   Neuro: alert, no focal deficits noted.    Skin: Warm, no lesions or rashes  CT chest and abd 09/19/14  Mediastinal and hilar adenopathy as well of adenopathy in the upper abdomen most evident along the gastrohepatic ligament/porta hepatis. 2. No abnormality of the lungs. No interstitial lung disease. 3. No abdominal organ abnormality. 4. No lytic bone lesions seen on the included field of view. 5. Differential diagnosis for the adenopathy includes sarcoidosis which could cause the significant mediastinal and hilar adenopathy as well as the upper abdominal adenopathy, and can cause lytic bone lesions. Lymphoma is also in the differential diagnosis.  MRI Brain 09/18/14  No acute infarct. Negative for age non contrast MRI appearance of the brain. 2. 10-12 mm skull lesion along the anterior frontal convexity, suspicious for bone metastasis or other hypercellular lesion and new since 2013.     Assessment & Plan:

## 2014-10-03 ENCOUNTER — Emergency Department (HOSPITAL_COMMUNITY)
Admission: EM | Admit: 2014-10-03 | Discharge: 2014-10-03 | Disposition: A | Discharge status: Home / Self Care | Payer: BLUE CROSS/BLUE SHIELD | Attending: Emergency Medicine | Admitting: Emergency Medicine

## 2014-10-03 ENCOUNTER — Encounter (HOSPITAL_COMMUNITY): Payer: Self-pay | Admitting: *Deleted

## 2014-10-03 ENCOUNTER — Emergency Department (HOSPITAL_COMMUNITY): Payer: BLUE CROSS/BLUE SHIELD

## 2014-10-03 DIAGNOSIS — R05 Cough: Secondary | ICD-10-CM | POA: Diagnosis not present

## 2014-10-03 DIAGNOSIS — Z8619 Personal history of other infectious and parasitic diseases: Secondary | ICD-10-CM | POA: Insufficient documentation

## 2014-10-03 DIAGNOSIS — M1389 Other specified arthritis, multiple sites: Secondary | ICD-10-CM | POA: Insufficient documentation

## 2014-10-03 DIAGNOSIS — K219 Gastro-esophageal reflux disease without esophagitis: Secondary | ICD-10-CM | POA: Insufficient documentation

## 2014-10-03 DIAGNOSIS — E039 Hypothyroidism, unspecified: Secondary | ICD-10-CM | POA: Insufficient documentation

## 2014-10-03 DIAGNOSIS — R630 Anorexia: Secondary | ICD-10-CM | POA: Diagnosis not present

## 2014-10-03 DIAGNOSIS — R011 Cardiac murmur, unspecified: Secondary | ICD-10-CM | POA: Insufficient documentation

## 2014-10-03 DIAGNOSIS — F419 Anxiety disorder, unspecified: Secondary | ICD-10-CM | POA: Insufficient documentation

## 2014-10-03 DIAGNOSIS — F329 Major depressive disorder, single episode, unspecified: Secondary | ICD-10-CM | POA: Insufficient documentation

## 2014-10-03 DIAGNOSIS — Z79899 Other long term (current) drug therapy: Secondary | ICD-10-CM | POA: Diagnosis not present

## 2014-10-03 DIAGNOSIS — Z791 Long term (current) use of non-steroidal anti-inflammatories (NSAID): Secondary | ICD-10-CM | POA: Insufficient documentation

## 2014-10-03 DIAGNOSIS — Z8742 Personal history of other diseases of the female genital tract: Secondary | ICD-10-CM | POA: Diagnosis not present

## 2014-10-03 DIAGNOSIS — J45901 Unspecified asthma with (acute) exacerbation: Secondary | ICD-10-CM | POA: Diagnosis not present

## 2014-10-03 DIAGNOSIS — G8929 Other chronic pain: Secondary | ICD-10-CM | POA: Insufficient documentation

## 2014-10-03 DIAGNOSIS — K589 Irritable bowel syndrome without diarrhea: Secondary | ICD-10-CM | POA: Insufficient documentation

## 2014-10-03 DIAGNOSIS — R0789 Other chest pain: Secondary | ICD-10-CM

## 2014-10-03 DIAGNOSIS — G40909 Epilepsy, unspecified, not intractable, without status epilepticus: Secondary | ICD-10-CM | POA: Diagnosis not present

## 2014-10-03 DIAGNOSIS — G2581 Restless legs syndrome: Secondary | ICD-10-CM | POA: Diagnosis not present

## 2014-10-03 DIAGNOSIS — R079 Chest pain, unspecified: Secondary | ICD-10-CM | POA: Diagnosis present

## 2014-10-03 LAB — CBC
HCT: 38 % (ref 36.0–46.0)
Hemoglobin: 13.2 g/dL (ref 12.0–15.0)
MCH: 29.1 pg (ref 26.0–34.0)
MCHC: 34.7 g/dL (ref 30.0–36.0)
MCV: 83.7 fL (ref 78.0–100.0)
Platelets: 230 10*3/uL (ref 150–400)
RBC: 4.54 MIL/uL (ref 3.87–5.11)
RDW: 14.2 % (ref 11.5–15.5)
WBC: 6.2 10*3/uL (ref 4.0–10.5)

## 2014-10-03 LAB — TROPONIN I: Troponin I: <0.03 ng/mL (ref <0.031)

## 2014-10-03 LAB — BASIC METABOLIC PANEL
Anion gap: 10 (ref 5–15)
BUN: 22 mg/dL (ref 6–23)
CO2: 21 mmol/L (ref 19–32)
Calcium: 9.3 mg/dL (ref 8.4–10.5)
Chloride: 107 mmol/L (ref 96–112)
Creatinine, Ser: 0.89 mg/dL (ref 0.50–1.10)
GFR calc Af Amer: 88 mL/min — ABNORMAL LOW (ref >90)
GFR, EST NON AFRICAN AMERICAN: 76 mL/min — AB (ref >90)
GLUCOSE: 107 mg/dL — AB (ref 70–99)
Potassium: 4.4 mmol/L (ref 3.5–5.1)
SODIUM: 138 mmol/L (ref 135–145)

## 2014-10-03 LAB — I-STAT TROPONIN, ED: Troponin i, poc: 0 ng/mL (ref 0.00–0.08)

## 2014-10-03 LAB — D-DIMER, QUANTITATIVE (NOT AT ARMC)

## 2014-10-03 LAB — BRAIN NATRIURETIC PEPTIDE: B NATRIURETIC PEPTIDE 5: 38.8 pg/mL (ref 0.0–100.0)

## 2014-10-03 MED ORDER — ASPIRIN 81 MG PO CHEW
324.0000 mg | CHEWABLE_TABLET | Freq: Once | ORAL | Status: AC
Start: 2014-10-03 — End: 2014-10-03
  Administered 2014-10-03: 324 mg via ORAL
  Filled 2014-10-03: qty 4

## 2014-10-03 MED ORDER — MORPHINE SULFATE 4 MG/ML IJ SOLN
4.0000 mg | Freq: Once | INTRAMUSCULAR | Status: AC
  Administered 2014-10-03: 4 mg via INTRAVENOUS
  Filled 2014-10-03: qty 1

## 2014-10-03 NOTE — Discharge Instructions (Signed)
Chest Pain (Nonspecific) There is no evidence of heart attack or blood clot in the lung. Follow-up with your specialist regarding your lymph nodes as scheduled. Return to the ED if you develop new or worsening symptoms. It is often hard to give a specific diagnosis for the cause of chest pain. There is always a chance that your pain could be related to something serious, such as a heart attack or a blood clot in the lungs. You need to follow up with your health care provider for further evaluation. CAUSES   Heartburn.  Pneumonia or bronchitis.  Anxiety or stress.  Inflammation around your heart (pericarditis) or lung (pleuritis or pleurisy).  A blood clot in the lung.  A collapsed lung (pneumothorax). It can develop suddenly on its own (spontaneous pneumothorax) or from trauma to the chest.  Shingles infection (herpes zoster virus). The chest wall is composed of bones, muscles, and cartilage. Any of these can be the source of the pain.  The bones can be bruised by injury.  The muscles or cartilage can be strained by coughing or overwork.  The cartilage can be affected by inflammation and become sore (costochondritis). DIAGNOSIS  Lab tests or other studies may be needed to find the cause of your pain. Your health care provider may have you take a test called an ambulatory electrocardiogram (ECG). An ECG records your heartbeat patterns over a 24-hour period. You may also have other tests, such as:  Transthoracic echocardiogram (TTE). During echocardiography, sound waves are used to evaluate how blood flows through your heart.  Transesophageal echocardiogram (TEE).  Cardiac monitoring. This allows your health care provider to monitor your heart rate and rhythm in real time.  Holter monitor. This is a portable device that records your heartbeat and can help diagnose heart arrhythmias. It allows your health care provider to track your heart activity for several days, if needed.  Stress  tests by exercise or by giving medicine that makes the heart beat faster. TREATMENT   Treatment depends on what may be causing your chest pain. Treatment may include:  Acid blockers for heartburn.  Anti-inflammatory medicine.  Pain medicine for inflammatory conditions.  Antibiotics if an infection is present.  You may be advised to change lifestyle habits. This includes stopping smoking and avoiding alcohol, caffeine, and chocolate.  You may be advised to keep your head raised (elevated) when sleeping. This reduces the chance of acid going backward from your stomach into your esophagus. Most of the time, nonspecific chest pain will improve within 2-3 days with rest and mild pain medicine.  HOME CARE INSTRUCTIONS   If antibiotics were prescribed, take them as directed. Finish them even if you start to feel better.  For the next few days, avoid physical activities that bring on chest pain. Continue physical activities as directed.  Do not use any tobacco products, including cigarettes, chewing tobacco, or electronic cigarettes.  Avoid drinking alcohol.  Only take medicine as directed by your health care provider.  Follow your health care provider's suggestions for further testing if your chest pain does not go away.  Keep any follow-up appointments you made. If you do not go to an appointment, you could develop lasting (chronic) problems with pain. If there is any problem keeping an appointment, call to reschedule. SEEK MEDICAL CARE IF:   Your chest pain does not go away, even after treatment.  You have a rash with blisters on your chest.  You have a fever. SEEK IMMEDIATE MEDICAL CARE IF:  You have increased chest pain or pain that spreads to your arm, neck, jaw, back, or abdomen.  You have shortness of breath.  You have an increasing cough, or you cough up blood.  You have severe back or abdominal pain.  You feel nauseous or vomit.  You have severe weakness.  You  faint.  You have chills. This is an emergency. Do not wait to see if the pain will go away. Get medical help at once. Call your local emergency services (911 in [place] Do not drive yourself to the hospital. MAKE SURE YOU:   Understand these instructions.  Will watch your condition.  Will get help right away if you are not doing well or get worse. Document Released: 05/27/2005 Document Revised: 08/22/2013 Document Reviewed: 03/22/2008 [facility] Patient Information 2015 [place], [place]. This information is not intended to replace advice given to you by your health care provider. Make sure you discuss any questions you have with your health care provider.

## 2014-10-03 NOTE — ED Notes (Signed)
Patient reports she was getting ready to go to work and had onset of left sided chest pain.  She had difficulty getting out of her car,.  Patient denies any history.  Denies any trauma.  Patient is having sob and moaning in pain.  Patient states she did not rest well last night.  Patient is alert.  Airway is patent.  Patient was seen here 2 weeks ago for stroke sx.  She states she had enlarged lymph  Nodes scattered which is a new dx for her.  Patient states her pain is 10/10.  It is constant, sharp and stabbing.  She denies any recent travel.

## 2014-10-03 NOTE — ED Provider Notes (Signed)
CSN: [id number]     Arrival date & time 10/03/14  0820 History   First MD Initiated Contact with Patient 10/03/14 [phone]     Chief Complaint  Patient presents with  . Chest Pain  . Shortness of Breath     (Consider location/radiation/quality/duration/timing/severity/associated sxs/prior Treatment) HPI Comments: Patient with left-sided chest pain onset around 7 AM with chest pain with shortness of breath. The pain is constant. It is worse with movement and palpation and breathing. She is not have this pain\n the past. She endorses cough and has been ongoing for the last several weeks. She was recently admitted to the hospital with hemiparesis thought to be from conversion disorder and  found to have mediastinal lymphadenopathy. She underwent biopsy by pulmonary and referred to thoracic surgery for meniscopy. She was seen by pulmonary yesterday and told her biopsies were negative so far.  She denies any cardiac history. She denies any abdominal pain, nausea or vomiting. She denies any focal weakness, numbness or tingling.  Patient is a 49 y.o. female presenting with shortness of breath. The history is provided by the patient.  Shortness of Breath Associated symptoms: chest pain and cough   Associated symptoms: no abdominal pain, no fever, no headaches and no vomiting     Past Medical History  Diagnosis Date  . IC (interstitial cystitis)   . RLS (restless legs syndrome)   . GERD (gastroesophageal reflux disease)   . Depression   . Hypothyroidism   . Environmental allergies   . IBS (irritable bowel syndrome)   . Frequency of urination   . Nocturia   . SUI (stress urinary incontinence, female)   . Erosion of suburethral sling   . Herpes simplex type 2 infection   . NSVD (normal spontaneous vaginal delivery)     x3  . Asthma   . Heart murmur     "when I was a baby"  . Chronic bronchitis     "get it close to q yr" (09/18/2014)  . Migraine     "maybe once/month" (09/18/2014)  . Headache      "at least several times/wk" (09/18/2014)  . Seizures     "when I was a teenager"  . Arthritis     "back, hands, neck" (09/18/2014)  . Chronic back pain     "whole spine"  . Anxiety    Past Surgical History  Procedure Laterality Date  . Cesarean section  2000  . Pelvic laparoscopy  1990's    LYSIS ADHESIONS  . Tonsillectomy  1986  . Appendectomy  1985  . Anterior cervical decomp/discectomy fusion  2011  . Abdominal hysterectomy  2006  . Knee arthroscopy Right   . Lumbar fusion  12-04-2008    L4  -- S1  . Lumbar disc surgery  02-02-2007    RIGHT SIDE L5 -- S1  . Lynx retropubic suburethral sling  03-02-2007  . Back surgery  2008  . Tubal ligation  2001    hulka clip  . Pubovaginal sling N/A 11/30/2012    Procedure: Gaynelle Arabian;  Surgeon: Bernestine Amass, MD;  Location: [facility];  Service: Urology;  Laterality: N/A;  1 HR EXAM UNDER ANESTHESIA, EXCISION OF SUB URETHRAL MESH, CYSTO, HOD   . Cysto with hydrodistension N/A 11/30/2012    Procedure: CYSTOSCOPY/HYDRODISTENSION;  Surgeon: Bernestine Amass, MD;  Location: [facility];  Service: Urology;  Laterality: N/A;  . Video bronchoscopy with endobronchial ultrasound N/A 09/21/2014    Procedure: VIDEO  BRONCHOSCOPY WITH ENDOBRONCHIAL ULTRASOUND;  Surgeon: Collene Gobble, MD;  Location: [facility] OR;  Service: Thoracic;  Laterality: N/A;   Family History  Problem Relation Age of Onset  . Diabetes Mother   . Hypertension Mother   . Heart disease Mother   . Cancer Father     LUNG AND BRAIN  . Hypertension Father   . Heart disease Maternal Grandmother   . Cancer Maternal Grandfather     LUNG  . Heart disease Paternal Grandmother   . Cancer Paternal Grandfather    History  Substance Use Topics  . Smoking status: Former Smoker -- 0.50 packs/day for 15 years    Types: Cigarettes    Quit date: 01/15/2014  . Smokeless tobacco: Never Used  . Alcohol Use: No   OB History    Gravida Para Term Preterm  AB TAB SAB Ectopic Multiple Living   4 4        4      Review of Systems  Constitutional: Positive for activity change and appetite change. Negative for fever.  HENT: Negative for congestion.   Respiratory: Positive for cough, chest tightness and shortness of breath.   Cardiovascular: Positive for chest pain.  Gastrointestinal: Negative for nausea, vomiting and abdominal pain.  Genitourinary: Negative for dysuria and hematuria.  Musculoskeletal: Negative for myalgias and arthralgias.  Neurological: Negative for dizziness, weakness and headaches.  A complete 10 system review of systems was obtained and all systems are negative except as noted in the HPI and PMH.      Allergies  Bactrim; Percocet; and Vicodin  Home Medications   Prior to Admission medications   Medication Sig Start Date End Date Taking? Authorizing Provider  albuterol (PROVENTIL HFA;VENTOLIN HFA) 108 (90 BASE) MCG/ACT inhaler Inhale 2 puffs into the lungs every 6 (six) hours as needed. For shortness of breath 04/25/13  Yes Elayne Snare, MD  diazepam (VALIUM) 2 MG tablet Take 2 mg by mouth at bedtime.   Yes Historical Provider, MD  dicyclomine (BENTYL) 20 MG tablet Take 20 mg by mouth every 6 (six) hours.   Yes Historical Provider, MD  levothyroxine (SYNTHROID, LEVOTHROID) 137 MCG tablet Take 137 mcg by mouth every morning.    Yes Historical Provider, MD  meloxicam (MOBIC) 7.5 MG tablet Take 7.5 mg by mouth 2 (two) times daily as needed. 06/27/14  Yes Historical Provider, MD  naproxen sodium (ANAPROX) 220 MG tablet Take 220 mg by mouth 2 (two) times daily with a meal.   Yes Historical Provider, MD  omeprazole (PRILOSEC) 40 MG capsule Take 40 mg by mouth at bedtime.   Yes Historical Provider, MD  PARoxetine (PAXIL) 20 MG tablet Take 20 mg by mouth at bedtime.   Yes Historical Provider, MD  rOPINIRole (REQUIP) 2 MG tablet Take 2 mg by mouth at bedtime.   Yes Historical Provider, MD  traZODone (DESYREL) 50 MG tablet Take 50  mg by mouth at bedtime.   Yes Historical Provider, MD  valACYclovir (VALTREX) 500 MG tablet Take 500 mg by mouth at bedtime.   Yes Historical Provider, MD   BP 113/74 mmHg  Pulse 70  Temp(Src) 98.7 F (37.1 C) (Oral)  Resp 15  Ht 5\' 6"  (1.676 m)  Wt 171 lb (77.565 kg)  BMI 27.61 kg/m2  SpO2 95%  LMP 06/03/2012 Physical Exam  Constitutional: She is oriented to person, place, and time. She appears well-developed and well-nourished. She appears distressed.  uncomfortable  HENT:  Head: Normocephalic and atraumatic.  Mouth/Throat: Oropharynx  is clear and moist. No oropharyngeal exudate.  Eyes: Conjunctivae and EOM are normal. Pupils are equal, round, and reactive to light.  Neck: Normal range of motion. Neck supple.  No meningismus.  Cardiovascular: Normal rate, regular rhythm, normal heart sounds and intact distal pulses.   No murmur heard. Pulmonary/Chest: Effort normal and breath sounds normal. No respiratory distress. She exhibits tenderness.  Reproducible left chest wall tenderness  Abdominal: Soft. There is no tenderness. There is no rebound and no guarding.  Musculoskeletal: Normal range of motion. She exhibits no edema or tenderness.  Neurological: She is alert and oriented to person, place, and time. No cranial nerve deficit. She exhibits normal muscle tone. Coordination normal.  No ataxia on finger to nose bilaterally. No pronator drift. 5/5 strength throughout. CN 2-12 intact. Negative Romberg. Equal grip strength. Sensation intact. Gait is normal.   Skin: Skin is warm.  Psychiatric: She has a normal mood and affect. Her behavior is normal.  Nursing note and vitals reviewed.   ED Course  Procedures (including critical care time) Labs Review Labs Reviewed  BASIC METABOLIC PANEL - Abnormal; Notable for the following:    Glucose, Bld 107 (*)    GFR calc non Af Amer 76 (*)    GFR calc Af Amer 88 (*)    All other components within normal limits  CBC  BRAIN NATRIURETIC  PEPTIDE  TROPONIN I  D-DIMER, QUANTITATIVE  TROPONIN I  I-STAT TROPOININ, ED    Imaging Review Dg Chest 2 View  10/03/2014   CLINICAL DATA:  Cough, shortness of breath and chest pain for 7 days. Recent diagnosis of mediastinal lymphadenopathy.  EXAM: CHEST  2 VIEW  COMPARISON:  CT chest September 19, 2014, chest x-ray September 18, 2014  FINDINGS: The heart size and mediastinal contours are stable. There is opacity along the right peritracheal region consistent with patient's recent diagnosis of mediastinal lymphadenopathy. There is no focal infiltrate, pulmonary edema, or pleural effusion. The visualized skeletal structures are stable P  IMPRESSION: Opacity lungs the right peritracheal region consistent with patient's recent diagnosis of mediastinal lymphadenopathy. There is no focal pneumonia.   Electronically Signed   By: Abelardo Diesel M.D.   On: 10/03/2014 09:24   Dg Chest 2 View  10/03/2014   CLINICAL DATA:  Chest pain began today (sharp piercing pain), dizziness, shortness of breath; hx asthma  EXAM: CHEST - 2 VIEW  COMPARISON:  10/02/2014  FINDINGS: Lungs are clear. Heart size and mediastinal contours are within normal limits. No effusion.  No pneumothorax. Cervical fixation hardware partially visualized.  IMPRESSION: No acute cardiopulmonary disease.   Electronically Signed   By: Arne Cleveland M.D.   On: 10/03/2014 09:14     EKG Interpretation   Date/Time:  Wednesday October 03 2014 08:25:18 EST Ventricular Rate:  76 PR Interval:  136 QRS Duration: 88 QT Interval:  374 QTC Calculation: 420 R Axis:   72 Text Interpretation:  Sinus rhythm RSR' in V1 or V2, probably normal  variant No significant change was found Confirmed by Wyvonnia Dusky  MD,   [phone]) on 10/03/2014 8:36:04 AM      MDM   Final diagnoses:  Chest wall pain   Acute onset of left-sided chest pain that is constant and nonradiating. It is worse with palpation and movement. EKG with normal sinus rhythm. Atypical for  ACS. Patient with known history of mediastinal lymphadenopathy which is currently being worked up.  Chest x-ray negative. EKG unchanged. Troponin negative. D-dimer negative. Doubt PE.  Patient with chest CT recently that showed mediastinal lymphadenopathy which she is undergoing workup.  Troponin Negative 2. D-dimer negative. Pain appears to be musculoskeletal and is reproducible. Patient will be discharged with symptom control. Follow-up as previously outlined for mediastinal lymphadenopathy. Return precautions discussed.  Ezequiel Essex, MD 10/03/14 [phone]

## 2014-10-03 NOTE — Assessment & Plan Note (Signed)
Continue on current regimen  Check cxr  Consider PFT in future -former smoker

## 2014-10-03 NOTE — Assessment & Plan Note (Signed)
Hilar and Mediastinal Adenopathy along with abdominal adenopathy with lytic skull lesion  Bronchoscopy was nondiagnostic with cytology/path neg for granulomas or malignancy  Will refer to [facility] for consideration of mediastinoscopy.  Check cxr today re dry cough   Plan  We are referring you to Thoracic Surgeon for evaluation of mediastinal adnenopathy.  Follow up Dr. Halford Chessman  In 4-6 weeks and As needed

## 2014-10-03 NOTE — Progress Notes (Signed)
Reviewed and agree with assessment/plan. 

## 2014-10-16 ENCOUNTER — Encounter: Payer: BLUE CROSS/BLUE SHIELD | Admitting: Thoracic Surgery (Cardiothoracic Vascular Surgery)

## 2014-10-16 ENCOUNTER — Telehealth: Payer: Self-pay | Admitting: Oncology

## 2014-10-16 NOTE — Telephone Encounter (Signed)
lt mess regarding appt on 10/24/14 at 1:15 w/ Shadad Dx: lymphadenopathy; lytic lesions on xray Referring Dr. Justin Mend

## 2014-10-18 ENCOUNTER — Telehealth: Payer: Self-pay | Admitting: Oncology

## 2014-10-18 NOTE — Telephone Encounter (Signed)
Called [facility] [place] requesting pathology report. lft message. TG

## 2014-10-19 ENCOUNTER — Telehealth: Payer: Self-pay | Admitting: Oncology

## 2014-10-19 LAB — FUNGUS CULTURE W SMEAR: Fungal Smear: NONE SEEN

## 2014-10-19 NOTE — Telephone Encounter (Signed)
Chart delivered on 10/19/14.  TG

## 2014-10-23 ENCOUNTER — Encounter: Payer: Self-pay | Admitting: Thoracic Surgery (Cardiothoracic Vascular Surgery)

## 2014-10-23 ENCOUNTER — Institutional Professional Consult (permissible substitution) (INDEPENDENT_AMBULATORY_CARE_PROVIDER_SITE_OTHER): Payer: BLUE CROSS/BLUE SHIELD | Admitting: Thoracic Surgery (Cardiothoracic Vascular Surgery)

## 2014-10-23 VITALS — BP 130/87 | HR 79 | Resp 16 | Ht 67.0 in | Wt 168.0 lb

## 2014-10-23 DIAGNOSIS — R599 Enlarged lymph nodes, unspecified: Secondary | ICD-10-CM

## 2014-10-23 DIAGNOSIS — M899 Disorder of bone, unspecified: Secondary | ICD-10-CM

## 2014-10-23 DIAGNOSIS — R590 Localized enlarged lymph nodes: Secondary | ICD-10-CM

## 2014-10-23 NOTE — Progress Notes (Signed)
PCP is WEBB, Valla Leaver, MD Referring Provider is Parrett, Fonnie Mu, NP  Chief Complaint  Patient presents with  . Adenopathy    mediastinal, hilar, abdominal and lytic skull lesion...CT C/A/P 09/19/14.Marland Kitchenassess for MEDIASTINSCOPY    HPI: 49 year old woman sent for consultation regarding mediastinal adenopathy.  [name] is a 49 year old woman with a complex medical history. She was admitted in January after presenting with "strokelike" symptoms. She had numbness of the left side of her face, transient blindness in her left eye and left sided weakness. During her workup she had a CT and MRI of the brain. Those showed a lytic skull lesion. A chest x-ray showed a thickened paratracheal stripe. That led to a CT of the chest abdomen and pelvis. It showed diffuse adenopathy.  She says that she was diagnosed with pericarditis and continues to have pain despite nonsteroidal anti-inflammatories. She denies fevers but says she has had chills and sweats. She's had a persistent dry cough. She does experience pleuritic pain with coughing. She has lost 28 pounds over the past 6 months. However she says that much of that is intentional. She's lost only 3 pounds over the past 3 months. 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   Past Medical History  Diagnosis Date  . IC (interstitial cystitis)   . RLS (restless legs syndrome)   . GERD (gastroesophageal reflux disease)   . Depression   . Hypothyroidism   . Environmental allergies   . IBS (irritable bowel syndrome)   . Frequency of urination   . Nocturia   . SUI (stress urinary incontinence, female)   . Erosion of suburethral sling   . Herpes simplex type 2 infection   . NSVD (normal spontaneous vaginal delivery)     x3  . Asthma   . Heart murmur     "when I was a baby"  . Chronic bronchitis     "get it close to q yr" (09/18/2014)  . Migraine     "maybe once/month" (09/18/2014)  . Headache     "at least  several times/wk" (09/18/2014)  . Seizures     "when I was a teenager"  . Arthritis     "back, hands, neck" (09/18/2014)  . Chronic back pain     "whole spine"  . Anxiety     Past Surgical History  Procedure Laterality Date  . Cesarean section  2000  . Pelvic laparoscopy  1990's    LYSIS ADHESIONS  . Tonsillectomy  1986  . Appendectomy  1985  . Anterior cervical decomp/discectomy fusion  2011  . Abdominal hysterectomy  2006  . Knee arthroscopy Right   . Lumbar fusion  12-04-2008    L4  -- S1  . Lumbar disc surgery  02-02-2007    RIGHT SIDE L5 -- S1  . Lynx retropubic suburethral sling  03-02-2007  . Back surgery  2008  . Tubal ligation  2001    hulka clip  . Pubovaginal sling N/A 11/30/2012    Procedure: Gaynelle Arabian;  Surgeon: Bernestine Amass, MD;  Location: [facility];  Service: Urology;  Laterality: N/A;  1 HR EXAM UNDER ANESTHESIA, EXCISION OF SUB URETHRAL MESH, CYSTO, HOD   . Cysto with hydrodistension N/A 11/30/2012    Procedure: CYSTOSCOPY/HYDRODISTENSION;  Surgeon: Bernestine Amass, MD;  Location[facility];  Service: Urology;  Laterality: N/A;  . Video bronchoscopy with endobronchial ultrasound N/A 09/21/2014  Procedure: VIDEO BRONCHOSCOPY WITH ENDOBRONCHIAL ULTRASOUND;  Surgeon: Collene Gobble, MD;  Location: [facility] OR;  Service: Thoracic;  Laterality: N/A;    Family History  Problem Relation Age of Onset  . Diabetes Mother   . Hypertension Mother   . Heart disease Mother   . Cancer Father     LUNG AND BRAIN  . Hypertension Father   . Heart disease Maternal Grandmother   . Cancer Maternal Grandfather     LUNG  . Heart disease Paternal Grandmother   . Cancer Paternal Grandfather     Social History History  Substance Use Topics  . Smoking status: Former Smoker -- 0.50 packs/day for 15 years    Types: Cigarettes    Quit date: 01/15/2014  . Smokeless tobacco: Never Used  . Alcohol Use: No    Current Outpatient  Prescriptions  Medication Sig Dispense Refill  . albuterol (PROVENTIL HFA;VENTOLIN HFA) 108 (90 BASE) MCG/ACT inhaler Inhale 2 puffs into the lungs every 6 (six) hours as needed. For shortness of breath 3.7 g 1  . diazepam (VALIUM) 2 MG tablet Take 2 mg by mouth at bedtime.    . dicyclomine (BENTYL) 20 MG tablet Take 20 mg by mouth every 6 (six) hours.    Marland Kitchen levothyroxine (SYNTHROID, LEVOTHROID) 137 MCG tablet Take 137 mcg by mouth every morning.     . meloxicam (MOBIC) 7.5 MG tablet Take 7.5 mg by mouth 2 (two) times daily as needed.  0  . naproxen sodium (ANAPROX) 220 MG tablet Take 220 mg by mouth 2 (two) times daily with a meal.    . omeprazole (PRILOSEC) 40 MG capsule Take 40 mg by mouth at bedtime.    Marland Kitchen PARoxetine (PAXIL) 20 MG tablet Take 20 mg by mouth at bedtime.    Marland Kitchen rOPINIRole (REQUIP) 2 MG tablet Take 2 mg by mouth at bedtime.    . traZODone (DESYREL) 50 MG tablet Take 50 mg by mouth at bedtime.    . valACYclovir (VALTREX) 500 MG tablet Take 500 mg by mouth at bedtime.     No current facility-administered medications for this visit.    Allergies  Allergen Reactions  . Bactrim [Sulfamethoxazole-Trimethoprim] Other (See Comments)    Fever   . Percocet [Oxycodone-Acetaminophen] Itching  . Vicodin [Hydrocodone-Acetaminophen] Itching    Review of Systems  Constitutional: Positive for chills, diaphoresis, fatigue and unexpected weight change (Has lost 30 pounds over 6 months, some intentional). Negative for fever.  Eyes: Positive for visual disturbance.  Respiratory: Positive for cough and shortness of breath.   Cardiovascular: Positive for chest pain and leg swelling.  Gastrointestinal: Positive for diarrhea and constipation.       Reflux with frequent heartburn  Genitourinary: Positive for frequency.  Neurological: Positive for headaches (Left frontal).  Hematological: Bruises/bleeds easily.  Psychiatric/Behavioral: The patient is nervous/anxious.   All other systems  reviewed and are negative.   BP 130/87 mmHg  Pulse 79  Resp 16  Ht 5\' 7"  (1.702 m)  Wt 168 lb (76.204 kg)  BMI 26.31 kg/m2  SpO2 97%  LMP 06/03/2012 Physical Exam  Constitutional: She is oriented to person, place, and time. She appears well-developed and well-nourished. She appears distressed.  HENT:  Head: Normocephalic and atraumatic.  Eyes: Pupils are equal, round, and reactive to light.  Neck: Neck supple. No thyromegaly present.  Cardiovascular: Normal rate, regular rhythm and normal heart sounds.  Exam reveals no friction rub.   No murmur heard. Pulmonary/Chest: Effort normal and breath sounds  normal. She has no wheezes. She has no rales.  Abdominal: Soft. There is no tenderness.  Musculoskeletal: She exhibits no edema.  Lymphadenopathy:    She has no cervical adenopathy.  Neurological: She is alert and oriented to person, place, and time. No cranial nerve deficit.  Skin: Skin is warm and dry.  Vitals reviewed.    Diagnostic Tests: CT CHEST, ABDOMEN, AND PELVIS WITH CONTRAST  TECHNIQUE: Multidetector CT imaging of the chest, abdomen and pelvis was performed following the standard protocol during bolus administration of intravenous contrast.  CONTRAST: 159mL OMNIPAQUE IOHEXOL 300 MG/ML SOLN  COMPARISON: CT abdomen pelvis, 05/24/2014. Previous day's chest radiograph.  FINDINGS: CT CHEST FINDINGS  There is mediastinal and bilateral hilar adenopathy. Several reference measurements were made. Nodes pending from the AP window to the prevascular space adjacent to the aortic arch measures 16 mm in short axis. Right peritracheal node approximately 3 cm above the chronic measures 15 mm in short axis. Right subcarinal node measures 2 cm in short axis. Right inferolateral hilar node measures 14 mm in short axis. Left infrahilar node measures 17 mm in short axis.  No neck base or axillary masses or adenopathy.  Heart normal in size and configuration. No  coronary artery calcifications. Great vessels are within normal limits.  Lungs are clear. No pleural effusion.  CT ABDOMEN AND PELVIS FINDINGS  There is upper abdominal adenopathy. Node just superior to the portal vein along the gastrohepatic ligament measures 16 mm in short axis. Node anterior to the portal vein measures 13 mm short axis. Node anterior to the aorta at the level of the right renal artery measures 9 mm in short axis. Several other prominent nodes are noted along the retroperitoneum adjacent to the aorta and inferior vena cava. No enlarged pelvic lymph nodes. No enlarged mesenteric lymph nodes.  Liver, spleen, gallbladder, pancreas, adrenal glands, kidneys, ureters, bladder: Normal.  Uterus surgically absent. No pelvic masses.  No ascites.  Colon and small bowel are unremarkable.  MUSCULOSKELETAL  No osteoblastic or osteolytic lesions. Status post posterior spinal fusion from L4 through S1. Orthopedic hardware well-seated and aligned.  IMPRESSION: 1. Mediastinal and hilar adenopathy as well of adenopathy in the upper abdomen most evident along the gastrohepatic ligament/porta hepatis. 2. No abnormality of the lungs. No interstitial lung disease. 3. No abdominal organ abnormality. 4. No lytic bone lesions seen on the included field of view. 5. Differential diagnosis for the adenopathy includes sarcoidosis which could cause the significant mediastinal and hilar adenopathy as well as the upper abdominal adenopathy, and can cause lytic bone lesions. Lymphoma is also in the differential diagnosis.   Electronically Signed  By: Lajean Manes M.D.  On: 09/19/2014 20:06  I personally reviewed the CT scan and base my recommendation on my reading of the above.   Impression: 49 year old woman with mediastinal and retroperitoneal adenopathy and a lytic skull lesion. Bronchoscopy and endobronchial ultrasound for sampling of the mediastinal lymph  nodes was nondiagnostic. She now is sent for mediastinal endoscopy for definitive diagnosis.  I discussed the proposed procedure, mediastinoscopy, with [name]. She understands that this is diagnostic and not therapeutic. I reviewed the general nature of the procedure, the need for general anesthesia, the incision to be used, and the expected recovery. I reviewed the indications, risks, benefits, and alternatives. She understands the risks include but are not limited to bleeding, possible need for transfusion, possible need for thoracotomy, recurrent nerve injury with hoarseness, pneumothorax, stroke, esophageal injury, as well as general risks  such as MI, DVT, and PE. She understands and accepts the risks and agrees to proceed. She wishes to proceed as soon as possible. The first available operating date is Friday 2/26. [birth year] is [birth year]. She says that she wants to go ahead and have the procedure done on that day.  Plan: Mediastinoscopy on Friday, 10/26/2014.  Revonda Standard Roxan Hockey, MD [facility] [phone]

## 2014-10-24 ENCOUNTER — Ambulatory Visit: Payer: BLUE CROSS/BLUE SHIELD | Admitting: Oncology

## 2014-10-24 ENCOUNTER — Other Ambulatory Visit: Payer: Self-pay | Admitting: *Deleted

## 2014-10-24 ENCOUNTER — Ambulatory Visit: Payer: BLUE CROSS/BLUE SHIELD

## 2014-10-24 DIAGNOSIS — R590 Localized enlarged lymph nodes: Secondary | ICD-10-CM

## 2014-10-25 ENCOUNTER — Encounter (HOSPITAL_COMMUNITY): Payer: Self-pay | Admitting: *Deleted

## 2014-10-25 MED ORDER — CHLORHEXIDINE GLUCONATE CLOTH 2 % EX PADS: 6.0000 | MEDICATED_PAD | Freq: Once | CUTANEOUS | Status: DC

## 2014-10-25 MED ORDER — MUPIROCIN 2 % EX OINT
1.0000 "application " | TOPICAL_OINTMENT | Freq: Once | CUTANEOUS | Status: AC
  Administered 2014-10-26: 1 via TOPICAL
  Filled 2014-10-25: qty 22

## 2014-10-25 MED ORDER — DEXTROSE 5 % IV SOLN
1.5000 g | INTRAVENOUS | Status: AC
  Administered 2014-10-26: 1.5 g via INTRAVENOUS
  Filled 2014-10-25: qty 1.5

## 2014-10-25 NOTE — Progress Notes (Signed)
[name] reports having a stress test last year at [facility] cardiology.  Patient was seen by cardiologist due to shortness of breath and family history.  Stress test was negative and she does not need to follow up with them.  I have requested stress test.

## 2014-10-26 ENCOUNTER — Ambulatory Visit (HOSPITAL_COMMUNITY): Payer: BLUE CROSS/BLUE SHIELD

## 2014-10-26 ENCOUNTER — Encounter (HOSPITAL_COMMUNITY)
Admission: RE | Disposition: A | Discharge status: Home / Self Care | Payer: Self-pay | Source: Ambulatory Visit | Attending: Thoracic Surgery (Cardiothoracic Vascular Surgery)

## 2014-10-26 ENCOUNTER — Ambulatory Visit (HOSPITAL_COMMUNITY): Payer: BLUE CROSS/BLUE SHIELD | Admitting: Anesthesiology

## 2014-10-26 ENCOUNTER — Encounter (HOSPITAL_COMMUNITY): Payer: Self-pay | Admitting: *Deleted

## 2014-10-26 ENCOUNTER — Ambulatory Visit (HOSPITAL_COMMUNITY)
Admission: RE | Admit: 2014-10-26 | Discharge: 2014-10-26 | Disposition: A | Discharge status: Home / Self Care | Payer: BLUE CROSS/BLUE SHIELD | Source: Ambulatory Visit | Attending: Thoracic Surgery (Cardiothoracic Vascular Surgery) | Admitting: Thoracic Surgery (Cardiothoracic Vascular Surgery)

## 2014-10-26 DIAGNOSIS — Z87891 Personal history of nicotine dependence: Secondary | ICD-10-CM | POA: Diagnosis not present

## 2014-10-26 DIAGNOSIS — G43909 Migraine, unspecified, not intractable, without status migrainosus: Secondary | ICD-10-CM | POA: Diagnosis not present

## 2014-10-26 DIAGNOSIS — M1990 Unspecified osteoarthritis, unspecified site: Secondary | ICD-10-CM | POA: Insufficient documentation

## 2014-10-26 DIAGNOSIS — R590 Localized enlarged lymph nodes: Secondary | ICD-10-CM | POA: Diagnosis not present

## 2014-10-26 DIAGNOSIS — G2581 Restless legs syndrome: Secondary | ICD-10-CM | POA: Insufficient documentation

## 2014-10-26 DIAGNOSIS — K589 Irritable bowel syndrome without diarrhea: Secondary | ICD-10-CM | POA: Insufficient documentation

## 2014-10-26 DIAGNOSIS — E039 Hypothyroidism, unspecified: Secondary | ICD-10-CM | POA: Diagnosis not present

## 2014-10-26 DIAGNOSIS — M549 Dorsalgia, unspecified: Secondary | ICD-10-CM | POA: Insufficient documentation

## 2014-10-26 DIAGNOSIS — Z79899 Other long term (current) drug therapy: Secondary | ICD-10-CM | POA: Diagnosis not present

## 2014-10-26 DIAGNOSIS — Z791 Long term (current) use of non-steroidal anti-inflammatories (NSAID): Secondary | ICD-10-CM | POA: Diagnosis not present

## 2014-10-26 DIAGNOSIS — K219 Gastro-esophageal reflux disease without esophagitis: Secondary | ICD-10-CM | POA: Diagnosis not present

## 2014-10-26 DIAGNOSIS — J45909 Unspecified asthma, uncomplicated: Secondary | ICD-10-CM | POA: Diagnosis not present

## 2014-10-26 DIAGNOSIS — F329 Major depressive disorder, single episode, unspecified: Secondary | ICD-10-CM | POA: Insufficient documentation

## 2014-10-26 DIAGNOSIS — G8929 Other chronic pain: Secondary | ICD-10-CM | POA: Diagnosis not present

## 2014-10-26 DIAGNOSIS — R569 Unspecified convulsions: Secondary | ICD-10-CM | POA: Insufficient documentation

## 2014-10-26 HISTORY — DX: Reserved for inherently not codable concepts without codable children: IMO0001

## 2014-10-26 HISTORY — PX: MEDIASTINOSCOPY: SHX5086

## 2014-10-26 LAB — CBC
HEMATOCRIT: 37.9 % (ref 36.0–46.0)
Hemoglobin: 12.7 g/dL (ref 12.0–15.0)
MCH: 28.9 pg (ref 26.0–34.0)
MCHC: 33.5 g/dL (ref 30.0–36.0)
MCV: 86.1 fL (ref 78.0–100.0)
Platelets: 192 10*3/uL (ref 150–400)
RBC: 4.4 MIL/uL (ref 3.87–5.11)
RDW: 14.4 % (ref 11.5–15.5)
WBC: 5.2 10*3/uL (ref 4.0–10.5)

## 2014-10-26 LAB — COMPREHENSIVE METABOLIC PANEL
ALT: 22 U/L (ref 0–35)
ANION GAP: 5 (ref 5–15)
AST: 23 U/L (ref 0–37)
Albumin: 3.9 g/dL (ref 3.5–5.2)
Alkaline Phosphatase: 86 U/L (ref 39–117)
BILIRUBIN TOTAL: 0.4 mg/dL (ref 0.3–1.2)
BUN: 19 mg/dL (ref 6–23)
CHLORIDE: 108 mmol/L (ref 96–112)
CO2: 24 mmol/L (ref 19–32)
CREATININE: 0.81 mg/dL (ref 0.50–1.10)
Calcium: 8.7 mg/dL (ref 8.4–10.5)
GFR calc Af Amer: >90 mL/min (ref >90)
GFR, EST NON AFRICAN AMERICAN: 85 mL/min — AB (ref >90)
Glucose, Bld: 93 mg/dL (ref 70–99)
POTASSIUM: 4.3 mmol/L (ref 3.5–5.1)
Sodium: 137 mmol/L (ref 135–145)
Total Protein: 6.3 g/dL (ref 6.0–8.3)

## 2014-10-26 LAB — PROTIME-INR
INR: 1.02 (ref 0.00–1.49)
PROTHROMBIN TIME: 13.5 s (ref 11.6–15.2)

## 2014-10-26 LAB — TYPE AND SCREEN
ABO/RH(D): O POS
Antibody Screen: NEGATIVE

## 2014-10-26 LAB — APTT: aPTT: 29 seconds (ref 24–37)

## 2014-10-26 LAB — SURGICAL PCR SCREEN
MRSA, PCR: NEGATIVE
Staphylococcus aureus: NEGATIVE

## 2014-10-26 SURGERY — MEDIASTINOSCOPY
Anesthesia: General | Site: Chest

## 2014-10-26 MED ORDER — HYDROMORPHONE HCL 2 MG PO TABS: 2.0000 mg | ORAL_TABLET | ORAL | Status: DC | PRN

## 2014-10-26 MED ORDER — LIDOCAINE HCL (CARDIAC) 20 MG/ML IV SOLN
INTRAVENOUS | Status: DC | PRN
  Administered 2014-10-26: 70 mg via INTRAVENOUS

## 2014-10-26 MED ORDER — LACTATED RINGERS IV SOLN
INTRAVENOUS | Status: DC
  Administered 2014-10-26: 12:00:00 via INTRAVENOUS

## 2014-10-26 MED ORDER — HYDROMORPHONE HCL 1 MG/ML IJ SOLN
INTRAMUSCULAR | Status: AC
  Filled 2014-10-26: qty 1

## 2014-10-26 MED ORDER — PROPOFOL 10 MG/ML IV BOLUS
INTRAVENOUS | Status: DC | PRN
  Administered 2014-10-26: 10 mg via INTRAVENOUS
  Administered 2014-10-26: 140 mg via INTRAVENOUS

## 2014-10-26 MED ORDER — FENTANYL CITRATE 0.05 MG/ML IJ SOLN
INTRAMUSCULAR | Status: DC | PRN
  Administered 2014-10-26: 100 ug via INTRAVENOUS
  Administered 2014-10-26: 50 ug via INTRAVENOUS
  Administered 2014-10-26: 100 ug via INTRAVENOUS

## 2014-10-26 MED ORDER — PHENYLEPHRINE HCL 10 MG/ML IJ SOLN
INTRAMUSCULAR | Status: DC | PRN
  Administered 2014-10-26: 40 ug via INTRAVENOUS
  Administered 2014-10-26: 120 ug via INTRAVENOUS
  Administered 2014-10-26: 80 ug via INTRAVENOUS
  Administered 2014-10-26: 40 ug via INTRAVENOUS

## 2014-10-26 MED ORDER — GLYCOPYRROLATE 0.2 MG/ML IJ SOLN
INTRAMUSCULAR | Status: AC
  Filled 2014-10-26: qty 3

## 2014-10-26 MED ORDER — ROCURONIUM BROMIDE 50 MG/5ML IV SOLN
INTRAVENOUS | Status: AC
  Filled 2014-10-26: qty 1

## 2014-10-26 MED ORDER — FENTANYL CITRATE 0.05 MG/ML IJ SOLN
INTRAMUSCULAR | Status: AC
Start: 2014-10-26 — End: 2014-10-26
  Filled 2014-10-26: qty 5

## 2014-10-26 MED ORDER — ONDANSETRON HCL 4 MG/2ML IJ SOLN
INTRAMUSCULAR | Status: AC
  Filled 2014-10-26: qty 2

## 2014-10-26 MED ORDER — NEOSTIGMINE METHYLSULFATE 10 MG/10ML IV SOLN
INTRAVENOUS | Status: DC | PRN
  Administered 2014-10-26: 4 mg via INTRAVENOUS

## 2014-10-26 MED ORDER — PHENYLEPHRINE 40 MCG/ML (10ML) SYRINGE FOR IV PUSH (FOR BLOOD PRESSURE SUPPORT)
PREFILLED_SYRINGE | INTRAVENOUS | Status: AC
  Filled 2014-10-26: qty 10

## 2014-10-26 MED ORDER — FENTANYL CITRATE 0.05 MG/ML IJ SOLN
INTRAMUSCULAR | Status: AC
  Filled 2014-10-26: qty 2

## 2014-10-26 MED ORDER — ACETAMINOPHEN 325 MG PO TABS
325.0000 mg | ORAL_TABLET | ORAL | Status: DC | PRN
Start: 2014-10-26 — End: 2014-10-26
  Administered 2014-10-26: 650 mg via ORAL

## 2014-10-26 MED ORDER — ROCURONIUM BROMIDE 100 MG/10ML IV SOLN
INTRAVENOUS | Status: DC | PRN
  Administered 2014-10-26: 40 mg via INTRAVENOUS
  Administered 2014-10-26: 10 mg via INTRAVENOUS

## 2014-10-26 MED ORDER — 0.9 % SODIUM CHLORIDE (POUR BTL) OPTIME
TOPICAL | Status: DC | PRN
  Administered 2014-10-26: 1000 mL

## 2014-10-26 MED ORDER — ONDANSETRON HCL 4 MG/2ML IJ SOLN
INTRAMUSCULAR | Status: DC | PRN
  Administered 2014-10-26: 4 mg via INTRAVENOUS

## 2014-10-26 MED ORDER — LIDOCAINE HCL (CARDIAC) 20 MG/ML IV SOLN
INTRAVENOUS | Status: AC
  Filled 2014-10-26: qty 5

## 2014-10-26 MED ORDER — MIDAZOLAM HCL 2 MG/2ML IJ SOLN
INTRAMUSCULAR | Status: AC
  Filled 2014-10-26: qty 2

## 2014-10-26 MED ORDER — HYDROMORPHONE HCL 2 MG PO TABS
ORAL_TABLET | ORAL | Status: DC
Start: 2014-10-26 — End: 2014-10-26
  Filled 2014-10-26: qty 1

## 2014-10-26 MED ORDER — GLYCOPYRROLATE 0.2 MG/ML IJ SOLN
INTRAMUSCULAR | Status: DC | PRN
  Administered 2014-10-26: 0.6 mg via INTRAVENOUS

## 2014-10-26 MED ORDER — ACETAMINOPHEN 160 MG/5ML PO SOLN
325.0000 mg | ORAL | Status: DC | PRN
  Filled 2014-10-26: qty 20.3

## 2014-10-26 MED ORDER — HYDROMORPHONE HCL 1 MG/ML IJ SOLN
0.2500 mg | INTRAMUSCULAR | Status: DC | PRN
  Administered 2014-10-26: 1 mg via INTRAVENOUS

## 2014-10-26 MED ORDER — PROPOFOL 10 MG/ML IV BOLUS
INTRAVENOUS | Status: AC
  Filled 2014-10-26: qty 20

## 2014-10-26 MED ORDER — HYDROMORPHONE HCL 2 MG PO TABS
2.0000 mg | ORAL_TABLET | ORAL | Status: DC | PRN
  Administered 2014-10-26: 2 mg via ORAL
  Filled 2014-10-26: qty 1

## 2014-10-26 MED ORDER — MIDAZOLAM HCL 5 MG/5ML IJ SOLN
INTRAMUSCULAR | Status: DC | PRN
  Administered 2014-10-26: 2 mg via INTRAVENOUS

## 2014-10-26 MED ORDER — FENTANYL CITRATE 0.05 MG/ML IJ SOLN
25.0000 ug | INTRAMUSCULAR | Status: DC | PRN
  Administered 2014-10-26 (×2): 50 ug via INTRAVENOUS

## 2014-10-26 MED ORDER — NEOSTIGMINE METHYLSULFATE 10 MG/10ML IV SOLN
INTRAVENOUS | Status: AC
  Filled 2014-10-26: qty 1

## 2014-10-26 MED ORDER — ARTIFICIAL TEARS OP OINT
TOPICAL_OINTMENT | OPHTHALMIC | Status: DC | PRN
  Administered 2014-10-26: 1 via OPHTHALMIC

## 2014-10-26 MED ORDER — ACETAMINOPHEN 325 MG PO TABS
ORAL_TABLET | ORAL | Status: AC
  Filled 2014-10-26: qty 2

## 2014-10-26 MED ORDER — ARTIFICIAL TEARS OP OINT
TOPICAL_OINTMENT | OPHTHALMIC | Status: AC
  Filled 2014-10-26: qty 3.5

## 2014-10-26 SURGICAL SUPPLY — 49 items
APPLIER CLIP LOGIC TI 5 (MISCELLANEOUS) IMPLANT
APR CLP MED LRG 33X5 (MISCELLANEOUS)
BLADE SURG 10 STRL SS (BLADE) ×2 IMPLANT
BLADE SURG 15 STRL LF DISP TIS (BLADE) ×1 IMPLANT
BLADE SURG 15 STRL SS (BLADE) ×2
CANISTER SUCTION 2500CC (MISCELLANEOUS) ×2 IMPLANT
CLIP TI MEDIUM 6 (CLIP) ×2 IMPLANT
CONT SPEC 4OZ CLIKSEAL STRL BL (MISCELLANEOUS) ×16 IMPLANT
COVER SURGICAL LIGHT HANDLE (MISCELLANEOUS) ×4 IMPLANT
DERMABOND ADVANCED (GAUZE/BANDAGES/DRESSINGS) ×1
DERMABOND ADVANCED .7 DNX12 (GAUZE/BANDAGES/DRESSINGS) ×1 IMPLANT
DRAPE CHEST BREAST 15X10 FENES (DRAPES) ×2 IMPLANT
ELECT REM PT RETURN 9FT ADLT (ELECTROSURGICAL) ×2
ELECTRODE REM PT RTRN 9FT ADLT (ELECTROSURGICAL) ×1 IMPLANT
GAUZE SPONGE 4X4 12PLY STRL (GAUZE/BANDAGES/DRESSINGS) IMPLANT
GAUZE SPONGE 4X4 16PLY XRAY LF (GAUZE/BANDAGES/DRESSINGS) ×2 IMPLANT
GLOVE BIOGEL PI IND STRL 6.5 (GLOVE) ×1 IMPLANT
GLOVE BIOGEL PI INDICATOR 6.5 (GLOVE) ×1
GLOVE SURG SIGNA 7.5 PF LTX (GLOVE) ×2 IMPLANT
GLOVE SURG SS PI 7.0 STRL IVOR (GLOVE) ×4 IMPLANT
GOWN STRL REUS W/ TWL LRG LVL3 (GOWN DISPOSABLE) IMPLANT
GOWN STRL REUS W/ TWL XL LVL3 (GOWN DISPOSABLE) ×3 IMPLANT
GOWN STRL REUS W/TWL LRG LVL3 (GOWN DISPOSABLE)
GOWN STRL REUS W/TWL XL LVL3 (GOWN DISPOSABLE) ×6
HEMOSTAT SURGICEL 2X14 (HEMOSTASIS) IMPLANT
KIT BASIN OR (CUSTOM PROCEDURE TRAY) ×2 IMPLANT
KIT ROOM TURNOVER OR (KITS) ×2 IMPLANT
LIQUID BAND (GAUZE/BANDAGES/DRESSINGS) ×2 IMPLANT
NS IRRIG 1000ML POUR BTL (IV SOLUTION) ×2 IMPLANT
PACK SURGICAL SETUP 50X90 (CUSTOM PROCEDURE TRAY) ×2 IMPLANT
PAD ARMBOARD 7.5X6 YLW CONV (MISCELLANEOUS) ×4 IMPLANT
PENCIL BUTTON HOLSTER BLD 10FT (ELECTRODE) ×2 IMPLANT
SPONGE INTESTINAL PEANUT (DISPOSABLE) ×2 IMPLANT
SUT SILK 2 0 TIES 10X30 (SUTURE) IMPLANT
SUT SILK 3 0 (SUTURE) ×1
SUT SILK 3-0 18XBRD TIE 12 (SUTURE) ×1 IMPLANT
SUT VIC AB 2-0 CT1 27 (SUTURE) ×1
SUT VIC AB 2-0 CT1 TAPERPNT 27 (SUTURE) ×1 IMPLANT
SUT VIC AB 3-0 SH 18 (SUTURE) IMPLANT
SUT VIC AB 3-0 SH 27 (SUTURE) ×1
SUT VIC AB 3-0 SH 27X BRD (SUTURE) ×1 IMPLANT
SUT VICRYL 4-0 PS2 18IN ABS (SUTURE) ×2 IMPLANT
SWAB COLLECTION DEVICE MRSA (MISCELLANEOUS) IMPLANT
SYRINGE 10CC LL (SYRINGE) ×2 IMPLANT
TOWEL OR 17X24 6PK STRL BLUE (TOWEL DISPOSABLE) ×2 IMPLANT
TOWEL OR 17X26 10 PK STRL BLUE (TOWEL DISPOSABLE) ×2 IMPLANT
TUBE ANAEROBIC SPECIMEN COL (MISCELLANEOUS) IMPLANT
TUBE CONNECTING 12X1/4 (SUCTIONS) ×2 IMPLANT
WATER STERILE IRR 1000ML POUR (IV SOLUTION) ×2 IMPLANT

## 2014-10-26 NOTE — Anesthesia Postprocedure Evaluation (Signed)
  Anesthesia Post-op Note  Patient: [name]  Procedure(s) Performed: Procedure(s): MEDIASTINOSCOPY (N/A)  Patient Location: PACU  Anesthesia Type:General  Level of Consciousness: awake  Airway and Oxygen Therapy: Patient Spontanous Breathing  Post-op Pain: mild  Post-op Assessment: Post-op Vital signs reviewed, Patient's Cardiovascular Status Stable, Respiratory Function Stable, Patent Airway, No signs of Nausea or vomiting and Pain level controlled  Post-op Vital Signs: Reviewed and stable  Last Vitals:  Filed Vitals:   10/26/14 1648  BP:   Pulse: 79  Temp:   Resp: 18    Complications: No apparent anesthesia complications

## 2014-10-26 NOTE — Brief Op Note (Signed)
10/26/2014  2:47 PM  PATIENT:  [name]  49 y.o. female  PRE-OPERATIVE DIAGNOSIS:  MEDIASTINAL ADENOPATHY  POST-OPERATIVE DIAGNOSIS:  MEDIASTINAL ADENOPATHY  PROCEDURE:  Procedure(s): MEDIASTINOSCOPY (N/A)  SURGEON:  Surgeon(s) and Role:    * Melrose Nakayama, MD - Primary  PHYSICIAN ASSISTANT:   ASSISTANTS: none   ANESTHESIA:   general  EBL:  Total I/O In: 500 [I.V.:500] Out: -   BLOOD ADMINISTERED:none  DRAINS: none   LOCAL MEDICATIONS USED:  NONE  SPECIMEN:  Source of Specimen:  lymph nodes  DISPOSITION OF SPECIMEN:  Path and micro  COUNTS:  YES  PLAN OF CARE: Discharge to home after PACU  PATIENT DISPOSITION:  PACU - hemodynamically stable.   Delay start of Pharmacological VTE agent (>24hrs) due to surgical blood loss or risk of bleeding: not applicable\  FROZEN= Granulomatous disease

## 2014-10-26 NOTE — Transfer of Care (Signed)
Immediate Anesthesia Transfer of Care Note  Patient: [name]  Procedure(s) Performed: Procedure(s): MEDIASTINOSCOPY (N/A)  Patient Location: PACU  Anesthesia Type:General  Level of Consciousness: awake, alert  and oriented  Airway & Oxygen Therapy: Patient Spontanous Breathing and Patient connected to nasal cannula oxygen  Post-op Assessment: Report given to RN  Post vital signs: Reviewed and stable  Last Vitals:  Filed Vitals:   10/26/14 1112  BP: 138/73  Pulse: 72  Temp: 36.8 C  Resp: 16    Complications: No apparent anesthesia complications

## 2014-10-26 NOTE — Discharge Instructions (Addendum)
Do not drive or engage in heavy physical activity for 24 hours.  After that you may drive once you are no longer taking the prescription pain medication  My office will contact you with follow up information  Call [phone] if you develop shortness of breath, fever > 101 or redness around or drainage from your incision  There is a medical adhesive on your incision it will begin to peel of in 7-10 days.  What to eat:  For your first meals, you should eat lightly; only small meals initially.  If you do not have nausea, you may eat larger meals.  Avoid spicy, greasy and heavy food.    General Anesthesia, Adult, Care After  Refer to this sheet in the next few weeks. These instructions provide you with information on caring for yourself after your procedure. Your health care provider may also give you more specific instructions. Your treatment has been planned according to current medical practices, but problems sometimes occur. Call your health care provider if you have any problems or questions after your procedure.  WHAT TO EXPECT AFTER THE PROCEDURE  After the procedure, it is typical to experience:  Sleepiness.  Nausea and vomiting. HOME CARE INSTRUCTIONS  For the first 24 hours after general anesthesia:  Have a responsible person with you.  Do not drive a car. If you are alone, do not take public transportation.  Do not drink alcohol.  Do not take medicine that has not been prescribed by your health care provider.  Do not sign important papers or make important decisions.  You may resume a normal diet and activities as directed by your health care provider.  Change bandages (dressings) as directed.  If you have questions or problems that seem related to general anesthesia, call the hospital and ask for the anesthetist or anesthesiologist on call. SEEK MEDICAL CARE IF:  You have nausea and vomiting that continue the day after anesthesia.  You develop a rash. SEEK IMMEDIATE MEDICAL  CARE IF:  You have difficulty breathing.  You have chest pain.  You have any allergic problems. Document Released: 11/23/2000 Document Revised: 04/19/2013 Document Reviewed: 03/02/2013  [facility] Patient Information 2014 [place], [place].   Sore Throat    A sore throat is a painful, burning, sore, or scratchy feeling of the throat. There may be pain or tenderness when swallowing or talking. You may have other symptoms with a sore throat. These include coughing, sneezing, fever, or a swollen neck. A sore throat is often the first sign of another sickness. These sicknesses may include a cold, flu, strep throat, or an infection called mono. Most sore throats go away without medical treatment.  HOME CARE  Only take medicine as told by your doctor.  Drink enough fluids to keep your pee (urine) clear or pale yellow.  Rest as needed.  Try using throat sprays, lozenges, or suck on hard candy (if older than 4 years or as told).  Sip warm liquids, such as broth, herbal tea, or warm water with honey. Try sucking on frozen ice pops or drinking cold liquids.  Rinse the mouth (gargle) with salt water. Mix 1 teaspoon salt with 8 ounces of water.  Do not smoke. Avoid being around others when they are smoking.  Put a humidifier in your bedroom at night to moisten the air. You can also turn on a hot shower and sit in the bathroom for 5-10 minutes. Be sure the bathroom door is closed. GET HELP RIGHT AWAY IF:  You have trouble breathing.  You cannot swallow fluids, soft foods, or your spit (saliva).  You have more puffiness (swelling) in the throat.  Your sore throat does not get better in 7 days.  You feel sick to your stomach (nauseous) and throw up (vomit).  You have a fever or lasting symptoms for more than 2-3 days.  You have a fever and your symptoms suddenly get worse. MAKE SURE YOU:  Understand these instructions.  Will watch your condition.  Will get help right away if you are not doing well or get  worse. Document Released: 05/26/2008 Document Revised: 05/11/2012 Document Reviewed: 04/24/2012  [facility] Patient Information 2015 [place], [place]. This information is not intended to replace advice given to you by your health care provider. Make sure you discuss any questions you have with your health care provider.

## 2014-10-26 NOTE — Anesthesia Procedure Notes (Signed)
Procedure Name: Intubation Date/Time: 10/26/2014 1:34 PM Performed by: Sampson Si E Pre-anesthesia Checklist: Patient identified, Emergency Drugs available, Suction available, Patient being monitored and Timeout performed Patient Re-evaluated:Patient Re-evaluated prior to inductionOxygen Delivery Method: Circle system utilized Preoxygenation: Pre-oxygenation with 100% oxygen Intubation Type: IV induction Ventilation: Mask ventilation without difficulty Laryngoscope Size: Mac and 3 Grade View: Grade I Tube type: Oral Tube size: 7.5 mm Number of attempts: 1 Airway Equipment and Method: Stylet Placement Confirmation: ETT inserted through vocal cords under direct vision,  positive ETCO2 and breath sounds checked- equal and bilateral Secured at: 22 cm Tube secured with: Tape Dental Injury: Teeth and Oropharynx as per pre-operative assessment

## 2014-10-26 NOTE — Interval H&P Note (Signed)
History and Physical Interval Note:  10/26/2014 12:40 PM  [name]  has presented today for surgery, with the diagnosis of MEDIASTINAL ADENOPATHY  The various methods of treatment have been discussed with the patient and family. After consideration of risks, benefits and other options for treatment, the patient has consented to  Procedure(s): MEDIASTINOSCOPY (N/A) as a surgical intervention .  The patient's history has been reviewed, patient examined, no change in status, stable for surgery.  I have reviewed the patient's chart and labs.  Questions were answered to the patient's satisfaction.     , C

## 2014-10-26 NOTE — Anesthesia Preprocedure Evaluation (Addendum)
Anesthesia Evaluation  Patient identified by MRN, date of birth, ID band  Reviewed: Allergy & Precautions, NPO status , Patient's Chart, lab work & pertinent test results  Airway Mallampati: II  TM Distance: >3 FB Neck ROM: Full    Dental  (+) Teeth Intact, Dental Advisory Given, Chipped,    Pulmonary shortness of breath and with exertion, asthma , neg sleep apnea, former smoker,  breath sounds clear to auscultation        Cardiovascular negative cardio ROS  Rhythm:Regular Rate:Normal     Neuro/Psych  Headaches, Seizures -, Well Controlled,  PSYCHIATRIC DISORDERS Anxiety Depression    GI/Hepatic Neg liver ROS, GERD-  Medicated and Controlled,  Endo/Other  Hypothyroidism   Renal/GU negative Renal ROS     Musculoskeletal   Abdominal   Peds  Hematology   Anesthesia Other Findings   Reproductive/Obstetrics                           Anesthesia Physical Anesthesia Plan  ASA: III  Anesthesia Plan: General   Post-op Pain Management:    Induction: Intravenous  Airway Management Planned: Oral ETT  Additional Equipment: None  Intra-op Plan:   Post-operative Plan: Extubation in OR  Informed Consent: I have reviewed the patients History and Physical, chart, labs and discussed the procedure including the risks, benefits and alternatives for the proposed anesthesia with the patient or authorized representative who has indicated his/her understanding and acceptance.   Dental advisory given  Plan Discussed with: CRNA and Surgeon  Anesthesia Plan Comments:         Anesthesia Quick Evaluation

## 2014-10-26 NOTE — H&P (View-Only) (Signed)
PCP is WEBB, Valla Leaver, MD Referring Provider is Parrett, Fonnie Mu, NP  Chief Complaint  Patient presents with  . Adenopathy    mediastinal, hilar, abdominal and lytic skull lesion...CT C/A/P 09/19/14.Marland Kitchenassess for MEDIASTINSCOPY    HPI: 49 year old woman sent for consultation regarding mediastinal adenopathy.  [name] is a 49 year old woman with a complex medical history. [name] was admitted in January after presenting with "strokelike" symptoms. [name] had numbness of the left side of her face, transient blindness in her left eye and left sided weakness. During her workup [name] had a CT and MRI of the brain. Those showed a lytic skull lesion. A chest x-ray showed a thickened paratracheal stripe. That led to a CT of the chest abdomen and pelvis. It showed diffuse adenopathy.  [name] says that [name] was diagnosed with pericarditis and continues to have pain despite nonsteroidal anti-inflammatories. [name] denies fevers but says [name] has had chills and sweats. [name]'s had a persistent dry cough. [name] does experience pleuritic pain with coughing. [name] has lost 28 pounds over the past 6 months. However [name] says that much of that is intentional. [name]'s lost only 3 pounds over the past 3 months. 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   Past Medical History  Diagnosis Date  . IC (interstitial cystitis)   . RLS (restless legs syndrome)   . GERD (gastroesophageal reflux disease)   . Depression   . Hypothyroidism   . Environmental allergies   . IBS (irritable bowel syndrome)   . Frequency of urination   . Nocturia   . SUI (stress urinary incontinence, female)   . Erosion of suburethral sling   . Herpes simplex type 2 infection   . NSVD (normal spontaneous vaginal delivery)     x3  . Asthma   . Heart murmur     "when I was a baby"  . Chronic bronchitis     "get it close to q yr" (09/18/2014)  . Migraine     "maybe once/month" (09/18/2014)  . Headache     "at least  several times/wk" (09/18/2014)  . Seizures     "when I was a teenager"  . Arthritis     "back, hands, neck" (09/18/2014)  . Chronic back pain     "whole spine"  . Anxiety     Past Surgical History  Procedure Laterality Date  . Cesarean section  2000  . Pelvic laparoscopy  1990's    LYSIS ADHESIONS  . Tonsillectomy  1986  . Appendectomy  1985  . Anterior cervical decomp/discectomy fusion  2011  . Abdominal hysterectomy  2006  . Knee arthroscopy Right   . Lumbar fusion  12-04-2008    L4  -- S1  . Lumbar disc surgery  02-02-2007    RIGHT SIDE L5 -- S1  . Lynx retropubic suburethral sling  03-02-2007  . Back surgery  2008  . Tubal ligation  2001    hulka clip  . Pubovaginal sling N/A 11/30/2012    Procedure: Gaynelle Arabian;  Surgeon: Bernestine Amass, MD;  Location: [facility];  Service: Urology;  Laterality: N/A;  1 HR EXAM UNDER ANESTHESIA, EXCISION OF SUB URETHRAL MESH, CYSTO, HOD   . Cysto with hydrodistension N/A 11/30/2012    Procedure: CYSTOSCOPY/HYDRODISTENSION;  Surgeon: Bernestine Amass, MD;  Location: [facility];  Service: Urology;  Laterality: N/A;  . Video bronchoscopy with endobronchial ultrasound N/A 09/21/2014  Procedure: VIDEO BRONCHOSCOPY WITH ENDOBRONCHIAL ULTRASOUND;  Surgeon: Collene Gobble, MD;  Location: [facility] OR;  Service: Thoracic;  Laterality: N/A;    Family History  Problem Relation Age of Onset  . Diabetes Mother   . Hypertension Mother   . Heart disease Mother   . Cancer Father     LUNG AND BRAIN  . Hypertension Father   . Heart disease Maternal Grandmother   . Cancer Maternal Grandfather     LUNG  . Heart disease Paternal Grandmother   . Cancer Paternal Grandfather     Social History History  Substance Use Topics  . Smoking status: Former Smoker -- 0.50 packs/day for 15 years    Types: Cigarettes    Quit date: 01/15/2014  . Smokeless tobacco: Never Used  . Alcohol Use: No    Current Outpatient  Prescriptions  Medication Sig Dispense Refill  . albuterol (PROVENTIL HFA;VENTOLIN HFA) 108 (90 BASE) MCG/ACT inhaler Inhale 2 puffs into the lungs every 6 (six) hours as needed. For shortness of breath 3.7 g 1  . diazepam (VALIUM) 2 MG tablet Take 2 mg by mouth at bedtime.    . dicyclomine (BENTYL) 20 MG tablet Take 20 mg by mouth every 6 (six) hours.    Marland Kitchen levothyroxine (SYNTHROID, LEVOTHROID) 137 MCG tablet Take 137 mcg by mouth every morning.     . meloxicam (MOBIC) 7.5 MG tablet Take 7.5 mg by mouth 2 (two) times daily as needed.  0  . naproxen sodium (ANAPROX) 220 MG tablet Take 220 mg by mouth 2 (two) times daily with a meal.    . omeprazole (PRILOSEC) 40 MG capsule Take 40 mg by mouth at bedtime.    Marland Kitchen PARoxetine (PAXIL) 20 MG tablet Take 20 mg by mouth at bedtime.    Marland Kitchen rOPINIRole (REQUIP) 2 MG tablet Take 2 mg by mouth at bedtime.    . traZODone (DESYREL) 50 MG tablet Take 50 mg by mouth at bedtime.    . valACYclovir (VALTREX) 500 MG tablet Take 500 mg by mouth at bedtime.     No current facility-administered medications for this visit.    Allergies  Allergen Reactions  . Bactrim [Sulfamethoxazole-Trimethoprim] Other (See Comments)    Fever   . Percocet [Oxycodone-Acetaminophen] Itching  . Vicodin [Hydrocodone-Acetaminophen] Itching    Review of Systems  Constitutional: Positive for chills, diaphoresis, fatigue and unexpected weight change (Has lost 30 pounds over 6 months, some intentional). Negative for fever.  Eyes: Positive for visual disturbance.  Respiratory: Positive for cough and shortness of breath.   Cardiovascular: Positive for chest pain and leg swelling.  Gastrointestinal: Positive for diarrhea and constipation.       Reflux with frequent heartburn  Genitourinary: Positive for frequency.  Neurological: Positive for headaches (Left frontal).  Hematological: Bruises/bleeds easily.  Psychiatric/Behavioral: The patient is nervous/anxious.   All other systems  reviewed and are negative.   BP 130/87 mmHg  Pulse 79  Resp 16  Ht 5\' 7"  (1.702 m)  Wt 168 lb (76.204 kg)  BMI 26.31 kg/m2  SpO2 97%  LMP 06/03/2012 Physical Exam  Constitutional: [name] is oriented to person, place, and time. [name] appears well-developed and well-nourished. [name] appears distressed.  HENT:  Head: Normocephalic and atraumatic.  Eyes: Pupils are equal, round, and reactive to light.  Neck: Neck supple. No thyromegaly present.  Cardiovascular: Normal rate, regular rhythm and normal heart sounds.  Exam reveals no friction rub.   No murmur heard. Pulmonary/Chest: Effort normal and breath sounds  normal. [name] has no wheezes. [name] has no rales.  Abdominal: Soft. There is no tenderness.  Musculoskeletal: [name] exhibits no edema.  Lymphadenopathy:    [name] has no cervical adenopathy.  Neurological: [name] is alert and oriented to person, place, and time. No cranial nerve deficit.  Skin: Skin is warm and dry.  Vitals reviewed.    Diagnostic Tests: CT CHEST, ABDOMEN, AND PELVIS WITH CONTRAST  TECHNIQUE: Multidetector CT imaging of the chest, abdomen and pelvis was performed following the standard protocol during bolus administration of intravenous contrast.  CONTRAST: 157mL OMNIPAQUE IOHEXOL 300 MG/ML SOLN  COMPARISON: CT abdomen pelvis, 05/24/2014. Previous day's chest radiograph.  FINDINGS: CT CHEST FINDINGS  There is mediastinal and bilateral hilar adenopathy. Several reference measurements were made. Nodes pending from the AP window to the prevascular space adjacent to the aortic arch measures 16 mm in short axis. Right peritracheal node approximately 3 cm above the chronic measures 15 mm in short axis. Right subcarinal node measures 2 cm in short axis. Right inferolateral hilar node measures 14 mm in short axis. Left infrahilar node measures 17 mm in short axis.  No neck base or axillary masses or adenopathy.  Heart normal in size and configuration. No  coronary artery calcifications. Great vessels are within normal limits.  Lungs are clear. No pleural effusion.  CT ABDOMEN AND PELVIS FINDINGS  There is upper abdominal adenopathy. Node just superior to the portal vein along the gastrohepatic ligament measures 16 mm in short axis. Node anterior to the portal vein measures 13 mm short axis. Node anterior to the aorta at the level of the right renal artery measures 9 mm in short axis. Several other prominent nodes are noted along the retroperitoneum adjacent to the aorta and inferior vena cava. No enlarged pelvic lymph nodes. No enlarged mesenteric lymph nodes.  Liver, spleen, gallbladder, pancreas, adrenal glands, kidneys, ureters, bladder: Normal.  Uterus surgically absent. No pelvic masses.  No ascites.  Colon and small bowel are unremarkable.  MUSCULOSKELETAL  No osteoblastic or osteolytic lesions. Status post posterior spinal fusion from L4 through S1. Orthopedic hardware well-seated and aligned.  IMPRESSION: 1. Mediastinal and hilar adenopathy as well of adenopathy in the upper abdomen most evident along the gastrohepatic ligament/porta hepatis. 2. No abnormality of the lungs. No interstitial lung disease. 3. No abdominal organ abnormality. 4. No lytic bone lesions seen on the included field of view. 5. Differential diagnosis for the adenopathy includes sarcoidosis which could cause the significant mediastinal and hilar adenopathy as well as the upper abdominal adenopathy, and can cause lytic bone lesions. Lymphoma is also in the differential diagnosis.   Electronically Signed  By: Lajean Manes M.D.  On: 09/19/2014 20:06  I personally reviewed the CT scan and base my recommendation on my reading of the above.   Impression: 49 year old woman with mediastinal and retroperitoneal adenopathy and a lytic skull lesion. Bronchoscopy and endobronchial ultrasound for sampling of the mediastinal lymph  nodes was nondiagnostic. [name] now is sent for mediastinal endoscopy for definitive diagnosis.  I discussed the proposed procedure, mediastinoscopy, with [name]. [name] understands that this is diagnostic and not therapeutic. I reviewed the general nature of the procedure, the need for general anesthesia, the incision to be used, and the expected recovery. I reviewed the indications, risks, benefits, and alternatives. [name] understands the risks include but are not limited to bleeding, possible need for transfusion, possible need for thoracotomy, recurrent nerve injury with hoarseness, pneumothorax, stroke, esophageal injury, as well as general risks  such as MI, DVT, and PE. [name] understands and accepts the risks and agrees to proceed. [name] wishes to proceed as soon as possible. The first available operating date is Friday 2/26. [birth year] is her birthday. [name] says that [name] wants to go ahead and have the procedure done on that day.  Plan: Mediastinoscopy on Friday, 10/26/2014.  Revonda Standard Roxan Hockey, MD [facility] [phone]

## 2014-10-27 NOTE — Op Note (Signed)
[name], [name]              ACCOUNT NO.:  [id number]  MEDICAL RECORD NO.:  [id number]  LOCATION:  [facility]                         FACILITY:  [facility]  PHYSICIAN:  Revonda Standard. Roxan Hockey, M.D.DATE OF BIRTH:  1966/02/26  DATE OF PROCEDURE:  10/26/2014 DATE OF DISCHARGE:  10/26/2014                              OPERATIVE REPORT   PREOPERATIVE DIAGNOSIS:  Mediastinal adenopathy.  POSTOPERATIVE DIAGNOSIS:  Mediastinal adenopathy.  PROCEDURE:  Mediastinoscopy.  SURGEON:  Revonda Standard. Roxan Hockey, MD  ASSISTANT:  None.  ANESTHESIA:  General.  FINDINGS:  Multiple enlarged nodes. Frozen section revealed granulomatous disease.  CLINICAL NOTE:  [name] is a 49 year old woman who presented in January with a conversion reaction manifested by left-sided weakness and numbness on the left side of her face.  During her workup, she was found to have diffuse adenopathy.  A bronchoscopy and an endobronchial ultrasound were done but was nondiagnostic.  She now is referred for mediastinoscopy for diagnostic purposes.  The indications, risks, benefits, and alternatives were discussed in detail with the patient. She understood this was a diagnostic and not a therapeutic procedure. She accepted the risks and agreed to proceed.  OPERATIVE NOTE:  [name] was brought to the operating room on October 26, 2014.  She had induction of general anesthesia and was intubated.  Intravenous antibiotics were administered.  Sequential compression devices were placed on the calves for DVT prophylaxis.  The neck and chest were prepped and draped in usual sterile fashion.  A transverse incision was made 1 fingerbreadth above the sternal notch. This was carried through the skin and subcutaneous tissue.  The strap muscles were separated in the midline.  The pretracheal fascia was identified and incised.  A small cervical node was present overlying the pretracheal fascia.  This was excised with electrocautery  and sent for permanent pathology.  After incising the pretracheal fascia, the pretracheal plane was developed bluntly into the mediastinum.  The mediastinoscope was inserted, and systematic inspection of the mediastinal node stations was carried out.  There were multiple 2R nodes that were visible, the largest was biopsied and a portion of the specimen was sent for frozen section.  An additional portion was sent for AFB and fungal cultures.  The remainders were sent for permanent pathology.  A second 2R node then was visible.  It likewise was biopsied and was sent for permanent pathology.  The scope was inserted further.  A large granular 4R node was identified.  Multiple biopsies were taken of this node and sent for pathology.  While withdrawing the scope, a third 2R node was identified and this node was removed completely and sent for pathology.  Hemostasis was achieved with electrocautery.  The wound was packed with gauze after removing the mediastinoscope.  After 5 minutes, the packing was removed.  The mediastinoscope was reinserted.  Additional hemostasis was achieved. The wound was repacked.  The frozen section returned showing granulomatous disease.  The packing was removed, and a final inspection was made for hemostasis.  The mediastinoscope was then withdrawn.  The incision was closed in 2 layers with a 2-0 Vicryl subcutaneous closure, followed by a 4-0 Vicryl subcuticular suture.  Dermabond was applied. The  patient was extubated in the operating room, taken to postanesthetic care unit in good condition.     Revonda Standard Roxan Hockey, M.D.     SCH/MEDQ  D:  10/26/2014  T:  10/27/2014  Job:  [id number]

## 2014-10-29 ENCOUNTER — Encounter (HOSPITAL_COMMUNITY): Payer: Self-pay | Admitting: Thoracic Surgery (Cardiothoracic Vascular Surgery)

## 2014-11-04 LAB — AFB CULTURE WITH SMEAR (NOT AT ARMC): Acid Fast Smear: NONE SEEN

## 2014-11-12 ENCOUNTER — Encounter: Payer: Self-pay | Admitting: Pulmonary Disease

## 2014-11-12 ENCOUNTER — Ambulatory Visit (INDEPENDENT_AMBULATORY_CARE_PROVIDER_SITE_OTHER): Payer: BLUE CROSS/BLUE SHIELD | Admitting: Pulmonary Disease

## 2014-11-12 VITALS — BP 118/80 | HR 84 | Ht 65.0 in | Wt 175.0 lb

## 2014-11-12 DIAGNOSIS — D869 Sarcoidosis, unspecified: Secondary | ICD-10-CM | POA: Insufficient documentation

## 2014-11-12 MED ORDER — PREDNISONE 20 MG PO TABS: 40.0000 mg | ORAL_TABLET | Freq: Every day | ORAL | Status: DC

## 2014-11-12 NOTE — Patient Instructions (Signed)
Prednisone 20 mg pill >> take pills daily with breakfast Follow up in 2 to 3 weeks with Dr. Halford Chessman or Tammy Parrett

## 2014-11-12 NOTE — Progress Notes (Signed)
Chief Complaint  Patient presents with  . Follow-up    Pt here for f/u mediastinal procedure.Pt c/o dry cough,,chest tightness and sob. Pt denies wheezing.    History of Present Illness: [name] is a 49 y.o. female with sarcoidosis.  She is here to review mediastinoscopy results.  This showed granulomas, and no evidence for malignancy.  She continues to have headache, low grade temperature, cough, and and chest pain.  She also has been getting joint pains in her hands.  She is set up for referral to rheumatology.   TESTS: 09/18/14 CT head >> 12 x 6 mm Lt frontal lytic lesion 09/18/14 MRI brain >> 12 mm Lt frontal lytic lesion 09/18/14 Bone scan >> no other lesions 09/19/14 CT chest >> Rt paratracheal LAN 1.5 cm, Rt subcarinal LAN 2 cm, Rt hilar LAN 1.4 cm, Lt hilar LAN 1.7 cm 09/19/14 CT abd/pelvis >> 1.6 cm LAN at gastrohepatic ligament, 1.3 cm LAN at portal vein, 0.9 cm LAN Rt renal artery 10/26/14 Mediastinoscopy >> granulomas 11/12/14 Start prednisone  Past medical hx >> GERD, IBS, RLS, Depression, Hypothyroidism  Past surgical hx, Medications, Allergies, Family hx, Social hx all reviewed.   Physical Exam: Blood pressure 118/80, pulse 84, height 5\' 5"  (1.651 m), weight 175 lb (79.379 kg), last menstrual period 06/03/2012, SpO2 97 %. Body mass index is 29.12 kg/(m^2).  General - No distress ENT - No sinus tenderness, no oral exudate, no LAN Cardiac - s1s2 regular, no murmur Chest - No wheeze/rales/dullness, healing mediastinoscopy scar Back - No focal tenderness Abd - Soft, non-tender Ext - No edema Neuro - Normal strength Skin - No rashes Psych - normal mood, and behavior   Assessment/Plan:  Sarcoidosis. Plan: - will start prednisone 40 mg per day - will arrange for PFT's  Lt frontal skull lytic lesion on CT/MRI brain in January 2016. Possible related to sarcoidosis. Plan: - will re-assess her symptoms of headache after trial of prednisone - will need  repeat imaging studies at some point  Joint pain ?if related to sarcoidosis >> no obvious joint swelling. Plan: - she has been set up with rheumatology at [facility] by her PCP   Chesley Mires, MD [facility] Pager:  [phone]

## 2014-11-13 ENCOUNTER — Encounter (HOSPITAL_COMMUNITY): Payer: Self-pay | Admitting: Emergency Medicine

## 2014-11-13 ENCOUNTER — Telehealth: Payer: Self-pay | Admitting: *Deleted

## 2014-11-13 ENCOUNTER — Inpatient Hospital Stay (HOSPITAL_COMMUNITY)
Admission: EM | Admit: 2014-11-13 | Discharge: 2014-11-16 | DRG: 566 | Disposition: A | Discharge status: Home / Self Care | Payer: BLUE CROSS/BLUE SHIELD | Attending: Internal Medicine | Admitting: Internal Medicine

## 2014-11-13 ENCOUNTER — Telehealth: Payer: Self-pay | Admitting: Pulmonary Disease

## 2014-11-13 DIAGNOSIS — R51 Headache: Secondary | ICD-10-CM | POA: Diagnosis not present

## 2014-11-13 DIAGNOSIS — J45909 Unspecified asthma, uncomplicated: Secondary | ICD-10-CM | POA: Diagnosis present

## 2014-11-13 DIAGNOSIS — Z87891 Personal history of nicotine dependence: Secondary | ICD-10-CM

## 2014-11-13 DIAGNOSIS — F329 Major depressive disorder, single episode, unspecified: Secondary | ICD-10-CM | POA: Diagnosis present

## 2014-11-13 DIAGNOSIS — K219 Gastro-esophageal reflux disease without esophagitis: Secondary | ICD-10-CM | POA: Diagnosis present

## 2014-11-13 DIAGNOSIS — R519 Headache, unspecified: Secondary | ICD-10-CM | POA: Diagnosis present

## 2014-11-13 DIAGNOSIS — D869 Sarcoidosis, unspecified: Secondary | ICD-10-CM | POA: Diagnosis present

## 2014-11-13 DIAGNOSIS — Z981 Arthrodesis status: Secondary | ICD-10-CM

## 2014-11-13 DIAGNOSIS — M1990 Unspecified osteoarthritis, unspecified site: Secondary | ICD-10-CM | POA: Diagnosis present

## 2014-11-13 DIAGNOSIS — M898X8 Other specified disorders of bone, other site: Secondary | ICD-10-CM | POA: Diagnosis not present

## 2014-11-13 DIAGNOSIS — Z79899 Other long term (current) drug therapy: Secondary | ICD-10-CM

## 2014-11-13 DIAGNOSIS — E039 Hypothyroidism, unspecified: Secondary | ICD-10-CM | POA: Diagnosis present

## 2014-11-13 DIAGNOSIS — M549 Dorsalgia, unspecified: Secondary | ICD-10-CM | POA: Diagnosis present

## 2014-11-13 DIAGNOSIS — G2581 Restless legs syndrome: Secondary | ICD-10-CM | POA: Diagnosis present

## 2014-11-13 DIAGNOSIS — G8929 Other chronic pain: Secondary | ICD-10-CM | POA: Diagnosis present

## 2014-11-13 HISTORY — DX: Sarcoidosis, unspecified: D86.9

## 2014-11-13 MED ORDER — METOCLOPRAMIDE HCL 5 MG/ML IJ SOLN
10.0000 mg | Freq: Once | INTRAMUSCULAR | Status: AC
  Administered 2014-11-14: 10 mg via INTRAVENOUS
  Filled 2014-11-13: qty 2

## 2014-11-13 MED ORDER — METHYLPREDNISOLONE SODIUM SUCC 125 MG IJ SOLR
125.0000 mg | Freq: Once | INTRAMUSCULAR | Status: AC
  Administered 2014-11-14: 125 mg via INTRAVENOUS
  Filled 2014-11-13: qty 2

## 2014-11-13 MED ORDER — KETOROLAC TROMETHAMINE 30 MG/ML IJ SOLN
30.0000 mg | Freq: Once | INTRAMUSCULAR | Status: AC
  Administered 2014-11-14: 30 mg via INTRAVENOUS
  Filled 2014-11-13: qty 1

## 2014-11-13 MED ORDER — SODIUM CHLORIDE 0.9 % IV BOLUS (SEPSIS)
1000.0000 mL | Freq: Once | INTRAVENOUS | Status: AC
  Administered 2014-11-14: 1000 mL via INTRAVENOUS

## 2014-11-13 MED ORDER — DIPHENHYDRAMINE HCL 50 MG/ML IJ SOLN
50.0000 mg | Freq: Once | INTRAMUSCULAR | Status: AC
  Administered 2014-11-14: 50 mg via INTRAVENOUS
  Filled 2014-11-13: qty 1

## 2014-11-13 NOTE — Telephone Encounter (Signed)
Called with nausea and headache post starting Prednisone 40 mg PO daily. First dose was today. No relief with Tramadol, Dilaudid X 2 and Aleve. Patient advised to go to ED to be evaluated.

## 2014-11-13 NOTE — Telephone Encounter (Signed)
Appt scheduled for PFT 12/04/14 at 2:30 at [facility] Detailed message left on machine to call back if appt will not work.

## 2014-11-13 NOTE — ED Notes (Signed)
Pt has "the worst headache ever"  Not the same as migraine-- feels pressure behind left eye, started on prednisone today for sarcoidosis (newly dx yesterday) took tramadol, aleve, dilaudid at 1pm-- without relief-- called dr on call, was told to come here.  States has a lesion on frontal bone.

## 2014-11-13 NOTE — Telephone Encounter (Signed)
-----   Message from [place] sent at 11/12/2014  5:20 PM EDT ----- Regarding: SCHEDULE PFT Patient needs PFT scheduled. Call in AM 11/13/14

## 2014-11-13 NOTE — ED Provider Notes (Signed)
CSN: [id number]     Arrival date & time 11/13/14  2106 History  This chart was scribed for Everlene Balls, MD by Delphia Grates, ED Scribe. This patient was seen in room D34C/D34C and the patient's care was started at 11:33 PM.   Chief Complaint  Patient presents with  . Headache    "worst ever"    The history is provided by the patient. No language interpreter was used.     HPI Comments: [name] is a 49 y.o. female, with history of migraines, sarcoid, and frontal bone lesion, who presents to the Emergency Department complaining of constant, worsening, anterior headache that began today. She describes the pain as pressure, specifically behind the left eye. Patient states she was diagnosed with sarcoid yesterday and started 40mg  Prednisone today. There is associated nausea and blurred vision in left eye today. She reports history of migraines and states she has not had one in awhile and states this does not feel similar.Patient has taken Tramadol and Aleve (last dose this morning at 0900). She also reports taking dilaudid this evening and took a nap, however, she reports the pain persisted upon waking. She denies numbness/weakness, vomiting, diarrhea, fever, chills, or cold symptoms.   Past Medical History  Diagnosis Date  . IC (interstitial cystitis)   . RLS (restless legs syndrome)   . GERD (gastroesophageal reflux disease)   . Depression   . Hypothyroidism   . Environmental allergies   . IBS (irritable bowel syndrome)   . Frequency of urination   . Nocturia   . SUI (stress urinary incontinence, female)   . Erosion of suburethral sling   . Herpes simplex type 2 infection   . NSVD (normal spontaneous vaginal delivery)     x3  . Asthma   . Heart murmur     "when I was a baby"  . Chronic bronchitis     "get it close to q yr" (09/18/2014)  . Migraine     "maybe once/month" (09/18/2014)  . Headache     "at least several times/wk" (09/18/2014)  . Seizures     "when I was a  teenager"  . Arthritis     "back, hands, neck" (09/18/2014)  . Chronic back pain     "whole spine"  . Anxiety   . Shortness of breath dyspnea     Exertion  . Sarcoid    Past Surgical History  Procedure Laterality Date  . Cesarean section  2000  . Pelvic laparoscopy  1990's    LYSIS ADHESIONS  . Tonsillectomy  1986  . Appendectomy  1985  . Anterior cervical decomp/discectomy fusion  2011  . Abdominal hysterectomy  2006  . Knee arthroscopy Right   . Lumbar fusion  12-04-2008    L4  -- S1  . Lumbar disc surgery  02-02-2007    RIGHT SIDE L5 -- S1  . Lynx retropubic suburethral sling  03-02-2007  . Back surgery  2008  . Tubal ligation  2001    hulka clip  . Pubovaginal sling N/A 11/30/2012    Procedure: Gaynelle Arabian;  Surgeon: Bernestine Amass, MD;  Location: [facility];  Service: Urology;  Laterality: N/A;  1 HR EXAM UNDER ANESTHESIA, EXCISION OF SUB URETHRAL MESH, CYSTO, HOD   . Cysto with hydrodistension N/A 11/30/2012    Procedure: CYSTOSCOPY/HYDRODISTENSION;  Surgeon: Bernestine Amass, MD;  Location: [facility];  Service: Urology;  Laterality: N/A;  . Video bronchoscopy with endobronchial ultrasound  N/A 09/21/2014    Procedure: VIDEO BRONCHOSCOPY WITH ENDOBRONCHIAL ULTRASOUND;  Surgeon: Collene Gobble, MD;  Location: [place];  Service: Thoracic;  Laterality: N/A;  . Mediastinoscopy N/A 10/26/2014    Procedure: MEDIASTINOSCOPY;  Surgeon: Melrose Nakayama, MD;  Location: [facility];  Service: Thoracic;  Laterality: N/A;   Family History  Problem Relation Age of Onset  . Diabetes Mother   . Hypertension Mother   . Heart disease Mother   . Cancer Father     LUNG AND BRAIN  . Hypertension Father   . Heart disease Maternal Grandmother   . Cancer Maternal Grandfather     LUNG  . Heart disease Paternal Grandmother   . Cancer Paternal Grandfather    History  Substance Use Topics  . Smoking status: Former Smoker -- 0.50 packs/day for 15 years     Types: Cigarettes    Quit date: 01/15/2014  . Smokeless tobacco: Never Used  . Alcohol Use: 0.0 oz/week    0 Standard drinks or equivalent per week     Comment: ocassional   OB History    Gravida Para Term Preterm AB TAB SAB Ectopic Multiple Living   4 4        4      Review of Systems  A complete 10 system review of systems was obtained and all systems are negative except as noted in the HPI and PMH.   Allergies  Bactrim; Percocet; and Vicodin  Home Medications   Prior to Admission medications   Medication Sig Start Date End Date Taking? Authorizing Provider  albuterol (PROVENTIL HFA;VENTOLIN HFA) 108 (90 BASE) MCG/ACT inhaler Inhale 2 puffs into the lungs every 6 (six) hours as needed. For shortness of breath 04/25/13   Elayne Snare, MD  diazepam (VALIUM) 2 MG tablet Take 2 mg by mouth at bedtime.    Historical Provider, MD  dicyclomine (BENTYL) 20 MG tablet Take 20 mg by mouth every 6 (six) hours.    Historical Provider, MD  HYDROmorphone (DILAUDID) 2 MG tablet Take 1 tablet (2 mg total) by mouth every 4 (four) hours as needed for moderate pain or severe pain. 10/26/14   Melrose Nakayama, MD  levothyroxine (SYNTHROID, LEVOTHROID) 137 MCG tablet Take 137 mcg by mouth every morning.     Historical Provider, MD  meloxicam (MOBIC) 7.5 MG tablet Take 7.5 mg by mouth 2 (two) times daily as needed. 06/27/14   Historical Provider, MD  naproxen sodium (ANAPROX) 220 MG tablet Take 220 mg by mouth 2 (two) times daily with a meal.    Historical Provider, MD  omeprazole (PRILOSEC) 40 MG capsule Take 40 mg by mouth at bedtime.    Historical Provider, MD  PARoxetine (PAXIL) 20 MG tablet Take 20 mg by mouth at bedtime.    Historical Provider, MD  predniSONE (DELTASONE) 20 MG tablet Take 2 tablets (40 mg total) by mouth daily with breakfast. 11/12/14   Chesley Mires, MD  rOPINIRole (REQUIP) 2 MG tablet Take 2 mg by mouth at bedtime.    Historical Provider, MD  traMADol (ULTRAM) 50 MG tablet Take 50  mg by mouth every 6 (six) hours as needed. 10/29/14   Historical Provider, MD  traZODone (DESYREL) 50 MG tablet Take 50 mg by mouth at bedtime.    Historical Provider, MD  valACYclovir (VALTREX) 500 MG tablet Take 500 mg by mouth at bedtime.    Historical Provider, MD   Triage Vitals: BP 149/87 mmHg  Pulse 64  Temp(Src) 98.1  F (36.7 C) (Oral)  Resp 18  SpO2 96%  LMP 06/03/2012  Physical Exam  Constitutional: She is oriented to person, place, and time. She appears well-developed and well-nourished. No distress.  HENT:  Head: Normocephalic and atraumatic.  Nose: Nose normal.  Mouth/Throat: Oropharynx is clear and moist. No oropharyngeal exudate.  Eyes: Conjunctivae and EOM are normal. Pupils are equal, round, and reactive to light. No scleral icterus.  Neck: Normal range of motion. Neck supple. No JVD present. No tracheal deviation present. No thyromegaly present.  Cardiovascular: Normal rate, regular rhythm and normal heart sounds.  Exam reveals no gallop and no friction rub.   No murmur heard. Pulmonary/Chest: Effort normal and breath sounds normal. No respiratory distress. She has no wheezes. She exhibits no tenderness.  Abdominal: Soft. Bowel sounds are normal. She exhibits no distension and no mass. There is no tenderness. There is no rebound and no guarding.  Musculoskeletal: Normal range of motion. She exhibits no edema or tenderness.  Lymphadenopathy:    She has no cervical adenopathy.  Neurological: She is alert and oriented to person, place, and time. No cranial nerve deficit. She exhibits normal muscle tone.  Normal strength and sensation in all 4 extremities. Normal cerebellar tests.  Skin: Skin is warm and dry. No rash noted. No erythema. No pallor.  Nursing note and vitals reviewed.   ED Course  Procedures (including critical care time)  DIAGNOSTIC STUDIES: Oxygen Saturation is 96% on room air, adequate by my interpretation.    COORDINATION OF CARE: At 2339  Discussed treatment plan with patient with includes CXR and labs. Patient agrees.   At 0241 Patient notes some pressure behind her right eye. Discussed with patient an increase in Prednisone and discharge with Reglan.  Labs Review Labs Reviewed  CBC WITH DIFFERENTIAL/PLATELET - Abnormal; Notable for the following:    Hemoglobin 11.9 (*)    HCT 34.8 (*)    All other components within normal limits  BASIC METABOLIC PANEL - Abnormal; Notable for the following:    Glucose, Bld 114 (*)    GFR calc non Af Amer 83 (*)    Anion gap 4 (*)    All other components within normal limits  URINALYSIS, ROUTINE W REFLEX MICROSCOPIC  POC URINE PREG, ED    Imaging Review Dg Chest 2 View  11/14/2014   CLINICAL DATA:  Headache.  Sarcoidosis.  EXAM: CHEST  2 VIEW  COMPARISON:  10/26/2014  FINDINGS: Mediastinal and hilar adenopathy is again evident without significant interval change. The lungs are clear. There are no effusions. Heart size is normal. Pulmonary vasculature is normal.  IMPRESSION: Stable mediastinal and bilateral hilar adenopathy consistent with the described history of sarcoidosis. No acute cardiopulmonary findings.   Electronically Signed   By: Andreas Newport M.D.   On: 11/14/2014 01:51     EKG Interpretation None      MDM   Final diagnoses:  None    Patient presents to the emergency department for worsening headache. Patient states she was recently diagnosed with neurosarcoidosis. She's only taken 1 dose of her prednisone medication. She was given Solu-Medrol, Toradol, Reglan, Benadryl the emergency department for treatment. Upon repeat evaluation patient initially stated that her headache had improved however this came back throughout her course in the emergency department. Because of this I believe the patient needs inpatient steroid therapy for this neurosarcoidosis flare. I spoke with neurology who will evaluate the patient and write a consult note. [facility] was [facility] to [facility], MedSurg unit.  I personally performed the services described in this documentation, which was scribed in my presence. The recorded information has been reviewed and is accurate.   Everlene Balls, MD 11/14/14 [phone]

## 2014-11-14 ENCOUNTER — Encounter (HOSPITAL_COMMUNITY): Payer: Self-pay | Admitting: Internal Medicine

## 2014-11-14 ENCOUNTER — Inpatient Hospital Stay (HOSPITAL_COMMUNITY): Payer: BLUE CROSS/BLUE SHIELD

## 2014-11-14 ENCOUNTER — Emergency Department (HOSPITAL_COMMUNITY): Payer: BLUE CROSS/BLUE SHIELD

## 2014-11-14 DIAGNOSIS — D869 Sarcoidosis, unspecified: Secondary | ICD-10-CM

## 2014-11-14 DIAGNOSIS — G44031 Episodic paroxysmal hemicrania, intractable: Secondary | ICD-10-CM

## 2014-11-14 DIAGNOSIS — R51 Headache: Secondary | ICD-10-CM | POA: Diagnosis present

## 2014-11-14 DIAGNOSIS — Z79899 Other long term (current) drug therapy: Secondary | ICD-10-CM | POA: Diagnosis not present

## 2014-11-14 DIAGNOSIS — G8929 Other chronic pain: Secondary | ICD-10-CM | POA: Diagnosis present

## 2014-11-14 DIAGNOSIS — R519 Headache, unspecified: Secondary | ICD-10-CM | POA: Diagnosis present

## 2014-11-14 DIAGNOSIS — M898X8 Other specified disorders of bone, other site: Secondary | ICD-10-CM | POA: Diagnosis present

## 2014-11-14 DIAGNOSIS — Z87891 Personal history of nicotine dependence: Secondary | ICD-10-CM | POA: Diagnosis not present

## 2014-11-14 DIAGNOSIS — J4520 Mild intermittent asthma, uncomplicated: Secondary | ICD-10-CM

## 2014-11-14 DIAGNOSIS — F329 Major depressive disorder, single episode, unspecified: Secondary | ICD-10-CM | POA: Diagnosis present

## 2014-11-14 DIAGNOSIS — K219 Gastro-esophageal reflux disease without esophagitis: Secondary | ICD-10-CM | POA: Diagnosis present

## 2014-11-14 DIAGNOSIS — J45909 Unspecified asthma, uncomplicated: Secondary | ICD-10-CM | POA: Diagnosis present

## 2014-11-14 DIAGNOSIS — Z981 Arthrodesis status: Secondary | ICD-10-CM | POA: Diagnosis not present

## 2014-11-14 DIAGNOSIS — G2581 Restless legs syndrome: Secondary | ICD-10-CM | POA: Diagnosis present

## 2014-11-14 DIAGNOSIS — E039 Hypothyroidism, unspecified: Secondary | ICD-10-CM | POA: Diagnosis present

## 2014-11-14 DIAGNOSIS — M549 Dorsalgia, unspecified: Secondary | ICD-10-CM | POA: Diagnosis present

## 2014-11-14 DIAGNOSIS — M1990 Unspecified osteoarthritis, unspecified site: Secondary | ICD-10-CM | POA: Diagnosis present

## 2014-11-14 LAB — BASIC METABOLIC PANEL
ANION GAP: 4 — AB (ref 5–15)
BUN: 19 mg/dL (ref 6–23)
CO2: 28 mmol/L (ref 19–32)
CREATININE: 0.82 mg/dL (ref 0.50–1.10)
Calcium: 9 mg/dL (ref 8.4–10.5)
Chloride: 106 mmol/L (ref 96–112)
GFR calc non Af Amer: 83 mL/min — ABNORMAL LOW (ref >90)
Glucose, Bld: 114 mg/dL — ABNORMAL HIGH (ref 70–99)
POTASSIUM: 4.1 mmol/L (ref 3.5–5.1)
SODIUM: 138 mmol/L (ref 135–145)

## 2014-11-14 LAB — COMPREHENSIVE METABOLIC PANEL
ALBUMIN: 4 g/dL (ref 3.5–5.2)
ALK PHOS: 88 U/L (ref 39–117)
ALT: 21 U/L (ref 0–35)
ANION GAP: 5 (ref 5–15)
AST: 21 U/L (ref 0–37)
BUN: 18 mg/dL (ref 6–23)
CALCIUM: 9.1 mg/dL (ref 8.4–10.5)
CO2: 25 mmol/L (ref 19–32)
Chloride: 108 mmol/L (ref 96–112)
Creatinine, Ser: 0.84 mg/dL (ref 0.50–1.10)
GFR calc Af Amer: >90 mL/min (ref >90)
GFR calc non Af Amer: 81 mL/min — ABNORMAL LOW (ref >90)
GLUCOSE: 187 mg/dL — AB (ref 70–99)
Potassium: 4.1 mmol/L (ref 3.5–5.1)
SODIUM: 138 mmol/L (ref 135–145)
TOTAL PROTEIN: 6.6 g/dL (ref 6.0–8.3)
Total Bilirubin: 0.4 mg/dL (ref 0.3–1.2)

## 2014-11-14 LAB — URINALYSIS, ROUTINE W REFLEX MICROSCOPIC
Bilirubin Urine: NEGATIVE
GLUCOSE, UA: NEGATIVE mg/dL
Hgb urine dipstick: NEGATIVE
Ketones, ur: NEGATIVE mg/dL
LEUKOCYTES UA: NEGATIVE
Nitrite: NEGATIVE
PROTEIN: NEGATIVE mg/dL
SPECIFIC GRAVITY, URINE: 1.011 (ref 1.005–1.030)
Urobilinogen, UA: 0.2 mg/dL (ref 0.0–1.0)
pH: 6 (ref 5.0–8.0)

## 2014-11-14 LAB — CBC WITH DIFFERENTIAL/PLATELET
Basophils Absolute: 0 10*3/uL (ref 0.0–0.1)
Basophils Absolute: 0 10*3/uL (ref 0.0–0.1)
Basophils Relative: 0 % (ref 0–1)
Basophils Relative: 0 % (ref 0–1)
EOS ABS: 0 10*3/uL (ref 0.0–0.7)
EOS PCT: 0 % (ref 0–5)
Eosinophils Absolute: 0.1 10*3/uL (ref 0.0–0.7)
Eosinophils Relative: 1 % (ref 0–5)
HCT: 34.8 % — ABNORMAL LOW (ref 36.0–46.0)
HCT: 37.6 % (ref 36.0–46.0)
HEMOGLOBIN: 12.9 g/dL (ref 12.0–15.0)
Hemoglobin: 11.9 g/dL — ABNORMAL LOW (ref 12.0–15.0)
LYMPHS ABS: 0.9 10*3/uL (ref 0.7–4.0)
LYMPHS PCT: 26 % (ref 12–46)
Lymphocytes Relative: 11 % — ABNORMAL LOW (ref 12–46)
Lymphs Abs: 2.2 10*3/uL (ref 0.7–4.0)
MCH: 29.1 pg (ref 26.0–34.0)
MCH: 29.2 pg (ref 26.0–34.0)
MCHC: 34.2 g/dL (ref 30.0–36.0)
MCHC: 34.3 g/dL (ref 30.0–36.0)
MCV: 85.1 fL (ref 78.0–100.0)
MCV: 85.1 fL (ref 78.0–100.0)
MONOS PCT: 1 % — AB (ref 3–12)
Monocytes Absolute: 0.1 10*3/uL (ref 0.1–1.0)
Monocytes Absolute: 0.7 10*3/uL (ref 0.1–1.0)
Monocytes Relative: 8 % (ref 3–12)
Neutro Abs: 5.4 10*3/uL (ref 1.7–7.7)
Neutro Abs: 7.3 10*3/uL (ref 1.7–7.7)
Neutrophils Relative %: 65 % (ref 43–77)
Neutrophils Relative %: 88 % — ABNORMAL HIGH (ref 43–77)
PLATELETS: 218 10*3/uL (ref 150–400)
Platelets: 226 10*3/uL (ref 150–400)
RBC: 4.09 MIL/uL (ref 3.87–5.11)
RBC: 4.42 MIL/uL (ref 3.87–5.11)
RDW: 13.9 % (ref 11.5–15.5)
RDW: 14 % (ref 11.5–15.5)
WBC: 8.3 10*3/uL (ref 4.0–10.5)
WBC: 8.3 10*3/uL (ref 4.0–10.5)

## 2014-11-14 LAB — TSH: TSH: 0.656 u[IU]/mL (ref 0.350–4.500)

## 2014-11-14 LAB — SEDIMENTATION RATE: SED RATE: 3 mm/h (ref 0–22)

## 2014-11-14 MED ORDER — VALACYCLOVIR HCL 500 MG PO TABS
500.0000 mg | ORAL_TABLET | Freq: Every day | ORAL | Status: DC
  Administered 2014-11-14 – 2014-11-15 (×2): 500 mg via ORAL
  Filled 2014-11-14 (×2): qty 1

## 2014-11-14 MED ORDER — ALBUTEROL SULFATE (2.5 MG/3ML) 0.083% IN NEBU: 3.0000 mL | INHALATION_SOLUTION | Freq: Four times a day (QID) | RESPIRATORY_TRACT | Status: DC | PRN

## 2014-11-14 MED ORDER — GADOBENATE DIMEGLUMINE 529 MG/ML IV SOLN
15.0000 mL | Freq: Once | INTRAVENOUS | Status: AC | PRN
  Administered 2014-11-14: 15 mL via INTRAVENOUS

## 2014-11-14 MED ORDER — ONDANSETRON HCL 4 MG PO TABS: 4.0000 mg | ORAL_TABLET | Freq: Four times a day (QID) | ORAL | Status: DC | PRN

## 2014-11-14 MED ORDER — KETOROLAC TROMETHAMINE 15 MG/ML IJ SOLN
15.0000 mg | Freq: Four times a day (QID) | INTRAMUSCULAR | Status: DC | PRN
  Administered 2014-11-14: 15 mg via INTRAVENOUS
  Filled 2014-11-14 (×2): qty 1

## 2014-11-14 MED ORDER — ACETAMINOPHEN 325 MG PO TABS
650.0000 mg | ORAL_TABLET | Freq: Four times a day (QID) | ORAL | Status: DC | PRN
  Administered 2014-11-14 – 2014-11-15 (×2): 650 mg via ORAL
  Filled 2014-11-14 (×2): qty 2

## 2014-11-14 MED ORDER — KETOROLAC TROMETHAMINE 30 MG/ML IJ SOLN
30.0000 mg | Freq: Three times a day (TID) | INTRAMUSCULAR | Status: AC
  Administered 2014-11-14 – 2014-11-15 (×3): 30 mg via INTRAVENOUS
  Filled 2014-11-14 (×3): qty 1

## 2014-11-14 MED ORDER — METOCLOPRAMIDE HCL 10 MG PO TABS: 10.0000 mg | ORAL_TABLET | Freq: Three times a day (TID) | ORAL | Status: DC | PRN

## 2014-11-14 MED ORDER — DIPHENHYDRAMINE HCL 50 MG/ML IJ SOLN
12.5000 mg | Freq: Three times a day (TID) | INTRAMUSCULAR | Status: AC
  Administered 2014-11-14 – 2014-11-15 (×3): 12.5 mg via INTRAVENOUS
  Filled 2014-11-14 (×3): qty 1

## 2014-11-14 MED ORDER — METOCLOPRAMIDE HCL 5 MG/ML IJ SOLN
10.0000 mg | Freq: Three times a day (TID) | INTRAMUSCULAR | Status: AC
  Administered 2014-11-14 – 2014-11-15 (×3): 10 mg via INTRAVENOUS
  Filled 2014-11-14 (×3): qty 2

## 2014-11-14 MED ORDER — DICYCLOMINE HCL 20 MG PO TABS
20.0000 mg | ORAL_TABLET | Freq: Four times a day (QID) | ORAL | Status: DC
  Administered 2014-11-14 – 2014-11-16 (×10): 20 mg via ORAL
  Filled 2014-11-14 (×13): qty 1

## 2014-11-14 MED ORDER — TRAZODONE HCL 50 MG PO TABS
50.0000 mg | ORAL_TABLET | Freq: Every day | ORAL | Status: DC
  Administered 2014-11-14 – 2014-11-15 (×2): 50 mg via ORAL
  Filled 2014-11-14 (×2): qty 1

## 2014-11-14 MED ORDER — VALPROATE SODIUM 500 MG/5ML IV SOLN
500.0000 mg | Freq: Once | INTRAVENOUS | Status: AC
  Administered 2014-11-14: 500 mg via INTRAVENOUS
  Filled 2014-11-14: qty 5

## 2014-11-14 MED ORDER — DIAZEPAM 2 MG PO TABS
2.0000 mg | ORAL_TABLET | Freq: Every day | ORAL | Status: DC
  Administered 2014-11-14 – 2014-11-15 (×2): 2 mg via ORAL
  Filled 2014-11-14 (×2): qty 1

## 2014-11-14 MED ORDER — PREDNISONE 20 MG PO TABS
40.0000 mg | ORAL_TABLET | Freq: Every day | ORAL | Status: DC
  Administered 2014-11-14 – 2014-11-16 (×3): 40 mg via ORAL
  Filled 2014-11-14 (×4): qty 2

## 2014-11-14 MED ORDER — ROPINIROLE HCL 1 MG PO TABS
2.0000 mg | ORAL_TABLET | Freq: Every day | ORAL | Status: DC
  Administered 2014-11-14 – 2014-11-15 (×2): 2 mg via ORAL
  Filled 2014-11-14 (×2): qty 2

## 2014-11-14 MED ORDER — SODIUM CHLORIDE 0.9 % IV SOLN
INTRAVENOUS | Status: DC
  Administered 2014-11-14 – 2014-11-15 (×2): via INTRAVENOUS

## 2014-11-14 MED ORDER — PAROXETINE HCL 20 MG PO TABS
20.0000 mg | ORAL_TABLET | Freq: Every day | ORAL | Status: DC
  Administered 2014-11-14 – 2014-11-15 (×2): 20 mg via ORAL
  Filled 2014-11-14 (×2): qty 1

## 2014-11-14 MED ORDER — HYDROMORPHONE HCL 2 MG PO TABS: 2.0000 mg | ORAL_TABLET | ORAL | Status: DC | PRN

## 2014-11-14 MED ORDER — HYDROMORPHONE HCL 1 MG/ML IJ SOLN
1.0000 mg | INTRAMUSCULAR | Status: DC | PRN
  Administered 2014-11-14 – 2014-11-16 (×12): 1 mg via INTRAVENOUS
  Filled 2014-11-14 (×12): qty 1

## 2014-11-14 MED ORDER — LEVOTHYROXINE SODIUM 137 MCG PO TABS
137.0000 ug | ORAL_TABLET | Freq: Every day | ORAL | Status: DC
  Administered 2014-11-14 – 2014-11-16 (×3): 137 ug via ORAL
  Filled 2014-11-14 (×5): qty 1

## 2014-11-14 MED ORDER — ACETAMINOPHEN 650 MG RE SUPP: 650.0000 mg | Freq: Four times a day (QID) | RECTAL | Status: DC | PRN

## 2014-11-14 MED ORDER — ALBUTEROL SULFATE HFA 108 (90 BASE) MCG/ACT IN AERS: 2.0000 | INHALATION_SPRAY | Freq: Four times a day (QID) | RESPIRATORY_TRACT | Status: DC | PRN

## 2014-11-14 MED ORDER — PANTOPRAZOLE SODIUM 40 MG PO TBEC
40.0000 mg | DELAYED_RELEASE_TABLET | Freq: Every day | ORAL | Status: DC
  Administered 2014-11-14 – 2014-11-16 (×3): 40 mg via ORAL
  Filled 2014-11-14 (×3): qty 1

## 2014-11-14 MED ORDER — ONDANSETRON HCL 4 MG/2ML IJ SOLN: 4.0000 mg | Freq: Four times a day (QID) | INTRAMUSCULAR | Status: DC | PRN

## 2014-11-14 NOTE — Consult Note (Signed)
Reason for Consult:Headache Referring Physician: Oni  CC: Headache  HPI: [name] is an 49 y.o. female with a history of a headache since January.  Reports that it has been fairly persistent.  Is located behind the left eye and has some blurry vision in the left eye that she has not mentioned to medical professionals for the past month.  The vision has not interfered with her activities of daily living.  Is seeing Dr. Halford Chessman and within the past week was diagnosed with sarcoid.  Was started on Prednisone and her first dose was yesterday.  About an hour after her dose her headache got worse and she had associated nausea and vomiting.  She rated her headache at a 15/10 at that time.  Headache is usually a 6/10 and is now after ED treatment a 6/10.    Past Medical History  Diagnosis Date  . IC (interstitial cystitis)   . RLS (restless legs syndrome)   . GERD (gastroesophageal reflux disease)   . Depression   . Hypothyroidism   . Environmental allergies   . IBS (irritable bowel syndrome)   . Frequency of urination   . Nocturia   . SUI (stress urinary incontinence, female)   . Erosion of suburethral sling   . Herpes simplex type 2 infection   . NSVD (normal spontaneous vaginal delivery)     x3  . Asthma   . Heart murmur     "when I was a baby"  . Chronic bronchitis     "get it close to q yr" (09/18/2014)  . Migraine     "maybe once/month" (09/18/2014)  . Headache     "at least several times/wk" (09/18/2014)  . Seizures     "when I was a teenager"  . Arthritis     "back, hands, neck" (09/18/2014)  . Chronic back pain     "whole spine"  . Anxiety   . Shortness of breath dyspnea     Exertion  . Sarcoid     Past Surgical History  Procedure Laterality Date  . Cesarean section  2000  . Pelvic laparoscopy  1990's    LYSIS ADHESIONS  . Tonsillectomy  1986  . Appendectomy  1985  . Anterior cervical decomp/discectomy fusion  2011  . Abdominal hysterectomy  2006  . Knee  arthroscopy Right   . Lumbar fusion  12-04-2008    L4  -- S1  . Lumbar disc surgery  02-02-2007    RIGHT SIDE L5 -- S1  . Lynx retropubic suburethral sling  03-02-2007  . Back surgery  2008  . Tubal ligation  2001    hulka clip  . Pubovaginal sling N/A 11/30/2012    Procedure: Gaynelle Arabian;  Surgeon: Bernestine Amass, MD;  Location: [facility];  Service: Urology;  Laterality: N/A;  1 HR EXAM UNDER ANESTHESIA, EXCISION OF SUB URETHRAL MESH, CYSTO, HOD   . Cysto with hydrodistension N/A 11/30/2012    Procedure: CYSTOSCOPY/HYDRODISTENSION;  Surgeon: Bernestine Amass, MD;  Location: [facility];  Service: Urology;  Laterality: N/A;  . Video bronchoscopy with endobronchial ultrasound N/A 09/21/2014    Procedure: VIDEO BRONCHOSCOPY WITH ENDOBRONCHIAL ULTRASOUND;  Surgeon: Collene Gobble, MD;  Location: [place];  Service: Thoracic;  Laterality: N/A;  . Mediastinoscopy N/A 10/26/2014    Procedure: MEDIASTINOSCOPY;  Surgeon: Melrose Nakayama, MD;  Location: [place];  Service: Thoracic;  Laterality: N/A;    Family History  Problem Relation Age of Onset  .  Diabetes Mother   . Hypertension Mother   . Heart disease Mother   . Cancer Father     LUNG AND BRAIN  . Hypertension Father   . Heart disease Maternal Grandmother   . Cancer Maternal Grandfather     LUNG  . Heart disease Paternal Grandmother   . Cancer Paternal Grandfather     Social History:  reports that she quit smoking about 9 months ago. Her smoking use included Cigarettes. She has a 7.5 pack-year smoking history. She has never used smokeless tobacco. She reports that she drinks alcohol. She reports that she does not use illicit drugs.  Allergies  Allergen Reactions  . Bactrim [Sulfamethoxazole-Trimethoprim] Other (See Comments)    Fever   . Percocet [Oxycodone-Acetaminophen] Itching  . Vicodin [Hydrocodone-Acetaminophen] Itching    Medications: I have reviewed the patient's current  medications. Prior to Admission:  No current facility-administered medications for this encounter.  Current outpatient prescriptions:  .  albuterol (PROVENTIL HFA;VENTOLIN HFA) 108 (90 BASE) MCG/ACT inhaler, Inhale 2 puffs into the lungs every 6 (six) hours as needed. For shortness of breath, Disp: 3.7 g, Rfl: 1 .  diazepam (VALIUM) 2 MG tablet, Take 2 mg by mouth at bedtime., Disp: , Rfl:  .  dicyclomine (BENTYL) 20 MG tablet, Take 20 mg by mouth every 6 (six) hours., Disp: , Rfl:  .  HYDROmorphone (DILAUDID) 2 MG tablet, Take 1 tablet (2 mg total) by mouth every 4 (four) hours as needed for moderate pain or severe pain., Disp: 30 tablet, Rfl: 0 .  levothyroxine (SYNTHROID, LEVOTHROID) 137 MCG tablet, Take 137 mcg by mouth every morning. , Disp: , Rfl:  .  meloxicam (MOBIC) 7.5 MG tablet, Take 7.5 mg by mouth 2 (two) times daily as needed., Disp: , Rfl: 0 .  naproxen sodium (ANAPROX) 220 MG tablet, Take 220 mg by mouth 2 (two) times daily with a meal., Disp: , Rfl:  .  omeprazole (PRILOSEC) 40 MG capsule, Take 40 mg by mouth at bedtime., Disp: , Rfl:  .  PARoxetine (PAXIL) 20 MG tablet, Take 20 mg by mouth at bedtime., Disp: , Rfl:  .  predniSONE (DELTASONE) 20 MG tablet, Take 2 tablets (40 mg total) by mouth daily with breakfast., Disp: 60 tablet, Rfl: 1 .  rOPINIRole (REQUIP) 2 MG tablet, Take 2 mg by mouth at bedtime., Disp: , Rfl:  .  traMADol (ULTRAM) 50 MG tablet, Take 50 mg by mouth every 6 (six) hours as needed., Disp: , Rfl: 1 .  traZODone (DESYREL) 50 MG tablet, Take 50 mg by mouth at bedtime., Disp: , Rfl:  .  valACYclovir (VALTREX) 500 MG tablet, Take 500 mg by mouth at bedtime., Disp: , Rfl:  .  metoCLOPramide (REGLAN) 10 MG tablet, Take 1 tablet (10 mg total) by mouth every 8 (eight) hours as needed (headache)., Disp: 10 tablet, Rfl: 0  ROS: History obtained from the patient  General ROS: negative for - chills, fatigue, fever, night sweats, weight gain or weight  loss Psychological ROS: negative for - behavioral disorder, hallucinations, memory difficulties, mood swings or suicidal ideation Ophthalmic ROS: as noted in HPI ENT ROS: negative for - epistaxis, nasal discharge, oral lesions, sore throat, tinnitus or vertigo Allergy and Immunology ROS: negative for - hives or itchy/watery eyes Hematological and Lymphatic ROS: negative for - bleeding problems, bruising or swollen lymph nodes Endocrine ROS: negative for - galactorrhea, hair pattern changes, polydipsia/polyuria or temperature intolerance Respiratory ROS: negative for - cough, hemoptysis, shortness of  breath or wheezing Cardiovascular ROS: negative for - chest pain, dyspnea on exertion, edema or irregular heartbeat Gastrointestinal ROS: negative for - abdominal pain, diarrhea, hematemesis, nausea/vomiting or stool incontinence Genito-Urinary ROS: negative for - dysuria, hematuria, incontinence or urinary frequency/urgency Musculoskeletal ROS: negative for - joint swelling or muscular weakness Neurological ROS: as noted in HPI Dermatological ROS: negative for rash and skin lesion changes  Physical Examination: Blood pressure 138/83, pulse 79, temperature 98.1 F (36.7 C), temperature source Oral, resp. rate 13, last menstrual period 06/03/2012, SpO2 92 %.  HEENT-  Normocephalic, no lesions, without obvious abnormality.  Normal external eye and conjunctiva.  Normal TM's bilaterally.  Normal auditory canals and external ears. Normal external nose, mucus membranes and septum.  Normal pharynx. Cardiovascular- S1, S2 normal, pulses palpable throughout   Lungs- chest clear, no wheezing, rales, normal symmetric air entry Abdomen- soft, non-tender; bowel sounds normal; no masses,  no organomegaly Extremities- no edema Lymph-no adenopathy palpable Musculoskeletal-no joint tenderness, deformity or swelling Skin-warm and dry, no hyperpigmentation, vitiligo, or suspicious lesions  Neurological  Examination Mental Status: Alert, oriented, thought content appropriate.  Speech fluent without evidence of aphasia.  Able to follow 3 step commands without difficulty. Cranial Nerves: II: Discs flat bilaterally; Visual fields grossly normal, pupils equal, round, reactive to light and accommodation III,IV, VI: ptosis not present, extra-ocular motions intact bilaterally V,VII: smile symmetric, facial light touch sensation normal bilaterally VIII: hearing normal bilaterally IX,X: gag reflex present XI: bilateral shoulder shrug XII: midline tongue extension Motor: Right : Upper extremity   5/5    Left:     Upper extremity   5/5  Lower extremity   5/5     Lower extremity   5/5 Tone and bulk:normal tone throughout; no atrophy noted Sensory: Pinprick and light touch intact throughout, bilaterally Deep Tendon Reflexes: 2+ and symmetric throughout Plantars: Right: upgoing   Left: downgoing Cerebellar: normal finger-to-nose and normal heel-to-shin testing bilaterally Gait: normal gait and station    Laboratory Studies:   Basic Metabolic Panel:  Recent Labs Lab 11/14/14 0012  NA 138  K 4.1  CL 106  CO2 28  GLUCOSE 114*  BUN 19  CREATININE 0.82  CALCIUM 9.0    Liver Function Tests: No results for input(s): AST, ALT, ALKPHOS, BILITOT, PROT, ALBUMIN in the last 168 hours. No results for input(s): LIPASE, AMYLASE in the last 168 hours. No results for input(s): AMMONIA in the last 168 hours.  CBC:  Recent Labs Lab 11/14/14 0012  WBC 8.3  NEUTROABS 5.4  HGB 11.9*  HCT 34.8*  MCV 85.1  PLT 218    Cardiac Enzymes: No results for input(s): CKTOTAL, CKMB, CKMBINDEX, TROPONINI in the last 168 hours.  BNP: Invalid input(s): POCBNP  CBG: No results for input(s): GLUCAP in the last 168 hours.  Microbiology: Results for orders placed or performed during the hospital encounter of 10/26/14  Surgical pcr screen     Status: None   Collection Time: 10/26/14 12:07 PM  Result  Value Ref Range Status   MRSA, PCR NEGATIVE NEGATIVE Final   Staphylococcus aureus NEGATIVE NEGATIVE Final    Comment:        The Xpert SA Assay (FDA approved for NASAL specimens in patients over 62 years of age), is one component of a comprehensive surveillance program.  Test performance has been validated by [facility] for patients greater than or equal to 24 year old. It is not intended to diagnose infection nor to guide or monitor treatment.  Fungus Culture with Smear     Status: None (Preliminary result)   Collection Time: 10/26/14  2:03 PM  Result Value Ref Range Status   Specimen Description TISSUE LYMPH NODE  Final   Special Requests NONE  Final   Fungal Smear   Final    NO YEAST OR FUNGAL ELEMENTS SEEN Performed at [facility]    Culture   Final    CULTURE IN PROGRESS FOR FOUR WEEKS Performed at [facility]    Report Status PENDING  Incomplete  AFB culture with smear     Status: None (Preliminary result)   Collection Time: 10/26/14  2:03 PM  Result Value Ref Range Status   Specimen Description TISSUE LYMPH NODE  Final   Special Requests NONE  Final   Acid Fast Smear   Final    NO ACID FAST BACILLI SEEN Performed at [facility]    Culture   Final    CULTURE WILL BE EXAMINED FOR 6 WEEKS BEFORE ISSUING A FINAL REPORT Performed at [facility]    Report Status PENDING  Incomplete    Coagulation Studies: No results for input(s): LABPROT, INR in the last 72 hours.  Urinalysis: No results for input(s): COLORURINE, LABSPEC, PHURINE, GLUCOSEU, HGBUR, BILIRUBINUR, KETONESUR, PROTEINUR, UROBILINOGEN, NITRITE, LEUKOCYTESUR in the last 168 hours.  Invalid input(s): APPERANCEUR  Lipid Panel:  No results found for: CHOL, TRIG, HDL, CHOLHDL, VLDL, LDLCALC  HgbA1C: No results found for: HGBA1C  Urine Drug Screen:     Component Value Date/Time   LABOPIA NONE DETECTED 09/18/2014 1540   COCAINSCRNUR NONE DETECTED 09/18/2014 1540    LABBENZ POSITIVE* 09/18/2014 1540   AMPHETMU NONE DETECTED 09/18/2014 1540   THCU NONE DETECTED 09/18/2014 1540   LABBARB NONE DETECTED 09/18/2014 1540    Alcohol Level: No results for input(s): ETH in the last 168 hours.   Imaging: Dg Chest 2 View  11/14/2014   CLINICAL DATA:  Headache.  Sarcoidosis.  EXAM: CHEST  2 VIEW  COMPARISON:  10/26/2014  FINDINGS: Mediastinal and hilar adenopathy is again evident without significant interval change. The lungs are clear. There are no effusions. Heart size is normal. Pulmonary vasculature is normal.  IMPRESSION: Stable mediastinal and bilateral hilar adenopathy consistent with the described history of sarcoidosis. No acute cardiopulmonary findings.   Electronically Signed   By: Andreas Newport M.D.   On: 11/14/2014 01:51     Assessment/Plan: 49 year old female with sarcoid recently started on Prednisone.  Also with chronic headaches since January.  Doubt that recent worsening of headache was due to Prednisone.  Has no documented evidence of neurosarcoid from review of the record.  MRI without contrast performed in January was unremarkable other than for a skull lesion.  Etiology for this remains unclear.  Has received benefit in the ED from Toradol, Benadryl and Reglan.  Would not expect full abortion of 2 month long headache at this time but is back to severity prior to worsening.      Recommendations: 1.  MRI of the brain with contrast 2.  Would continue Prednisone at $RemoveBefor'40mg'teIScbyBBrAW$  daily.  Dr. Halford Chessman to be made aware 3.  Would not start high dose IV steroids at this time 4.  TSH, ESR 5.  Depacon $Remove'500mg'DfCVKvO$  IV now   Alexis Goodell, MD [facility] [phone] 11/14/2014, 6:51 AM

## 2014-11-14 NOTE — Discharge Instructions (Signed)
Sarcoidosis, Schaumann's Disease, Sarcoid of [place] [name], take headache medication as prescribed and follow-up with her regular physician within 3 days regarding her headache and sarcoidosis. Continue to take steroid treatments as well. If any symptoms worsen come back to the emergency department immediately. Thank you. Sarcoidosis appears briefly and heals naturally in 50 to 70 percent of cases, often without the patient knowing or doing anything about it. 20 to 30 percent of patients with sarcoidosis are left with some permanent lung damage. In 10 to 15 percent of the patients, sarcoidosis can become chronic (long lasting). When either the granulomas or fibrosis seriously affect the function of a vital organ (lungs, heart, nervous system, liver, or kidneys), sarcoidosis can be fatal. This occurs 5 to 10 percent of the time. No one can predict how sarcoidosis will progress in an individual patient. The symptoms the patient experiences, the caregiver's findings, and the patient's race can give some clues. Sarcoidosis was once considered a rare disease. We now know that it is a common chronic illness that appears all over the world. It is the most common of the fibrotic (scarring) lung disorders. Anyone can get sarcoidosis. It occurs in all races and in both sexes. The risk is greater if you are a young black adult, especially a black woman, or are of [place], [place], [place], or [place] origin. In sarcoidosis, small lumps (also called nodules or granulomas) develop in multiple organs of the body. These granulomas are small collections of inflamed cells. They commonly appear in the lungs. This is the most common organ affected. They also occur in the lymph nodes (your glands), skin, liver, and eyes. The granulomas vary in the amount of disease they produce from very little with no problems (symptoms) to causing severe illness. The cause of sarcoidosis is not known. It may be due to an abnormal  immune reaction in the body. Most people will recover. A few people will develop long lasting conditions that may get worse. Women are affected more often than men. The majority of those affected are under 51 years of age. Because we do not know the cause, we do not have ways to prevent it. SYMPTOMS   Fever.  Loss of appetite.  Night sweats.  Joint pain.  Aching muscles Symptoms vary because the disease affects different parts of the body in different people. Most people who see their caregiver with sarcoidosis have lung problems. The first signs are usually a dry cough and shortness of breath. There may also be wheezing, chest pain, or a cough that brings up bloody mucus. In severe cases, lung function may become so poor that the person cannot perform even the simple routine tasks of daily life. Other symptoms of sarcoidosis are less common than lung symptoms. They can include:  Skin symptoms. Sarcoidosis can appear as a collection of tender, red bumps called erythema nodosum. These bumps usually occur on the face, shins, and arms. They can also occur as a scaly, purplish discoloration on the nose, cheeks, and ears. This is called lupus pernio. Less often, sarcoidosis causes cysts, pimples, or disfiguring over growths of skin. In many cases, the disfiguring over growths develop in areas of scars or tattoos.  Eye symptoms. These include redness, eye pain, and sensitivity to light.  Heart symptoms. These include irregular heartbeat and heart failure.  Other symptoms. A person may have paralyzed facial muscles, seizures, psychiatric symptoms, swollen salivary glands, or bone pain. DIAGNOSIS  Even when there are no symptoms, your caregiver  can sometimes pick up signs of sarcoidosis during a routine examination, usually through a chest x-ray or when checking other complaints. The patient's age and race or ethnic group can raise an additional red flag that a sign or symptom could be related to  sarcoidosis.   Enlargement of the salivary or tear glands and cysts in bone tissue may also be caused by sarcoidosis.  You may have had a biopsy done that shows signs of sarcoidosis. A biopsy is a small tissue sample that is removed for laboratory testing. This tissue sample can be taken from your lung, skin, lip, or another inflamed or abnormal area of the body.  You may have had an abnormal chest X-ray. Although you appear healthy, a chest X-ray ordered for other reasons may turn up abnormalities that suggest sarcoidosis.  Other tests may be needed. These tests may be done to rule out other illnesses or to determine the amount of organ damage caused by sarcoidosis. Some of the most common tests are:  Blood levels of calcium or angiotensin-converting enzyme may be high in people with sarcoidosis.  Blood tests to evaluate how well your liver is functioning.  Lung function tests to measure how well you are breathing.  A complete eye examination. TREATMENT  If sarcoidosis does not cause any problems, treatment may not be necessary. Your caregiver may decide to simply monitor your condition. As part of this monitoring process, you may have frequent office visits, follow-up chest X-rays, and tests of your lung function.If you have signs of moderate or severe lung disease, your doctor may recommend:  A corticosteroid drug, such as prednisone (sold under several brand names).  Corticosteroids also are used to treat sarcoidosis of the eyes, joints, skin, nerves, or heart.  Corticosteroid eye drops may be used for the eyes.  Over-the-counter medications like nonsteroidal anti-inflammatory drugs (NSAID) often are used to treat joint pain first before corticosteroids, which tend to have more side effects.  If corticosteroids are not effective or cause serious side effects, other drugs that alter or suppress the immune system may be used.  In rare cases, when sarcoidosis causes life-threatening  lung disease, a lung transplant may be necessary. However, there is some risk that the new lungs also will be attacked by sarcoidosis. SEEK IMMEDIATE MEDICAL CARE IF:   You suffer from shortness of breath or a lingering cough.  You develop new problems that may be related to the disease. Remember this disease can affect almost all organs of the body and cause many different problems. Document Released: 06/17/2004 Document Revised: 11/09/2011 Document Reviewed: 12/13/2013 [facility] Patient Information 2015 [place], [place]. This information is not intended to replace advice given to you by your health care provider. Make sure you discuss any questions you have with your health care provider. General Headache Without Cause A general headache is pain or discomfort felt around the head or neck area. The cause may not be found.  HOME CARE   Keep all doctor visits.  Only take medicines as told by your doctor.  Lie down in a dark, quiet room when you have a headache.  Keep a journal to find out if certain things bring on headaches. For example, write down:  What you eat and drink.  How much sleep you get.  Any change to your diet or medicines.  Relax by getting a massage or doing other relaxing activities.  Put ice or heat packs on the head and neck area as told by your doctor.  Lessen stress.  Sit up straight. Do not tighten (tense) your muscles.  Quit smoking if you smoke.  Lessen how much alcohol you drink.  Lessen how much caffeine you drink, or stop drinking caffeine.  Eat and sleep on a regular schedule.  Get 7 to 9 hours of sleep, or as told by your doctor.  Keep lights dim if bright lights bother you or make your headaches worse. GET HELP RIGHT AWAY IF:   Your headache becomes really bad.  You have a fever.  You have a stiff neck.  You have trouble seeing.  Your muscles are weak, or you lose muscle control.  You lose your balance or have trouble walking.  You  feel like you will pass out (faint), or you pass out.  You have really bad symptoms that are different than your first symptoms.  You have problems with the medicines given to you by your doctor.  Your medicines do not work.  Your headache feels different than the other headaches.  You feel sick to your stomach (nauseous) or throw up (vomit). MAKE SURE YOU:   Understand these instructions.  Will watch your condition.  Will get help right away if you are not doing well or get worse. Document Released: 05/26/2008 Document Revised: 11/09/2011 Document Reviewed: 08/07/2011 [facility] Patient Information 2015 [place], [place]. This information is not intended to replace advice given to you by your health care provider. Make sure you discuss any questions you have with your health care provider.

## 2014-11-14 NOTE — Progress Notes (Signed)
Patient ID: [name], female   DOB: 02/14/1966, 49 y.o.   MRN: [id number]  [facility] PROGRESS NOTE  TATUM CORL [id number] DOB: 1965/09/30 DOA: 11/13/2014 PCP: Chesley Mires, MD   Brief narrative:    49 y.o. female with recently diagnosed sarcoidosis who was started on prednisone one day prior to this admission and now presented to [facility] ED with sudden onset of headache that is mostly located on the left frontal area behind the left eye. Pt reports having headaches for the past 2 months but her headache suddenly got worse over the past 24 hours. Pt reports mildly blurry vision but no other visual symptoms. Pt reports associated nausea and few episodes of non bloody vomiting.   In ED, pt is hemodynamically stable, VSS, headaches improved with IVF, toradol and dilaudid. Neurology team has been consulted for further assistance. [facility] asked to admit for further evaluation.   Assessment/Plan:    Principal Problem:   Headache - appreciate neurology team following - plan for MRI brain with contrast for further evaluation - continue Prednisone 40 mg PO QD - TSH and ERS pending  - Depacon 500 mg IV given per neurology team Active Problems:   Extrinsic asthma - respiratory status stable this AM, oxygen saturations at target range   Sarcoidosis - will notify Dr. Halford Chessman of pt's admission    Hypothyroidism - continue synthroid - TSH pending   DVT prophylaxis: SCD's  Code Status: Full.  Family Communication:  plan of care discussed with the patient Disposition Plan: Home when stable.   IV access:  Peripheral IV  Procedures and diagnostic studies:    CXR 11/14/2014  Stable mediastinal and bilateral hilar adenopathy c/w sarcoidosis. No acute cardiopulmonary findings.   CXR  10/26/2014   Mediastinal and hilar adenopathy is stable.  No new findings.     Medical Consultants:  Neurology   Other Consultants:  None  IAnti-Infectives:   None  Faye Ramsay,  MD  [facility] Pager [phone]  If 7PM-7AM, please contact night-coverage [email or web address] Password [facility] 11/14/2014, 9:26 AM   LOS: 0 days   HPI/Subjective: No events overnight.   Objective: Filed Vitals:   11/14/14 0700 11/14/14 0800 11/14/14 0830 11/14/14 0916  BP: 138/81  139/74 126/75  Pulse: 69  75 81  Temp:   98.3 F (36.8 C) 98.3 F (36.8 C)  TempSrc:   Oral Oral  Resp: 14  18 18   Height:  5\' 7"  (1.702 m)    Weight:  76.658 kg (169 lb)    SpO2: 96%  94% 98%   No intake or output data in the 24 hours ending 11/14/14 0926  Exam:   General:  Pt is alert, follows commands appropriately, not in acute distress  Cardiovascular: Regular rate and rhythm, S1/S2, no murmurs, no rubs, no gallops  Respiratory: Clear to auscultation bilaterally, no wheezing, no crackles, no rhonchi  Abdomen: Soft, non tender, non distended, bowel sounds present, no guarding  Extremities: No edema, pulses DP and PT palpable bilaterally  Neuro: Grossly nonfocal  Data Reviewed: Basic Metabolic Panel:  Recent Labs Lab 11/14/14 0012 11/14/14 0745  NA 138 138  K 4.1 4.1  CL 106 108  CO2 28 25  GLUCOSE 114* 187*  BUN 19 18  CREATININE 0.82 0.84  CALCIUM 9.0 9.1   Liver Function Tests:  Recent Labs Lab 11/14/14 0745  AST 21  ALT 21  ALKPHOS 88  BILITOT 0.4  PROT 6.6  ALBUMIN 4.0   CBC:  Recent Labs  Lab 11/14/14 0012 11/14/14 0745  WBC 8.3 8.3  NEUTROABS 5.4 7.3  HGB 11.9* 12.9  HCT 34.8* 37.6  MCV 85.1 85.1  PLT 218 226   Scheduled Meds: . diazepam  2 mg Oral QHS  . dicyclomine  20 mg Oral Q6H  . levothyroxine  137 mcg Oral QAC breakfast  . pantoprazole  40 mg Oral Daily  . PARoxetine  20 mg Oral QHS  . predniSONE  40 mg Oral Q breakfast  . rOPINIRole  2 mg Oral QHS  . traZODone  50 mg Oral QHS  . valACYclovir  500 mg Oral QHS  . valproate sodium  500 mg Intravenous Once   Continuous Infusions: . sodium chloride 100 mL/hr at 11/14/14 0735

## 2014-11-14 NOTE — Progress Notes (Signed)
UR complete.    RN, MSN 

## 2014-11-14 NOTE — Progress Notes (Signed)
Pt arrived to unit per stretcher from ED with ED nurse tech. No acute distress noted.  Assessment performed as charted.  Will monitor pt closely   [name] I 11/14/2014 11:46 AM

## 2014-11-14 NOTE — H&P (Signed)
[facility] History and Physical  [name] KYH:[id number] DOB: [birth year] DOA: 11/13/2014  Referring physician: ER physician. PCP: Chesley Mires, MD   Chief Complaint: Headache.  HPI: [name] is a 49 y.o. female with history of recently diagnosed sarcoidosis who was started on prednisone yesterday presents to the ER because of worsening headache. Patient was admitted in January of this year for left-sided weakness and at that time patient had nonspecific lesion in the MRI of the brain. Patient had recent mediastinoscopy and had biopsy done which showed granulomatous lesion. Patient has been having headache for last 2 months which acutely worsened last 2 days. Pain is mostly on the left frontal behind the left eye and at times tearing. Denies any photophobia or loss of consciousness or any focal deficits at this time. Patient attends get nauseated. Patient had some leftover Dilaudid which he tried despite which the pain did not get improved and presented to the ER. In the ER patient was given Reglan Toradol and 1 dose of IV Solu-Medrol for which patient's pain improved but started recurring. At this time neurologist on call Dr. Doy Mince was consulted and patient will be admitted for further management.   Review of Systems: As presented in the history of presenting illness, rest negative.  Past Medical History  Diagnosis Date  . IC (interstitial cystitis)   . RLS (restless legs syndrome)   . GERD (gastroesophageal reflux disease)   . Depression   . Hypothyroidism   . Environmental allergies   . IBS (irritable bowel syndrome)   . Frequency of urination   . Nocturia   . SUI (stress urinary incontinence, female)   . Erosion of suburethral sling   . Herpes simplex type 2 infection   . NSVD (normal spontaneous vaginal delivery)     x3  . Asthma   . Heart murmur     "when I was a baby"  . Chronic bronchitis     "get it close to q yr" (09/18/2014)  . Migraine     "maybe  once/month" (09/18/2014)  . Headache     "at least several times/wk" (09/18/2014)  . Seizures     "when I was a teenager"  . Arthritis     "back, hands, neck" (09/18/2014)  . Chronic back pain     "whole spine"  . Anxiety   . Shortness of breath dyspnea     Exertion  . Sarcoid    Past Surgical History  Procedure Laterality Date  . Cesarean section  2000  . Pelvic laparoscopy  1990's    LYSIS ADHESIONS  . Tonsillectomy  1986  . Appendectomy  1985  . Anterior cervical decomp/discectomy fusion  2011  . Abdominal hysterectomy  2006  . Knee arthroscopy Right   . Lumbar fusion  12-04-2008    L4  -- S1  . Lumbar disc surgery  02-02-2007    RIGHT SIDE L5 -- S1  . Lynx retropubic suburethral sling  03-02-2007  . Back surgery  2008  . Tubal ligation  2001    hulka clip  . Pubovaginal sling N/A 11/30/2012    Procedure: Gaynelle Arabian;  Surgeon: Bernestine Amass, MD;  [facility];  Service: Urology;  Laterality: N/A;  1 HR EXAM UNDER ANESTHESIA, EXCISION OF SUB URETHRAL MESH, CYSTO, HOD   . Cysto with hydrodistension N/A 11/30/2012    Procedure: CYSTOSCOPY/HYDRODISTENSION;  Surgeon: Bernestine Amass, MD;  Location: [facility];  Service: Urology;  Laterality:  N/A;  . Video bronchoscopy with endobronchial ultrasound N/A 09/21/2014    Procedure: VIDEO BRONCHOSCOPY WITH ENDOBRONCHIAL ULTRASOUND;  Surgeon: Collene Gobble, MD;  Location: [place];  Service: Thoracic;  Laterality: N/A;  . Mediastinoscopy N/A 10/26/2014    Procedure: MEDIASTINOSCOPY;  Surgeon: Melrose Nakayama, MD;  Location: [place];  Service: Thoracic;  Laterality: N/A;   Social History:  reports that she quit smoking about 9 months ago. Her smoking use included Cigarettes. She has a 7.5 pack-year smoking history. She has never used smokeless tobacco. She reports that she drinks alcohol. She reports that she does not use illicit drugs. Where does patient live home. Can patient participate in  ADLs? Yes.  Allergies  Allergen Reactions  . Bactrim [Sulfamethoxazole-Trimethoprim] Other (See Comments)    Fever   . Percocet [Oxycodone-Acetaminophen] Itching  . Vicodin [Hydrocodone-Acetaminophen] Itching    Family History:  Family History  Problem Relation Age of Onset  . Diabetes Mother   . Hypertension Mother   . Heart disease Mother   . Cancer Father     LUNG AND BRAIN  . Hypertension Father   . Heart disease Maternal Grandmother   . Cancer Maternal Grandfather     LUNG  . Heart disease Paternal Grandmother   . Cancer Paternal Grandfather       Prior to Admission medications   Medication Sig Start Date End Date Taking? Authorizing Provider  albuterol (PROVENTIL HFA;VENTOLIN HFA) 108 (90 BASE) MCG/ACT inhaler Inhale 2 puffs into the lungs every 6 (six) hours as needed. For shortness of breath 04/25/13  Yes Elayne Snare, MD  diazepam (VALIUM) 2 MG tablet Take 2 mg by mouth at bedtime.   Yes Historical Provider, MD  dicyclomine (BENTYL) 20 MG tablet Take 20 mg by mouth every 6 (six) hours.   Yes Historical Provider, MD  HYDROmorphone (DILAUDID) 2 MG tablet Take 1 tablet (2 mg total) by mouth every 4 (four) hours as needed for moderate pain or severe pain. 10/26/14  Yes Melrose Nakayama, MD  levothyroxine (SYNTHROID, LEVOTHROID) 137 MCG tablet Take 137 mcg by mouth every morning.    Yes Historical Provider, MD  meloxicam (MOBIC) 7.5 MG tablet Take 7.5 mg by mouth 2 (two) times daily as needed. 06/27/14  Yes Historical Provider, MD  naproxen sodium (ANAPROX) 220 MG tablet Take 220 mg by mouth 2 (two) times daily with a meal.   Yes Historical Provider, MD  omeprazole (PRILOSEC) 40 MG capsule Take 40 mg by mouth at bedtime.   Yes Historical Provider, MD  PARoxetine (PAXIL) 20 MG tablet Take 20 mg by mouth at bedtime.   Yes Historical Provider, MD  predniSONE (DELTASONE) 20 MG tablet Take 2 tablets (40 mg total) by mouth daily with breakfast. 11/12/14  Yes Chesley Mires, MD   rOPINIRole (REQUIP) 2 MG tablet Take 2 mg by mouth at bedtime.   Yes Historical Provider, MD  traMADol (ULTRAM) 50 MG tablet Take 50 mg by mouth every 6 (six) hours as needed. 10/29/14  Yes Historical Provider, MD  traZODone (DESYREL) 50 MG tablet Take 50 mg by mouth at bedtime.   Yes Historical Provider, MD  valACYclovir (VALTREX) 500 MG tablet Take 500 mg by mouth at bedtime.   Yes Historical Provider, MD  metoCLOPramide (REGLAN) 10 MG tablet Take 1 tablet (10 mg total) by mouth every 8 (eight) hours as needed (headache). 11/14/14   Everlene Balls, MD    Physical Exam: Filed Vitals:   11/14/14 0400 11/14/14 0430 11/14/14  0500 11/14/14 0530  BP: 124/76 135/84 147/86 138/83  Pulse: 72 71 70 79  Temp:      TempSrc:      Resp: 12 12 10 13   SpO2: 94% 91% 95% 92%     General:  Well-developed and nourished.  Eyes: Anicteric no pallor.  ENT: No discharge from the ears eyes nose or mouth.  Neck: No neck rigidity. No mass felt.  Cardiovascular: S1-S2 heard.  Respiratory: No rhonchi or crepitations.  Abdomen: Soft nontender bowel sounds present.  Skin: No rash.  Musculoskeletal: No edema.  Psychiatric: Appears normal.  Neurologic: Alert awake oriented to time place and person. Moves all extremities 5 x 5. No facial asymmetry. Tongue is midline. PERRLA positive.  Labs on Admission:  Basic Metabolic Panel:  Recent Labs Lab 11/14/14 0012  NA 138  K 4.1  CL 106  CO2 28  GLUCOSE 114*  BUN 19  CREATININE 0.82  CALCIUM 9.0   Liver Function Tests: No results for input(s): AST, ALT, ALKPHOS, BILITOT, PROT, ALBUMIN in the last 168 hours. No results for input(s): LIPASE, AMYLASE in the last 168 hours. No results for input(s): AMMONIA in the last 168 hours. CBC:  Recent Labs Lab 11/14/14 0012  WBC 8.3  NEUTROABS 5.4  HGB 11.9*  HCT 34.8*  MCV 85.1  PLT 218   Cardiac Enzymes: No results for input(s): CKTOTAL, CKMB, CKMBINDEX, TROPONINI in the last 168 hours.  BNP  (last 3 results)  Recent Labs  10/03/14 0835  BNP 38.8    ProBNP (last 3 results) No results for input(s): PROBNP in the last 8760 hours.  CBG: No results for input(s): GLUCAP in the last 168 hours.  Radiological Exams on Admission: Dg Chest 2 View  11/14/2014   CLINICAL DATA:  Headache.  Sarcoidosis.  EXAM: CHEST  2 VIEW  COMPARISON:  10/26/2014  FINDINGS: Mediastinal and hilar adenopathy is again evident without significant interval change. The lungs are clear. There are no effusions. Heart size is normal. Pulmonary vasculature is normal.  IMPRESSION: Stable mediastinal and bilateral hilar adenopathy consistent with the described history of sarcoidosis. No acute cardiopulmonary findings.   Electronically Signed   By: Andreas Newport M.D.   On: 11/14/2014 01:51     Assessment/Plan Principal Problem:   Headache Active Problems:   Extrinsic asthma   Sarcoidosis   1. Headache - at this time we will continue with when necessary Toradol and for severe pain on Dilaudid. Gentle hydration and neurology to consult. 2. Sarcoidosis - continue prednisone. 3. History of asthma present in all wheezing - continue inhalers as needed. 4. Restless leg - continue present medications. 5. Hypothyroidism - continue Synthroid.   DVT Prophylaxis SCDs.  Code Status: Full code.  Family Communication: None.  Disposition Plan: Admit to inpatient.    , N. [facility] Pager [phone].  If 7PM-7AM, please contact night-coverage [email or web address] Password [facility] 11/14/2014, 6:37 AM

## 2014-11-15 MED ORDER — KETOROLAC TROMETHAMINE 30 MG/ML IJ SOLN: 30.0000 mg | Freq: Four times a day (QID) | INTRAMUSCULAR | Status: DC | PRN

## 2014-11-15 MED ORDER — TOPIRAMATE 25 MG PO TABS: 25.0000 mg | ORAL_TABLET | Freq: Every day | ORAL | Status: DC

## 2014-11-15 NOTE — Progress Notes (Signed)
Subjective: patient resting comfortably in bed stating she has a 7/10 HA.   Objective: Current vital signs: BP 115/72 mmHg  Pulse 83  Temp(Src) 98 F (36.7 C) (Oral)  Resp 18  Ht 5' 7" (1.702 m)  Wt 76.658 kg (169 lb)  BMI 26.46 kg/m2  SpO2 97%  LMP 06/03/2012 Vital signs in last 24 hours: Temp:  [98 F (36.7 C)-98.3 F (36.8 C)] 98 F (36.7 C) (03/17 0904) Pulse Rate:  [71-92] 83 (03/17 0904) Resp:  [18] 18 (03/17 0904) BP: (115-142)/(72-80) 115/72 mmHg (03/17 0904) SpO2:  [95 %-97 %] 97 % (03/17 0904)  Intake/Output from previous day: 03/16 0701 - 03/17 0700 In: 350 [P.O.:300; IV Piggyback:50] Out: -  Intake/Output this shift:   Nutritional status: Diet regular  Neurologic Exam: General: Mental Status: Alert, oriented, thought content appropriate.  Speech fluent without evidence of aphasia.  Able to follow 3 step commands without difficulty. Cranial Nerves: II:  Visual fields grossly normal, pupils equal, round, reactive to light and accommodation III,IV, VI: ptosis not present, extra-ocular motions intact bilaterally V,VII: smile symmetric, facial light touch sensation normal bilaterally VIII: hearing normal bilaterally IX,X: uvula rises symmetrically XI: bilateral shoulder shrug XII: midline tongue extension without atrophy or fasciculations  Motor: Right : Upper extremity   5/5    Left:     Upper extremity   5/5  Lower extremity   5/5     Lower extremity   5/5 Tone and bulk:normal tone throughout; no atrophy noted Sensory: Pinprick and light touch intact throughout, bilaterally Deep Tendon Reflexes:  Right: Upper Extremity   Left: Upper extremity   biceps (C-5 to C-6) 2/4   biceps (C-5 to C-6) 2/4 tricep (C7) 2/4    triceps (C7) 2/4 Brachioradialis (C6) 2/4  Brachioradialis (C6) 2/4  Lower Extremity Lower Extremity  quadriceps (L-2 to L-4) 2/4   quadriceps (L-2 to L-4) 2/4 Achilles (S1) 2/4   Achilles (S1) 2/4  Plantars: Right: downgoing   Left:  downgoing    Lab Results: Basic Metabolic Panel:  Recent Labs Lab 11/14/14 0012 11/14/14 0745  NA 138 138  K 4.1 4.1  CL 106 108  CO2 28 25  GLUCOSE 114* 187*  BUN 19 18  CREATININE 0.82 0.84  CALCIUM 9.0 9.1    Liver Function Tests:  Recent Labs Lab 11/14/14 0745  AST 21  ALT 21  ALKPHOS 88  BILITOT 0.4  PROT 6.6  ALBUMIN 4.0   No results for input(s): LIPASE, AMYLASE in the last 168 hours. No results for input(s): AMMONIA in the last 168 hours.  CBC:  Recent Labs Lab 11/14/14 0012 11/14/14 0745  WBC 8.3 8.3  NEUTROABS 5.4 7.3  HGB 11.9* 12.9  HCT 34.8* 37.6  MCV 85.1 85.1  PLT 218 226    Cardiac Enzymes: No results for input(s): CKTOTAL, CKMB, CKMBINDEX, TROPONINI in the last 168 hours.  Lipid Panel: No results for input(s): CHOL, TRIG, HDL, CHOLHDL, VLDL, LDLCALC in the last 168 hours.  CBG: No results for input(s): GLUCAP in the last 168 hours.  Microbiology: Results for orders placed or performed during the hospital encounter of 10/26/14  Surgical pcr screen     Status: None   Collection Time: 10/26/14 12:07 PM  Result Value Ref Range Status   MRSA, PCR NEGATIVE NEGATIVE Final   Staphylococcus aureus NEGATIVE NEGATIVE Final    Comment:        The Xpert SA Assay (FDA approved for NASAL specimens in patients over  48 years of age), is one component of a comprehensive surveillance program.  Test performance has been validated by [facility] for patients greater than or equal to 45 year old. It is not intended to diagnose infection nor to guide or monitor treatment.   Fungus Culture with Smear     Status: None (Preliminary result)   Collection Time: 10/26/14  2:03 PM  Result Value Ref Range Status   Specimen Description TISSUE LYMPH NODE  Final   Special Requests NONE  Final   Fungal Smear   Final    NO YEAST OR FUNGAL ELEMENTS SEEN Performed at [facility]    Culture   Final    CULTURE IN PROGRESS FOR FOUR  WEEKS Performed at [facility]    Report Status PENDING  Incomplete  AFB culture with smear     Status: None (Preliminary result)   Collection Time: 10/26/14  2:03 PM  Result Value Ref Range Status   Specimen Description TISSUE LYMPH NODE  Final   Special Requests NONE  Final   Acid Fast Smear   Final    NO ACID FAST BACILLI SEEN Performed at [facility]    Culture   Final    CULTURE WILL BE EXAMINED FOR 6 WEEKS BEFORE ISSUING A FINAL REPORT Performed at [facility]    Report Status PENDING  Incomplete    Coagulation Studies: No results for input(s): LABPROT, INR in the last 72 hours.  Imaging: Dg Chest 2 View  11/14/2014   CLINICAL DATA:  Headache.  Sarcoidosis.  EXAM: CHEST  2 VIEW  COMPARISON:  10/26/2014  FINDINGS: Mediastinal and hilar adenopathy is again evident without significant interval change. The lungs are clear. There are no effusions. Heart size is normal. Pulmonary vasculature is normal.  IMPRESSION: Stable mediastinal and bilateral hilar adenopathy consistent with the described history of sarcoidosis. No acute cardiopulmonary findings.   Electronically Signed   By: Andreas Newport M.D.   On: 11/14/2014 01:51   Mr Jeri Cos WU Contrast  11/14/2014   CLINICAL DATA:  Chronic intractable headache  EXAM: MRI HEAD WITHOUT AND WITH CONTRAST  TECHNIQUE: Multiplanar, multiecho pulse sequences of the brain and surrounding structures were obtained without and with intravenous contrast.  CONTRAST:  41m MULTIHANCE GADOBENATE DIMEGLUMINE 529 MG/ML IV SOLN  COMPARISON:  MRI 09/18/2014, CT 09/18/2014  FINDINGS: Ventricle size is normal. Cerebral volume is normal. Pituitary normal in size. Craniocervical junction is normal.  Negative for acute infarct  Several small white matter hyperintensities in the deep white matter bilaterally are stable from the prior study. Brainstem is normal.  Negative for intracranial hemorrhage.  Negative for mass lesion in the brain.   There is a lesion in the left frontal bone measuring approximately 10 x 15 mm. This is unchanged from the recent CT and MRI. This demonstrates intermediate signal on T1 and T2 and shows diffuse enhancement. The margins are ill-defined. This lesion is concerning for metastatic disease and biopsy is recommended. No other skull lesions. The inner table appears disrupted.  IMPRESSION: Mild chronic microvascular ischemic changes are stable. No acute infarct. No change from the prior MRI.  10 x 15 mm enhancing lesion left frontal bone unchanged from prior studies and suspicious for metastatic disease. Biopsy recommended-.   Electronically Signed   By: CFranchot GalloM.D.   On: 11/14/2014 17:43    Medications:  Scheduled: . diazepam  2 mg Oral QHS  . dicyclomine  20 mg Oral  Q6H  . levothyroxine  137 mcg Oral QAC breakfast  . pantoprazole  40 mg Oral Daily  . PARoxetine  20 mg Oral QHS  . predniSONE  40 mg Oral Q breakfast  . rOPINIRole  2 mg Oral QHS  . traZODone  50 mg Oral QHS  . valACYclovir  500 mg Oral QHS    Assessment/Plan: 49 YO female with chronic HA.  MRI brain shows 10,49m enhancing lesion in left frontal bone unchanged from prior study, no acute process, TSH and ESR WNL.  Per patient she lives with a 6/10 HA at home and has been on Topamax and Elavil in past but had no relief with her HA.   Recommend: 1) Decrease narcotics for HA.  2) start topamax 242mqhs for headache ppx   DaEtta QuillA-C [facility] [phone]/17/2016, 9:54 AM   Patient seen and evaluated. Agree with above assessment and plan. Headache is down to a 6/10 and patient appears overall comfortable. As headache has been ongoing for > 2 months do not think we will be able to fully resolve headache at this treatment. Suggest starting topamax 2565mhs for headache prophylaxis. Unclear etiology of skull lesion on MRI. Appears stable from prior MRI. Pan CT scan and Bone scan did not reveal a source.  Question if possibly related to sarcoidosis. Can discuss possible biopsy with neurosurgery though based on stability of lesion it may be prudent to monitor at this time.   Patient will need outpatient neurology follow up for further headache management. No further inpatient neurological workup indicated at this time.  PetJim LikeO [facility] [phone] 7pm- 7am, please page neurology on call as listed in [facility]

## 2014-11-15 NOTE — Progress Notes (Signed)
Patient ID: [name], female   DOB: [birth year], 49 y.o.   MRN: [id number]  [facility] PROGRESS NOTE  PATRISIA FAETH UUV:[id number] DOB: 1965-09-13 DOA: 11/13/2014 PCP: Chesley Mires, MD   Brief narrative:    49 y.o. female with recently diagnosed sarcoidosis who was started on prednisone one day prior to this admission and now presented to [facility] ED with sudden onset of headache that is mostly located on the left frontal area behind the left eye. Pt reports having headaches for the past 2 months but her headache suddenly got worse over the past 24 hours. Pt reports mildly blurry vision but no other visual symptoms. Pt reports associated nausea and few episodes of non bloody vomiting.   In ED, pt is hemodynamically stable, VSS, headaches improved with IVF, toradol and dilaudid. Neurology team has been consulted for further assistance. [facility] asked to admit for further evaluation.   Assessment/Plan:    Principal Problem:   Headache - appreciate neurology team following - MRI with no acute process to explain worsening headaches - continue Prednisone 40 mg PO QD - started topomax per neurology  - decrease narcotic use  Active Problems:   Extrinsic asthma - respiratory status stable this AM, oxygen saturations at target range   Sarcoidosis - notified Dr. Halford Chessman of pt's admission    Hypothyroidism - continue synthroid  DVT prophylaxis: SCD's  Code Status: Full.  Family Communication:  plan of care discussed with the patient Disposition Plan: Home in AM  IV access:  Peripheral IV  Procedures and diagnostic studies:    CXR 11/14/2014  Stable mediastinal and bilateral hilar adenopathy c/w sarcoidosis. No acute cardiopulmonary findings.   CXR  10/26/2014   Mediastinal and hilar adenopathy is stable.  No new findings.     Medical Consultants:  Neurology   Other Consultants:  None  IAnti-Infectives:   None  Faye Ramsay, MD  [facility] Pager [phone]  If 7PM-7AM, please  contact night-coverage [email or web address] Password [facility] 11/15/2014, 12:52 PM   LOS: 1 day   HPI/Subjective: No events overnight.   Objective: Filed Vitals:   11/14/14 2100 11/15/14 0100 11/15/14 0600 11/15/14 0904  BP: 131/77 122/80 130/78 115/72  Pulse: 81 89 79 83  Temp: 98.3 F (36.8 C) 98.1 F (36.7 C) 98.1 F (36.7 C) 98 F (36.7 C)  TempSrc: Oral Oral Oral Oral  Resp: 18 18 18 18   Height:      Weight:      SpO2: 95% 95% 95% 97%    Intake/Output Summary (Last 24 hours) at 11/15/14 1252 Last data filed at 11/15/14 0830  Gross per 24 hour  Intake    240 ml  Output      0 ml  Net    240 ml    Exam:   General:  Pt is alert, follows commands appropriately, not in acute distress  Cardiovascular: Regular rate and rhythm, S1/S2, no murmurs, no rubs, no gallops  Respiratory: Clear to auscultation bilaterally, no wheezing, no crackles, no rhonchi  Abdomen: Soft, non tender, non distended, bowel sounds present, no guarding  Extremities: No edema, pulses DP and PT palpable bilaterally  Neuro: Grossly nonfocal  Data Reviewed: Basic Metabolic Panel:  Recent Labs Lab 11/14/14 0012 11/14/14 0745  NA 138 138  K 4.1 4.1  CL 106 108  CO2 28 25  GLUCOSE 114* 187*  BUN 19 18  CREATININE 0.82 0.84  CALCIUM 9.0 9.1   Liver Function Tests:  Recent Labs Lab 11/14/14 0745  AST 21  ALT 21  ALKPHOS 88  BILITOT 0.4  PROT 6.6  ALBUMIN 4.0   CBC:  Recent Labs Lab 11/14/14 0012 11/14/14 0745  WBC 8.3 8.3  NEUTROABS 5.4 7.3  HGB 11.9* 12.9  HCT 34.8* 37.6  MCV 85.1 85.1  PLT 218 226   Scheduled Meds: . diazepam  2 mg Oral QHS  . dicyclomine  20 mg Oral Q6H  . levothyroxine  137 mcg Oral QAC breakfast  . pantoprazole  40 mg Oral Daily  . PARoxetine  20 mg Oral QHS  . predniSONE  40 mg Oral Q breakfast  . rOPINIRole  2 mg Oral QHS  . traZODone  50 mg Oral QHS  . valACYclovir  500 mg Oral QHS   Continuous Infusions:

## 2014-11-16 MED ORDER — LORAZEPAM 1 MG PO TABS: 1.0000 mg | ORAL_TABLET | Freq: Two times a day (BID) | ORAL | Status: DC | PRN

## 2014-11-16 MED ORDER — TRAMADOL HCL 50 MG PO TABS: 50.0000 mg | ORAL_TABLET | Freq: Four times a day (QID) | ORAL | Status: DC | PRN

## 2014-11-16 MED ORDER — HYDROMORPHONE HCL 4 MG PO TABS: 4.0000 mg | ORAL_TABLET | ORAL | Status: DC | PRN

## 2014-11-16 NOTE — Progress Notes (Signed)
Reviewed discharge instructions and education with patient and spouse. Both verbalized understanding .3 Rx given. Patient was escorted to the front lobby by nurse tech and spouse provided transportation home.

## 2014-11-16 NOTE — Discharge Summary (Signed)
Physician Discharge Summary  [name]:[id number] DOB: Jul 19, 1966 DOA: 11/13/2014  PCP: Chesley Mires, MD  Admit date: 11/13/2014 Discharge date: 11/16/2014  Recommendations for Outpatient Follow-up:  1. Pt will need to follow up with PCP in 2-3 weeks post discharge 2. Pt made aware that brain lesion has been stable and that neurosurgeon on call recommended close observation and holding off on biopsy at this time. This to be discussed further with PCP per pt's request.   Discharge Diagnoses:  Principal Problem:   Headache Active Problems:   Extrinsic asthma   Sarcoidosis    Discharge Condition: Stable  Diet recommendation: Heart healthy diet discussed in details     Brief narrative:    49 y.o. female with recently diagnosed sarcoidosis who was started on prednisone one day prior to this admission and now presented to [facility] ED with sudden onset of headache that is mostly located on the left frontal area behind the left eye. Pt reports having headaches for the past 2 months but her headache suddenly got worse over the past 24 hours. Pt reports mildly blurry vision but no other visual symptoms. Pt reports associated nausea and few episodes of non bloody vomiting.   In ED, pt is hemodynamically stable, VSS, headaches improved with IVF, toradol and dilaudid. Neurology team has been consulted for further assistance. TR[facility] asked to admit for further evaluation.   Assessment/Plan:    Principal Problem:  Headache - appreciate neurology team following - MRI with no acute process to explain worsening headaches - continue Prednisone 40 mg PO QD - d/w pt brain lesion and potential need for biopsy in near future, she wants to discuss with PCP Active Problems:  Extrinsic asthma - respiratory status stable this AM, oxygen saturations at target range  Sarcoidosis - follows with Dr. Halford Chessman   Hypothyroidism - continue synthroid  Code Status: Full.  Family Communication:  plan of care discussed with the patient Disposition Plan: Home   IV access:  Peripheral IV  Procedures and diagnostic studies:   CXR 11/14/2014 Stable mediastinal and bilateral hilar adenopathy c/w sarcoidosis. No acute cardiopulmonary findings.  CXR 10/26/2014 Mediastinal and hilar adenopathy is stable. No new findings.   Medical Consultants:  Neurology   Other Consultants:  None  IAnti-Infectives:   None      Discharge Exam: Filed Vitals:   11/16/14 0300  BP: 153/84  Pulse: 60  Temp: 98.8 F (37.1 C)  Resp: 18   Filed Vitals:   11/15/14 1747 11/15/14 2047 11/15/14 2300 11/16/14 0300  BP: 144/89 149/83 136/78 153/84  Pulse: 72 61 73 60  Temp: 98.4 F (36.9 C) 99 F (37.2 C) 98.4 F (36.9 C) 98.8 F (37.1 C)  TempSrc: Oral Oral Oral Oral  Resp: 18 20 18 18   Height:      Weight:      SpO2: 97% 94% 98% 97%    General: Pt is alert, follows commands appropriately, not in acute distress Cardiovascular: Regular rate and rhythm, S1/S2 +, no murmurs, no rubs, no gallops Respiratory: Clear to auscultation bilaterally, no wheezing, no crackles, no rhonchi Abdominal: Soft, non tender, non distended, bowel sounds +, no guarding Extremities: no edema, no cyanosis, pulses palpable bilaterally DP and PT Neuro: Grossly nonfocal  Discharge Instructions  Discharge Instructions    Diet - low sodium heart healthy    Complete by:  As directed      Increase activity slowly    Complete by:  As directed  Medication List    TAKE these medications        albuterol 108 (90 BASE) MCG/ACT inhaler  Commonly known as:  PROVENTIL HFA;VENTOLIN HFA  Inhale 2 puffs into the lungs every 6 (six) hours as needed. For shortness of breath     diazepam 2 MG tablet  Commonly known as:  VALIUM  Take 2 mg by mouth at bedtime.     dicyclomine 20 MG tablet  Commonly known as:  BENTYL  Take 20 mg by mouth every 6 (six) hours.     HYDROmorphone 4 MG  tablet  Commonly known as:  DILAUDID  Take 1 tablet (4 mg total) by mouth every 4 (four) hours as needed for severe pain.     levothyroxine 137 MCG tablet  Commonly known as:  SYNTHROID, LEVOTHROID  Take 137 mcg by mouth every morning.     LORazepam 1 MG tablet  Commonly known as:  ATIVAN  Take 1 tablet (1 mg total) by mouth 3 times/day as needed-between meals & bedtime for anxiety.     meloxicam 7.5 MG tablet  Commonly known as:  MOBIC  Take 7.5 mg by mouth 2 (two) times daily as needed.     naproxen sodium 220 MG tablet  Commonly known as:  ANAPROX  Take 220 mg by mouth 2 (two) times daily with a meal.     omeprazole 40 MG capsule  Commonly known as:  PRILOSEC  Take 40 mg by mouth at bedtime.     PARoxetine 20 MG tablet  Commonly known as:  PAXIL  Take 20 mg by mouth at bedtime.     predniSONE 20 MG tablet  Commonly known as:  DELTASONE  Take 2 tablets (40 mg total) by mouth daily with breakfast.     rOPINIRole 2 MG tablet  Commonly known as:  REQUIP  Take 2 mg by mouth at bedtime.     traMADol 50 MG tablet  Commonly known as:  ULTRAM  Take 1 tablet (50 mg total) by mouth every 6 (six) hours as needed.     traZODone 50 MG tablet  Commonly known as:  DESYREL  Take 50 mg by mouth at bedtime.     valACYclovir 500 MG tablet  Commonly known as:  VALTREX  Take 500 mg by mouth at bedtime.           Follow-up Information    Follow up with SOOD,VINEET, MD. Schedule an appointment as soon as possible for a visit in 3 days.   Specialty:  Pulmonary Disease   Why:  for close follow up of your headache   Contact information:   [address] [place] [address] [phone]        The results of significant diagnostics from this hospitalization (including imaging, microbiology, ancillary and laboratory) are listed below for reference.     Microbiology: No results found for this or any previous visit (from the past 240 hour(s)).   Labs: Basic Metabolic  Panel:  Recent Labs Lab 11/14/14 0012 11/14/14 0745  NA 138 138  K 4.1 4.1  CL 106 108  CO2 28 25  GLUCOSE 114* 187*  BUN 19 18  CREATININE 0.82 0.84  CALCIUM 9.0 9.1   Liver Function Tests:  Recent Labs Lab 11/14/14 0745  AST 21  ALT 21  ALKPHOS 88  BILITOT 0.4  PROT 6.6  ALBUMIN 4.0   No results for input(s): LIPASE, AMYLASE in the last 168 hours. No results for  input(s): AMMONIA in the last 168 hours. CBC:  Recent Labs Lab 11/14/14 0012 11/14/14 0745  WBC 8.3 8.3  NEUTROABS 5.4 7.3  HGB 11.9* 12.9  HCT 34.8* 37.6  MCV 85.1 85.1  PLT 218 226   Cardiac Enzymes: No results for input(s): CKTOTAL, CKMB, CKMBINDEX, TROPONINI in the last 168 hours. BNP: BNP (last 3 results)  Recent Labs  10/03/14 0835  BNP 38.8   SIGNED: Time coordinating discharge: Over 30 minutes  Faye Ramsay, MD  [facility] 11/16/2014, 7:14 AM Pager [phone]  If 7PM-7AM, please contact night-coverage [email or web address] Password [email or web address]

## 2014-11-18 ENCOUNTER — Encounter: Payer: Self-pay | Admitting: Pulmonary Disease

## 2014-11-19 ENCOUNTER — Telehealth: Payer: Self-pay | Admitting: Pulmonary Disease

## 2014-11-19 NOTE — Telephone Encounter (Signed)
Called and spoke to pt. Informed pt of the recs per VS. Pt verbalized understanding and denied any further questions or concerns at this time.

## 2014-11-19 NOTE — Telephone Encounter (Signed)
Per 3.20.16 mychart message from pt: Message     Dr. Halford Chessman, I was released from the hospital on Friday, it's Sunday now and I am still having severe head pain on the left side of my head with pressure in my left eye, No one seems very concerned about this and I'm very scared. What can be done?     Thanks [employer]    Pt called the office on 3.21.16 and received recommendations from VS Nothing further needed on this mychart message Will sign off

## 2014-11-19 NOTE — Telephone Encounter (Signed)
Called and spoke to pt. Pt stated since starting on prednisone she has had a horrible headache and elevated BP. Pt was hospitalized on 3/15 on Neuro unit. Pt requesting recs by VS because she feels she cannot continue the pred. Pt denies change in SOB and cough. Pt stated she is having the crushing CP but states she has had this before and is directly related to the sarcoid. Pt stated the PO Dilaudid helps but not much.   VS please advise.

## 2014-11-19 NOTE — Telephone Encounter (Signed)
Have her change prednisone to 20 mg per day.  If her headache persists, then she needs to call her PCP or neurology >> she was seen by neurology in hospital.

## 2014-11-22 LAB — FUNGUS CULTURE W SMEAR: Fungal Smear: NONE SEEN

## 2014-11-28 ENCOUNTER — Other Ambulatory Visit (HOSPITAL_COMMUNITY): Payer: Self-pay | Admitting: Neurosurgery

## 2014-11-28 DIAGNOSIS — M898X8 Other specified disorders of bone, other site: Secondary | ICD-10-CM

## 2014-11-29 ENCOUNTER — Encounter (HOSPITAL_COMMUNITY): Payer: Self-pay | Admitting: Pharmacy Technician

## 2014-12-01 NOTE — Pre-Procedure Instructions (Signed)
[name]  12/01/2014   Your procedure is scheduled on:  April 6  Report to [facility] Admitting at 1:30 PM.  Call this number if you have problems the morning of surgery: [phone]   Remember:   Do not eat food or drink liquids after midnight.   Take these medicines the morning of surgery with A SIP OF WATER: Albuterol (if needed), bentyl, Hydromorphone (if needed), Levothyroxine, Lorazepam (if needed), Omeprazole, Paxil, Prednisone, Tramadol (if needed)   STOP naproxen and meloxicam today   STOP/ Do not take Aspirin, Aleve, Naproxen, Advil, Ibuprofen, Motrin, Vitamins, Herbs, or Supplements starting today     Do not wear jewelry, make-up or nail polish.  Do not wear lotions, powders, or perfumes. You may wear deodorant.  Do not shave 48 hours prior to surgery. Men may shave face and neck.  Do not bring valuables to the hospital.  [facility] is not responsible for any belongings or valuables.               Contacts, dentures or bridgework may not be worn into surgery.  Leave suitcase in the car. After surgery it may be brought to your room.  For patients admitted to the hospital, discharge time is determined by your treatment team.                 Special Instructions: [place] - Preparing for Surgery  Before surgery, you can play an important role.  Because skin is not sterile, your skin needs to be as free of germs as possible.  You can reduce the number of germs on you skin by washing with CHG (chlorahexidine gluconate) soap before surgery.  CHG is an antiseptic cleaner which kills germs and bonds with the skin to continue killing germs even after washing.  Please DO NOT use if you have an allergy to CHG or antibacterial soaps.  If your skin becomes reddened/irritated stop using the CHG and inform your nurse when you arrive at Short Stay.  Do not shave (including legs and underarms) for at least 48 hours prior to the first CHG shower.  You may shave your  face.  Please follow these instructions carefully:   1.  Shower with CHG Soap the night before surgery and the morning of Surgery.  2.  If you choose to wash your hair, wash your hair first as usual with your normal shampoo.  3.  After you shampoo, rinse your hair and body thoroughly to remove the shampoo.  4.  Use CHG as you would any other liquid soap.  You can apply CHG directly to the skin and wash gently with scrungie or a clean washcloth.  5.  Apply the CHG Soap to your body ONLY FROM THE NECK DOWN.  Do not use on open wounds or open sores.  Avoid contact with your eyes, ears, mouth and genitals (private parts).  Wash genitals (private parts) with your normal soap.  6.  Wash thoroughly, paying special attention to the area where your surgery will be performed.  7.  Thoroughly rinse your body with warm water from the neck down.  8.  DO NOT shower/wash with your normal soap after using and rinsing off the CHG Soap.  9.  Pat yourself dry with a clean towel.            10.  Wear clean pajamas.            11.  Place clean sheets on  your bed the night of your first shower and do not sleep with pets.  Day of Surgery  Do not apply any lotions the morning of surgery.  Please wear clean clothes to the hospital/surgery center.     Please read over the following fact sheets that you were given: Pain Booklet, Coughing and Deep Breathing, Blood Transfusion Information and Surgical Site Infection Prevention

## 2014-12-03 ENCOUNTER — Ambulatory Visit (HOSPITAL_COMMUNITY)
Admission: RE | Admit: 2014-12-03 | Discharge: 2014-12-03 | Disposition: A | Discharge status: Home / Self Care | Payer: BLUE CROSS/BLUE SHIELD | Source: Ambulatory Visit | Attending: Neurosurgery | Admitting: Neurosurgery

## 2014-12-03 ENCOUNTER — Encounter (HOSPITAL_COMMUNITY): Payer: Self-pay

## 2014-12-03 ENCOUNTER — Encounter (HOSPITAL_COMMUNITY)
Admission: RE | Admit: 2014-12-03 | Discharge: 2014-12-03 | Disposition: A | Discharge status: Home / Self Care | Payer: BLUE CROSS/BLUE SHIELD | Source: Ambulatory Visit | Attending: Neurosurgery | Admitting: Neurosurgery

## 2014-12-03 DIAGNOSIS — M898X8 Other specified disorders of bone, other site: Secondary | ICD-10-CM

## 2014-12-03 DIAGNOSIS — M899 Disorder of bone, unspecified: Secondary | ICD-10-CM | POA: Insufficient documentation

## 2014-12-03 LAB — CBC
HCT: 37.5 % (ref 36.0–46.0)
Hemoglobin: 12.6 g/dL (ref 12.0–15.0)
MCH: 29.1 pg (ref 26.0–34.0)
MCHC: 33.6 g/dL (ref 30.0–36.0)
MCV: 86.6 fL (ref 78.0–100.0)
Platelets: 181 10*3/uL (ref 150–400)
RBC: 4.33 MIL/uL (ref 3.87–5.11)
RDW: 14.8 % (ref 11.5–15.5)
WBC: 9.7 10*3/uL (ref 4.0–10.5)

## 2014-12-03 LAB — BASIC METABOLIC PANEL
ANION GAP: 5 (ref 5–15)
BUN: 18 mg/dL (ref 6–23)
CALCIUM: 8.8 mg/dL (ref 8.4–10.5)
CHLORIDE: 108 mmol/L (ref 96–112)
CO2: 27 mmol/L (ref 19–32)
Creatinine, Ser: 0.88 mg/dL (ref 0.50–1.10)
GFR calc Af Amer: 89 mL/min — ABNORMAL LOW (ref >90)
GFR calc non Af Amer: 76 mL/min — ABNORMAL LOW (ref >90)
Glucose, Bld: 102 mg/dL — ABNORMAL HIGH (ref 70–99)
Potassium: 4.1 mmol/L (ref 3.5–5.1)
Sodium: 140 mmol/L (ref 135–145)

## 2014-12-03 LAB — TYPE AND SCREEN
ABO/RH(D): O POS
ANTIBODY SCREEN: NEGATIVE

## 2014-12-03 NOTE — Progress Notes (Signed)
PCP is [employer]. Patient informed Nurse that she recently had a stress test at [facility]. Will request records. Patient stated she had a cardiac cath approximately ten years ago; no PCI. Patient also informed Nurse that she has some shortness of breath but thinks it is related to sarcoidosis. Patient denied having any new chest pain or shortness of breath.

## 2014-12-03 NOTE — Progress Notes (Signed)
Anesthesia Chart Review:  Pt is 49 year old female scheduled for L frontal stereotactic craniectomy for biopsy of skull lesion on 12/05/2014 with Dr. Kathyrn Sheriff.   PCP is Dr. Chesley Mires.   PMH includes: asthma, sarcoidosis, heart murmur ("when I was a baby"). Former smoker. BMI 27.5. S/p mediastinocopy 10/26/14, s/p video bronchoscopy 09/21/14, s/p pubo-vaginal sling 11/30/12.   Preoperative labs reviewed.    Chest x-ray 11/14/2014 reviewed. Stable mediastinal and bilateral hilar adenopathy consistent with the described history of sarcoidosis. No acute cardiopulmonary findings.  EKG 10/03/2014: Sinus rhythm. RSR' in V1 or V2, probably normal variant.   Nuclear stress test 11/16/2012: -the post stress LV is normal in size. Perfusion defect in the apical myocardial region is consistent with breast attenuation. The remaining myocardium demonstrates normal myocardial perfusion with no evidence of ischemia or infarct -no regional wall motion abnormalities -exercise capacity is 7.0 METS. Exercise capacity is normal. Normal max effort GXT.  -normal myocardial perfusion study. Normal myocardial perfusion scan demonstrating an attenuation artifact in the apical region of the myocardium. No ischemia or infarct/scar is seen in the remaining myocardium. This was essentially a low risk scan.   If no changes, I anticipate pt can proceed with surgery as scheduled.   Willeen Cass, FNP-BC [facility]/Anesthesiology Phone: [phone] 12/03/2014 3:32 PM

## 2014-12-04 ENCOUNTER — Encounter (HOSPITAL_COMMUNITY): Payer: BLUE CROSS/BLUE SHIELD

## 2014-12-04 ENCOUNTER — Ambulatory Visit: Payer: BLUE CROSS/BLUE SHIELD | Admitting: Adult Health

## 2014-12-05 ENCOUNTER — Encounter (HOSPITAL_COMMUNITY): Payer: Self-pay | Admitting: Certified Registered"

## 2014-12-05 ENCOUNTER — Inpatient Hospital Stay (HOSPITAL_COMMUNITY): Payer: BLUE CROSS/BLUE SHIELD | Admitting: Emergency Medicine

## 2014-12-05 ENCOUNTER — Inpatient Hospital Stay (HOSPITAL_COMMUNITY): Payer: BLUE CROSS/BLUE SHIELD | Admitting: Certified Registered"

## 2014-12-05 ENCOUNTER — Inpatient Hospital Stay (HOSPITAL_COMMUNITY): Payer: BLUE CROSS/BLUE SHIELD

## 2014-12-05 ENCOUNTER — Inpatient Hospital Stay (HOSPITAL_COMMUNITY)
Admission: RE | Admit: 2014-12-05 | Discharge: 2014-12-07 | DRG: 983 | Disposition: A | Discharge status: Home / Self Care | Payer: BLUE CROSS/BLUE SHIELD | Source: Ambulatory Visit | Attending: Neurosurgery | Admitting: Neurosurgery

## 2014-12-05 ENCOUNTER — Encounter (HOSPITAL_COMMUNITY)
Admission: RE | Disposition: A | Discharge status: Home / Self Care | Payer: Self-pay | Source: Ambulatory Visit | Attending: Neurosurgery

## 2014-12-05 DIAGNOSIS — G8929 Other chronic pain: Secondary | ICD-10-CM | POA: Diagnosis present

## 2014-12-05 DIAGNOSIS — F419 Anxiety disorder, unspecified: Secondary | ICD-10-CM | POA: Diagnosis present

## 2014-12-05 DIAGNOSIS — D8689 Sarcoidosis of other sites: Principal | ICD-10-CM | POA: Diagnosis present

## 2014-12-05 DIAGNOSIS — N3010 Interstitial cystitis (chronic) without hematuria: Secondary | ICD-10-CM | POA: Diagnosis present

## 2014-12-05 DIAGNOSIS — Z7951 Long term (current) use of inhaled steroids: Secondary | ICD-10-CM | POA: Diagnosis not present

## 2014-12-05 DIAGNOSIS — D869 Sarcoidosis, unspecified: Secondary | ICD-10-CM | POA: Diagnosis present

## 2014-12-05 DIAGNOSIS — N393 Stress incontinence (female) (male): Secondary | ICD-10-CM | POA: Diagnosis present

## 2014-12-05 DIAGNOSIS — Z79899 Other long term (current) drug therapy: Secondary | ICD-10-CM

## 2014-12-05 DIAGNOSIS — K219 Gastro-esophageal reflux disease without esophagitis: Secondary | ICD-10-CM | POA: Diagnosis present

## 2014-12-05 DIAGNOSIS — E039 Hypothyroidism, unspecified: Secondary | ICD-10-CM | POA: Diagnosis present

## 2014-12-05 DIAGNOSIS — M549 Dorsalgia, unspecified: Secondary | ICD-10-CM | POA: Diagnosis present

## 2014-12-05 DIAGNOSIS — G2581 Restless legs syndrome: Secondary | ICD-10-CM | POA: Diagnosis present

## 2014-12-05 DIAGNOSIS — F329 Major depressive disorder, single episode, unspecified: Secondary | ICD-10-CM | POA: Diagnosis present

## 2014-12-05 DIAGNOSIS — Z7952 Long term (current) use of systemic steroids: Secondary | ICD-10-CM | POA: Diagnosis not present

## 2014-12-05 DIAGNOSIS — Z87891 Personal history of nicotine dependence: Secondary | ICD-10-CM

## 2014-12-05 DIAGNOSIS — K589 Irritable bowel syndrome without diarrhea: Secondary | ICD-10-CM | POA: Diagnosis present

## 2014-12-05 DIAGNOSIS — Z981 Arthrodesis status: Secondary | ICD-10-CM | POA: Diagnosis not present

## 2014-12-05 DIAGNOSIS — M899 Disorder of bone, unspecified: Secondary | ICD-10-CM | POA: Diagnosis present

## 2014-12-05 HISTORY — PX: CRANIECTOMY FOR DEPRESSED SKULL FRACTURE: SHX5788

## 2014-12-05 LAB — CBC
HEMATOCRIT: 37.8 % (ref 36.0–46.0)
Hemoglobin: 12.5 g/dL (ref 12.0–15.0)
MCH: 28.7 pg (ref 26.0–34.0)
MCHC: 33.1 g/dL (ref 30.0–36.0)
MCV: 86.7 fL (ref 78.0–100.0)
Platelets: 184 10*3/uL (ref 150–400)
RBC: 4.36 MIL/uL (ref 3.87–5.11)
RDW: 14.9 % (ref 11.5–15.5)
WBC: 11.1 10*3/uL — AB (ref 4.0–10.5)

## 2014-12-05 LAB — CREATININE, SERUM
CREATININE: 0.85 mg/dL (ref 0.50–1.10)
GFR, EST NON AFRICAN AMERICAN: 80 mL/min — AB (ref >90)

## 2014-12-05 LAB — MRSA PCR SCREENING: MRSA by PCR: NEGATIVE

## 2014-12-05 SURGERY — CRANIECTOMY FOR DEPRESSED SKULL FRACTURE
Anesthesia: General

## 2014-12-05 MED ORDER — PREDNISONE 20 MG PO TABS
40.0000 mg | ORAL_TABLET | Freq: Every day | ORAL | Status: DC
  Administered 2014-12-06 – 2014-12-07 (×2): 40 mg via ORAL
  Filled 2014-12-05 (×3): qty 2

## 2014-12-05 MED ORDER — NEOSTIGMINE METHYLSULFATE 10 MG/10ML IV SOLN
INTRAVENOUS | Status: DC | PRN
  Administered 2014-12-05: 3 mg via INTRAVENOUS

## 2014-12-05 MED ORDER — ROCURONIUM BROMIDE 50 MG/5ML IV SOLN
INTRAVENOUS | Status: AC
  Filled 2014-12-05: qty 1

## 2014-12-05 MED ORDER — DICYCLOMINE HCL 20 MG PO TABS
20.0000 mg | ORAL_TABLET | Freq: Four times a day (QID) | ORAL | Status: DC
  Administered 2014-12-05 – 2014-12-07 (×7): 20 mg via ORAL
  Filled 2014-12-05 (×13): qty 1

## 2014-12-05 MED ORDER — EPHEDRINE SULFATE 50 MG/ML IJ SOLN
INTRAMUSCULAR | Status: DC | PRN
  Administered 2014-12-05: 10 mg via INTRAVENOUS
  Administered 2014-12-05: 5 mg via INTRAVENOUS

## 2014-12-05 MED ORDER — TRAMADOL HCL 50 MG PO TABS
50.0000 mg | ORAL_TABLET | Freq: Four times a day (QID) | ORAL | Status: DC | PRN
  Administered 2014-12-07: 50 mg via ORAL
  Filled 2014-12-05: qty 1

## 2014-12-05 MED ORDER — PAROXETINE HCL 20 MG PO TABS
20.0000 mg | ORAL_TABLET | Freq: Every day | ORAL | Status: DC
  Administered 2014-12-05 – 2014-12-06 (×2): 20 mg via ORAL
  Filled 2014-12-05 (×3): qty 1

## 2014-12-05 MED ORDER — MELOXICAM 7.5 MG PO TABS: 7.5000 mg | ORAL_TABLET | Freq: Two times a day (BID) | ORAL | Status: DC | PRN

## 2014-12-05 MED ORDER — FENTANYL CITRATE 0.05 MG/ML IJ SOLN
INTRAMUSCULAR | Status: DC | PRN
  Administered 2014-12-05 (×5): 50 ug via INTRAVENOUS

## 2014-12-05 MED ORDER — ONDANSETRON HCL 4 MG/2ML IJ SOLN
INTRAMUSCULAR | Status: DC | PRN
  Administered 2014-12-05: 4 mg via INTRAVENOUS

## 2014-12-05 MED ORDER — ALBUTEROL SULFATE (2.5 MG/3ML) 0.083% IN NEBU: 2.5000 mg | INHALATION_SOLUTION | Freq: Four times a day (QID) | RESPIRATORY_TRACT | Status: DC | PRN

## 2014-12-05 MED ORDER — TRAZODONE HCL 50 MG PO TABS
50.0000 mg | ORAL_TABLET | Freq: Every day | ORAL | Status: DC
  Administered 2014-12-05 – 2014-12-06 (×2): 50 mg via ORAL
  Filled 2014-12-05 (×3): qty 1

## 2014-12-05 MED ORDER — HYDROMORPHONE HCL 1 MG/ML IJ SOLN
INTRAMUSCULAR | Status: AC
  Filled 2014-12-05: qty 1

## 2014-12-05 MED ORDER — THROMBIN 20000 UNITS EX SOLR
CUTANEOUS | Status: DC | PRN
  Administered 2014-12-05: 16:00:00 via TOPICAL

## 2014-12-05 MED ORDER — NEOSTIGMINE METHYLSULFATE 10 MG/10ML IV SOLN
INTRAVENOUS | Status: AC
  Filled 2014-12-05: qty 1

## 2014-12-05 MED ORDER — 0.9 % SODIUM CHLORIDE (POUR BTL) OPTIME
TOPICAL | Status: DC | PRN
  Administered 2014-12-05 (×2): 1000 mL

## 2014-12-05 MED ORDER — PROPOFOL 10 MG/ML IV BOLUS
INTRAVENOUS | Status: AC
  Filled 2014-12-05: qty 20

## 2014-12-05 MED ORDER — PHENYLEPHRINE 40 MCG/ML (10ML) SYRINGE FOR IV PUSH (FOR BLOOD PRESSURE SUPPORT)
PREFILLED_SYRINGE | INTRAVENOUS | Status: AC
  Filled 2014-12-05: qty 20

## 2014-12-05 MED ORDER — HYDROMORPHONE HCL 2 MG PO TABS: 4.0000 mg | ORAL_TABLET | ORAL | Status: DC | PRN

## 2014-12-05 MED ORDER — SODIUM CHLORIDE 0.9 % IJ SOLN
INTRAMUSCULAR | Status: AC
  Filled 2014-12-05: qty 10

## 2014-12-05 MED ORDER — PROPOFOL 10 MG/ML IV BOLUS
INTRAVENOUS | Status: DC | PRN
  Administered 2014-12-05: 30 mg via INTRAVENOUS
  Administered 2014-12-05: 150 mg via INTRAVENOUS

## 2014-12-05 MED ORDER — LACTATED RINGERS IV SOLN
INTRAVENOUS | Status: DC
  Administered 2014-12-05 (×2): via INTRAVENOUS

## 2014-12-05 MED ORDER — CEFAZOLIN SODIUM-DEXTROSE 2-3 GM-% IV SOLR
INTRAVENOUS | Status: AC
  Filled 2014-12-05: qty 50

## 2014-12-05 MED ORDER — PROMETHAZINE HCL 25 MG PO TABS: 12.5000 mg | ORAL_TABLET | ORAL | Status: DC | PRN

## 2014-12-05 MED ORDER — CEFAZOLIN SODIUM 1-5 GM-% IV SOLN
1.0000 g | Freq: Three times a day (TID) | INTRAVENOUS | Status: AC
  Administered 2014-12-06 (×2): 1 g via INTRAVENOUS
  Filled 2014-12-05 (×3): qty 50

## 2014-12-05 MED ORDER — MORPHINE SULFATE 2 MG/ML IJ SOLN
1.0000 mg | INTRAMUSCULAR | Status: DC | PRN
  Administered 2014-12-05 – 2014-12-06 (×4): 2 mg via INTRAVENOUS
  Filled 2014-12-05 (×4): qty 1

## 2014-12-05 MED ORDER — MIDAZOLAM HCL 2 MG/2ML IJ SOLN
INTRAMUSCULAR | Status: AC
  Filled 2014-12-05: qty 2

## 2014-12-05 MED ORDER — GLYCOPYRROLATE 0.2 MG/ML IJ SOLN
INTRAMUSCULAR | Status: AC
  Filled 2014-12-05: qty 2

## 2014-12-05 MED ORDER — VALACYCLOVIR HCL 500 MG PO TABS
500.0000 mg | ORAL_TABLET | Freq: Every day | ORAL | Status: DC
  Administered 2014-12-05 – 2014-12-06 (×2): 500 mg via ORAL
  Filled 2014-12-05 (×3): qty 1

## 2014-12-05 MED ORDER — LIDOCAINE HCL (CARDIAC) 20 MG/ML IV SOLN
INTRAVENOUS | Status: AC
Start: 2014-12-05 — End: 2014-12-05
  Filled 2014-12-05: qty 5

## 2014-12-05 MED ORDER — ONDANSETRON HCL 4 MG/2ML IJ SOLN
INTRAMUSCULAR | Status: AC
  Filled 2014-12-05: qty 2

## 2014-12-05 MED ORDER — LIDOCAINE HCL (CARDIAC) 20 MG/ML IV SOLN
INTRAVENOUS | Status: AC
  Filled 2014-12-05: qty 5

## 2014-12-05 MED ORDER — PANTOPRAZOLE SODIUM 40 MG PO TBEC
80.0000 mg | DELAYED_RELEASE_TABLET | Freq: Every day | ORAL | Status: DC
  Administered 2014-12-05 – 2014-12-07 (×3): 80 mg via ORAL
  Filled 2014-12-05 (×3): qty 2

## 2014-12-05 MED ORDER — DIAZEPAM 2 MG PO TABS
2.0000 mg | ORAL_TABLET | Freq: Every day | ORAL | Status: DC
  Administered 2014-12-05 – 2014-12-06 (×2): 2 mg via ORAL
  Filled 2014-12-05 (×2): qty 1

## 2014-12-05 MED ORDER — SENNOSIDES-DOCUSATE SODIUM 8.6-50 MG PO TABS
1.0000 | ORAL_TABLET | Freq: Every evening | ORAL | Status: DC | PRN
  Filled 2014-12-05: qty 1

## 2014-12-05 MED ORDER — LIDOCAINE-EPINEPHRINE 1 %-1:100000 IJ SOLN
INTRAMUSCULAR | Status: DC | PRN
  Administered 2014-12-05: 5 mL

## 2014-12-05 MED ORDER — HYDROMORPHONE HCL 1 MG/ML IJ SOLN
0.2500 mg | INTRAMUSCULAR | Status: DC | PRN
  Administered 2014-12-05 (×4): 0.5 mg via INTRAVENOUS

## 2014-12-05 MED ORDER — EPHEDRINE SULFATE 50 MG/ML IJ SOLN
INTRAMUSCULAR | Status: AC
  Filled 2014-12-05: qty 1

## 2014-12-05 MED ORDER — ONDANSETRON HCL 4 MG PO TABS
4.0000 mg | ORAL_TABLET | ORAL | Status: DC | PRN
  Administered 2014-12-06: 4 mg via ORAL
  Filled 2014-12-05: qty 1

## 2014-12-05 MED ORDER — GLYCOPYRROLATE 0.2 MG/ML IJ SOLN
INTRAMUSCULAR | Status: DC | PRN
  Administered 2014-12-05: 0.4 mg via INTRAVENOUS

## 2014-12-05 MED ORDER — LIDOCAINE HCL (CARDIAC) 20 MG/ML IV SOLN
INTRAVENOUS | Status: DC | PRN
Start: 2014-12-05 — End: 2014-12-05
  Administered 2014-12-05: 80 mg via INTRAVENOUS

## 2014-12-05 MED ORDER — VECURONIUM BROMIDE 10 MG IV SOLR
INTRAVENOUS | Status: AC
  Filled 2014-12-05: qty 20

## 2014-12-05 MED ORDER — SODIUM CHLORIDE 0.9 % IV SOLN
INTRAVENOUS | Status: DC
  Administered 2014-12-05 – 2014-12-07 (×3): via INTRAVENOUS

## 2014-12-05 MED ORDER — ROCURONIUM BROMIDE 100 MG/10ML IV SOLN
INTRAVENOUS | Status: DC | PRN
  Administered 2014-12-05: 50 mg via INTRAVENOUS

## 2014-12-05 MED ORDER — SUFENTANIL CITRATE 50 MCG/ML IV SOLN
INTRAVENOUS | Status: AC
  Filled 2014-12-05: qty 1

## 2014-12-05 MED ORDER — LEVOTHYROXINE SODIUM 137 MCG PO TABS: 137.0000 ug | ORAL_TABLET | Freq: Every morning | ORAL | Status: DC

## 2014-12-05 MED ORDER — MIDAZOLAM HCL 5 MG/ML IJ SOLN: 2.0000 mg | Freq: Once | INTRAMUSCULAR | Status: DC

## 2014-12-05 MED ORDER — LORAZEPAM 1 MG PO TABS
1.0000 mg | ORAL_TABLET | Freq: Two times a day (BID) | ORAL | Status: DC | PRN
  Administered 2014-12-07: 1 mg via ORAL
  Filled 2014-12-05: qty 1

## 2014-12-05 MED ORDER — LABETALOL HCL 5 MG/ML IV SOLN: 10.0000 mg | INTRAVENOUS | Status: DC | PRN

## 2014-12-05 MED ORDER — SODIUM CHLORIDE 0.9 % IR SOLN
Status: DC | PRN
  Administered 2014-12-05: 16:00:00

## 2014-12-05 MED ORDER — ONDANSETRON HCL 4 MG/2ML IJ SOLN
4.0000 mg | INTRAMUSCULAR | Status: DC | PRN
  Administered 2014-12-06: 4 mg via INTRAVENOUS
  Filled 2014-12-05: qty 2

## 2014-12-05 MED ORDER — HYDROMORPHONE HCL 1 MG/ML IJ SOLN
1.0000 mg | INTRAMUSCULAR | Status: DC | PRN
  Administered 2014-12-05 – 2014-12-07 (×9): 1 mg via INTRAVENOUS
  Filled 2014-12-05 (×8): qty 1

## 2014-12-05 MED ORDER — PROMETHAZINE HCL 25 MG/ML IJ SOLN: 6.2500 mg | INTRAMUSCULAR | Status: DC | PRN

## 2014-12-05 MED ORDER — HYDROMORPHONE HCL 2 MG PO TABS
1.0000 mg | ORAL_TABLET | ORAL | Status: DC | PRN
  Administered 2014-12-05 – 2014-12-06 (×4): 2 mg via ORAL
  Filled 2014-12-05 (×5): qty 1

## 2014-12-05 MED ORDER — CEFAZOLIN SODIUM-DEXTROSE 2-3 GM-% IV SOLR
INTRAVENOUS | Status: DC | PRN
  Administered 2014-12-05: 2 g via INTRAVENOUS

## 2014-12-05 MED ORDER — STERILE WATER FOR INJECTION IJ SOLN
INTRAMUSCULAR | Status: AC
  Filled 2014-12-05: qty 30

## 2014-12-05 MED ORDER — MIDAZOLAM HCL 2 MG/2ML IJ SOLN
INTRAMUSCULAR | Status: AC
  Administered 2014-12-05: 2 mg
  Filled 2014-12-05: qty 2

## 2014-12-05 MED ORDER — FENTANYL CITRATE 0.05 MG/ML IJ SOLN
INTRAMUSCULAR | Status: AC
  Filled 2014-12-05: qty 5

## 2014-12-05 MED ORDER — BUPIVACAINE HCL (PF) 0.5 % IJ SOLN
INTRAMUSCULAR | Status: DC | PRN
  Administered 2014-12-05: 5 mL

## 2014-12-05 MED ORDER — HEPARIN SODIUM (PORCINE) 5000 UNIT/ML IJ SOLN
5000.0000 [IU] | Freq: Three times a day (TID) | INTRAMUSCULAR | Status: DC
  Administered 2014-12-06 – 2014-12-07 (×5): 5000 [IU] via SUBCUTANEOUS
  Filled 2014-12-05 (×7): qty 1

## 2014-12-05 MED ORDER — FENTANYL CITRATE 0.05 MG/ML IJ SOLN
50.0000 ug | Freq: Once | INTRAMUSCULAR | Status: AC
  Administered 2014-12-05: 50 ug via INTRAVENOUS

## 2014-12-05 MED ORDER — LEVOTHYROXINE SODIUM 25 MCG PO TABS
137.0000 ug | ORAL_TABLET | Freq: Every day | ORAL | Status: DC
  Administered 2014-12-06 – 2014-12-07 (×2): 137 ug via ORAL
  Filled 2014-12-05 (×4): qty 1

## 2014-12-05 MED ORDER — FENTANYL CITRATE 0.05 MG/ML IJ SOLN
INTRAMUSCULAR | Status: AC
  Administered 2014-12-05: 50 ug via INTRAVENOUS
  Filled 2014-12-05: qty 2

## 2014-12-05 MED ORDER — ROPINIROLE HCL 1 MG PO TABS
2.0000 mg | ORAL_TABLET | Freq: Every day | ORAL | Status: DC
Start: 2014-12-05 — End: 2014-12-07
  Administered 2014-12-05 – 2014-12-06 (×2): 2 mg via ORAL
  Filled 2014-12-05 (×3): qty 2

## 2014-12-05 SURGICAL SUPPLY — 75 items
APL SKNCLS STERI-STRIP NONHPOA (GAUZE/BANDAGES/DRESSINGS)
BANDAGE GAUZE 4  KLING STR (GAUZE/BANDAGES/DRESSINGS) ×3 IMPLANT
BENZOIN TINCTURE PRP APPL 2/3 (GAUZE/BANDAGES/DRESSINGS) IMPLANT
BLADE CLIPPER SURG (BLADE) ×3 IMPLANT
BLADE ULTRA TIP 2M (BLADE) ×3 IMPLANT
BNDG GAUZE ELAST 4 BULKY (GAUZE/BANDAGES/DRESSINGS) ×3 IMPLANT
BRUSH SCRUB EZ 1% IODOPHOR (MISCELLANEOUS) ×3 IMPLANT
BUR ACORN 6.0 PRECISION (BURR) ×3 IMPLANT
BUR ADDG 1.1 (BURR) IMPLANT
BUR MATCHSTICK NEURO 3.0 LAGG (BURR) IMPLANT
BUR ROUTER D-58 CRANI (BURR) IMPLANT
CANISTER SUCT 3000ML PPV (MISCELLANEOUS) ×3 IMPLANT
CLIP TI MEDIUM 6 (CLIP) IMPLANT
CONT SPEC 4OZ CLIKSEAL STRL BL (MISCELLANEOUS) ×6 IMPLANT
DRAIN SNY WOU 7FLT (WOUND CARE) IMPLANT
DRAPE NEUROLOGICAL W/INCISE (DRAPES) ×3 IMPLANT
DRAPE SURG 17X23 STRL (DRAPES) IMPLANT
DRAPE WARM FLUID 44X44 (DRAPE) ×3 IMPLANT
DRSG TELFA 3X8 NADH (GAUZE/BANDAGES/DRESSINGS) ×3 IMPLANT
DURAPREP 6ML APPLICATOR 50/CS (WOUND CARE) ×3 IMPLANT
ELECT CAUTERY BLADE 6.4 (BLADE) ×3 IMPLANT
ELECT REM PT RETURN 9FT ADLT (ELECTROSURGICAL) ×3
ELECTRODE REM PT RTRN 9FT ADLT (ELECTROSURGICAL) ×2 IMPLANT
EVACUATOR 1/8 PVC DRAIN (DRAIN) IMPLANT
EVACUATOR SILICONE 100CC (DRAIN) IMPLANT
GAUZE SPONGE 4X4 12PLY STRL (GAUZE/BANDAGES/DRESSINGS) ×3 IMPLANT
GAUZE SPONGE 4X4 16PLY XRAY LF (GAUZE/BANDAGES/DRESSINGS) IMPLANT
GLOVE BIOGEL PI IND STRL 7.5 (GLOVE) ×2 IMPLANT
GLOVE BIOGEL PI INDICATOR 7.5 (GLOVE) ×1
GLOVE ECLIPSE 7.0 STRL STRAW (GLOVE) ×6 IMPLANT
GLOVE EXAM NITRILE LRG STRL (GLOVE) IMPLANT
GLOVE EXAM NITRILE MD LF STRL (GLOVE) IMPLANT
GLOVE EXAM NITRILE XL STR (GLOVE) IMPLANT
GLOVE EXAM NITRILE XS STR PU (GLOVE) IMPLANT
GOWN STRL REUS W/ TWL LRG LVL3 (GOWN DISPOSABLE) ×4 IMPLANT
GOWN STRL REUS W/ TWL XL LVL3 (GOWN DISPOSABLE) IMPLANT
GOWN STRL REUS W/TWL 2XL LVL3 (GOWN DISPOSABLE) IMPLANT
GOWN STRL REUS W/TWL LRG LVL3 (GOWN DISPOSABLE) ×2
GOWN STRL REUS W/TWL XL LVL3 (GOWN DISPOSABLE)
GRAFT DURAGEN MATRIX 2WX2L ×3 IMPLANT
HEMOSTAT POWDER KIT SURGIFOAM (HEMOSTASIS) ×3 IMPLANT
HEMOSTAT SURGICEL 2X14 (HEMOSTASIS) IMPLANT
KIT BASIN OR (CUSTOM PROCEDURE TRAY) ×3 IMPLANT
KIT ROOM TURNOVER OR (KITS) ×3 IMPLANT
MARKER SPHERE PSV REFLC 13MM (MARKER) ×9 IMPLANT
MESH PRE-CONTOURED 40MM 0.6MM (Mesh General) ×3 IMPLANT
NEEDLE HYPO 25X1 1.5 SAFETY (NEEDLE) ×3 IMPLANT
NS IRRIG 1000ML POUR BTL (IV SOLUTION) ×3 IMPLANT
PACK CRANIOTOMY (CUSTOM PROCEDURE TRAY) ×3 IMPLANT
PATTIES SURGICAL .5 X.5 (GAUZE/BANDAGES/DRESSINGS) IMPLANT
PATTIES SURGICAL .5 X3 (DISPOSABLE) IMPLANT
PATTIES SURGICAL 1X1 (DISPOSABLE) IMPLANT
SCREW SELF DRILL HT 1.5/4MM (Screw) ×24 IMPLANT
SPONGE NEURO XRAY DETECT 1X3 (DISPOSABLE) IMPLANT
SPONGE SURGIFOAM ABS GEL 100 (HEMOSTASIS) ×3 IMPLANT
STAPLER VISISTAT 35W (STAPLE) ×3 IMPLANT
STOCKINETTE 6  STRL (DRAPES) ×1
STOCKINETTE 6 STRL (DRAPES) ×2 IMPLANT
SUT ETHILON 3 0 FSL (SUTURE) IMPLANT
SUT ETHILON 3 0 PS 1 (SUTURE) IMPLANT
SUT NURALON 4 0 TR CR/8 (SUTURE) ×9 IMPLANT
SUT PL GUT 3 0 FS 1 (SUTURE) IMPLANT
SUT STEEL 0 (SUTURE)
SUT STEEL 0 18XMFL TIE 17 (SUTURE) IMPLANT
SUT VIC AB 0 CT1 18XCR BRD8 (SUTURE) ×4 IMPLANT
SUT VIC AB 0 CT1 8-18 (SUTURE) ×2
SUT VIC AB 3-0 SH 8-18 (SUTURE) ×6 IMPLANT
SYR CONTROL 10ML LL (SYRINGE) ×6 IMPLANT
TAPE CLOTH 3X10 TAN LF (GAUZE/BANDAGES/DRESSINGS) ×3 IMPLANT
TOWEL OR 17X24 6PK STRL BLUE (TOWEL DISPOSABLE) ×3 IMPLANT
TOWEL OR 17X26 10 PK STRL BLUE (TOWEL DISPOSABLE) ×3 IMPLANT
TRAY FOLEY CATH 14FRSI W/METER (CATHETERS) ×3 IMPLANT
TUBE CONNECTING 12X1/4 (SUCTIONS) ×3 IMPLANT
UNDERPAD 30X30 INCONTINENT (UNDERPADS AND DIAPERS) ×3 IMPLANT
WATER STERILE IRR 1000ML POUR (IV SOLUTION) ×3 IMPLANT

## 2014-12-05 NOTE — Anesthesia Procedure Notes (Signed)
Procedure Name: Intubation Date/Time: 12/05/2014 3:40 PM Performed by: Julian Reil Pre-anesthesia Checklist: Patient identified, Emergency Drugs available, Suction available and Patient being monitored Patient Re-evaluated:Patient Re-evaluated prior to inductionOxygen Delivery Method: Circle system utilized Preoxygenation: Pre-oxygenation with 100% oxygen Intubation Type: IV induction Ventilation: Mask ventilation without difficulty Laryngoscope Size: Mac and 3 Grade View: Grade I Tube type: Oral Tube size: 7.5 mm Number of attempts: 1 Airway Equipment and Method: Stylet Placement Confirmation: ETT inserted through vocal cords under direct vision,  positive ETCO2 and breath sounds checked- equal and bilateral Secured at: 21 cm Tube secured with: Tape Dental Injury: Teeth and Oropharynx as per pre-operative assessment

## 2014-12-05 NOTE — Progress Notes (Signed)
Pt back from CT, Neuro OR aware.

## 2014-12-05 NOTE — Progress Notes (Signed)
eLink Physician-Brief Progress Note Patient Name: [name] DOB: 1966/06/21 MRN: [id number]   Date of Service  12/05/2014  HPI/Events of Note  S/p excisional biopsy of left frontal bone lesion Sarcoid on mediastinal LN biopsy, on prednisone  eICU Interventions  Labs ok Ct home meds incl prednisone   New ICU patient evaluation: This patient was evaluated by the [facility] team. I have reviewed relevant documentation including care plan & orders.    Intervention Category Evaluation Type: New Patient Evaluation  ALVA,RAKESH V. 12/05/2014, 6:47 PM

## 2014-12-05 NOTE — Anesthesia Preprocedure Evaluation (Signed)
Anesthesia Evaluation  Patient identified by MRN, date of birth, ID band Patient awake    Reviewed: Allergy & Precautions, NPO status , Patient's Chart, lab work & pertinent test results  Airway Mallampati: II  TM Distance: >3 FB Neck ROM: Full    Dental no notable dental hx.    Pulmonary shortness of breath and with exertion, asthma , former smoker,  sarcoidosis breath sounds clear to auscultation  Pulmonary exam normal       Cardiovascular negative cardio ROS  Rhythm:Regular Rate:Normal     Neuro/Psych negative neurological ROS  negative psych ROS   GI/Hepatic Neg liver ROS, GERD-  Medicated,  Endo/Other  negative endocrine ROSHypothyroidism   Renal/GU negative Renal ROS  negative genitourinary   Musculoskeletal negative musculoskeletal ROS (+)   Abdominal   Peds negative pediatric ROS (+)  Hematology negative hematology ROS (+)   Anesthesia Other Findings   Reproductive/Obstetrics negative OB ROS                             Anesthesia Physical Anesthesia Plan  ASA: III  Anesthesia Plan: General   Post-op Pain Management:    Induction: Intravenous  Airway Management Planned: Oral ETT  Additional Equipment:   Intra-op Plan:   Post-operative Plan: Extubation in OR  Informed Consent: I have reviewed the patients History and Physical, chart, labs and discussed the procedure including the risks, benefits and alternatives for the proposed anesthesia with the patient or authorized representative who has indicated his/her understanding and acceptance.   Dental advisory given  Plan Discussed with: CRNA and Surgeon  Anesthesia Plan Comments:         Anesthesia Quick Evaluation

## 2014-12-05 NOTE — Op Note (Signed)
PREOP DIAGNOSIS:  1. Left frontal bone lesion   POSTOP DIAGNOSIS: Same  PROCEDURE: 1. Stereotactic left frontal craniectomy for excisional biopsy of bone lesion 2. Left frontal cranioplasty, <5cm  SURGEON: Dr. Consuella Lose, MD  ASSISTANT: Dr. Kary Kos, MD  ANESTHESIA: General Endotracheal  EBL: 100cc  SPECIMENS: Left frontal bone lesion  DRAINS: None  COMPLICATIONS: None immediate  CONDITION: Hemodynamically stable to PACU  HISTORY: [name] is a 49 y.o. female who initially underwent CT and MRI in January at which time she's been worked up for stroke. This demonstrated an incidental left frontal bone lesion. She was subsequently worked up with CT of chest abdomen and pelvis which demonstrated hilar lymphadenopathy but no obvious source of primary malignancy. A biopsy of the hilar lymphadenopathy was consistent with sarcoidosis. She was placed on steroids, and follow-up head MRI demonstrates slight enlargement of the lesion. She therefore presents for excisional biopsy of the left frontal lesion.  PROCEDURE IN DETAIL: After informed consent was obtained and witnessed, the patient was brought to the operating room. After induction of general anesthesia, the patient was positioned on the operative table in the supine position. The Mayfield head holder was then affixed to the patient and secured to the table. All pressure points were meticulously padded. Utilizing the preoperative stereotactic CT scan, surface markers were co-registered with the scan until satisfactory accuracy was achieved. Skin incision was then marked out and prepped and draped in the usual sterile fashion.  After timeout was conducted, skin incision was infiltrated with local anesthetic with epinephrine. Skin incision was then made sharply, and Bovie electrocautery was used to dissect the subcutaneous tissue and the galea aponeurosis. Raney clips were then used for skin hemostasis. The temporal fascia  was identified, and a single piece skin flap was elevated including the temporal fat pad. Self-retaining retractors were then placed. Utilizing the stereotactic system, the lesion was then marked out. The high-speed drill was then used to create one burr hole just at the superior temporal line. The craniotome was then used to fashion a bone flap containing the bone lesion. This was elevated and removed in one piece, and sent for permanent pathology. Good hemostasis was confirmed on the epidural surface. A dural laceration was noted at the frontal side of the craniectomy. The craniectomy site was then covered with a piece of DuraGen. Cranioplasty was then fashioned with a piece of titanium mesh which was secured with standard screws.  At this point the wound is irrigated with copious amounts of normal saline irrigation. The galea was closed using interrupted 3-0 Vicryl sutures. The skin was closed using standard surgical skin staples. Sterile dressing was then applied after the Mayfield head holder was removed. The patient was then transferred to the stretcher and taken to the PACU in stable hemodynamic condition.  At the end of the case all sponge, needle, and instrument counts were correct.

## 2014-12-05 NOTE — Transfer of Care (Signed)
Immediate Anesthesia Transfer of Care Note  Patient: [name]  Procedure(s) Performed: Procedure(s) with comments: Left frontal stereotactic craniectomy for biopsy of skull lesion (Left) - Left frontal stereotactic craniectomy for biopsy of skull lesion APPLICATION OF CRANIAL NAVIGATION (N/A)  Patient Location: PACU  Anesthesia Type:General  Level of Consciousness: awake, alert , oriented and patient cooperative  Airway & Oxygen Therapy: Patient Spontanous Breathing and Patient connected to nasal cannula oxygen  Post-op Assessment: Report given to RN, Post -op Vital signs reviewed and stable, Patient moving all extremities and Patient moving all extremities X 4  Post vital signs: Reviewed and stable  Last Vitals:  Filed Vitals:   12/05/14 1405  BP: 147/92  Pulse: 82  Temp:   Resp: 12    Complications: No apparent anesthesia complications

## 2014-12-05 NOTE — H&P (Signed)
CC:  Bone lesion  HPI: [name] is a 49 y.o. female who initially presented in January with left-sided weakness. She was initially worked up for stroke which included CT scan and MRI of the brain. Although this did not demonstrate any stroke, a small left frontal bone lesion was identified. Initially, this was thought to represent a possible metastatic lesion, and therefore CT of chest abdomen and pelvis was done which did not demonstrate any obvious primary malignancy, but did demonstrate hilar lymphadenopathy. She subsequently underwent biopsy which was consistent with sarcoidosis. Follow-up head imaging demonstrated slight enlargement of the left frontal lesion despite steroid therapy. She was therefore referred and now presents for excisional biopsy.  PMH: Past Medical History  Diagnosis Date  . IC (interstitial cystitis)   . RLS (restless legs syndrome)   . GERD (gastroesophageal reflux disease)   . Depression   . Hypothyroidism   . Environmental allergies   . IBS (irritable bowel syndrome)   . Frequency of urination   . Nocturia   . SUI (stress urinary incontinence, female)   . Erosion of suburethral sling   . Herpes simplex type 2 infection   . NSVD (normal spontaneous vaginal delivery)     x3  . Asthma   . Chronic bronchitis     "get it close to q yr" (09/18/2014)  . Migraine     "maybe once/month" (09/18/2014)  . Headache     "at least several times/wk" (09/18/2014)  . Seizures     "when I was a teenager"  . Chronic back pain     "whole spine"  . Anxiety   . Shortness of breath dyspnea     Exertion  . Sarcoid   . Heart murmur     "when I was a baby"  . Arthritis     "back, hands, neck" (09/18/2014)    PSH: Past Surgical History  Procedure Laterality Date  . Cesarean section  2000  . Pelvic laparoscopy  1990's    LYSIS ADHESIONS  . Tonsillectomy  1986  . Appendectomy  1985  . Anterior cervical decomp/discectomy fusion  2011  . Abdominal hysterectomy   2006  . Knee arthroscopy Right   . Lumbar fusion  12-04-2008    L4  -- S1  . Lumbar disc surgery  02-02-2007    RIGHT SIDE L5 -- S1  . Lynx retropubic suburethral sling  03-02-2007  . Tubal ligation  2001    hulka clip  . Pubovaginal sling N/A 11/30/2012    Procedure: Gaynelle Arabian;  Surgeon: Bernestine Amass, MD;  Location: [facility];  Service: Urology;  Laterality: N/A;  1 HR EXAM UNDER ANESTHESIA, EXCISION OF SUB URETHRAL MESH, CYSTO, HOD   . Cysto with hydrodistension N/A 11/30/2012    Procedure: CYSTOSCOPY/HYDRODISTENSION;  Surgeon: Bernestine Amass, MD;  Location: [facility];  Service: Urology;  Laterality: N/A;  . Video bronchoscopy with endobronchial ultrasound N/A 09/21/2014    Procedure: VIDEO BRONCHOSCOPY WITH ENDOBRONCHIAL ULTRASOUND;  Surgeon: Collene Gobble, MD;  Location: [place];  Service: Thoracic;  Laterality: N/A;  . Mediastinoscopy N/A 10/26/2014    Procedure: MEDIASTINOSCOPY;  Surgeon: Melrose Nakayama, MD;  Location[facility];  Service: Thoracic;  Laterality: N/A;  . Back surgery  2008    X 2 in 2010    SH: History  Substance Use Topics  . Smoking status: Former Smoker -- 0.50 packs/day for 15 years    Types: Cigarettes  Quit date: 01/15/2014  . Smokeless tobacco: Never Used  . Alcohol Use: 0.0 oz/week    0 Standard drinks or equivalent per week     Comment: ocassional    MEDS: Prior to Admission medications   Medication Sig Start Date End Date Taking? Authorizing Provider  diazepam (VALIUM) 2 MG tablet Take 2 mg by mouth at bedtime.   Yes Historical Provider, MD  dicyclomine (BENTYL) 20 MG tablet Take 20 mg by mouth every 6 (six) hours.   Yes Historical Provider, MD  HYDROmorphone (DILAUDID) 4 MG tablet Take 1 tablet (4 mg total) by mouth every 4 (four) hours as needed for severe pain. 11/16/14  Yes Theodis Blaze, MD  levothyroxine (SYNTHROID, LEVOTHROID) 137 MCG tablet Take 137 mcg by mouth every morning.    Yes Historical  Provider, MD  LORazepam (ATIVAN) 1 MG tablet Take 1 tablet (1 mg total) by mouth 3 times/day as needed-between meals & bedtime for anxiety. 11/16/14  Yes Theodis Blaze, MD  meloxicam (MOBIC) 7.5 MG tablet Take 7.5 mg by mouth 2 (two) times daily as needed for pain.  06/27/14  Yes Historical Provider, MD  naproxen sodium (ANAPROX) 220 MG tablet Take 440 mg by mouth 2 (two) times daily as needed (for pain).    Yes Historical Provider, MD  omeprazole (PRILOSEC) 40 MG capsule Take 40 mg by mouth at bedtime.   Yes Historical Provider, MD  PARoxetine (PAXIL) 20 MG tablet Take 20 mg by mouth at bedtime.   Yes Historical Provider, MD  predniSONE (DELTASONE) 20 MG tablet Take 2 tablets (40 mg total) by mouth daily with breakfast. 11/12/14  Yes Chesley Mires, MD  rOPINIRole (REQUIP) 2 MG tablet Take 2 mg by mouth at bedtime.   Yes Historical Provider, MD  traMADol (ULTRAM) 50 MG tablet Take 1 tablet (50 mg total) by mouth every 6 (six) hours as needed. Patient taking differently: Take 50 mg by mouth every 6 (six) hours as needed for moderate pain.  11/16/14  Yes Theodis Blaze, MD  traZODone (DESYREL) 50 MG tablet Take 50 mg by mouth at bedtime.   Yes Historical Provider, MD  valACYclovir (VALTREX) 500 MG tablet Take 500 mg by mouth at bedtime.   Yes Historical Provider, MD  albuterol (PROVENTIL HFA;VENTOLIN HFA) 108 (90 BASE) MCG/ACT inhaler Inhale 2 puffs into the lungs every 6 (six) hours as needed. For shortness of breath 04/25/13   Elayne Snare, MD    ALLERGY: Allergies  Allergen Reactions  . Bactrim [Sulfamethoxazole-Trimethoprim] Other (See Comments)    Fever   . Percocet [Oxycodone-Acetaminophen] Itching  . Vicodin [Hydrocodone-Acetaminophen] Itching    ROS: ROS  NEUROLOGIC EXAM: Awake, alert, oriented Memory and concentration grossly intact Speech fluent, appropriate CN grossly intact Motor exam: Upper Extremities Deltoid Bicep Tricep Grip  Right 5/5 5/5 5/5 5/5  Left 5/5 5/5 5/5 5/5    Lower Extremity IP Quad PF DF EHL  Right 5/5 5/5 5/5 5/5 5/5  Left 5/5 5/5 5/5 5/5 5/5   Sensation grossly intact to LT  [facility]: MRI of the brain dated 11/14/2014 was reviewed. This demonstrates an approximately 1.3 cm Left frontal bone lesion without obvious intracranial extension. The lesion is T1 isointense, T2 hyperintense, and is contrast enhancing.  IMPRESSION: - 49 y.o. female with enlargement of a small left frontal bone lesion despite steroid therapy, and a presumptive diagnosis of sarcoidosis.  PLAN: - Proceed with stereotactic left frontal craniectomy for excisional biopsy  I have reviewed the MRI  and CT findings with the patient in the office. Given the slight enlargement of the lesion despite steroid therapy, and the unknown nature of the lesion, excisional biopsy is reasonable. The risks of the procedure were explained in detail to the patient. After all her questions were answered, consent was obtained.

## 2014-12-05 NOTE — Progress Notes (Signed)
Pt reporting headache and anxiety. Dr. Kalman Shan called and informed with orders to give 2mg  IV Versed and 50 mcgs IV Fentanyl which was done. Pt reports feeling better with less pain at 1405. Awaiting CT to be performed, this nurse called and was told transport on the way.

## 2014-12-05 NOTE — Progress Notes (Signed)
Pt transported to CT, VSS, remains alert and oriented, has been visiting with family.

## 2014-12-05 NOTE — Progress Notes (Signed)
Radiology called again regarding STAT CT, this nurse told will come get pt in about 10 minutes.

## 2014-12-05 NOTE — Progress Notes (Signed)
Pt seen in PACU. Groggy but arousable. Following commands. Moving all extremities well.

## 2014-12-06 ENCOUNTER — Inpatient Hospital Stay (HOSPITAL_COMMUNITY): Payer: BLUE CROSS/BLUE SHIELD

## 2014-12-06 ENCOUNTER — Encounter (HOSPITAL_COMMUNITY): Payer: Self-pay | Admitting: Neurosurgery

## 2014-12-06 NOTE — Progress Notes (Signed)
Subjective: Patient reports Other than headache she has Done well no numbness or tingling face arms legs  Objective: Vital signs in last 24 hours: Temp:  [98.1 F (36.7 C)-99 F (37.2 C)] 99 F (37.2 C) (04/07 0348) Pulse Rate:  [79-112] 88 (04/07 0700) Resp:  [8-22] 17 (04/07 0700) BP: (77-164)/(50-112) 122/73 mmHg (04/07 0700) SpO2:  [89 %-99 %] 95 % (04/07 0700) Weight:  [78.642 kg (173 lb 6 oz)-80.4 kg (177 lb 4 oz)] 80.4 kg (177 lb 4 oz) (04/06 1800)  Intake/Output from previous day: 04/06 0701 - 04/07 0700 In: 1900 [I.V.:1850; IV Piggyback:50] Out: 2350 [Urine:2300; Blood:50] Intake/Output this shift:    Awake alert oriented strength 5 over 5 bandage dry except for a small spot of saturation  Lab Results:  Recent Labs  12/03/14 0836 12/05/14 2040  WBC 9.7 11.1*  HGB 12.6 12.5  HCT 37.5 37.8  PLT 181 184   BMET  Recent Labs  12/03/14 0836 12/05/14 2040  NA 140  --   K 4.1  --   CL 108  --   CO2 27  --   GLUCOSE 102*  --   BUN 18  --   CREATININE 0.88 0.85  CALCIUM 8.8  --     Studies/Results: Ct Head Wo Contrast  12/06/2014   CLINICAL DATA:  Frontal skull fracture, headache. Status post stereotactic LEFT frontal craniectomy for excision biopsy of bone lesion (sarcoidosis).  EXAM: CT HEAD WITHOUT CONTRAST  TECHNIQUE: Contiguous axial images were obtained from the base of the skull through the vertex without intravenous contrast.  COMPARISON:  CT of the head December 05, 2014 and MRI of the brain November 14, 2014  FINDINGS: The ventricles and sulci are normal. No intraparenchymal hemorrhage, mass effect nor midline shift. No acute large vascular territory infarcts.  No abnormal extra-axial fluid collections. Basal cisterns are patent.  Interval LEFT frontal craniectomy, for resection of known calvarial lesion. Overlying plate and screw fixation, with scalp soft tissue swelling, subcutaneous gas and skin staples. The included ocular globes and orbital contents are  non-suspicious. The mastoid aircells and included paranasal sinuses are well-aerated.  IMPRESSION: No acute intracranial process; normal noncontrast CT of the head.  Interval LEFT frontal cranioplasty with recent postoperative changes.   Electronically Signed   By: Elon Alas   On: 12/06/2014 04:36   Ct Head Wo Contrast  12/05/2014   CLINICAL DATA:  49 year old female study for stereotactic surgical planning, lytic frontal bone lesion. Subsequent encounter.  EXAM: CT HEAD WITHOUT CONTRAST  TECHNIQUE: Contiguous axial images were obtained from the base of the skull through the vertex without intravenous contrast.  COMPARISON:  Head CT 12/03/2014.  Brain MRI 11/14/2014 and earlier.  FINDINGS: Abnormal focus of left frontal bone lucency with somewhat ground-glass type appearance re- identified. This has indistinct margins with the adjacent calvarium medullary space. Today this encompasses 14 x 7 mm, versus 11 x 6 mm on 08/1914. It appears stable in size and configuration more recently.  Bone mineralization elsewhere appears stable and normal.  Visualized paranasal sinuses and mastoids are clear. Visualized orbit soft tissues are within normal limits.  No abnormal scalp soft tissues overlying the left frontal bone lesion.  Stable and Normal noncontrast CT appearance of the brain. No evidence of dural thickening or other abnormality underlying the left frontal bone lesion.  IMPRESSION: Study for stereotactic surgical planning. Left frontal bone lesion has mildly enlarged since first identified January, stable more recently.   Electronically Signed  By: Genevie Ann M.D.   On: 12/05/2014 15:14    Assessment/Plan: Mobilized and transferred to floor observe another day.  LOS: 1 day     , P 12/06/2014, 7:34 AM

## 2014-12-07 MED ORDER — HYDROMORPHONE HCL 2 MG PO TABS: 1.0000 mg | ORAL_TABLET | ORAL | Status: DC | PRN

## 2014-12-07 NOTE — Progress Notes (Signed)
Patient ID: [name], female   DOB: Jan 28, 1966, 49 y.o.   MRN: [id number]   Doing well pain well controlled  Neurologically stable  Discharge home

## 2014-12-07 NOTE — Progress Notes (Signed)
Discharge education completed by RN. Pt  received a copy of discharge paperwork and confirms understanding of follow up appointments and discharge medications. Patient denies any questions at this time. IV removed, site is within normal limits. Pt will discharge from the unit via wheelchair. 

## 2014-12-07 NOTE — Progress Notes (Signed)
UR complete.    RN, MSN 

## 2014-12-07 NOTE — Anesthesia Postprocedure Evaluation (Signed)
  Anesthesia Post-op Note  Patient: [name]  Procedure(s) Performed: Procedure(s) (LRB): Left frontal stereotactic craniectomy for biopsy of skull lesion (Left) APPLICATION OF CRANIAL NAVIGATION (N/A)  Patient Location: PACU  Anesthesia Type: General  Level of Consciousness: awake and alert   Airway and Oxygen Therapy: Patient Spontanous Breathing  Post-op Pain: mild  Post-op Assessment: Post-op Vital signs reviewed, Patient's Cardiovascular Status Stable, Respiratory Function Stable, Patent Airway and No signs of Nausea or vomiting  Last Vitals:  Filed Vitals:   12/07/14 1324  BP: 116/67  Pulse: 74  Temp: 37.1 C  Resp: 18    Post-op Vital Signs: stable   Complications: No apparent anesthesia complications

## 2014-12-07 NOTE — Discharge Summary (Signed)
Physician Discharge Summary  Patient ID: [name] MRN: [id number] DOB/AGE: [birth year] 49 y.o.  Admit date: 12/05/2014 Discharge date: 12/07/2014  Admission Diagnoses: Skull lesion  Discharge Diagnoses: Same Active Problems:   Frontal skull lesion   Discharged Condition: good  Hospital Course: Patient was admitted underwent stereotactic biopsy of skull lesion procedure with her well patient the ICU postop day 1 postoperative to transfer to the floor and was stable to be discharged home.  Consults: Significant Diagnostic Studies: Treatments: Stereotactic biopsy of bone lesion Discharge Exam: Blood pressure 116/67, pulse 74, temperature 98.8 F (37.1 C), temperature source Oral, resp. rate 18, height 5\' 6"  (1.676 m), weight 80.4 kg (177 lb 4 oz), last menstrual period 06/03/2012, SpO2 94 %. Neurologically stable 55 strength awake alert  Disposition: Home     Medication List    TAKE these medications        albuterol 108 (90 BASE) MCG/ACT inhaler  Commonly known as:  PROVENTIL HFA;VENTOLIN HFA  Inhale 2 puffs into the lungs every 6 (six) hours as needed. For shortness of breath     diazepam 2 MG tablet  Commonly known as:  VALIUM  Take 2 mg by mouth at bedtime.     dicyclomine 20 MG tablet  Commonly known as:  BENTYL  Take 20 mg by mouth every 6 (six) hours.     HYDROmorphone 4 MG tablet  Commonly known as:  DILAUDID  Take 1 tablet (4 mg total) by mouth every 4 (four) hours as needed for severe pain.     HYDROmorphone 2 MG tablet  Commonly known as:  DILAUDID  Take 0.5-1 tablets (1-2 mg total) by mouth every 3 (three) hours as needed for severe pain.     levothyroxine 137 MCG tablet  Commonly known as:  SYNTHROID, LEVOTHROID  Take 137 mcg by mouth every morning.     LORazepam 1 MG tablet  Commonly known as:  ATIVAN  Take 1 tablet (1 mg total) by mouth 3 times/day as needed-between meals & bedtime for anxiety.     meloxicam 7.5 MG tablet  Commonly known  as:  MOBIC  Take 7.5 mg by mouth 2 (two) times daily as needed for pain.     naproxen sodium 220 MG tablet  Commonly known as:  ANAPROX  Take 440 mg by mouth 2 (two) times daily as needed (for pain).     omeprazole 40 MG capsule  Commonly known as:  PRILOSEC  Take 40 mg by mouth at bedtime.     PARoxetine 20 MG tablet  Commonly known as:  PAXIL  Take 20 mg by mouth at bedtime.     predniSONE 20 MG tablet  Commonly known as:  DELTASONE  Take 2 tablets (40 mg total) by mouth daily with breakfast.     rOPINIRole 2 MG tablet  Commonly known as:  REQUIP  Take 2 mg by mouth at bedtime.     traMADol 50 MG tablet  Commonly known as:  ULTRAM  Take 1 tablet (50 mg total) by mouth every 6 (six) hours as needed.     traZODone 50 MG tablet  Commonly known as:  DESYREL  Take 50 mg by mouth at bedtime.     valACYclovir 500 MG tablet  Commonly known as:  VALTREX  Take 500 mg by mouth at bedtime.           Follow-up Information    Follow up with Jairo Ben, MD.   Specialty:  Neurosurgery  Contact information:   [address] [place] [address] [place] [address] [phone]       Signed: Elaina Hoops 12/07/2014, 2:40 PM

## 2014-12-09 LAB — AFB CULTURE WITH SMEAR (NOT AT ARMC): Acid Fast Smear: NONE SEEN

## 2015-01-01 ENCOUNTER — Ambulatory Visit (INDEPENDENT_AMBULATORY_CARE_PROVIDER_SITE_OTHER)
Admission: RE | Admit: 2015-01-01 | Discharge: 2015-01-01 | Disposition: A | Discharge status: Home / Self Care | Payer: BLUE CROSS/BLUE SHIELD | Source: Ambulatory Visit | Attending: Adult Health | Admitting: Adult Health

## 2015-01-01 ENCOUNTER — Other Ambulatory Visit: Payer: BLUE CROSS/BLUE SHIELD

## 2015-01-01 ENCOUNTER — Ambulatory Visit (INDEPENDENT_AMBULATORY_CARE_PROVIDER_SITE_OTHER): Payer: BLUE CROSS/BLUE SHIELD | Admitting: Adult Health

## 2015-01-01 ENCOUNTER — Encounter: Payer: Self-pay | Admitting: Adult Health

## 2015-01-01 VITALS — BP 112/70 | HR 94 | Temp 98.3°F | Ht 66.0 in | Wt 179.2 lb

## 2015-01-01 DIAGNOSIS — D869 Sarcoidosis, unspecified: Secondary | ICD-10-CM | POA: Diagnosis not present

## 2015-01-01 DIAGNOSIS — M899 Disorder of bone, unspecified: Secondary | ICD-10-CM

## 2015-01-01 MED ORDER — PREDNISONE 10 MG PO TABS: ORAL_TABLET | ORAL | Status: DC

## 2015-01-01 NOTE — Patient Instructions (Addendum)
Decrease prednisone 15mg  daily for 1 week then 10mg  daily-hold at this dose.  follow up Dr. Halford Chessman  In 4 weeks with PFT  Labs and chest xray today    Late add : remain on prednisone 20mg  daily -patient aware , and refer to neurology

## 2015-01-02 LAB — ANGIOTENSIN CONVERTING ENZYME: ANGIOTENSIN-CONVERTING ENZYME: 26 U/L (ref 8–52)

## 2015-01-07 ENCOUNTER — Other Ambulatory Visit (INDEPENDENT_AMBULATORY_CARE_PROVIDER_SITE_OTHER): Payer: BLUE CROSS/BLUE SHIELD

## 2015-01-07 ENCOUNTER — Telehealth: Payer: Self-pay | Admitting: *Deleted

## 2015-01-07 ENCOUNTER — Other Ambulatory Visit: Payer: Self-pay | Admitting: Endocrinology

## 2015-01-07 DIAGNOSIS — E785 Hyperlipidemia, unspecified: Secondary | ICD-10-CM

## 2015-01-07 DIAGNOSIS — R7989 Other specified abnormal findings of blood chemistry: Secondary | ICD-10-CM

## 2015-01-07 DIAGNOSIS — E038 Other specified hypothyroidism: Secondary | ICD-10-CM

## 2015-01-07 DIAGNOSIS — R519 Headache, unspecified: Secondary | ICD-10-CM

## 2015-01-07 DIAGNOSIS — R51 Headache: Principal | ICD-10-CM

## 2015-01-07 LAB — COMPREHENSIVE METABOLIC PANEL
ALBUMIN: 4 g/dL (ref 3.5–5.2)
ALT: 20 U/L (ref 0–35)
AST: 16 U/L (ref 0–37)
Alkaline Phosphatase: 75 U/L (ref 39–117)
BILIRUBIN TOTAL: 0.2 mg/dL (ref 0.2–1.2)
BUN: 23 mg/dL (ref 6–23)
CO2: 28 meq/L (ref 19–32)
Calcium: 9.2 mg/dL (ref 8.4–10.5)
Chloride: 105 mEq/L (ref 96–112)
Creatinine, Ser: 0.99 mg/dL (ref 0.40–1.20)
GFR: 63.58 mL/min (ref >60.00)
Glucose, Bld: 92 mg/dL (ref 70–99)
Potassium: 4 mEq/L (ref 3.5–5.1)
SODIUM: 138 meq/L (ref 135–145)
TOTAL PROTEIN: 6.5 g/dL (ref 6.0–8.3)

## 2015-01-07 LAB — LIPID PANEL
CHOL/HDL RATIO: 4
Cholesterol: 215 mg/dL — ABNORMAL HIGH (ref 0–200)
HDL: 55 mg/dL (ref >39.00)
NONHDL: 160
Triglycerides: 279 mg/dL — ABNORMAL HIGH (ref 0.0–149.0)
VLDL: 55.8 mg/dL — AB (ref 0.0–40.0)

## 2015-01-07 LAB — TSH: TSH: 1.08 u[IU]/mL (ref 0.35–4.50)

## 2015-01-07 LAB — LDL CHOLESTEROL, DIRECT: Direct LDL: 100 mg/dL

## 2015-01-07 MED ORDER — PREDNISONE 10 MG PO TABS: ORAL_TABLET | ORAL | Status: DC

## 2015-01-07 NOTE — Telephone Encounter (Signed)
Neurology appt placed per TP.

## 2015-01-07 NOTE — Progress Notes (Signed)
Subjective:    Patient ID: [name], female    DOB: 01/13/1966, 49 y.o.   MRN: [id number]  HPI 49 yo female with sarcoidosis.   TESTS: 09/18/14 CT head >> 12 x 6 mm Lt frontal lytic lesion 09/18/14 MRI brain >> 12 mm Lt frontal lytic lesion 09/18/14 Bone scan >> no other lesions 09/19/14 CT chest >> Rt paratracheal LAN 1.5 cm, Rt subcarinal LAN 2 cm, Rt hilar LAN 1.4 cm, Lt hilar LAN 1.7 cm 09/19/14 CT abd/pelvis >> 1.6 cm LAN at gastrohepatic ligament, 1.3 cm LAN at portal vein, 0.9 cm LAN Rt renal artery 10/26/14 Mediastinoscopy >> granulomas 11/12/14 Start prednisone 12/05/14 Skull lesion bx >non necrotizing granulomas    01/01/15 Follow up : Sarcoid  Pt returns for a 1 month for sarcoid .  She underwent mediastinoscopy for mediastinal /hilar adenopathy after FOB was non diagnostic .  This was positive for granulomas consistent with sarcoid with elevated ACE level at 76.  She was started on prednisone 40mg  but was unable to tolerate dose due to high b/p, headache and  Jitteriness.  She underwent bx of skull lesion on 4/6 that showed non necrotizing granulomas.  She also had joint pain , seen at [facility]  . No records at this time Pt says was told to let pulmonary manage sarcoid  She says she is doing okay , remains on prednisone 20mg  daily  Worried about being on chronic steroids .  No cough . Does get winded at times.  Has headaches and eye pressure  No chest pain, orthopnea, rash, orthopnea , edema or fever.  ACE level is lower 76>26.      Review of Systems Constitutional:   No  weight loss, night sweats,  Fevers, chills, + fatigue, or  lassitude.  HEENT:      Difficulty swallowing,  Tooth/dental problems, or  Sore throat,                No sneezing, itching, ear ache, nasal congestion, post nasal drip,   CV:  No chest pain,  Orthopnea, PND, swelling in lower extremities, anasarca, dizziness, palpitations, syncope.   GI  No heartburn, indigestion, abdominal pain,  nausea, vomiting, diarrhea, change in bowel habits, loss of appetite, bloody stools.   Resp: .  No excess mucus, no productive cough,  No non-productive cough,  No coughing up of blood.  No change in color of mucus.  No wheezing.  No chest wall deformity  Skin: no rash or lesions.  GU: no dysuria, change in color of urine, no urgency or frequency.  No flank pain, no hematuria   MS:  +joint pain . No swelling.   Psych:  No change in mood or affect. No depression or anxiety.  No memory loss.         Objective:   Physical Exam GEN: A/Ox3; pleasant , NAD, well nourished   HEENT:  [place]/AT,  EACs-clear, TMs-wnl, NOSE-clear, THROAT-clear, no lesions, no postnasal drip or exudate noted.   NECK:  Supple w/ fair ROM; no JVD; normal carotid impulses w/o bruits; no thyromegaly or nodules palpated; no lymphadenopathy.  RESP  Clear  P & A; w/o, wheezes/ rales/ or rhonchi.no accessory muscle use, no dullness to percussion  CARD:  RRR, no m/r/g  , no peripheral edema, pulses intact, no cyanosis or clubbing.  GI:   Soft & nt; nml bowel sounds; no organomegaly or masses detected.  Musco: Warm bil, no deformities or joint swelling noted.  Neuro: alert, no focal deficits noted.    Skin: Warm, no lesions or rashes   CXR 01/01/15  No active cardiopulmonary disease or interval change. Mediastinal and hilar adenopathy compatible with history of sarcoidosis.     Assessment & Plan:

## 2015-01-07 NOTE — Assessment & Plan Note (Signed)
bx positive for sarcoid  Refer to neuro at [facility]

## 2015-01-07 NOTE — Telephone Encounter (Signed)
Care everywhere 

## 2015-01-07 NOTE — Assessment & Plan Note (Addendum)
Clinically stable from pulmonary aspect  -ACE is  Decreased 76>26  Will need PFT   CXR shows stable medistinal adenopathy  Case discussed with Dr. Halford Chessman  , with skull lesion positive for sarcoid and active headache  Will need to remain on prednisone 20mg  daily (unable to tolerate higher doses)  And refer to Neuro for evaluation .  Pt prefers [facility].

## 2015-01-08 ENCOUNTER — Emergency Department (HOSPITAL_COMMUNITY): Payer: BLUE CROSS/BLUE SHIELD

## 2015-01-08 ENCOUNTER — Encounter (HOSPITAL_COMMUNITY): Payer: Self-pay | Admitting: *Deleted

## 2015-01-08 ENCOUNTER — Telehealth: Payer: Self-pay | Admitting: *Deleted

## 2015-01-08 ENCOUNTER — Emergency Department (HOSPITAL_COMMUNITY)
Admission: EM | Admit: 2015-01-08 | Discharge: 2015-01-08 | Disposition: A | Discharge status: Home / Self Care | Payer: BLUE CROSS/BLUE SHIELD | Attending: Emergency Medicine | Admitting: Emergency Medicine

## 2015-01-08 DIAGNOSIS — E039 Hypothyroidism, unspecified: Secondary | ICD-10-CM | POA: Diagnosis not present

## 2015-01-08 DIAGNOSIS — Z87891 Personal history of nicotine dependence: Secondary | ICD-10-CM | POA: Insufficient documentation

## 2015-01-08 DIAGNOSIS — Z8619 Personal history of other infectious and parasitic diseases: Secondary | ICD-10-CM | POA: Diagnosis not present

## 2015-01-08 DIAGNOSIS — F419 Anxiety disorder, unspecified: Secondary | ICD-10-CM | POA: Diagnosis not present

## 2015-01-08 DIAGNOSIS — J45909 Unspecified asthma, uncomplicated: Secondary | ICD-10-CM | POA: Insufficient documentation

## 2015-01-08 DIAGNOSIS — Z862 Personal history of diseases of the blood and blood-forming organs and certain disorders involving the immune mechanism: Secondary | ICD-10-CM | POA: Insufficient documentation

## 2015-01-08 DIAGNOSIS — G8929 Other chronic pain: Secondary | ICD-10-CM | POA: Diagnosis not present

## 2015-01-08 DIAGNOSIS — I639 Cerebral infarction, unspecified: Secondary | ICD-10-CM

## 2015-01-08 DIAGNOSIS — R202 Paresthesia of skin: Secondary | ICD-10-CM | POA: Diagnosis not present

## 2015-01-08 DIAGNOSIS — R011 Cardiac murmur, unspecified: Secondary | ICD-10-CM | POA: Insufficient documentation

## 2015-01-08 DIAGNOSIS — F329 Major depressive disorder, single episode, unspecified: Secondary | ICD-10-CM | POA: Insufficient documentation

## 2015-01-08 DIAGNOSIS — K219 Gastro-esophageal reflux disease without esophagitis: Secondary | ICD-10-CM | POA: Insufficient documentation

## 2015-01-08 DIAGNOSIS — Z7952 Long term (current) use of systemic steroids: Secondary | ICD-10-CM | POA: Insufficient documentation

## 2015-01-08 DIAGNOSIS — G43809 Other migraine, not intractable, without status migrainosus: Secondary | ICD-10-CM

## 2015-01-08 DIAGNOSIS — Z79899 Other long term (current) drug therapy: Secondary | ICD-10-CM | POA: Insufficient documentation

## 2015-01-08 DIAGNOSIS — R51 Headache: Secondary | ICD-10-CM

## 2015-01-08 DIAGNOSIS — Z87828 Personal history of other (healed) physical injury and trauma: Secondary | ICD-10-CM | POA: Insufficient documentation

## 2015-01-08 DIAGNOSIS — Z791 Long term (current) use of non-steroidal anti-inflammatories (NSAID): Secondary | ICD-10-CM | POA: Diagnosis not present

## 2015-01-08 LAB — I-STAT CHEM 8, ED
BUN: 24 mg/dL — AB (ref 6–20)
CALCIUM ION: 1.19 mmol/L (ref 1.12–1.23)
CREATININE: 0.9 mg/dL (ref 0.44–1.00)
Chloride: 103 mmol/L (ref 101–111)
GLUCOSE: 123 mg/dL — AB (ref 70–99)
HCT: 42 % (ref 36.0–46.0)
HEMOGLOBIN: 14.3 g/dL (ref 12.0–15.0)
Potassium: 3.9 mmol/L (ref 3.5–5.1)
Sodium: 139 mmol/L (ref 135–145)
TCO2: 22 mmol/L (ref 0–100)

## 2015-01-08 LAB — DIFFERENTIAL
BASOS ABS: 0 10*3/uL (ref 0.0–0.1)
BASOS PCT: 0 % (ref 0–1)
Eosinophils Absolute: 0 10*3/uL (ref 0.0–0.7)
Eosinophils Relative: 0 % (ref 0–5)
Lymphocytes Relative: 12 % (ref 12–46)
Lymphs Abs: 1.2 10*3/uL (ref 0.7–4.0)
MONO ABS: 0.3 10*3/uL (ref 0.1–1.0)
Monocytes Relative: 3 % (ref 3–12)
NEUTROS ABS: 8.7 10*3/uL — AB (ref 1.7–7.7)
Neutrophils Relative %: 85 % — ABNORMAL HIGH (ref 43–77)

## 2015-01-08 LAB — PROTIME-INR
INR: 0.97 (ref 0.00–1.49)
PROTHROMBIN TIME: 13 s (ref 11.6–15.2)

## 2015-01-08 LAB — COMPREHENSIVE METABOLIC PANEL
ALK PHOS: 81 U/L (ref 38–126)
ALT: 23 U/L (ref 14–54)
AST: 21 U/L (ref 15–41)
Albumin: 4.2 g/dL (ref 3.5–5.0)
Anion gap: 12 (ref 5–15)
BUN: 21 mg/dL — ABNORMAL HIGH (ref 6–20)
CALCIUM: 9.4 mg/dL (ref 8.9–10.3)
CO2: 23 mmol/L (ref 22–32)
Chloride: 103 mmol/L (ref 101–111)
Creatinine, Ser: 0.9 mg/dL (ref 0.44–1.00)
GFR calc Af Amer: >60 mL/min (ref >60)
Glucose, Bld: 120 mg/dL — ABNORMAL HIGH (ref 70–99)
POTASSIUM: 4 mmol/L (ref 3.5–5.1)
SODIUM: 138 mmol/L (ref 135–145)
Total Bilirubin: 0.6 mg/dL (ref 0.3–1.2)
Total Protein: 6.6 g/dL (ref 6.5–8.1)

## 2015-01-08 LAB — APTT: aPTT: 28 seconds (ref 24–37)

## 2015-01-08 LAB — CBC
HCT: 37.7 % (ref 36.0–46.0)
HEMOGLOBIN: 12.9 g/dL (ref 12.0–15.0)
MCH: 29.5 pg (ref 26.0–34.0)
MCHC: 34.2 g/dL (ref 30.0–36.0)
MCV: 86.1 fL (ref 78.0–100.0)
Platelets: 224 10*3/uL (ref 150–400)
RBC: 4.38 MIL/uL (ref 3.87–5.11)
RDW: 14.7 % (ref 11.5–15.5)
WBC: 10.2 10*3/uL (ref 4.0–10.5)

## 2015-01-08 LAB — I-STAT TROPONIN, ED: TROPONIN I, POC: 0 ng/mL (ref 0.00–0.08)

## 2015-01-08 LAB — ETHANOL

## 2015-01-08 MED ORDER — ONDANSETRON HCL 4 MG/2ML IJ SOLN
4.0000 mg | Freq: Once | INTRAMUSCULAR | Status: AC
  Administered 2015-01-08: 4 mg via INTRAVENOUS
  Filled 2015-01-08: qty 2

## 2015-01-08 MED ORDER — HYDROMORPHONE HCL 2 MG PO TABS: 2.0000 mg | ORAL_TABLET | ORAL | Status: DC | PRN

## 2015-01-08 MED ORDER — IOHEXOL 350 MG/ML SOLN
50.0000 mL | Freq: Once | INTRAVENOUS | Status: AC | PRN
  Administered 2015-01-08: 50 mL via INTRAVENOUS

## 2015-01-08 MED ORDER — HYDROMORPHONE HCL 1 MG/ML IJ SOLN
1.0000 mg | Freq: Once | INTRAMUSCULAR | Status: AC
  Administered 2015-01-08: 1 mg via INTRAVENOUS
  Filled 2015-01-08: qty 1

## 2015-01-08 MED ORDER — KETOROLAC TROMETHAMINE 30 MG/ML IJ SOLN
30.0000 mg | Freq: Once | INTRAMUSCULAR | Status: AC
  Administered 2015-01-08: 30 mg via INTRAVENOUS
  Filled 2015-01-08: qty 1

## 2015-01-08 MED ORDER — METOCLOPRAMIDE HCL 5 MG/ML IJ SOLN
10.0000 mg | Freq: Once | INTRAMUSCULAR | Status: AC
  Administered 2015-01-08: 10 mg via INTRAVENOUS
  Filled 2015-01-08: qty 2

## 2015-01-08 MED ORDER — DIPHENHYDRAMINE HCL 50 MG/ML IJ SOLN
25.0000 mg | Freq: Once | INTRAMUSCULAR | Status: AC
  Administered 2015-01-08: 25 mg via INTRAVENOUS
  Filled 2015-01-08: qty 1

## 2015-01-08 MED ORDER — HYDROMORPHONE HCL 2 MG PO TABS: 1.0000 mg | ORAL_TABLET | ORAL | Status: DC | PRN

## 2015-01-08 MED ORDER — TRAMADOL HCL 50 MG PO TABS
50.0000 mg | ORAL_TABLET | Freq: Four times a day (QID) | ORAL | Status: DC | PRN
Start: 2015-01-08 — End: 2019-04-05

## 2015-01-08 NOTE — Discharge Instructions (Signed)

## 2015-01-08 NOTE — Code Documentation (Signed)
49yo female arriving to [facility] via private vehicle at 1218.  Patient reports that she was at work when at [place] she had sudden onset left facial tingling, headache and left eye blurred vision.  Patient had a recent craniotomy for unknown lesion with recent diagnosis of sarcoid.  Code stroke called in Triage.  Patient to CT.  Stroke team to the bedside.  NIHSS 2, see documentation for details and code stroke times.  Dr. Leonel Ramsay at the bedside.  Patient transported to radiology for CTA head and neck.  No acute stroke treatment at this time.  Bedside handoff with ED RN Junie Panning.

## 2015-01-08 NOTE — ED Notes (Signed)
Patient transported to CT for CTA 

## 2015-01-08 NOTE — ED Notes (Signed)
Pt. oob to the bathroom, gait steady.  

## 2015-01-08 NOTE — Consult Note (Signed)
Referring Physician: Roderic Palau    Chief Complaint: left side HA with decreased vision on the left field.   HPI:                                                                                                                                         [name] is an 49 y.o. female with history of Migraine HA and recent stereotactic left frontal craniectomy for excisional Bx of bone lesion and left frontal cranioplasty. Patient states she has migraine HA's which are frequent and usually behind the right eye with loss of vision in the right visual field. Today at 0930 AM she noted tingling in her left face and arm then left sided HA that is behind her eye.  She then noted loss of vision in the left visual field.  She became worried about the symptoms due to her usual migraine HA being on the right. She was brought to ED as a code stroke where initial CT was negative for acute bleed or stroke.  She was brought back to CT for CT angio of head and neck.   Date last known well: Date: 01/08/2015 Time last known well: Time: 09:30 tPA Given: No: recent craniotomy  Modified Rankin: Rankin Score=0    Past Medical History  Diagnosis Date  . IC (interstitial cystitis)   . RLS (restless legs syndrome)   . GERD (gastroesophageal reflux disease)   . Depression   . Hypothyroidism   . Environmental allergies   . IBS (irritable bowel syndrome)   . Frequency of urination   . Nocturia   . SUI (stress urinary incontinence, female)   . Erosion of suburethral sling   . Herpes simplex type 2 infection   . NSVD (normal spontaneous vaginal delivery)     x3  . Asthma   . Chronic bronchitis     "get it close to q yr" (09/18/2014)  . Migraine     "maybe once/month" (09/18/2014)  . Headache     "at least several times/wk" (09/18/2014)  . Seizures     "when I was a teenager"  . Chronic back pain     "whole spine"  . Anxiety   . Shortness of breath dyspnea     Exertion  . Sarcoid   . Heart murmur      "when I was a baby"  . Arthritis     "back, hands, neck" (09/18/2014)    Past Surgical History  Procedure Laterality Date  . Cesarean section  2000  . Pelvic laparoscopy  1990's    LYSIS ADHESIONS  . Tonsillectomy  1986  . Appendectomy  1985  . Anterior cervical decomp/discectomy fusion  2011  . Abdominal hysterectomy  2006  . Knee arthroscopy Right   . Lumbar fusion  12-04-2008    L4  -- S1  . Lumbar disc surgery  02-02-2007  RIGHT SIDE L5 -- S1  . Lynx retropubic suburethral sling  03-02-2007  . Tubal ligation  2001    hulka clip  . Pubovaginal sling N/A 11/30/2012    Procedure: Gaynelle Arabian;  Surgeon: Bernestine Amass, MD;  Location: [facility];  Service: Urology;  Laterality: N/A;  1 HR EXAM UNDER ANESTHESIA, EXCISION OF SUB URETHRAL MESH, CYSTO, HOD   . Cysto with hydrodistension N/A 11/30/2012    Procedure: CYSTOSCOPY/HYDRODISTENSION;  Surgeon: Bernestine Amass, MD;  Location: [facility];  Service: Urology;  Laterality: N/A;  . Video bronchoscopy with endobronchial ultrasound N/A 09/21/2014    Procedure: VIDEO BRONCHOSCOPY WITH ENDOBRONCHIAL ULTRASOUND;  Surgeon: Collene Gobble, MD;  Location: [facility];  Service: Thoracic;  Laterality: N/A;  . Mediastinoscopy N/A 10/26/2014    Procedure: MEDIASTINOSCOPY;  Surgeon: Melrose Nakayama, MD;  Location: [facility] OR;  Service: Thoracic;  Laterality: N/A;  . Back surgery  2008    X 2 in 2010  . Craniectomy for depressed skull fracture Left 12/05/2014    Procedure: Left frontal stereotactic craniectomy for biopsy of skull lesion;  Surgeon: Consuella Lose, MD;  Location: [facility];  Service: Neurosurgery;  Laterality: Left;  Left frontal stereotactic craniectomy for biopsy of skull lesion    Family History  Problem Relation Age of Onset  . Diabetes Mother   . Hypertension Mother   . Heart disease Mother   . Cancer Father     LUNG AND BRAIN  . Hypertension Father   . Heart disease Maternal  Grandmother   . Cancer Maternal Grandfather     LUNG  . Heart disease Paternal Grandmother   . Cancer Paternal Grandfather    Social History:  reports that she quit smoking about a year ago. Her smoking use included Cigarettes. She has a 7.5 pack-year smoking history. She has never used smokeless tobacco. She reports that she drinks alcohol. She reports that she does not use illicit drugs.  Allergies:  Allergies  Allergen Reactions  . Bactrim [Sulfamethoxazole-Trimethoprim] Other (See Comments)    Fever   . Percocet [Oxycodone-Acetaminophen] Itching  . Vicodin [Hydrocodone-Acetaminophen] Itching    Medications:                                                                                                                           No current facility-administered medications for this encounter.   Current Outpatient Prescriptions  Medication Sig Dispense Refill  . albuterol (PROVENTIL HFA;VENTOLIN HFA) 108 (90 BASE) MCG/ACT inhaler Inhale 2 puffs into the lungs every 6 (six) hours as needed. For shortness of breath 3.7 g 1  . diazepam (VALIUM) 2 MG tablet Take 2 mg by mouth at bedtime.    . dicyclomine (BENTYL) 20 MG tablet Take 20 mg by mouth every 6 (six) hours.    Marland Kitchen HYDROmorphone (DILAUDID) 2 MG tablet Take 0.5-1 tablets (1-2 mg total) by mouth every 3 (three) hours as needed for severe pain.  60 tablet 0  . HYDROmorphone (DILAUDID) 4 MG tablet Take 1 tablet (4 mg total) by mouth every 4 (four) hours as needed for severe pain. 65 tablet 0  . levothyroxine (SYNTHROID, LEVOTHROID) 137 MCG tablet Take 137 mcg by mouth every morning.     Marland Kitchen LORazepam (ATIVAN) 1 MG tablet Take 1 tablet (1 mg total) by mouth 3 times/day as needed-between meals & bedtime for anxiety. 30 tablet 0  . meloxicam (MOBIC) 7.5 MG tablet Take 7.5 mg by mouth 2 (two) times daily as needed for pain.   0  . naproxen sodium (ANAPROX) 220 MG tablet Take 440 mg by mouth 2 (two) times daily as needed (for pain).     Marland Kitchen  omeprazole (PRILOSEC) 40 MG capsule Take 40 mg by mouth at bedtime.    Marland Kitchen PARoxetine (PAXIL) 20 MG tablet Take 20 mg by mouth at bedtime.    . predniSONE (DELTASONE) 10 MG tablet 2 daily 60 tablet 2  . rOPINIRole (REQUIP) 2 MG tablet Take 2 mg by mouth at bedtime.    . traMADol (ULTRAM) 50 MG tablet Take 1 tablet (50 mg total) by mouth every 6 (six) hours as needed. (Patient taking differently: Take 50 mg by mouth every 6 (six) hours as needed for moderate pain. ) 65 tablet 1  . traZODone (DESYREL) 50 MG tablet Take 50 mg by mouth at bedtime.    . valACYclovir (VALTREX) 500 MG tablet Take 500 mg by mouth at bedtime.       ROS:                                                                                                                                       History obtained from the patient  General ROS: negative for - chills, fatigue, fever, night sweats, weight gain or weight loss Psychological ROS: negative for - behavioral disorder, hallucinations, memory difficulties, mood swings or suicidal ideation Ophthalmic ROS: positive for - blurry vision,  loss of vision ENT ROS: negative for - epistaxis, nasal discharge, oral lesions, sore throat, tinnitus or vertigo Allergy and Immunology ROS: negative for - hives or itchy/watery eyes Hematological and Lymphatic ROS: negative for - bleeding problems, bruising or swollen lymph nodes Endocrine ROS: negative for - galactorrhea, hair pattern changes, polydipsia/polyuria or temperature intolerance Respiratory ROS: negative for - cough, hemoptysis, shortness of breath or wheezing Cardiovascular ROS: negative for - chest pain, dyspnea on exertion, edema or irregular heartbeat Gastrointestinal ROS: negative for - abdominal pain, diarrhea, hematemesis, nausea/vomiting or stool incontinence Genito-Urinary ROS: negative for - dysuria, hematuria, incontinence or urinary frequency/urgency Musculoskeletal ROS: negative for - joint swelling or muscular  weakness Neurological ROS: as noted in HPI Dermatological ROS: negative for rash and skin lesion changes  Neurologic Examination:  Blood pressure 129/85, pulse 82, temperature 98.3 F (36.8 C), temperature source Oral, resp. rate 20, last menstrual period 06/03/2012, SpO2 94 %.  HEENT-  Normocephalic, no lesions, without obvious abnormality.  Normal external eye and conjunctiva.  Normal TM's bilaterally.  Normal auditory canals and external ears. Normal external nose, mucus membranes and septum.  Normal pharynx. Cardiovascular- S1, S2 normal, pulses palpable throughout   Lungs- chest clear, no wheezing, rales, normal symmetric air entry Abdomen- normal findings: bowel sounds normal Extremities- no edema Lymph-no adenopathy palpable Musculoskeletal-no joint tenderness, deformity or swelling Skin-warm and dry, no hyperpigmentation, vitiligo, or suspicious lesions  Neurological Examination Mental Status: Alert, oriented, thought content appropriate.  Speech fluent without evidence of aphasia.  Able to follow 3 step commands without difficulty. Cranial Nerves: II: Discs flat bilaterally; Visual fields shows a left hemianopsia, pupils equal, round, reactive to light and accommodation III,IV, VI: ptosis not present, extra-ocular motions intact bilaterally V,VII: smile symmetric, facial light touch sensation normal bilaterally VIII: hearing normal bilaterally IX,X: uvula rises symmetrically XI: bilateral shoulder shrug XII: midline tongue extension Motor: Right : Upper extremity   5/5    Left:     Upper extremity   5/5  Lower extremity   5/5     Lower extremity   5/5 Tone and bulk:normal tone throughout; no atrophy noted Sensory: Pinprick and light touch intact throughout, bilaterally Deep Tendon Reflexes: 2+ and symmetric throughout Plantars: Right: downgoing   Left:  downgoing Cerebellar: normal finger-to-nose, normal rapid alternating movements and normal heel-to-shin test Gait: not tested due to multiple leads.        Lab Results: Basic Metabolic Panel:  Recent Labs Lab 01/07/15 0818 01/08/15 1249  NA 138 139  K 4.0 3.9  CL 105 103  CO2 28  --   GLUCOSE 92 123*  BUN 23 24*  CREATININE 0.99 0.90  CALCIUM 9.2  --     Liver Function Tests:  Recent Labs Lab 01/07/15 0818  AST 16  ALT 20  ALKPHOS 75  BILITOT 0.2  PROT 6.5  ALBUMIN 4.0   No results for input(s): LIPASE, AMYLASE in the last 168 hours. No results for input(s): AMMONIA in the last 168 hours.  CBC:  Recent Labs Lab 01/08/15 1242 01/08/15 1249  WBC 10.2  --   NEUTROABS 8.7*  --   HGB 12.9 14.3  HCT 37.7 42.0  MCV 86.1  --   PLT 224  --     Cardiac Enzymes: No results for input(s): CKTOTAL, CKMB, CKMBINDEX, TROPONINI in the last 168 hours.  Lipid Panel:  Recent Labs Lab 01/07/15 0818  CHOL 215*  TRIG 279.0*  HDL 55.00  CHOLHDL 4  VLDL 55.8*    CBG: No results for input(s): GLUCAP in the last 168 hours.  Microbiology: Results for orders placed or performed during the hospital encounter of 12/05/14  MRSA PCR Screening     Status: None   Collection Time: 12/05/14  6:00 PM  Result Value Ref Range Status   MRSA by PCR NEGATIVE NEGATIVE Final    Comment:        The GeneXpert MRSA Assay (FDA approved for NASAL specimens only), is one component of a comprehensive MRSA colonization surveillance program. It is not intended to diagnose MRSA infection nor to guide or monitor treatment for MRSA infections.     Coagulation Studies: No results for input(s): LABPROT, INR in the last 72 hours.  Imaging: No results found.     Assessment and plan  discussed with with attending physician and they are in agreement.    Etta Quill PA-C [facility] [phone]  01/08/2015, 12:59 PM   Assessment: 49 y.o. female with left sided  tingling, field cut. With history of migraines, I feel that this is liekly complicated migraine. She is not a candidate for IR and IV tpa is contraindicated given recent surgery. If MRI is negative, would treat as migraine.   Stroke Risk Factors - none  1) MRI brain 2) If negative, would treat as migraine, could use IV compazine 10mg , benadryl 25mg , toradol 30mg    Roland Rack, MD [facility] [phone]  If 7pm- 7am, please page neurology on call as listed in [place].

## 2015-01-08 NOTE — Telephone Encounter (Signed)
Called [facility] and spoke with Caryl Pina Informed her of message from pt and TP's recs to go to the [place] voiced her understanding Nothing further needed; will sign off

## 2015-01-08 NOTE — ED Notes (Signed)
Reported to Dr. Roderic Palau , pt. s headache is a 8/10

## 2015-01-08 NOTE — ED Provider Notes (Signed)
CSN: [id number]     Arrival date & time 01/08/15  1218 History   First MD Initiated Contact with Patient 01/08/15 1240     Chief Complaint  Patient presents with  . Headache  . Code Stroke     (Consider location/radiation/quality/duration/timing/severity/associated sxs/prior Treatment) Patient is a 49 y.o. female presenting with headaches. The history is provided by the patient. No language interpreter was used.  Headache Pain location:  L parietal Severity currently:  8/10 Severity at highest:  8/10 Duration:  4 hours Timing:  Constant Progression:  Partially resolved Chronicity:  New Similar to prior headaches: yes   Context: not activity   Relieved by:  Nothing Worsened by:  Nothing Ineffective treatments:  None tried Associated symptoms: blurred vision   Associated symptoms: no weakness   Pt had a craniotomy 3 weeks ago for a sarcoid lesion on her brain.   Past Medical History  Diagnosis Date  . IC (interstitial cystitis)   . RLS (restless legs syndrome)   . GERD (gastroesophageal reflux disease)   . Depression   . Hypothyroidism   . Environmental allergies   . IBS (irritable bowel syndrome)   . Frequency of urination   . Nocturia   . SUI (stress urinary incontinence, female)   . Erosion of suburethral sling   . Herpes simplex type 2 infection   . NSVD (normal spontaneous vaginal delivery)     x3  . Asthma   . Chronic bronchitis     "get it close to q yr" (09/18/2014)  . Migraine     "maybe once/month" (09/18/2014)  . Headache     "at least several times/wk" (09/18/2014)  . Seizures     "when I was a teenager"  . Chronic back pain     "whole spine"  . Anxiety   . Shortness of breath dyspnea     Exertion  . Sarcoid   . Heart murmur     "when I was a baby"  . Arthritis     "back, hands, neck" (09/18/2014)   Past Surgical History  Procedure Laterality Date  . Cesarean section  2000  . Pelvic laparoscopy  1990's    LYSIS ADHESIONS  . Tonsillectomy   1986  . Appendectomy  1985  . Anterior cervical decomp/discectomy fusion  2011  . Abdominal hysterectomy  2006  . Knee arthroscopy Right   . Lumbar fusion  12-04-2008    L4  -- S1  . Lumbar disc surgery  02-02-2007    RIGHT SIDE L5 -- S1  . Lynx retropubic suburethral sling  03-02-2007  . Tubal ligation  2001    hulka clip  . Pubovaginal sling N/A 11/30/2012    Procedure: Gaynelle Arabian;  Surgeon: Bernestine Amass, MD;  Location: [facility];  Service: Urology;  Laterality: N/A;  1 HR EXAM UNDER ANESTHESIA, EXCISION OF SUB URETHRAL MESH, CYSTO, HOD   . Cysto with hydrodistension N/A 11/30/2012    Procedure: CYSTOSCOPY/HYDRODISTENSION;  Surgeon: Bernestine Amass, MD;  Location: [facility];  Service: Urology;  Laterality: N/A;  . Video bronchoscopy with endobronchial ultrasound N/A 09/21/2014    Procedure: VIDEO BRONCHOSCOPY WITH ENDOBRONCHIAL ULTRASOUND;  Surgeon: Collene Gobble, MD;  Location: [place];  Service: Thoracic;  Laterality: N/A;  . Mediastinoscopy N/A 10/26/2014    Procedure: MEDIASTINOSCOPY;  Surgeon: Melrose Nakayama, MD;  Location: [place];  Service: Thoracic;  Laterality: N/A;  . Back surgery  2008    X 2  in 2010  . Craniectomy for depressed skull fracture Left 12/05/2014    Procedure: Left frontal stereotactic craniectomy for biopsy of skull lesion;  Surgeon: Consuella Lose, MD;  Location: [place] [facility];  Service: Neurosurgery;  Laterality: Left;  Left frontal stereotactic craniectomy for biopsy of skull lesion   Family History  Problem Relation Age of Onset  . Diabetes Mother   . Hypertension Mother   . Heart disease Mother   . Cancer Father     LUNG AND BRAIN  . Hypertension Father   . Heart disease Maternal Grandmother   . Cancer Maternal Grandfather     LUNG  . Heart disease Paternal Grandmother   . Cancer Paternal Grandfather    History  Substance Use Topics  . Smoking status: Former Smoker -- 0.50 packs/day for 15 years     Types: Cigarettes    Quit date: 01/15/2014  . Smokeless tobacco: Never Used  . Alcohol Use: 0.0 oz/week    0 Standard drinks or equivalent per week     Comment: ocassional   OB History    Gravida Para Term Preterm AB TAB SAB Ectopic Multiple Living   4 4        4      Review of Systems  Eyes: Positive for blurred vision.  Neurological: Positive for headaches. Negative for weakness.  All other systems reviewed and are negative.     Allergies  Bactrim; Percocet; and Vicodin  Home Medications   Prior to Admission medications   Medication Sig Start Date End Date Taking? Authorizing Provider  albuterol (PROVENTIL HFA;VENTOLIN HFA) 108 (90 BASE) MCG/ACT inhaler Inhale 2 puffs into the lungs every 6 (six) hours as needed. For shortness of breath 04/25/13   Elayne Snare, MD  diazepam (VALIUM) 2 MG tablet Take 2 mg by mouth at bedtime.    Historical Provider, MD  dicyclomine (BENTYL) 20 MG tablet Take 20 mg by mouth every 6 (six) hours.    Historical Provider, MD  HYDROmorphone (DILAUDID) 2 MG tablet Take 0.5-1 tablets (1-2 mg total) by mouth every 3 (three) hours as needed for severe pain. 12/07/14   Kary Kos, MD  HYDROmorphone (DILAUDID) 4 MG tablet Take 1 tablet (4 mg total) by mouth every 4 (four) hours as needed for severe pain. 11/16/14   Theodis Blaze, MD  levothyroxine (SYNTHROID, LEVOTHROID) 137 MCG tablet Take 137 mcg by mouth every morning.     Historical Provider, MD  LORazepam (ATIVAN) 1 MG tablet Take 1 tablet (1 mg total) by mouth 3 times/day as needed-between meals & bedtime for anxiety. 11/16/14   Theodis Blaze, MD  meloxicam (MOBIC) 7.5 MG tablet Take 7.5 mg by mouth 2 (two) times daily as needed for pain.  06/27/14   Historical Provider, MD  naproxen sodium (ANAPROX) 220 MG tablet Take 440 mg by mouth 2 (two) times daily as needed (for pain).     Historical Provider, MD  omeprazole (PRILOSEC) 40 MG capsule Take 40 mg by mouth at bedtime.    Historical Provider, MD  PARoxetine  (PAXIL) 20 MG tablet Take 20 mg by mouth at bedtime.    Historical Provider, MD  predniSONE (DELTASONE) 10 MG tablet 2 daily 01/07/15   Tammy S Parrett, NP  rOPINIRole (REQUIP) 2 MG tablet Take 2 mg by mouth at bedtime.    Historical Provider, MD  traMADol (ULTRAM) 50 MG tablet Take 1 tablet (50 mg total) by mouth every 6 (six) hours as needed. Patient taking differently:  Take 50 mg by mouth every 6 (six) hours as needed for moderate pain.  11/16/14   Theodis Blaze, MD  traZODone (DESYREL) 50 MG tablet Take 50 mg by mouth at bedtime.    Historical Provider, MD  valACYclovir (VALTREX) 500 MG tablet Take 500 mg by mouth at bedtime.    Historical Provider, MD   BP 128/78 mmHg  Pulse 81  Temp(Src) 98.3 F (36.8 C) (Oral)  Resp 11  Ht 5\' 4"  (1.626 m)  Wt 178 lb (80.74 kg)  BMI 30.54 kg/m2  SpO2 96%  LMP 06/03/2012 Physical Exam  Constitutional: She is oriented to person, place, and time. She appears well-developed and well-nourished.  HENT:  Head: Normocephalic.  Mouth/Throat: Oropharynx is clear and moist.  Eyes: Conjunctivae and EOM are normal. Pupils are equal, round, and reactive to light.  Neck: Normal range of motion.  Cardiovascular: Normal rate and regular rhythm.   Pulmonary/Chest: Effort normal.  Abdominal: She exhibits no distension.  Musculoskeletal: Normal range of motion.  Neurological: She is alert and oriented to person, place, and time. She has normal reflexes.  Skin: Skin is warm.  Psychiatric: She has a normal mood and affect.  Nursing note and vitals reviewed.   ED Course  Procedures (including critical care time) Labs Review Labs Reviewed  DIFFERENTIAL - Abnormal; Notable for the following:    Neutrophils Relative % 85 (*)    Neutro Abs 8.7 (*)    All other components within normal limits  COMPREHENSIVE METABOLIC PANEL - Abnormal; Notable for the following:    Glucose, Bld 120 (*)    BUN 21 (*)    All other components within normal limits  I-STAT CHEM 8, ED  - Abnormal; Notable for the following:    BUN 24 (*)    Glucose, Bld 123 (*)    All other components within normal limits  ETHANOL  PROTIME-INR  APTT  CBC  URINE RAPID DRUG SCREEN (HOSP PERFORMED)  URINALYSIS, ROUTINE W REFLEX MICROSCOPIC  I-STAT TROPOININ, ED    Imaging Review Ct Head Wo Contrast  01/08/2015   CLINICAL DATA:  Headache and left-sided facial numbness with blurry vision.  EXAM: CT HEAD WITHOUT CONTRAST  TECHNIQUE: Contiguous axial images were obtained from the base of the skull through the vertex without intravenous contrast.  COMPARISON:  12/06/2014  FINDINGS: Skull and Sinuses:Left frontal cranioplasty covered with a plate. Previously seen postoperative gas has resolved.  Clear sinuses and mastoids.  Orbits: Thin appearing bilateral optic nerves.  No acute findings.  Brain: No evidence of acute infarction, hemorrhage, hydrocephalus, or mass lesion/mass effect.  These results were called by telephone at the time of interpretation on 01/08/2015 at 1:00 pm to Dr. Leonel Ramsay, who verbally acknowledged these results.  IMPRESSION: 1. Normal intracranial imaging. 2. Unremarkable left frontal cranioplasty.   Electronically Signed   By: Monte Fantasia M.D.   On: 01/08/2015 13:01     EKG Interpretation   Date/Time:  Tuesday Jan 08 2015 13:16:35 EDT Ventricular Rate:  84 PR Interval:  154 QRS Duration: 102 QT Interval:  378 QTC Calculation: 447 R Axis:   58 Text Interpretation:  Sinus rhythm RSR' in V1 or V2, probably normal  variant Confirmed by ZAMMIT  MD, Broadus John [phone]) on 01/08/2015 1:59:09 PM      MDM  Pt seen by Dr. Roderic Palau,   Stroke MD here to see pt.   They advised MRi.  May treat for migraine and discharge if  Advised treat with torodol,  reglan and benadryl for migraine.    Re exam no change in neuro exam. Final diagnoses:  Migraine variant without intractability    Meds ordered this encounter  Medications  . iohexol (OMNIPAQUE) 350 MG/ML injection 50 mL     Sig:   . HYDROmorphone (DILAUDID) injection 1 mg    Sig:   . ondansetron (ZOFRAN) injection 4 mg    Sig:   . metoCLOPramide (REGLAN) injection 10 mg    Sig:   . diphenhydrAMINE (BENADRYL) injection 25 mg    Sig:   . predniSONE (DELTASONE) 20 MG tablet    Sig: Take 20 mg by mouth daily.  Marland Kitchen ketorolac (TORADOL) 30 MG/ML injection 30 mg    Sig:   . buPROPion (WELLBUTRIN) 100 MG tablet    Sig: Take 100 mg by mouth at bedtime.  . Black Cohosh-SoyIsoflav-Magnol (ESTROVEN MENOPAUSE RELIEF) CAPS    Sig: Take 1 tablet by mouth daily.  . traMADol (ULTRAM) 50 MG tablet    Sig: Take 1 tablet (50 mg total) by mouth every 6 (six) hours as needed.    Dispense:  15 tablet    Refill:  0    Order Specific Question:  Supervising Provider    Answer:  MILLER, BRIAN [[id number]]  . HYDROmorphone (DILAUDID) 2 MG tablet    Sig: Take 0.5-1 tablets (1-2 mg total) by mouth every 3 (three) hours as needed for severe pain.    Dispense:  8 tablet    Refill:  0    Order Specific Question:  Supervising Provider    Answer:  MILLER, BRIAN [[id number]]  . HYDROmorphone (DILAUDID) 2 MG tablet    Sig: Take 1 tablet (2 mg total) by mouth every 4 (four) hours as needed for severe pain.    Dispense:  8 tablet    Refill:  0    Order Specific Question:  Supervising Provider    Answer:  Noemi Chapel [place], PA-C 01/08/15 [place], [place] 01/08/15 [place], [place] 01/09/15 [phone]

## 2015-01-08 NOTE — Telephone Encounter (Signed)
Per TP pt states she is having a severe HA, LT facial pain, visual changes. Per TP pt has been instructed to go to the [facility] ED.

## 2015-01-08 NOTE — ED Notes (Signed)
Pt in c/o left sided headache, intermittent tingling in face, pt states she had a craniotomy on 12/05/14, had a history of similar headaches prior to surgery but has not had any since, reports dizziness, blurred vision to left eye- symptoms started suddenly around 930am today

## 2015-01-09 NOTE — Progress Notes (Signed)
Reviewed and agree with assessment/plan. 

## 2015-02-05 ENCOUNTER — Ambulatory Visit (INDEPENDENT_AMBULATORY_CARE_PROVIDER_SITE_OTHER): Payer: BLUE CROSS/BLUE SHIELD | Admitting: Pulmonary Disease

## 2015-02-05 DIAGNOSIS — D869 Sarcoidosis, unspecified: Secondary | ICD-10-CM

## 2015-02-05 LAB — PULMONARY FUNCTION TEST
DL/VA % PRED: 89 %
DL/VA: 4.63 ml/min/mmHg/L
DLCO UNC: 22.79 ml/min/mmHg
DLCO unc % pred: 80 %
FEF 25-75 POST: 3.3 L/s
FEF 25-75 PRE: 2.32 L/s
FEF2575-%Change-Post: 42 %
FEF2575-%PRED-PRE: 77 %
FEF2575-%Pred-Post: 110 %
FEV1-%Change-Post: 10 %
FEV1-%PRED-POST: 94 %
FEV1-%PRED-PRE: 85 %
FEV1-Post: 2.96 L
FEV1-Pre: 2.68 L
FEV1FVC-%Change-Post: 2 %
FEV1FVC-%Pred-Pre: 96 %
FEV6-%CHANGE-POST: 6 %
FEV6-%Pred-Post: 95 %
FEV6-%Pred-Pre: 88 %
FEV6-POST: 3.66 L
FEV6-Pre: 3.42 L
FEV6FVC-%Change-Post: 0 %
FEV6FVC-%PRED-POST: 102 %
FEV6FVC-%Pred-Pre: 103 %
FVC-%Change-Post: 7 %
FVC-%PRED-POST: 93 %
FVC-%PRED-PRE: 86 %
FVC-POST: 3.7 L
FVC-Pre: 3.42 L
POST FEV6/FVC RATIO: 99 %
PRE FEV6/FVC RATIO: 100 %
Post FEV1/FVC ratio: 80 %
Pre FEV1/FVC ratio: 78 %
RV % PRED: 100 %
RV: 1.91 L
TLC % pred: 95 %
TLC: 5.27 L

## 2015-02-05 NOTE — Progress Notes (Signed)
PFT done today. 

## 2015-02-08 ENCOUNTER — Telehealth: Payer: Self-pay | Admitting: Pulmonary Disease

## 2015-02-08 NOTE — Telephone Encounter (Signed)
PFT 02/05/15 >> FEV1 2.96 (94%), FEV1% 80, TLC 5.27 (95%), DLCO 80%, borderline BD   Will have my nurse inform pt that PFT was normal.  Will discuss in more detail at next visit with Tammy Parrett on 02/12/15.

## 2015-02-12 ENCOUNTER — Ambulatory Visit (INDEPENDENT_AMBULATORY_CARE_PROVIDER_SITE_OTHER): Payer: BLUE CROSS/BLUE SHIELD | Admitting: Adult Health

## 2015-02-12 ENCOUNTER — Encounter: Payer: Self-pay | Admitting: Adult Health

## 2015-02-12 VITALS — BP 136/84 | HR 79 | Temp 98.1°F | Ht 67.0 in | Wt 178.0 lb

## 2015-02-12 DIAGNOSIS — D869 Sarcoidosis, unspecified: Secondary | ICD-10-CM | POA: Diagnosis not present

## 2015-02-12 MED ORDER — BECLOMETHASONE DIPROPIONATE 40 MCG/ACT IN AERS: 2.0000 | INHALATION_SPRAY | Freq: Two times a day (BID) | RESPIRATORY_TRACT | Status: DC

## 2015-02-12 NOTE — Telephone Encounter (Signed)
Results have been explained to patient, pt expressed understanding. Nothing further needed.  

## 2015-02-12 NOTE — Patient Instructions (Addendum)
Keep yearly eye exams.  Continue on Prednisone 20mg . daily for now .  Follow up with Neurology at [facility] as planned.  Follow up with Dr. Halford Chessman  In 6-8 weeks and As needed. Trial of QVAR 2 puffs Twice daily   Please contact office for sooner follow up if symptoms do not improve or worsen or seek emergency care

## 2015-02-13 NOTE — Progress Notes (Signed)
Reviewed and agree with assessment/plan. 

## 2015-02-13 NOTE — Progress Notes (Signed)
Subjective:    Patient ID: [name], female    DOB: [birth year], 49 y.o.   MRN: [id number]  HPI 49 yo female with sarcoidosis.   TESTS: 09/18/14 CT head >> 12 x 6 mm Lt frontal lytic lesion 09/18/14 MRI brain >> 12 mm Lt frontal lytic lesion 09/18/14 Bone scan >> no other lesions 09/19/14 CT chest >> Rt paratracheal LAN 1.5 cm, Rt subcarinal LAN 2 cm, Rt hilar LAN 1.4 cm, Lt hilar LAN 1.7 cm 09/19/14 CT abd/pelvis >> 1.6 cm LAN at gastrohepatic ligament, 1.3 cm LAN at portal vein, 0.9 cm LAN Rt renal artery 10/26/14 Mediastinoscopy >> granulomas 11/12/14 Start prednisone 12/05/14 Skull lesion bx >non necrotizing granulomas    01/01/15 Follow up : Sarcoid  Pt returns for a 1 month for sarcoid .  She underwent mediastinoscopy for mediastinal /hilar adenopathy after FOB was non diagnostic .  This was positive for granulomas consistent with sarcoid with elevated ACE level at 76.  She was started on prednisone 40mg  but was unable to tolerate dose due to high b/p, headache and  Jitteriness.  She underwent bx of skull lesion on 4/6 that showed non necrotizing granulomas.  She also had joint pain , seen at [facility]  . No records at this time Pt says was told to let pulmonary manage sarcoid  She says she is doing okay , remains on prednisone 20mg  daily  Worried about being on chronic steroids .  No cough . Does get winded at times.  Has headaches and eye pressure  No chest pain, orthopnea, rash, orthopnea , edema or fever.  ACE level is lower 76>26.  >>cont on pred 20mg  daily    02/13/2015 Follow up Sarcoid  Pt returns for 1 month follow up  Had PFT on 6/7 with FEV1 85%, ratio 78, 10% BD change,  Mid flows 77%, +BD response , DLCO 80%.  Says has does get winded from time to time, occasional cough and wheeze.  Under a lot of stress with work, home disabled husband, special needs son.  Does not feel good, frequent headaches. Martin Majestic to ER with HA and tingling in arm and face. MRI  brain 01/08/15 with neg  For acute infarct. No skull lesion identified, post op bx noted.  She is to see [facility] in August, declined local neuro appointment.  Says ha come and go , has ha today on left with facial and arm tingling.  No speech/visual changes.  Remains on prednisone 20mg  daily .      Review of Systems Constitutional:   No  weight loss, night sweats,  Fevers, chills, + fatigue, or  lassitude.  HEENT:      Difficulty swallowing,  Tooth/dental problems, or  Sore throat,                No sneezing, itching, ear ache, nasal congestion, post nasal drip,   CV:  No chest pain,  Orthopnea, PND, swelling in lower extremities, anasarca, dizziness, palpitations, syncope.   GI  No heartburn, indigestion, abdominal pain, nausea, vomiting, diarrhea, change in bowel habits, loss of appetite, bloody stools.   Resp: .  No excess mucus, no productive cough,  No non-productive cough,  No coughing up of blood.  No change in color of mucus.  No wheezing.  No chest wall deformity  Skin: no rash or lesions.  GU: no dysuria, change in color of urine, no urgency or frequency.  No flank pain, no hematuria  MS:  +joint pain . No swelling.   Psych:  +change in mood or affect. No depression or anxiety.  No memory loss.         Objective:   Physical Exam GEN: A/Ox3; pleasant , NAD, well nourished   HEENT:  [place]/AT,  EACs-clear, TMs-wnl, NOSE-clear, THROAT-clear, no lesions, no postnasal drip or exudate noted.   NECK:  Supple w/ fair ROM; no JVD; normal carotid impulses w/o bruits; no thyromegaly or nodules palpated; no lymphadenopathy.  RESP  Clear  P & A; w/o, wheezes/ rales/ or rhonchi.no accessory muscle use, no dullness to percussion  CARD:  RRR, no m/r/g  , no peripheral edema, pulses intact, no cyanosis or clubbing.  GI:   Soft & nt; nml bowel sounds; no organomegaly or masses detected.  Musco: Warm bil, no deformities or joint swelling noted.   Neuro: alert, no focal deficits  noted. , CN 2-12 intact, MAEW, nml grips   Skin: Warm, no lesions or rashes   CXR 01/01/15  No active cardiopulmonary disease or interval change. Mediastinal and hilar adenopathy compatible with history of sarcoidosis.     Assessment & Plan:

## 2015-02-13 NOTE — Assessment & Plan Note (Signed)
Sarcoid with mediastinal Kenney Houseman adenopahty  And skull lesion s/p bx +granulomas  Recent MRI showed no skull lesion .  Pt continues to have headaches. ? Unclear etiology has hx of MHA vs tension ha.  PFT essentially normal , ? Mild aiflow obstruction in mid flows  Trial of QVAR  Have her return in 6 weeks ,-have held pred at 20mg  due to skull lesion ? Neuro sarcoid  Until she is seen by neuro .   Plan  Keep yearly eye exams.  Continue on Prednisone 20mg . daily for now .  Follow up with Neurology at [facility] as planned.  Follow up with Dr. Halford Chessman  In 6-8 weeks and As needed. Trial of QVAR 2 puffs Twice daily   Please contact office for sooner follow up if symptoms do not improve or worsen or seek emergency care

## 2015-04-17 ENCOUNTER — Encounter: Payer: Self-pay | Admitting: Pulmonary Disease

## 2015-04-17 ENCOUNTER — Ambulatory Visit (INDEPENDENT_AMBULATORY_CARE_PROVIDER_SITE_OTHER): Payer: BLUE CROSS/BLUE SHIELD | Admitting: Pulmonary Disease

## 2015-04-17 ENCOUNTER — Other Ambulatory Visit (INDEPENDENT_AMBULATORY_CARE_PROVIDER_SITE_OTHER): Payer: BLUE CROSS/BLUE SHIELD

## 2015-04-17 VITALS — BP 116/84 | HR 100 | Ht 65.0 in | Wt 187.4 lb

## 2015-04-17 DIAGNOSIS — D860 Sarcoidosis of lung: Secondary | ICD-10-CM

## 2015-04-17 DIAGNOSIS — J4520 Mild intermittent asthma, uncomplicated: Secondary | ICD-10-CM | POA: Diagnosis not present

## 2015-04-17 LAB — CBC WITH DIFFERENTIAL/PLATELET
Basophils Absolute: 0 10*3/uL (ref 0.0–0.1)
Basophils Relative: 0.4 % (ref 0.0–3.0)
EOS ABS: 0.1 10*3/uL (ref 0.0–0.7)
EOS PCT: 1.5 % (ref 0.0–5.0)
HCT: 37.9 % (ref 36.0–46.0)
Hemoglobin: 13.1 g/dL (ref 12.0–15.0)
LYMPHS ABS: 2.7 10*3/uL (ref 0.7–4.0)
Lymphocytes Relative: 28.1 % (ref 12.0–46.0)
MCHC: 34.6 g/dL (ref 30.0–36.0)
MCV: 87.1 fl (ref 78.0–100.0)
MONO ABS: 0.7 10*3/uL (ref 0.1–1.0)
Monocytes Relative: 7.7 % (ref 3.0–12.0)
NEUTROS PCT: 62.3 % (ref 43.0–77.0)
Neutro Abs: 6 10*3/uL (ref 1.4–7.7)
Platelets: 265 10*3/uL (ref 150.0–400.0)
RBC: 4.36 Mil/uL (ref 3.87–5.11)
RDW: 13.6 % (ref 11.5–15.5)
WBC: 9.6 10*3/uL (ref 4.0–10.5)

## 2015-04-17 LAB — COMPREHENSIVE METABOLIC PANEL
ALT: 14 U/L (ref 0–35)
AST: 15 U/L (ref 0–37)
Albumin: 4.1 g/dL (ref 3.5–5.2)
Alkaline Phosphatase: 73 U/L (ref 39–117)
BUN: 22 mg/dL (ref 6–23)
CHLORIDE: 104 meq/L (ref 96–112)
CO2: 26 meq/L (ref 19–32)
Calcium: 9 mg/dL (ref 8.4–10.5)
Creatinine, Ser: 1.07 mg/dL (ref 0.40–1.20)
GFR: 58.06 mL/min — ABNORMAL LOW (ref >60.00)
GLUCOSE: 94 mg/dL (ref 70–99)
Potassium: 4.1 mEq/L (ref 3.5–5.1)
SODIUM: 137 meq/L (ref 135–145)
Total Bilirubin: 0.2 mg/dL (ref 0.2–1.2)
Total Protein: 6.3 g/dL (ref 6.0–8.3)

## 2015-04-17 NOTE — Progress Notes (Signed)
Chief Complaint  Patient presents with  . Follow-up    Pt states that breathing has been "so-so" since last seen by TP. using QVAR prn - reports good tolerance.     History of Present Illness: [name] is a 49 y.o. female with sarcoidosis involving the lungs and skull.  She remains on prednisone 20 mg daily.  She still gets episode of cough.  She gets winded intermittently with activity.  She also feels short of breath when bending over.  She snores occasionally, but otherwise feels likes she sleeps okay.  She has noticed a burning and irritation in her Rt eye.  She feels her eye is sticky and she gets black spots in her eyes intermittently.  She has not seen her eye doctor since January.  She was seen by Dr. Golden Circle with rheumatology due to concern for inflammatory arthritis versus sarcoidosis causing her polyarthralgia.  She was seen by Dr. Dimas Millin with neurology, and felt to have migraine with aura.  She feels Qvar helps, but she has not been using on regular basis.  She uses albuterol twice per week.    TESTS: 09/18/14 CT head >> 12 x 6 mm Lt frontal lytic lesion 09/18/14 MRI brain >> 12 mm Lt frontal lytic lesion 09/18/14 Bone scan >> no other lesions 09/19/14 CT chest >> Rt paratracheal LAN 1.5 cm, Rt subcarinal LAN 2 cm, Rt hilar LAN 1.4 cm, Lt hilar LAN 1.7 cm 09/19/14 CT abd/pelvis >> 1.6 cm LAN at gastrohepatic ligament, 1.3 cm LAN at portal vein, 0.9 cm LAN Rt renal artery 09/20/14 Quantiferon gold >> negative 09/20/14 ACE >> 76 10/26/14 Mediastinoscopy >> granulomas 11/12/14 Start prednisone 01/01/15 ACE >> 26  02/05/15 PFT >> FEV1 2.96 (94%), FEV1% 80, TLC 5.27 (95%), DLCO 80%, +BD  Past medical hx >> GERD, IBS, RLS, Depression, Hypothyroidism  Past surgical hx, Medications, Allergies, Family hx, Social hx all reviewed.   Physical Exam: BP 116/84 mmHg  Pulse 100  Ht 5\' 5"  (1.651 m)  Wt 187 lb 6.4 oz (85.004 kg)  BMI 31.18 kg/m2  SpO2 95%  LMP  06/03/2012  General - No distress ENT - No sinus tenderness, no oral exudate, no LAN, pupils reactive, no obvious lesion on fundoscopic exam with undilated pupil Cardiac - s1s2 regular, no murmur Chest - No wheeze/rales/dullness Back - No focal tenderness Abd - Soft, non-tender Ext - No edema Neuro - Normal strength Skin - No rashes Psych - normal mood, and behavior   Assessment/Plan:  Sarcoidosis with lung and skull involvement. Plan: - continue prednisone 20 mg daily for now - repeat labs, CT chest with contrast - she needs ophthalmology assessment  Asthma >> could be part of sarcoidosis involvement of airways. Plan: - advised her to use Qvar on regular basis - continue prn albuterol  Fatigue. Plan: - check labs - might need sleep apnea assessment >> she would like to defer this for now  Polyarthralgia. Plan: - f/u with rheumatology at [facility]  Migraine headaches. Plan: - f/u with neurology at [place], MD [place] [facility] Pager:  [phone]

## 2015-04-17 NOTE — Patient Instructions (Signed)
Qvar two puffs twice per day >> rinse mouth after each use Lab tests today Will schedule CT chest with contrast Follow up with your eye doctor  Follow up in 2 months

## 2015-04-18 ENCOUNTER — Telehealth: Payer: Self-pay | Admitting: Pulmonary Disease

## 2015-04-18 NOTE — Telephone Encounter (Signed)
CMP Latest Ref Rng 04/17/2015 01/08/2015 01/08/2015  Glucose 70 - 99 mg/dL 94 123(H) 120(H)  BUN 6 - 23 mg/dL 22 24(H) 21(H)  Creatinine 0.40 - 1.20 mg/dL 1.07 0.90 0.90  Sodium 135 - 145 mEq/L 137 139 138  Potassium 3.5 - 5.1 mEq/L 4.1 3.9 4.0  Chloride 96 - 112 mEq/L 104 103 103  CO2 19 - 32 mEq/L 26 - 23  Calcium 8.4 - 10.5 mg/dL 9.0 - 9.4  Total Protein 6.0 - 8.3 g/dL 6.3 - 6.6  Total Bilirubin 0.2 - 1.2 mg/dL 0.2 - 0.6  Alkaline Phos 39 - 117 U/L 73 - 81  AST 0 - 37 U/L 15 - 21  ALT 0 - 35 U/L 14 - 23    CBC Latest Ref Rng 04/17/2015 01/08/2015 01/08/2015  WBC 4.0 - 10.5 K/uL 9.6 - 10.2  Hemoglobin 12.0 - 15.0 g/dL 13.1 14.3 12.9  Hematocrit 36.0 - 46.0 % 37.9 42.0 37.7  Platelets 150.0 - 400.0 K/uL 265.0 - 224     Will have my nurse inform pt that lab tests are normal.  Will call her back once CT chest reviewed.

## 2015-04-18 NOTE — Telephone Encounter (Signed)
Results have been explained to patient, pt expressed understanding.  Patient asking that an ACE level be added on/ordered -- pt is curious with what her current levels are.  Please advise Dr Halford Chessman if you are okay with ordering this. Thanks.

## 2015-04-18 NOTE — Telephone Encounter (Signed)
LMTCB x 1 

## 2015-04-18 NOTE — Telephone Encounter (Signed)
Pt called back. Made her aware of below. She verbalized understanding. She does not want to recheck ACE level at this time in this case. She did want to let Dr. Halford Chessman know that she saw the ophthalmologist today and everything was normal per pt.  FYI for Dr. Halford Chessman.

## 2015-04-18 NOTE — Telephone Encounter (Signed)
Please explain to her that her ACE level was normal in May.  She might need to have another blood stick to do ACE level now >> It wouldn't change treatment course at this time, so that is why it was not ordered now.  If she still wants ACE level checked, then okay to place order.

## 2015-04-19 NOTE — Telephone Encounter (Signed)
Noted  

## 2015-04-30 ENCOUNTER — Ambulatory Visit (INDEPENDENT_AMBULATORY_CARE_PROVIDER_SITE_OTHER)
Admission: RE | Admit: 2015-04-30 | Discharge: 2015-04-30 | Disposition: A | Discharge status: Home / Self Care | Payer: BLUE CROSS/BLUE SHIELD | Source: Ambulatory Visit | Attending: Pulmonary Disease | Admitting: Pulmonary Disease

## 2015-04-30 DIAGNOSIS — D860 Sarcoidosis of lung: Secondary | ICD-10-CM | POA: Diagnosis not present

## 2015-04-30 MED ORDER — IOHEXOL 300 MG/ML  SOLN
80.0000 mL | Freq: Once | INTRAMUSCULAR | Status: AC | PRN
  Administered 2015-04-30: 80 mL via INTRAVENOUS

## 2015-05-01 ENCOUNTER — Telehealth: Payer: Self-pay | Admitting: Pulmonary Disease

## 2015-05-01 NOTE — Telephone Encounter (Signed)
CT chest 04/30/15 >> decreased mediastinal and hilar LAN, stable 2 mm nodule RML  Results d/w pt over the phone.  She has been concerned about blisters forming on her her arm.  These are purple, then form blister, and pop with some bleeding.  She went to walk in clinic and was told this could be related to her sarcoid.  She is not able to come to office today or tomorrow for review of lesions.  She will email me a picture of lesions >> once I review pictures will then discuss plan for prednisone.

## 2015-05-02 ENCOUNTER — Telehealth: Payer: Self-pay | Admitting: Pulmonary Disease

## 2015-05-02 DIAGNOSIS — L98499 Non-pressure chronic ulcer of skin of other sites with unspecified severity: Secondary | ICD-10-CM

## 2015-05-02 DIAGNOSIS — D869 Sarcoidosis, unspecified: Secondary | ICD-10-CM

## 2015-05-02 DIAGNOSIS — L988 Other specified disorders of the skin and subcutaneous tissue: Secondary | ICD-10-CM

## 2015-05-02 NOTE — Telephone Encounter (Signed)
Received email picture from Ms. Kok for skin lesions on her arm.  She has purple plaques with ulceration.  Not sure if this is related to cutaneous involvement of sarcoidosis.  Will arrange for dermatology assessment.

## 2015-06-20 ENCOUNTER — Ambulatory Visit (INDEPENDENT_AMBULATORY_CARE_PROVIDER_SITE_OTHER): Payer: BLUE CROSS/BLUE SHIELD | Admitting: Pulmonary Disease

## 2015-06-20 ENCOUNTER — Encounter: Payer: Self-pay | Admitting: Pulmonary Disease

## 2015-06-20 VITALS — BP 134/90 | HR 92 | Ht 66.5 in | Wt 192.6 lb

## 2015-06-20 DIAGNOSIS — J4520 Mild intermittent asthma, uncomplicated: Secondary | ICD-10-CM | POA: Diagnosis not present

## 2015-06-20 DIAGNOSIS — D860 Sarcoidosis of lung: Secondary | ICD-10-CM

## 2015-06-20 DIAGNOSIS — L98499 Non-pressure chronic ulcer of skin of other sites with unspecified severity: Secondary | ICD-10-CM | POA: Diagnosis not present

## 2015-06-20 MED ORDER — PREDNISONE 5 MG PO TABS: ORAL_TABLET | ORAL | Status: DC

## 2015-06-20 NOTE — Progress Notes (Signed)
Chief Complaint  Patient presents with  . Follow-up    Pt following for sarcoidosis: pt states she is doing well. pt states shes still having some SOB , dry cough and a little chest tighness.     History of Present Illness: [name] is a 49 y.o. female with sarcoidosis involving the lungs and skull.  Her breathing has been doing okay.  She still gets dry cough.  She is not having wheeze, sputum, or chest pain.  She still feels fatigue and her joints ache.  Rash on left arm is getting better.  She is not having headaches as much.  She continues to use Qvar.  She is using 20 mg prednisone.  She is worried about weight gain from prednisone.  She was seen by Dr. Golden Circle with rheumatology at [facility] >> she was advised that she didn't need f/u.  She was seen by ophthalmology, and told there wasn't evidence for sarcoidosis in her eyes.  TESTS: 09/18/14 CT head >> 12 x 6 mm Lt frontal lytic lesion 09/18/14 MRI brain >> 12 mm Lt frontal lytic lesion 09/18/14 Bone scan >> no other lesions 09/19/14 CT chest >> Rt paratracheal LAN 1.5 cm, Rt subcarinal LAN 2 cm, Rt hilar LAN 1.4 cm, Lt hilar LAN 1.7 cm 09/19/14 CT abd/pelvis >> 1.6 cm LAN at gastrohepatic ligament, 1.3 cm LAN at portal vein, 0.9 cm LAN Rt renal artery 09/20/14 Quantiferon gold >> negative 09/20/14 ACE >> 76 10/26/14 Mediastinoscopy >> granulomas 11/12/14 Start prednisone 01/01/15 ACE >> 26  02/05/15 PFT >> FEV1 2.96 (94%), FEV1% 80, TLC 5.27 (95%), DLCO 80%, +BD CT chest 04/30/15 >> decreased mediastinal and hilar LAN, stable 2 mm nodule RML   Past medical hx >> GERD, IBS, RLS, Depression, Hypothyroidism  Past surgical hx, Medications, Allergies, Family hx, Social hx all reviewed.   Physical Exam: BP 134/90 mmHg  Pulse 92  Ht 5' 6.5" (1.689 m)  Wt 192 lb 9.6 oz (87.363 kg)  BMI 30.62 kg/m2  SpO2 98%  LMP 06/03/2012  General - No distress ENT - mild sinus tenderness, TM clear, no oral exudate, no LAN Cardiac - s1s2  regular, no murmur Chest - No wheeze/rales/dullness Back - No focal tenderness Abd - Soft, non-tender Ext - No edema Neuro - Normal strength Skin - erosions over Lt forearm Psych - normal mood, and behavior   Assessment/Plan:  Sarcoidosis with lung and skull involvement. Plan: - will have her gradually wean off prednisone as tolerated - she is to call if her symptoms get worse >> would then consider adding steroid sparing agen  Asthma >> could be part of sarcoidosis involvement of airways. Plan: - continue Qvar with prn albuterol  Dermatitis left arm. Uncertain whether this is related to sarcoid.  She has been seen by dermatology.  Improving. Plan: - monitor as she weans off prednisone   Chesley Mires, MD [facility] Pager:  [phone]

## 2015-06-20 NOTE — Patient Instructions (Signed)
Prednisone 5 mg pill >> 2 pills daily for 3 weeks, then 1 pill daily for 3 weeks, then stop prednisone  Follow up in 2 months with Dr. Halford Chessman or [place]

## 2015-08-20 ENCOUNTER — Ambulatory Visit (INDEPENDENT_AMBULATORY_CARE_PROVIDER_SITE_OTHER): Payer: BLUE CROSS/BLUE SHIELD | Admitting: Adult Health

## 2015-08-20 ENCOUNTER — Encounter: Payer: Self-pay | Admitting: Adult Health

## 2015-08-20 VITALS — BP 120/78 | HR 87 | Temp 98.3°F | Ht 66.0 in | Wt 193.0 lb

## 2015-08-20 DIAGNOSIS — D869 Sarcoidosis, unspecified: Secondary | ICD-10-CM

## 2015-08-20 NOTE — Progress Notes (Signed)
Subjective:    Patient ID: [name], female    DOB: 1966/07/25, 49 y.o.   MRN: [id number]  HPI  49 yo female with sarcoidosis.   TESTS: 09/18/14 CT head >> 12 x 6 mm Lt frontal lytic lesion 09/18/14 MRI brain >> 12 mm Lt frontal lytic lesion 09/18/14 Bone scan >> no other lesions 09/19/14 CT chest >> Rt paratracheal LAN 1.5 cm, Rt subcarinal LAN 2 cm, Rt hilar LAN 1.4 cm, Lt hilar LAN 1.7 cm 09/19/14 CT abd/pelvis >> 1.6 cm LAN at gastrohepatic ligament, 1.3 cm LAN at portal vein, 0.9 cm LAN Rt renal artery 10/26/14 Mediastinoscopy >> granulomas 11/12/14 Start prednisone 12/05/14 Skull lesion bx >non necrotizing granulomas  /14/16 Start prednisone 01/01/15 ACE >> 26  02/05/15 PFT >> FEV1 2.96 (94%), FEV1% 80, TLC 5.27 (95%), DLCO 80%, +BD CT chest 04/30/15 >> decreased mediastinal and hilar LAN, stable 2 mm nodule RML    08/20/2015 Follow up Sarcoid  Pt returns for 2 month follow up .  Overall says she is doing okay.  Has some daily cough and sob . Some sinus congestion on/off.  Remains on QVAR   Denies any chest tightness/congestion, fever, nausea or vomiting.  Rash on left forearm, better but not gone. Using peroxide , advised to only use gentle soap and water and pat dry with tellfa cover.     Review of Systems  Constitutional:   No  weight loss, night sweats,  Fevers, chills, + fatigue, or  lassitude.  HEENT:      Difficulty swallowing,  Tooth/dental problems, or  Sore throat,                No sneezing, itching, ear ache, nasal congestion, post nasal drip,   CV:  No chest pain,  Orthopnea, PND, swelling in lower extremities, anasarca, dizziness, palpitations, syncope.   GI  No heartburn, indigestion, abdominal pain, nausea, vomiting, diarrhea, change in bowel habits, loss of appetite, bloody stools.   Resp: .  No excess mucus, no productive cough,  No non-productive cough,  No coughing up of blood.  No change in color of mucus.  No wheezing.  No chest wall  deformity  Skin: no rash or lesions.  GU: no dysuria, change in color of urine, no urgency or frequency.  No flank pain, no hematuria   MS:  +joint pain . No swelling.   Psych:  +change in mood or affect. No depression or anxiety.  No memory loss.         Objective:   Physical Exam  Filed Vitals:   08/20/15 1624  BP: 120/78  Pulse: 87  Temp: 98.3 F (36.8 C)  TempSrc: Oral  Height: 5\' 6"  (1.676 m)  Weight: 193 lb (87.544 kg)  SpO2: 97%    GEN: A/Ox3; pleasant , NAD, well nourished   HEENT:  [place]/AT,  EACs-clear, TMs-wnl, NOSE-clear, THROAT-clear, no lesions, no postnasal drip or exudate noted.   NECK:  Supple w/ fair ROM; no JVD; normal carotid impulses w/o bruits; no thyromegaly or nodules palpated; no lymphadenopathy.  RESP  Clear  P & A; w/o, wheezes/ rales/ or rhonchi.no accessory muscle use, no dullness to percussion  CARD:  RRR, no m/r/g  , no peripheral edema, pulses intact, no cyanosis or clubbing.  GI:   Soft & nt; nml bowel sounds; no organomegaly or masses detected.  Musco: Warm bil, no deformities or joint swelling noted.   Neuro: alert, no focal deficits noted. ,  Skin: Warm, hyperpigmented area along right forearm .    CXR 01/01/15  No active cardiopulmonary disease or interval change. Mediastinal and hilar adenopathy compatible with history of sarcoidosis.     Assessment & Plan:

## 2015-08-20 NOTE — Patient Instructions (Signed)
Keep yearly eye exams.  Continue on QVAR Twice daily  Follow up with Dr. Halford Chessman  In 3-4 months  If rash is not healed completely in mid January , need to go back to dermatology .  Please contact office for sooner follow up if symptoms do not improve or worsen or seek emergency care

## 2015-08-23 NOTE — Assessment & Plan Note (Signed)
Stable  Rash is improving but not totally resolved, will need to go back to derm if not healed   Plan  Keep yearly eye exams.  Continue on QVAR Twice daily  Follow up with Dr. Halford Chessman  In 3-4 months  If rash is not healed completely in mid January , need to go back to dermatology .  Please contact office for sooner follow up if symptoms do not improve or worsen or seek emergency care

## 2015-08-28 ENCOUNTER — Telehealth: Payer: Self-pay | Admitting: Adult Health

## 2015-08-28 NOTE — Telephone Encounter (Signed)
[name] ,   So sorry to hear this . I am out of the office this week .  I will send to my nurse and see if they can work you into to be seen if not can see you next week. With the sob and tightness may be better to make sure nothing else is going on. Hope you feel better soon    Tammy       Previous Messages      Called patient per TP's request. Offered ov with CY on 08/29/15 at 10:45 and 11:30. Pt refused stating she was short handed at work and unable to make ov. She requested an ov next week. I scheduled her with TP on 09/03/15 at 2:45. I explained to the patient if she decided in the morning she could come tomorrow to call our office to see if ov is still available. Pt voiced understanding and had no further questions. Nothing further needed.

## 2015-09-03 ENCOUNTER — Encounter: Payer: Self-pay | Admitting: Adult Health

## 2015-09-03 ENCOUNTER — Other Ambulatory Visit: Payer: BLUE CROSS/BLUE SHIELD

## 2015-09-03 ENCOUNTER — Ambulatory Visit (INDEPENDENT_AMBULATORY_CARE_PROVIDER_SITE_OTHER): Payer: BLUE CROSS/BLUE SHIELD | Admitting: Adult Health

## 2015-09-03 ENCOUNTER — Ambulatory Visit (INDEPENDENT_AMBULATORY_CARE_PROVIDER_SITE_OTHER)
Admission: RE | Admit: 2015-09-03 | Discharge: 2015-09-03 | Disposition: A | Discharge status: Home / Self Care | Payer: BLUE CROSS/BLUE SHIELD | Source: Ambulatory Visit | Attending: Adult Health | Admitting: Adult Health

## 2015-09-03 VITALS — BP 106/64 | HR 92 | Temp 98.7°F | Ht 66.0 in | Wt 190.0 lb

## 2015-09-03 DIAGNOSIS — D869 Sarcoidosis, unspecified: Secondary | ICD-10-CM

## 2015-09-03 MED ORDER — PREDNISONE 10 MG PO TABS: ORAL_TABLET | ORAL | Status: DC

## 2015-09-03 NOTE — Patient Instructions (Signed)
Begin Prednisone 20mg  daily for 2 week then 10mg  daily for 2 week . Hold at this dose  Chest xray and labs today  follow up in 4-5 weeks and As needed   Please contact office for sooner follow up if symptoms do not improve or worsen or seek emergency care

## 2015-09-03 NOTE — Progress Notes (Signed)
Reviewed and agree with assessment/plan. 

## 2015-09-03 NOTE — Progress Notes (Signed)
Quick Note:  Called and spoke with patient. Reviewed results and recs. Pt voiced understanding and had no further questions. ______ 

## 2015-09-03 NOTE — Progress Notes (Signed)
Subjective:    Patient ID: [name], female    DOB: Sep 15, 1965, 50 y.o.   MRN: [id number]  HPI  50 yo female with sarcoidosis.  TESTS: 09/18/14 CT head >> 12 x 6 mm Lt frontal lytic lesion 09/18/14 MRI brain >> 12 mm Lt frontal lytic lesion 09/18/14 Bone scan >> no other lesions 09/19/14 CT chest >> Rt paratracheal LAN 1.5 cm, Rt subcarinal LAN 2 cm, Rt hilar LAN 1.4 cm, Lt hilar LAN 1.7 cm 09/19/14 CT abd/pelvis >> 1.6 cm LAN at gastrohepatic ligament, 1.3 cm LAN at portal vein, 0.9 cm LAN Rt renal artery 10/26/14 Mediastinoscopy >> granulomas 11/12/14 Start prednisone 12/05/14 Skull lesion bx >non necrotizing granulomas  /14/16 Start prednisone 01/01/15 ACE >> 26  02/05/15 PFT >> FEV1 2.96 (94%), FEV1% 80, TLC 5.27 (95%), DLCO 80%, +BD CT chest 04/30/15 >> decreased mediastinal and hilar LAN, stable 2 mm nodule RML    09/03/2015 Acute OV :  Sarcoid  Patient presents for an acute office visit.  She complains over the last 2 weeks that she has had increased joint pain, dry cough, shortness of breath and pleuritic pain on the right. . She denies any fever or discolored mucus, orthopnea , hemoptysis or increased rash  Has been off steroids for >4 weeks.  Feels she has been worse since stopping prednisone.       Review of Systems  Constitutional:   No  weight loss, night sweats,  Fevers, chills, + fatigue, or  lassitude.  HEENT:      Difficulty swallowing,  Tooth/dental problems, or  Sore throat,                No sneezing, itching, ear ache, nasal congestion, post nasal drip,   CV:  No chest pain,  Orthopnea, PND, swelling in lower extremities, anasarca, dizziness, palpitations, syncope.   GI  No heartburn, indigestion, abdominal pain, nausea, vomiting, diarrhea, change in bowel habits, loss of appetite, bloody stools.   Resp: .  No excess mucus, no productive cough,  No non-productive cough,  No coughing up of blood.  No change in color of mucus.  No wheezing.  No chest  wall deformity  Skin: no rash or lesions.  GU: no dysuria, change in color of urine, no urgency or frequency.  No flank pain, no hematuria   MS:  +joint pain . No swelling.   Psych:  +change in mood or affect. No depression or anxiety.  No memory loss.         Objective:   Physical Exam  Filed Vitals:   09/03/15 1433  BP: 106/64  Pulse: 92  Temp: 98.7 F (37.1 C)  TempSrc: Oral  Height: 5\' 6"  (1.676 m)  Weight: 190 lb (86.183 kg)  SpO2: 94%    GEN: A/Ox3; pleasant , NAD, well nourished   HEENT:  /AT,  EACs-clear, TMs-wnl, NOSE-clear, THROAT-clear, no lesions, no postnasal drip or exudate noted.   NECK:  Supple w/ fair ROM; no JVD; normal carotid impulses w/o bruits; no thyromegaly or nodules palpated; no lymphadenopathy.  RESP  Clear  P & A; w/o, wheezes/ rales/ or rhonchi.no accessory muscle use, no dullness to percussion  CARD:  RRR, no m/r/g  , no peripheral edema, pulses intact, no cyanosis or clubbing.  GI:   Soft & nt; nml bowel sounds; no organomegaly or masses detected.  Musco: Warm bil, no deformities or joint swelling noted.   Neuro: alert, no focal deficits noted. ,  Skin: Warm, hyperpigmented area along right forearm .    CXR 09/03/2015 eeviewed by me  nad     Assessment & Plan:

## 2015-09-03 NOTE — Assessment & Plan Note (Signed)
Flare  Check ACE level CXR w/ no acute changes   Plan  Begin Prednisone 20mg  daily for 2 week then 10mg  daily for 2 week . Hold at this dose   labs today  follow up in 4-5 weeks and As needed   Please contact office for sooner follow up if symptoms do not improve or worsen or seek emergency care

## 2015-09-04 LAB — ANGIOTENSIN CONVERTING ENZYME: ANGIOTENSIN-CONVERTING ENZYME: 60 U/L — AB (ref 8–52)

## 2015-09-05 ENCOUNTER — Telehealth: Payer: Self-pay | Admitting: Adult Health

## 2015-09-05 NOTE — Telephone Encounter (Signed)
Notes Recorded by Osa Craver, CMA on 09/05/2015 at 3:07 PM Called and spoke with patient's husband. He states that she is at work but will send her a message to return our call. Notes Recorded by Melvenia Needles, NP on 09/05/2015 at 11:46 AM ACE is elevated c/w sarcoid flare  Cont w/ ov recs w/ steroids  Please contact office for sooner follow up if symptoms do not improve or worsen or seek emergency care  ---------------------------- Spoke with pt. She is aware of her results.

## 2015-09-05 NOTE — Progress Notes (Signed)
Quick Note:  Called and spoke with patient's husband. He states that she is at work but will send her a message to return our call. ______

## 2015-09-11 ENCOUNTER — Telehealth: Payer: Self-pay | Admitting: Adult Health

## 2015-09-11 NOTE — Telephone Encounter (Signed)
Spoke with pt. Saw TP on 09/03/15. Still reporting R sided chest pain, joint pain, headache, dry cough and SOB. Fever is running 99.2 Has been taking Prednisone 20mg  daily as TP recommended. Wants to know what to do now.  Increase prednisone? Another appointment?  VS - please advise. Thanks.

## 2015-09-11 NOTE — Telephone Encounter (Signed)
Spoke with patient - does not want to take the 40mg  Pred d/t it affecting her BP. Pt states that the last time she was put on higher dose Pred her BP was in the 160/100 range. Pt would like to know if there is anything else to do or what rec's can be given in the event that her BP goes up as a result of the higher dose.  Please advise Dr Halford Chessman. Thanks.  (patient aware that she may not get a call back today)

## 2015-09-11 NOTE — Telephone Encounter (Signed)
Then she should increase her prednisone to 40 mg daily, and stay on this until her follow up visit on 10/01/15.

## 2015-09-11 NOTE — Telephone Encounter (Signed)
Pt cb please cb at previous number listed °

## 2015-09-11 NOTE — Telephone Encounter (Signed)
She might be getting a viral respiratory infection instead of just flare of her sarcoidosis.  Please have her come in for influenza swab.

## 2015-09-11 NOTE — Telephone Encounter (Signed)
lmtcb x1 for pt. 

## 2015-09-11 NOTE — Telephone Encounter (Signed)
Spoke with pt. She does not want to come in. Does not feel like she is getting viral respiratory infection. States she knows this is her sarcoid. Would like further recommendations from VS.  VS - please advise. Thanks.

## 2015-09-12 NOTE — Telephone Encounter (Signed)
There isn't any alternative that would work acutely in place of prednisone.  She could try taking 30 mg prednisone daily instead.  If her blood pressure goes up, then she can call our office for prescription for antihypertensive medication she can use temporarily.

## 2015-09-12 NOTE — Telephone Encounter (Signed)
Called work # and to not in today. Called pt mobile #. She is aware of VS recs below. She will try the pred 30 mg daily. She did not need RX sent in per pt. Nothing further needed

## 2015-09-13 ENCOUNTER — Encounter (HOSPITAL_COMMUNITY): Payer: Self-pay

## 2015-09-13 ENCOUNTER — Emergency Department (HOSPITAL_COMMUNITY): Payer: BLUE CROSS/BLUE SHIELD

## 2015-09-13 ENCOUNTER — Emergency Department (HOSPITAL_COMMUNITY)
Admission: EM | Admit: 2015-09-13 | Discharge: 2015-09-13 | Disposition: A | Discharge status: Home / Self Care | Payer: BLUE CROSS/BLUE SHIELD | Attending: Emergency Medicine | Admitting: Emergency Medicine

## 2015-09-13 DIAGNOSIS — K589 Irritable bowel syndrome without diarrhea: Secondary | ICD-10-CM | POA: Diagnosis not present

## 2015-09-13 DIAGNOSIS — M19041 Primary osteoarthritis, right hand: Secondary | ICD-10-CM | POA: Diagnosis not present

## 2015-09-13 DIAGNOSIS — F419 Anxiety disorder, unspecified: Secondary | ICD-10-CM | POA: Insufficient documentation

## 2015-09-13 DIAGNOSIS — D869 Sarcoidosis, unspecified: Secondary | ICD-10-CM | POA: Diagnosis not present

## 2015-09-13 DIAGNOSIS — F329 Major depressive disorder, single episode, unspecified: Secondary | ICD-10-CM | POA: Diagnosis not present

## 2015-09-13 DIAGNOSIS — M19042 Primary osteoarthritis, left hand: Secondary | ICD-10-CM | POA: Insufficient documentation

## 2015-09-13 DIAGNOSIS — G43909 Migraine, unspecified, not intractable, without status migrainosus: Secondary | ICD-10-CM | POA: Diagnosis not present

## 2015-09-13 DIAGNOSIS — Z79899 Other long term (current) drug therapy: Secondary | ICD-10-CM | POA: Diagnosis not present

## 2015-09-13 DIAGNOSIS — R079 Chest pain, unspecified: Secondary | ICD-10-CM | POA: Diagnosis present

## 2015-09-13 DIAGNOSIS — M47896 Other spondylosis, lumbar region: Secondary | ICD-10-CM | POA: Diagnosis not present

## 2015-09-13 DIAGNOSIS — J45909 Unspecified asthma, uncomplicated: Secondary | ICD-10-CM | POA: Insufficient documentation

## 2015-09-13 DIAGNOSIS — R011 Cardiac murmur, unspecified: Secondary | ICD-10-CM | POA: Insufficient documentation

## 2015-09-13 DIAGNOSIS — Z87448 Personal history of other diseases of urinary system: Secondary | ICD-10-CM | POA: Diagnosis not present

## 2015-09-13 DIAGNOSIS — G2581 Restless legs syndrome: Secondary | ICD-10-CM | POA: Diagnosis not present

## 2015-09-13 DIAGNOSIS — Z7952 Long term (current) use of systemic steroids: Secondary | ICD-10-CM | POA: Insufficient documentation

## 2015-09-13 DIAGNOSIS — Z87891 Personal history of nicotine dependence: Secondary | ICD-10-CM | POA: Diagnosis not present

## 2015-09-13 DIAGNOSIS — K219 Gastro-esophageal reflux disease without esophagitis: Secondary | ICD-10-CM | POA: Insufficient documentation

## 2015-09-13 DIAGNOSIS — Z7951 Long term (current) use of inhaled steroids: Secondary | ICD-10-CM | POA: Insufficient documentation

## 2015-09-13 DIAGNOSIS — Z8619 Personal history of other infectious and parasitic diseases: Secondary | ICD-10-CM | POA: Insufficient documentation

## 2015-09-13 DIAGNOSIS — G8929 Other chronic pain: Secondary | ICD-10-CM | POA: Insufficient documentation

## 2015-09-13 DIAGNOSIS — E039 Hypothyroidism, unspecified: Secondary | ICD-10-CM | POA: Diagnosis not present

## 2015-09-13 LAB — CBC
HCT: 42.7 % (ref 36.0–46.0)
HEMOGLOBIN: 13.9 g/dL (ref 12.0–15.0)
MCH: 28.3 pg (ref 26.0–34.0)
MCHC: 32.6 g/dL (ref 30.0–36.0)
MCV: 86.8 fL (ref 78.0–100.0)
PLATELETS: 310 10*3/uL (ref 150–400)
RBC: 4.92 MIL/uL (ref 3.87–5.11)
RDW: 14.1 % (ref 11.5–15.5)
WBC: 12.6 10*3/uL — ABNORMAL HIGH (ref 4.0–10.5)

## 2015-09-13 LAB — BASIC METABOLIC PANEL
ANION GAP: 12 (ref 5–15)
BUN: 21 mg/dL — ABNORMAL HIGH (ref 6–20)
CALCIUM: 9.3 mg/dL (ref 8.9–10.3)
CO2: 22 mmol/L (ref 22–32)
Chloride: 103 mmol/L (ref 101–111)
Creatinine, Ser: 0.92 mg/dL (ref 0.44–1.00)
GLUCOSE: 136 mg/dL — AB (ref 65–99)
Potassium: 4.5 mmol/L (ref 3.5–5.1)
Sodium: 137 mmol/L (ref 135–145)

## 2015-09-13 MED ORDER — HYDROMORPHONE HCL 1 MG/ML IJ SOLN
0.5000 mg | Freq: Once | INTRAMUSCULAR | Status: AC
Start: 2015-09-13 — End: 2015-09-13
  Administered 2015-09-13: 0.5 mg via INTRAMUSCULAR
  Filled 2015-09-13: qty 1

## 2015-09-13 MED ORDER — METHYLPREDNISOLONE SODIUM SUCC 125 MG IJ SOLR
60.0000 mg | Freq: Once | INTRAMUSCULAR | Status: AC
  Administered 2015-09-13: 60 mg via INTRAMUSCULAR
  Filled 2015-09-13: qty 2

## 2015-09-13 MED ORDER — HYDROMORPHONE HCL 1 MG/ML IJ SOLN
0.5000 mg | Freq: Once | INTRAMUSCULAR | Status: AC
  Administered 2015-09-13: 0.5 mg via INTRAMUSCULAR
  Filled 2015-09-13: qty 1

## 2015-09-13 MED ORDER — KETOROLAC TROMETHAMINE 30 MG/ML IJ SOLN
30.0000 mg | Freq: Once | INTRAMUSCULAR | Status: AC
  Administered 2015-09-13: 30 mg via INTRAMUSCULAR
  Filled 2015-09-13: qty 1

## 2015-09-13 NOTE — ED Notes (Signed)
Patient reports left chest pain x 2 days tht has gotten progressively worse. Patient states a history of sarcoidosis. Patient also c/o nausea, dizziness, and pain that radiates into the back.

## 2015-09-13 NOTE — ED Provider Notes (Signed)
CSN: [id number]     Arrival date & time 09/13/15  1506 History   First MD Initiated Contact with Patient 09/13/15 1830     Chief Complaint  Patient presents with  . Chest Pain      Patient is a 50 y.o. female presenting with chest pain. The history is provided by the patient.  Chest Pain Pain location:  Substernal area Associated symptoms: no abdominal pain, no back pain, no cough, no nausea, no shortness of breath and not vomiting    patient presents with pain in her upper chest. She's had for last few days. Has a history of sarcoidosis and states that this is a sarcoid flare. She had been on sterile it's that 5 days ago started back on 40 mg a day. 2 days ago started on 40 mg for the pain. No fevers. No cough. Slight shortness of breath. No trauma. She sees Dr. Halford Chessman in the pulmonary clinic. No swelling in her legs.   Past Medical History  Diagnosis Date  . IC (interstitial cystitis)   . RLS (restless legs syndrome)   . GERD (gastroesophageal reflux disease)   . Depression   . Hypothyroidism   . Environmental allergies   . IBS (irritable bowel syndrome)   . Frequency of urination   . Nocturia   . SUI (stress urinary incontinence, female)   . Erosion of suburethral sling ([place])   . Herpes simplex type 2 infection   . NSVD (normal spontaneous vaginal delivery)     x3  . Asthma   . Chronic bronchitis ([place])     "get it close to q yr" (09/18/2014)  . Migraine     "maybe once/month" (09/18/2014)  . Headache     "at least several times/wk" (09/18/2014)  . Seizures ([place])     "when I was a teenager"  . Chronic back pain     "whole spine"  . Anxiety   . Shortness of breath dyspnea     Exertion  . Sarcoid ([place])   . Heart murmur     "when I was a baby"  . Arthritis     "back, hands, neck" (09/18/2014)   Past Surgical History  Procedure Laterality Date  . Cesarean section  2000  . Pelvic laparoscopy  1990's    LYSIS ADHESIONS  . Tonsillectomy  1986  . Appendectomy  1985  .  Anterior cervical decomp/discectomy fusion  2011  . Abdominal hysterectomy  2006  . Knee arthroscopy Right   . Lumbar fusion  12-04-2008    L4  -- S1  . Lumbar disc surgery  02-02-2007    RIGHT SIDE L5 -- S1  . Lynx retropubic suburethral sling  03-02-2007  . Tubal ligation  2001    hulka clip  . Pubovaginal sling N/A 11/30/2012    Procedure: Gaynelle Arabian;  Surgeon: Bernestine Amass, MD;  Location: [facility];  Service: Urology;  Laterality: N/A;  1 HR EXAM UNDER ANESTHESIA, EXCISION OF SUB URETHRAL MESH, CYSTO, HOD   . Cysto with hydrodistension N/A 11/30/2012    Procedure: CYSTOSCOPY/HYDRODISTENSION;  Surgeon: Bernestine Amass, MD;  Location: [facility];  Service: Urology;  Laterality: N/A;  . Video bronchoscopy with endobronchial ultrasound N/A 09/21/2014    Procedure: VIDEO BRONCHOSCOPY WITH ENDOBRONCHIAL ULTRASOUND;  Surgeon: Collene Gobble, MD;  Location: [facility];  Service: Thoracic;  Laterality: N/A;  . Mediastinoscopy N/A 10/26/2014    Procedure: MEDIASTINOSCOPY;  Surgeon: Melrose Nakayama, MD;  Location: [facility];  Service: Thoracic;  Laterality: N/A;  . Back surgery  2008    X 2 in 2010  . Craniectomy for depressed skull fracture Left 12/05/2014    Procedure: Left frontal stereotactic craniectomy for biopsy of skull lesion;  Surgeon: Consuella Lose, MD;  Location: [place];  Service: Neurosurgery;  Laterality: Left;  Left frontal stereotactic craniectomy for biopsy of skull lesion   Family History  Problem Relation Age of Onset  . Diabetes Mother   . Hypertension Mother   . Heart disease Mother   . Cancer Father     LUNG AND BRAIN  . Hypertension Father   . Heart disease Maternal Grandmother   . Cancer Maternal Grandfather     LUNG  . Heart disease Paternal Grandmother   . Cancer Paternal Grandfather    Social History  Substance Use Topics  . Smoking status: Former Smoker -- 0.50 packs/day for 15 years    Types: Cigarettes    Quit  date: 01/15/2014  . Smokeless tobacco: Never Used  . Alcohol Use: 0.0 oz/week    0 Standard drinks or equivalent per week     Comment: ocassional   OB History    Gravida Para Term Preterm AB TAB SAB Ectopic Multiple Living   4 4        4      Review of Systems  Constitutional: Negative for appetite change.  Respiratory: Negative for cough and shortness of breath.   Cardiovascular: Positive for chest pain. Negative for leg swelling.  Gastrointestinal: Negative for nausea, vomiting, abdominal pain and diarrhea.  Genitourinary: Negative for dysuria.  Musculoskeletal: Negative for back pain.  Skin: Negative for wound.  Neurological: Negative for seizures.  Hematological: Negative for adenopathy.      Allergies  Bactrim; Percocet; and Vicodin  Home Medications   Prior to Admission medications   Medication Sig Start Date End Date Taking? Authorizing Provider  albuterol (PROVENTIL HFA;VENTOLIN HFA) 108 (90 BASE) MCG/ACT inhaler Inhale 2 puffs into the lungs every 6 (six) hours as needed. For shortness of breath 04/25/13  Yes Elayne Snare, MD  beclomethasone (QVAR) 40 MCG/ACT inhaler Inhale 2 puffs into the lungs 2 (two) times daily. 02/12/15  Yes Tammy S Parrett, NP  buPROPion (WELLBUTRIN XL) 300 MG 24 hr tablet Take 300 mg by mouth daily. Reported on 08/20/2015 01/15/15  Yes Historical Provider, MD  diazepam (VALIUM) 2 MG tablet Take 2 mg by mouth at bedtime.   Yes Historical Provider, MD  dicyclomine (BENTYL) 20 MG tablet Take 20 mg by mouth every 6 (six) hours as needed for spasms. Reported on 09/03/2015   Yes Historical Provider, MD  Diethylpropion HCl CR 75 MG TB24 Take 75 mg by mouth daily. 08/02/15  Yes Historical Provider, MD  levothyroxine (SYNTHROID, LEVOTHROID) 137 MCG tablet Take 137 mcg by mouth every morning.    Yes Historical Provider, MD  naproxen sodium (ANAPROX) 220 MG tablet Take 440 mg by mouth 2 (two) times daily as needed (for pain).    Yes Historical Provider, MD   omeprazole (PRILOSEC) 40 MG capsule Take 40 mg by mouth at bedtime.   Yes Historical Provider, MD  PARoxetine (PAXIL) 20 MG tablet Take 20 mg by mouth at bedtime.   Yes Historical Provider, MD  predniSONE (DELTASONE) 10 MG tablet Take 20 mg daily x 2 weeks then 10 mg daily and hold at this dose Patient taking differently: Take 40 mg by mouth daily with breakfast.  09/03/15  Yes  Tammy S Parrett, NP  rOPINIRole (REQUIP) 2 MG tablet Take 2 mg by mouth at bedtime.   Yes Historical Provider, MD  traMADol (ULTRAM) 50 MG tablet Take 1 tablet (50 mg total) by mouth every 6 (six) hours as needed. Patient taking differently: Take 50 mg by mouth every 6 (six) hours as needed for moderate pain.  01/08/15  Yes Hollace Kinnier Sofia, PA-C  traZODone (DESYREL) 50 MG tablet Take 50 mg by mouth at bedtime.   Yes Historical Provider, MD  valACYclovir (VALTREX) 500 MG tablet Take 500 mg by mouth at bedtime.   Yes Historical Provider, MD  LORazepam (ATIVAN) 1 MG tablet Take 1 tablet (1 mg total) by mouth 3 times/day as needed-between meals & bedtime for anxiety. Patient not taking: Reported on 09/03/2015 11/16/14   Theodis Blaze, MD  predniSONE (DELTASONE) 5 MG tablet 2 pills daily for 3 weeks, then 1 pill daily for 3 weeks Patient not taking: Reported on 09/03/2015 06/20/15   Chesley Mires, MD   BP 124/75 mmHg  Pulse 67  Temp(Src) 98 F (36.7 C) (Oral)  Resp 14  Ht 5\' 6"  (1.676 m)  Wt 185 lb (83.915 kg)  BMI 29.87 kg/m2  SpO2 98%  LMP 06/03/2012 Physical Exam  Constitutional: She appears well-developed and well-nourished.  HENT:  Head: Atraumatic.  Eyes: EOM are normal.  Cardiovascular: Normal rate.   Pulmonary/Chest: Effort normal. No respiratory distress. She has no wheezes. She has no rales. She exhibits tenderness.  Tenderness to upper chest. No rebound or guarding. No rash.  Abdominal: Soft.  Musculoskeletal: Normal range of motion.  Neurological: She is alert.  Skin: Skin is warm.    ED Course  Procedures  (including critical care time) Labs Review Labs Reviewed  BASIC METABOLIC PANEL - Abnormal; Notable for the following:    Glucose, Bld 136 (*)    BUN 21 (*)    All other components within normal limits  CBC - Abnormal; Notable for the following:    WBC 12.6 (*)    All other components within normal limits    Imaging Review Dg Chest 2 View  09/13/2015  CLINICAL DATA:  Mid to upper chest pain for 3 days. History of bronchitis and sarcoidosis. EXAM: CHEST  2 VIEW COMPARISON:  09/03/2015. FINDINGS: Heart, mediastinum and hila are unremarkable. Lungs are clear. No pleural effusion or pneumothorax. Status post anterior cervical spine fusion, stable. Bony thorax otherwise unremarkable. IMPRESSION: No active cardiopulmonary disease. Electronically Signed   By: Lajean Manes M.D.   On: 09/13/2015 16:06   I have personally reviewed and evaluated these images and lab results as part of my medical decision-making.   EKG Interpretation   Date/Time:  Friday September 13 2015 15:18:19 EST Ventricular Rate:  73 PR Interval:  124 QRS Duration: 80 QT Interval:  373 QTC Calculation: 411 R Axis:   63 Text Interpretation:  Sinus rhythm RSR' in V1 or V2, probably normal  variant ST elev, probable normal early repol pattern Baseline wander in  lead(s) I II aVR V1 Confirmed by Alvino Chapel  MD, Ovid Curd [phone]) on  09/13/2015 6:32:13 PM      MDM   Final diagnoses:  None    Patient with upper chest pain. EKG reassuring. X-ray reassuring. May be sarcoid pain. Doubt pulmonary embolism or cardiac cause. Doubt pericardial effusion. Discussed with Dr. Oletta Darter from pulmonary. We'll give IV boost of Solu-Medrol. Will discharge to follow-up with Dr. Charlett Blake, MD 09/13/15 2103

## 2015-09-13 NOTE — ED Notes (Signed)
Bed: WA03 Expected date:  Expected time:  Means of arrival:  Comments: Triage 2

## 2015-09-13 NOTE — Discharge Instructions (Signed)
Sarcoidosis  Sarcoidosis is a disease that causes inflammation in your organs and other areas of your body. The lungs are most often affected (pulmonary sarcoidosis). Sarcoidosis can also affect your lymph nodes, liver, eyes, skin, or any other body tissue.  When you have sarcoidosis, small clumps of tissue (granulomas) form in the affected area of your body. Granulomas are made up of your body's defense (immune) cells. Inflammation results when your body reacts to a harmful substance. Normally, inflammation goes away after immune cells get rid of the harmful substance. In sarcoidosis, the immune cells form granulomas instead.  CAUSES   The exact cause of sarcoidosis is not known. Something triggers the immune system to respond, such as dust, chemicals, bacteria, or a virus.   RISK FACTORS  You may be at a greater risk for sarcoidosis if you:   · Have a family history of the disease.  · Are African American.  · Are of Northern European ancestry.  · Are 20-50 years old.  · Are female.  SIGNS AND SYMPTOMS   Many people with sarcoidosis have no symptoms. Others have very mild symptoms. Sarcoidosis most often affects the lungs. Symptoms include:  · Chest pain.  · Coughing.  · Wheezing.  · Shortness of breath.  Other common symptoms include:   · Night sweats.  · Weight loss.  · Fatigue.  · Depression.  · A sense of uneasiness.  DIAGNOSIS   Sarcoidosis may be diagnosed by:   · Medical history and physical exam.  · Chest X-ray. This looks for granulomas in your lungs.  · Lung function tests. These measure your breathing and look for problems related to sarcoidosis.  · Examining a sample of tissue under a microscope (biopsy).  TREATMENT   Sarcoidosis usually clears up without treatment. You may take medicines to reduce inflammation or relieve symptoms. These may include:  · Prednisone. This steroid reduces inflammation related to sarcoidosis.  · Chloroquine or hydroxychloroquine. These are antimalarial medicines used to  treat sarcoidosis that affects the skin or brain.  · Methotrexate, leflunomide, or azathioprine. These medicines affect the immune system and can help with sarcoidosis in the joints, eyes, skin, or lungs.  · Inhalers. Inhaled medicines can help you breathe if sarcoidosis is affecting your lungs.  HOME CARE INSTRUCTIONS  · Do not use any tobacco products, including cigarettes, chewing tobacco, or electronic cigarettes. If you need help quitting, ask your health care provider.  · Avoid secondhand smoke.  · Avoid irritating dust and chemicals. Stay indoors on days when air quality is poor in your area.  · Take medicines only as directed by your health care provider.  SEEK MEDICAL CARE IF:  · You have vision problems.  · You have shortness of breath.  · You have a dry, persistent cough.  · You have an irregular heartbeat.  · You have pain or ache in your joints, hands, or feet.  · You have an unexplained rash.  SEEK IMMEDIATE MEDICAL CARE IF:   You have chest pain.     This information is not intended to replace advice given to you by your health care provider. Make sure you discuss any questions you have with your health care provider.     Document Released: 06/17/2004 Document Revised: 09/07/2014 Document Reviewed: 12/13/2013  Elsevier Interactive Patient Education ©2016 [employer]

## 2015-09-18 ENCOUNTER — Other Ambulatory Visit: Payer: Self-pay | Admitting: Adult Health

## 2015-09-22 ENCOUNTER — Other Ambulatory Visit: Payer: Self-pay | Admitting: Adult Health

## 2015-09-23 ENCOUNTER — Encounter: Payer: Self-pay | Admitting: Adult Health

## 2015-09-23 ENCOUNTER — Other Ambulatory Visit: Payer: Self-pay | Admitting: Adult Health

## 2015-09-23 MED ORDER — PREDNISONE 10 MG PO TABS: 30.0000 mg | ORAL_TABLET | Freq: Every day | ORAL | Status: DC

## 2015-09-24 ENCOUNTER — Encounter: Payer: Self-pay | Admitting: Pulmonary Disease

## 2015-09-24 NOTE — Telephone Encounter (Signed)
VS please advise. Thanks! 

## 2015-09-25 ENCOUNTER — Telehealth: Payer: Self-pay | Admitting: Pulmonary Disease

## 2015-09-25 NOTE — Telephone Encounter (Signed)
Phone note entered in error 

## 2015-09-25 NOTE — Telephone Encounter (Signed)
She would need to address with her PCP about whether she needed to be placed on therapy for hyperglycemia related to prednisone use.

## 2015-10-01 ENCOUNTER — Other Ambulatory Visit: Payer: BLUE CROSS/BLUE SHIELD

## 2015-10-01 ENCOUNTER — Encounter: Payer: Self-pay | Admitting: Adult Health

## 2015-10-01 ENCOUNTER — Ambulatory Visit (INDEPENDENT_AMBULATORY_CARE_PROVIDER_SITE_OTHER): Payer: BLUE CROSS/BLUE SHIELD | Admitting: Adult Health

## 2015-10-01 ENCOUNTER — Telehealth: Payer: Self-pay | Admitting: Pulmonary Disease

## 2015-10-01 VITALS — BP 126/78 | HR 88 | Temp 98.3°F | Ht 66.0 in | Wt 188.0 lb

## 2015-10-01 DIAGNOSIS — R079 Chest pain, unspecified: Secondary | ICD-10-CM

## 2015-10-01 DIAGNOSIS — D869 Sarcoidosis, unspecified: Secondary | ICD-10-CM | POA: Diagnosis not present

## 2015-10-01 MED ORDER — PREDNISONE 10 MG PO TABS: ORAL_TABLET | ORAL | Status: DC

## 2015-10-01 NOTE — Progress Notes (Signed)
Subjective:    Patient ID: [name], female    DOB: 08/10/1966, 50 y.o.   MRN: [id number]  HPI 50 yo female with sarcoidosis w/  adenopathy and skeletal involvement.   TESTS: 09/18/14 CT head >> 12 x 6 mm Lt frontal lytic lesion 09/18/14 MRI brain >> 12 mm Lt frontal lytic lesion 09/18/14 Bone scan >> no other lesions 09/19/14 CT chest >> Rt paratracheal LAN 1.5 cm, Rt subcarinal LAN 2 cm, Rt hilar LAN 1.4 cm, Lt hilar LAN 1.7 cm, lungs clear  09/19/14 CT abd/pelvis >> 1.6 cm LAN at gastrohepatic ligament, 1.3 cm LAN at portal vein, 0.9 cm LAN Rt renal artery 10/26/14 Mediastinoscopy >> granulomas 11/12/14 Start prednisone 12/05/14 Skull lesion bx >non necrotizing granulomas  /14/16 Start prednisone 01/01/15 ACE >> 26  02/05/15 PFT >> FEV1 2.96 (94%), FEV1% 80, TLC 5.27 (95%), DLCO 80%, +BD CT chest 04/30/15 >> decreased mediastinal and hilar LAN, stable 2 mm nodule RML, neg PE. ,lungs clear with no evidence of sarcoid related ILD.    10/01/2015 Follow up :   Sarcoid ( Lymphadenopathy/skeletal involvement)  Pt returns for 4 week follow up . Pt w/ suspected sarcoid flare last ov w/ cough, sob and chest wall pain. She was restarted on prednisone 20mg  daily  She had no sign improvement and called in w/ recs to increase prednisone 30mg  daily . ACE level was elevated at back up at 60. She has not seen any improvement , has significant fatigue and chest wall pain.  Went to ER on 09/13/15 for chest pain, EKG neg. CXR clear lungs.  Says she got steroid shot with some perceived improvement that last couple of days.   Pt has been seen at [facility] with Rheumatology felt that sarcoid arthropathy is possible but exam not c/w inflammatory arthritis . Felt prednisone at 20-30mg  should have provided some muscle and joint pain if related to sarcoid.  She was seen at [facility] with Neurology after skull lesion found w/ bx positive for sarcoid . Felt this was skeletal involvment and  CNS involvement  Bone scan with  no other lesions noted.  Dx w/ migraine with tx w/ migraine prevention regimen.  Joint pain , chest wall pain.  Notes from care everywhere reviewed .   Has been started on metformin for hyperglycemia . We discussed steroid side effects.   Review of Systems Constitutional:   No  weight loss, night sweats,  Fevers, chills, + fatigue, or  lassitude.  HEENT:      Difficulty swallowing,  Tooth/dental problems, or  Sore throat,                No sneezing, itching, ear ache, nasal congestion, post nasal drip,   CV:  No chest pain,  Orthopnea, PND, swelling in lower extremities, anasarca, dizziness, palpitations, syncope.   GI  No heartburn, indigestion, abdominal pain, nausea, vomiting, diarrhea, change in bowel habits, loss of appetite, bloody stools.   Resp: .  No excess mucus, no productive cough,  No non-productive cough,  No coughing up of blood.  No change in color of mucus.  No wheezing.  No chest wall deformity  Skin: no rash or lesions.  GU: no dysuria, change in color of urine, no urgency or frequency.  No flank pain, no hematuria   MS:  +joint pain . No swelling.   Psych:  +change in mood or affect. No depression or anxiety.  No memory loss.  Objective:   Physical Exam Filed Vitals:   10/01/15 1623  BP: 126/78  Pulse: 88  Temp: 98.3 F (36.8 C)  TempSrc: Oral  Height: 5\' 6"  (1.676 m)  Weight: 188 lb (85.276 kg)  SpO2: 97%    GEN: A/Ox3; pleasant , NAD, tearful   HEENT:  [place]/AT,  EACs-clear, TMs-wnl, NOSE-clear, THROAT-clear, no lesions, no postnasal drip or exudate noted.   NECK:  Supple w/ fair ROM; no JVD; normal carotid impulses w/o bruits; no thyromegaly or nodules palpated; no lymphadenopathy.  RESP  Clear  P & A; w/o, wheezes/ rales/ or rhonchi.no accessory muscle use, no dullness to percussion  CARD:  RRR, no m/r/g  , no peripheral edema, pulses intact, no cyanosis or clubbing.  GI:   Soft & nt; nml bowel sounds; no organomegaly or masses  detected.  Musco: Warm bil, no deformities or joint swelling noted.   Neuro: alert, no focal deficits noted. ,    Skin: Warm, hyperpigmented area along right forearm .    CXR 09/13/15 >nad , lungs clear     Assessment & Plan:

## 2015-10-01 NOTE — Telephone Encounter (Signed)
Per 10/01/15 OV with TP today: Patient Instructions       Decreased Prednisone 20mg  daily for 2 week then 10mg  daily Hold at this dose   Labs today   Follow up in 4 weeks with Dr. Halford Chessman  .   Please contact office for sooner follow up if symptoms do not improve or worsen or seek emergency care      --  Dr. Halford Chessman do you want to overbook or have pt come back in and see TP since nothing available? thanks

## 2015-10-01 NOTE — Assessment & Plan Note (Signed)
Chest wall pain ? Etiology -appears to be musculoskeletal in nature as sore to touch and constant aching sensation. Unclear if sarcoid related -no sign improvement with steroids which would be atypical for sarcoid.  Recent ER evaluation with neg EKG  Prev CT chest 04/2015 neg for PE, decreased adenopathy.  Cont w/ pain meds. As needed.  Warm heat. Marland Kitchen

## 2015-10-01 NOTE — Progress Notes (Signed)
   Subjective:    Patient ID: [name], female    DOB: 16-Dec-1965, 50 y.o.   MRN: [id number]  HPI 50 yo female with sarcoidosis w/ mediastinal adenopathy and skeletal involvement.   TESTS: 09/18/14 CT head >> 12 x 6 mm Lt frontal lytic lesion 09/18/14 MRI brain >> 12 mm Lt frontal lytic lesion 09/18/14 Bone scan >> no other lesions 09/19/14 CT chest >> Rt paratracheal LAN 1.5 cm, Rt subcarinal LAN 2 cm, Rt hilar LAN 1.4 cm, Lt hilar LAN 1.7 cm, lungs clear  09/19/14 CT abd/pelvis >> 1.6 cm LAN at gastrohepatic ligament, 1.3 cm LAN at portal vein, 0.9 cm LAN Rt renal artery 10/26/14 Mediastinoscopy >> granulomas 11/12/14 Start prednisone 12/05/14 Skull lesion bx >non necrotizing granulomas  /14/16 Start prednisone 01/01/15 ACE >> 26  02/05/15 PFT >> FEV1 2.96 (94%), FEV1% 80, TLC 5.27 (95%), DLCO 80%, +BD CT chest 04/30/15 >> decreased mediastinal and hilar LAN, stable 2 mm nodule RML   10/01/2015 Follow up :   Sarcoid  Joint pain , chest wall pain.     Review of Systems Constitutional:   No  weight loss, night sweats,  Fevers, chills, + fatigue, or  lassitude.  HEENT:      Difficulty swallowing,  Tooth/dental problems, or  Sore throat,                No sneezing, itching, ear ache, nasal congestion, post nasal drip,   CV:  No chest pain,  Orthopnea, PND, swelling in lower extremities, anasarca, dizziness, palpitations, syncope.   GI  No heartburn, indigestion, abdominal pain, nausea, vomiting, diarrhea, change in bowel habits, loss of appetite, bloody stools.   Resp: .  No excess mucus, no productive cough,  No non-productive cough,  No coughing up of blood.  No change in color of mucus.  No wheezing.  No chest wall deformity  Skin: no rash or lesions.  GU: no dysuria, change in color of urine, no urgency or frequency.  No flank pain, no hematuria   MS:  +joint pain . No swelling.   Psych:  +change in mood or affect. No depression or anxiety.  No memory loss.           Objective:   Physical Exam Filed Vitals:   10/01/15 1623  BP: 126/78  Pulse: 88  Temp: 98.3 F (36.8 C)  TempSrc: Oral  Height: 5\' 6"  (1.676 m)  Weight: 188 lb (85.276 kg)  SpO2: 97%    GEN: A/Ox3; pleasant , NAD, well nourished   HEENT:  [place]/AT,  EACs-clear, TMs-wnl, NOSE-clear, THROAT-clear, no lesions, no postnasal drip or exudate noted.   NECK:  Supple w/ fair ROM; no JVD; normal carotid impulses w/o bruits; no thyromegaly or nodules palpated; no lymphadenopathy.  RESP  Clear  P & A; w/o, wheezes/ rales/ or rhonchi.no accessory muscle use, no dullness to percussion  CARD:  RRR, no m/r/g  , no peripheral edema, pulses intact, no cyanosis or clubbing.  GI:   Soft & nt; nml bowel sounds; no organomegaly or masses detected.  Musco: Warm bil, no deformities or joint swelling noted.   Neuro: alert, no focal deficits noted. ,    Skin: Warm, hyperpigmented area along right forearm .    CXR 09/13/15 >nad , lungs clear     Assessment & Plan:

## 2015-10-01 NOTE — Patient Instructions (Signed)
Decreased Prednisone 20mg  daily for 2 week then 10mg  daily Hold at this dose  Labs today  Follow up in 4 weeks with Dr. Halford Chessman  .  Please contact office for sooner follow up if symptoms do not improve or worsen or seek emergency care

## 2015-10-01 NOTE — Assessment & Plan Note (Signed)
Sarcoid with lymphadenopathy and skeletal involvement  Extensive workup with no apparent lung parenchymal involvement  PFT normal  Rheumatology consult with no apparent inflammatory arthritis  Neuro consult w/ no apparent CNS involvement w/ neg MRI/bone scan  except for skeletal Involvement (skull lesion)  ACE level and adenopathy did decrease with steroids however she has not had clinical  Improvement with persist chest wall pain, polyarthritis , sob and fatigue.  Recently restarted on prednisone 20-30 mg without significant improvement -should have had some improvement with dose of steroids if sarcoid related.  Will decrease steroids as side effects w/ elevated blood sugars , wt gain.  Consider steroid sparing agent on return ? Methotrexate.  Check ACE level today .  Consider referral to ortho vs rheumatology as persistent joint pain .

## 2015-10-01 NOTE — Telephone Encounter (Signed)
Can over book with me. 

## 2015-10-01 NOTE — Progress Notes (Signed)
Reviewed and agree with assessment/plan.  ?if she has alternate dx besides sarcoidosis causing her most recent symptoms.

## 2015-10-02 LAB — ANGIOTENSIN CONVERTING ENZYME: Angiotensin-Converting Enzyme: 19 U/L (ref 8–52)

## 2015-10-02 NOTE — Telephone Encounter (Signed)
Called mobile # for pt and was advised I had the wrong number. Confirmed # in chart and was advised I dialed the correct number but no one there by patient name. Called home # for pt--line rang numerous times, and no VM. WCB

## 2015-10-02 NOTE — Telephone Encounter (Signed)
ATC home, NA and no option to leave msg,WCB

## 2015-10-03 NOTE — Progress Notes (Signed)
Quick Note:  LVM with husband for patient to return call. ______

## 2015-10-04 NOTE — Telephone Encounter (Signed)
ATC pt at home #--line rang several times, NA and no VM. WCB

## 2015-10-07 NOTE — Telephone Encounter (Signed)
atc home #, unnamed man stated I had the wrong number.  Verified number dialed.  Mobile number listed below is noted as a wrong number also.  Pt's EC is listed as her spouse, with same home # but different mobile # [email or web address] on pt's spouse's mobile #.

## 2015-10-08 NOTE — Telephone Encounter (Signed)
Spoke with pt on her work line. OV has been scheduled for 10/29/15 at 4pm. Nothing further was needed.

## 2015-10-08 NOTE — Progress Notes (Signed)
Quick Note:  LM with pt's husband. He stated she was and work and he would have her return our call. ______

## 2015-10-29 ENCOUNTER — Ambulatory Visit (INDEPENDENT_AMBULATORY_CARE_PROVIDER_SITE_OTHER): Payer: BLUE CROSS/BLUE SHIELD | Admitting: Pulmonary Disease

## 2015-10-29 ENCOUNTER — Encounter: Payer: Self-pay | Admitting: Pulmonary Disease

## 2015-10-29 VITALS — BP 112/82 | HR 84 | Ht 66.0 in | Wt 188.0 lb

## 2015-10-29 DIAGNOSIS — D869 Sarcoidosis, unspecified: Secondary | ICD-10-CM

## 2015-10-29 DIAGNOSIS — M2550 Pain in unspecified joint: Secondary | ICD-10-CM

## 2015-10-29 MED ORDER — PREDNISONE 5 MG PO TABS: 5.0000 mg | ORAL_TABLET | Freq: Every day | ORAL | Status: DC

## 2015-10-29 NOTE — Patient Instructions (Signed)
Will arrange for referral to Dr. Berna Bue with rheumatology  Change prednisone to 5 mg daily  Follow up in 8 weeks

## 2015-10-29 NOTE — Progress Notes (Signed)
Current Outpatient Prescriptions on File Prior to Visit  Medication Sig  . albuterol (PROVENTIL HFA;VENTOLIN HFA) 108 (90 BASE) MCG/ACT inhaler Inhale 2 puffs into the lungs every 6 (six) hours as needed. For shortness of breath  . beclomethasone (QVAR) 40 MCG/ACT inhaler Inhale 2 puffs into the lungs 2 (two) times daily.  Marland Kitchen buPROPion (WELLBUTRIN XL) 300 MG 24 hr tablet Take 300 mg by mouth daily. Reported on 08/20/2015  . diazepam (VALIUM) 2 MG tablet Take 2 mg by mouth at bedtime.  . dicyclomine (BENTYL) 20 MG tablet Take 20 mg by mouth every 6 (six) hours as needed for spasms. Reported on 09/03/2015  . Diethylpropion HCl CR 75 MG TB24 Take 75 mg by mouth daily.  Marland Kitchen levothyroxine (SYNTHROID, LEVOTHROID) 137 MCG tablet Take 137 mcg by mouth every morning.   Marland Kitchen LORazepam (ATIVAN) 1 MG tablet Take 1 tablet (1 mg total) by mouth 3 times/day as needed-between meals & bedtime for anxiety.  . metFORMIN (GLUCOPHAGE) 500 MG tablet Take 1 tablet by mouth daily.  . naproxen sodium (ANAPROX) 220 MG tablet Take 440 mg by mouth 2 (two) times daily as needed (for pain).   Marland Kitchen omeprazole (PRILOSEC) 40 MG capsule Take 40 mg by mouth at bedtime.  Marland Kitchen PARoxetine (PAXIL) 20 MG tablet Take 20 mg by mouth at bedtime.  . predniSONE (DELTASONE) 10 MG tablet Take 2 tablets by mouth x 2 week then 1 tablet daily and hold at this dose (Patient taking differently: Take 10 mg by mouth daily with breakfast. )  . rOPINIRole (REQUIP) 2 MG tablet Take 2 mg by mouth at bedtime.  . traMADol (ULTRAM) 50 MG tablet Take 1 tablet (50 mg total) by mouth every 6 (six) hours as needed. (Patient taking differently: Take 50 mg by mouth every 6 (six) hours as needed for moderate pain. )  . traZODone (DESYREL) 50 MG tablet Take 50 mg by mouth at bedtime.  . valACYclovir (VALTREX) 500 MG tablet Take 500 mg by mouth at bedtime.   No current facility-administered medications on file prior to visit.    Chief Complaint  Patient presents with  .  Follow-up    States that SOB is unchanged. LIttle chest wall pain still present. Discuss Pred - current dose 10mg , patient wishes to taper off.    Events 09/20/14 Quantiferon gold >> negative 10/26/14 Mediastinoscopy >> granulomas 11/12/14 Start prednisone  Pulmonary function tests 02/05/15 >> FEV1 2.96 (94%), FEV1% 80, TLC 5.27 (95%), DLCO 80%, +BD  Imaging 09/18/14 CT head >> 12 x 6 mm Lt frontal lytic lesion 09/18/14 MRI brain >> 12 mm Lt frontal lytic lesion 09/18/14 Bone scan >> no other lesions 09/19/14 CT chest >> Rt paratracheal LAN 1.5 cm, Rt subcarinal LAN 2 cm, Rt hilar LAN 1.4 cm, Lt hilar LAN 1.7 cm 09/19/14 CT abd/pelvis >> 1.6 cm LAN at gastrohepatic ligament, 1.3 cm LAN at portal vein, 0.9 cm LAN Rt renal artery 04/30/15 CT chest >> decreased mediastinal and hilar LAN, stable 2 mm nodule RML  Serology 09/20/14 ACE 76, ANA negative 01/01/15 ACE 26 09/03/15 ACE 60 10/01/15 ACE 19  Past medical hx GERD, IBS, RLS, Depression, Anxiety, Hypothyroidism, Interstitial cystitis, Chronic back pain  Past surgical hx, Medications, Allergies, Family hx, Social hx all reviewed.  Vital signs Blood pressure 112/82, pulse 84, height 5\' 6"  (1.676 m), weight 188 lb (85.276 kg), last menstrual period 06/03/2012, SpO2 96 %.  History of Present Illness: [name] is a 50 y.o. female with  systemic sarcoidosis and asthma.  She has been feeling better recently.  She is not having as much chest pain.  She was getting pain along her costochondral margins Lt > Rt and would feel like her sternum was going to pop out.  She has also been getting stiffness in her wrist, knees, and ankles.  Her skin lesions on Lt arm have improved.  She denies cough, wheeze, sputum, or hemoptysis.  She is not getting headaches as frequently.  Physical Exam:  General - No distress ENT - no sinus tenderness, no oral exudate, no LAN Cardiac - s1s2 regular, no murmur Chest - No wheeze/rales/dullness Back - No focal  tenderness Abd - Soft, non-tender Ext - No edema Neuro - Normal strength Skin - areas of ecchymosis on Lt forearm Psych - normal mood, and behavior   Assessment/Plan:  Systemic sarcoidosis. Plan: - will have her decrease dose of prednisone to 5 mg daily - will arrange for referral to rheumatology to further assess her complaints of polyarthralgia >> further plans for prednisone weaning based on input from rheumatology  Asthma >> could be part of sarcoidosis involvement of airways. Plan: - continue Qvar with prn albuterol  Dermatitis left arm. Uncertain whether this is related to sarcoid.  She has been seen by dermatology.  Improving. Plan: - she is to follow up with dermatology if lesions get worse   Patient Instructions  Will arrange for referral to Dr. Berna Bue with rheumatology  Change prednisone to 5 mg daily  Follow up in 8 weeks     Sabrina Mires, MD [place] Pager:  [phone]

## 2015-10-31 ENCOUNTER — Encounter: Payer: Self-pay | Admitting: Pulmonary Disease

## 2015-12-03 DIAGNOSIS — D8689 Sarcoidosis of other sites: Secondary | ICD-10-CM | POA: Diagnosis not present

## 2015-12-03 DIAGNOSIS — Z1589 Genetic susceptibility to other disease: Secondary | ICD-10-CM | POA: Diagnosis not present

## 2015-12-03 DIAGNOSIS — M2550 Pain in unspecified joint: Secondary | ICD-10-CM | POA: Diagnosis not present

## 2015-12-03 DIAGNOSIS — M459 Ankylosing spondylitis of unspecified sites in spine: Secondary | ICD-10-CM | POA: Diagnosis not present

## 2015-12-11 ENCOUNTER — Ambulatory Visit (INDEPENDENT_AMBULATORY_CARE_PROVIDER_SITE_OTHER): Payer: BLUE CROSS/BLUE SHIELD | Admitting: Pulmonary Disease

## 2015-12-11 ENCOUNTER — Encounter: Payer: Self-pay | Admitting: Pulmonary Disease

## 2015-12-11 VITALS — BP 128/72 | HR 94 | Ht 66.5 in | Wt 187.0 lb

## 2015-12-11 DIAGNOSIS — M2550 Pain in unspecified joint: Secondary | ICD-10-CM

## 2015-12-11 DIAGNOSIS — J4520 Mild intermittent asthma, uncomplicated: Secondary | ICD-10-CM | POA: Diagnosis not present

## 2015-12-11 DIAGNOSIS — D869 Sarcoidosis, unspecified: Secondary | ICD-10-CM | POA: Diagnosis not present

## 2015-12-11 NOTE — Progress Notes (Signed)
Current Outpatient Prescriptions on File Prior to Visit  Medication Sig  . albuterol (PROVENTIL HFA;VENTOLIN HFA) 108 (90 BASE) MCG/ACT inhaler Inhale 2 puffs into the lungs every 6 (six) hours as needed. For shortness of breath  . beclomethasone (QVAR) 40 MCG/ACT inhaler Inhale 2 puffs into the lungs 2 (two) times daily.  Marland Kitchen buPROPion (WELLBUTRIN XL) 300 MG 24 hr tablet Take 300 mg by mouth daily. Reported on 08/20/2015  . diazepam (VALIUM) 2 MG tablet Take 2 mg by mouth at bedtime.  . Diethylpropion HCl CR 75 MG TB24 Take 75 mg by mouth daily.  Marland Kitchen levothyroxine (SYNTHROID, LEVOTHROID) 137 MCG tablet Take 137 mcg by mouth every morning.   Marland Kitchen LORazepam (ATIVAN) 1 MG tablet Take 1 tablet (1 mg total) by mouth 3 times/day as needed-between meals & bedtime for anxiety.  . naproxen sodium (ANAPROX) 220 MG tablet Take 440 mg by mouth 2 (two) times daily as needed (for pain).   Marland Kitchen omeprazole (PRILOSEC) 40 MG capsule Take 40 mg by mouth at bedtime.  Marland Kitchen PARoxetine (PAXIL) 20 MG tablet Take 20 mg by mouth at bedtime.  . predniSONE (DELTASONE) 5 MG tablet Take 1 tablet (5 mg total) by mouth daily with breakfast.  . rOPINIRole (REQUIP) 2 MG tablet Take 2 mg by mouth at bedtime.  . traMADol (ULTRAM) 50 MG tablet Take 1 tablet (50 mg total) by mouth every 6 (six) hours as needed. (Patient taking differently: Take 50 mg by mouth every 6 (six) hours as needed for moderate pain. )  . traZODone (DESYREL) 50 MG tablet Take 50 mg by mouth at bedtime.  . valACYclovir (VALTREX) 500 MG tablet Take 500 mg by mouth at bedtime.   No current facility-administered medications on file prior to visit.    Chief Complaint  Patient presents with  . Follow-up    pt states breathing is baseline. c/o SOB, non prod cough, chest pain/tightness.    Events 09/20/14 Quantiferon gold >> negative 10/26/14 Mediastinoscopy >> granulomas 11/12/14 Start prednisone  Pulmonary function tests 02/05/15 >> FEV1 2.96 (94%), FEV1% 80, TLC 5.27  (95%), DLCO 80%, +BD  Imaging 09/18/14 CT head >> 12 x 6 mm Lt frontal lytic lesion 09/18/14 MRI brain >> 12 mm Lt frontal lytic lesion 09/18/14 Bone scan >> no other lesions 09/19/14 CT chest >> Rt paratracheal LAN 1.5 cm, Rt subcarinal LAN 2 cm, Rt hilar LAN 1.4 cm, Lt hilar LAN 1.7 cm 09/19/14 CT abd/pelvis >> 1.6 cm LAN at gastrohepatic ligament, 1.3 cm LAN at portal vein, 0.9 cm LAN Rt renal artery 04/30/15 CT chest >> decreased mediastinal and hilar LAN, stable 2 mm nodule RML  Serology 09/20/14 ACE 76, ANA negative 01/01/15 ACE 26 09/03/15 ACE 60 10/01/15 ACE 19  Past medical hx GERD, IBS, RLS, Depression, Anxiety, Hypothyroidism, Interstitial cystitis, Chronic back pain, HLA B27 positive  Past surgical hx, Medications, Allergies, Family hx, Social hx all reviewed.  Vital signs Blood pressure 128/72, pulse 94, height 5' 6.5" (1.689 m), weight 187 lb (84.823 kg), last menstrual period 06/03/2012, SpO2 99 %.  History of Present Illness: [name] is a 50 y.o. female with systemic sarcoidosis and asthma.  Since her last visit, she was seen by Dr. Lenna Gilford with rheumatology >> HLA B27 positive.  She remains on prednisone with plan to transition to humira.  She still gets joint pains and stiffness.  She has intermittent headaches with crusting around her eyes, and sinus pressure.  She still has a cough.  This  is usual dry.  She is not wheezing, and denies fever.  She has been using Qvar and Ventolin >> these help.  She needs to use Ventolin 4 to 6 times per week.  Physical Exam:  General - No distress ENT - no sinus tenderness, no oral exudate, no LAN Cardiac - s1s2 regular, no murmur Chest - No wheeze/rales/dullness Back - No focal tenderness Abd - Soft, non-tender Ext - No edema Neuro - Normal strength Skin - areas of ecchymosis on Lt forearm Psych - normal mood, and behavior   Assessment/Plan:  Systemic sarcoidosis. I think her lung involvement with sarcoid is stable  at this time. Plan: - prednisone per rheumatology  Polyarthritis with HLA B27 positive. - f/u with rheumatology with plan to start humira  Asthma >> could be part of sarcoidosis involvement of airways. Plan: - will have her try Breo in place of Qvar >> sample given; if Memory Dance works better, then she will call to get at prescription - continue prn ventolin  Dermatitis left arm. Uncertain whether this is related to sarcoid.  She has been seen by dermatology.  Improving. Plan: - she is to follow up with dermatology if lesions get worse   Patient Instructions  Try breo one puff daily >> rinse mouth after each use Don't use Qvar while using breo Continue ventolin 2 puff every 4 to 6 hours as needed  Follow up in 4 months     Chesley Mires, MD [place] [facility] Pager:  [phone] 12/11/2015, 5:39 PM

## 2015-12-11 NOTE — Patient Instructions (Signed)
Try breo one puff daily >> rinse mouth after each use Don't use Qvar while using breo Continue ventolin 2 puff every 4 to 6 hours as needed  Follow up in 4 months

## 2015-12-17 DIAGNOSIS — E559 Vitamin D deficiency, unspecified: Secondary | ICD-10-CM | POA: Diagnosis not present

## 2015-12-17 DIAGNOSIS — E611 Iron deficiency: Secondary | ICD-10-CM | POA: Diagnosis not present

## 2015-12-17 DIAGNOSIS — E039 Hypothyroidism, unspecified: Secondary | ICD-10-CM | POA: Diagnosis not present

## 2015-12-17 DIAGNOSIS — N951 Menopausal and female climacteric states: Secondary | ICD-10-CM | POA: Diagnosis not present

## 2015-12-17 DIAGNOSIS — E538 Deficiency of other specified B group vitamins: Secondary | ICD-10-CM | POA: Diagnosis not present

## 2015-12-18 ENCOUNTER — Encounter: Payer: Self-pay | Admitting: Pulmonary Disease

## 2015-12-18 ENCOUNTER — Other Ambulatory Visit: Payer: Self-pay | Admitting: Family Medicine

## 2015-12-18 ENCOUNTER — Ambulatory Visit
Admission: RE | Admit: 2015-12-18 | Discharge: 2015-12-18 | Disposition: A | Discharge status: Home / Self Care | Payer: BLUE CROSS/BLUE SHIELD | Source: Ambulatory Visit | Attending: Family Medicine | Admitting: Family Medicine

## 2015-12-18 DIAGNOSIS — M4322 Fusion of spine, cervical region: Secondary | ICD-10-CM | POA: Diagnosis not present

## 2015-12-18 DIAGNOSIS — M4327 Fusion of spine, lumbosacral region: Secondary | ICD-10-CM | POA: Diagnosis not present

## 2015-12-18 DIAGNOSIS — Z1589 Genetic susceptibility to other disease: Secondary | ICD-10-CM

## 2015-12-18 DIAGNOSIS — M542 Cervicalgia: Secondary | ICD-10-CM

## 2015-12-19 MED ORDER — FLUTICASONE FUROATE-VILANTEROL 100-25 MCG/INH IN AEPB: 1.0000 | INHALATION_SPRAY | Freq: Every day | RESPIRATORY_TRACT | Status: DC

## 2016-01-15 DIAGNOSIS — J209 Acute bronchitis, unspecified: Secondary | ICD-10-CM | POA: Diagnosis not present

## 2016-01-15 DIAGNOSIS — J019 Acute sinusitis, unspecified: Secondary | ICD-10-CM | POA: Diagnosis not present

## 2016-01-17 ENCOUNTER — Emergency Department (HOSPITAL_COMMUNITY)
Admission: EM | Admit: 2016-01-17 | Discharge: 2016-01-17 | Disposition: A | Discharge status: Home / Self Care | Payer: BLUE CROSS/BLUE SHIELD | Attending: Emergency Medicine | Admitting: Emergency Medicine

## 2016-01-17 ENCOUNTER — Encounter (HOSPITAL_COMMUNITY): Payer: Self-pay | Admitting: Emergency Medicine

## 2016-01-17 ENCOUNTER — Emergency Department (HOSPITAL_COMMUNITY): Payer: BLUE CROSS/BLUE SHIELD

## 2016-01-17 DIAGNOSIS — R52 Pain, unspecified: Secondary | ICD-10-CM

## 2016-01-17 DIAGNOSIS — R51 Headache: Secondary | ICD-10-CM | POA: Insufficient documentation

## 2016-01-17 DIAGNOSIS — Z7952 Long term (current) use of systemic steroids: Secondary | ICD-10-CM | POA: Insufficient documentation

## 2016-01-17 DIAGNOSIS — R519 Headache, unspecified: Secondary | ICD-10-CM

## 2016-01-17 DIAGNOSIS — F329 Major depressive disorder, single episode, unspecified: Secondary | ICD-10-CM | POA: Diagnosis not present

## 2016-01-17 DIAGNOSIS — R011 Cardiac murmur, unspecified: Secondary | ICD-10-CM | POA: Insufficient documentation

## 2016-01-17 DIAGNOSIS — Z8619 Personal history of other infectious and parasitic diseases: Secondary | ICD-10-CM | POA: Diagnosis not present

## 2016-01-17 DIAGNOSIS — E039 Hypothyroidism, unspecified: Secondary | ICD-10-CM | POA: Diagnosis not present

## 2016-01-17 DIAGNOSIS — Z7951 Long term (current) use of inhaled steroids: Secondary | ICD-10-CM | POA: Insufficient documentation

## 2016-01-17 DIAGNOSIS — M159 Polyosteoarthritis, unspecified: Secondary | ICD-10-CM | POA: Diagnosis not present

## 2016-01-17 DIAGNOSIS — K219 Gastro-esophageal reflux disease without esophagitis: Secondary | ICD-10-CM | POA: Insufficient documentation

## 2016-01-17 DIAGNOSIS — Z862 Personal history of diseases of the blood and blood-forming organs and certain disorders involving the immune mechanism: Secondary | ICD-10-CM | POA: Diagnosis not present

## 2016-01-17 DIAGNOSIS — Z87891 Personal history of nicotine dependence: Secondary | ICD-10-CM | POA: Diagnosis not present

## 2016-01-17 DIAGNOSIS — Z8742 Personal history of other diseases of the female genital tract: Secondary | ICD-10-CM | POA: Diagnosis not present

## 2016-01-17 DIAGNOSIS — J45909 Unspecified asthma, uncomplicated: Secondary | ICD-10-CM | POA: Diagnosis not present

## 2016-01-17 DIAGNOSIS — G8929 Other chronic pain: Secondary | ICD-10-CM | POA: Diagnosis not present

## 2016-01-17 DIAGNOSIS — M542 Cervicalgia: Secondary | ICD-10-CM | POA: Insufficient documentation

## 2016-01-17 DIAGNOSIS — Z79899 Other long term (current) drug therapy: Secondary | ICD-10-CM | POA: Diagnosis not present

## 2016-01-17 DIAGNOSIS — F419 Anxiety disorder, unspecified: Secondary | ICD-10-CM | POA: Diagnosis not present

## 2016-01-17 LAB — CBC
HEMATOCRIT: 38.8 % (ref 36.0–46.0)
HEMOGLOBIN: 12.9 g/dL (ref 12.0–15.0)
MCH: 28.4 pg (ref 26.0–34.0)
MCHC: 33.2 g/dL (ref 30.0–36.0)
MCV: 85.3 fL (ref 78.0–100.0)
Platelets: 236 10*3/uL (ref 150–400)
RBC: 4.55 MIL/uL (ref 3.87–5.11)
RDW: 14.2 % (ref 11.5–15.5)
WBC: 7.1 10*3/uL (ref 4.0–10.5)

## 2016-01-17 LAB — BASIC METABOLIC PANEL
Anion gap: 9 (ref 5–15)
BUN: 17 mg/dL (ref 6–20)
CO2: 23 mmol/L (ref 22–32)
CREATININE: 0.87 mg/dL (ref 0.44–1.00)
Calcium: 9.4 mg/dL (ref 8.9–10.3)
Chloride: 107 mmol/L (ref 101–111)
GFR calc Af Amer: >60 mL/min (ref >60)
Glucose, Bld: 159 mg/dL — ABNORMAL HIGH (ref 65–99)
Potassium: 4.2 mmol/L (ref 3.5–5.1)
SODIUM: 139 mmol/L (ref 135–145)

## 2016-01-17 LAB — URINALYSIS, ROUTINE W REFLEX MICROSCOPIC
BILIRUBIN URINE: NEGATIVE
GLUCOSE, UA: NEGATIVE mg/dL
HGB URINE DIPSTICK: NEGATIVE
Ketones, ur: NEGATIVE mg/dL
Leukocytes, UA: NEGATIVE
Nitrite: NEGATIVE
PH: 6 (ref 5.0–8.0)
Protein, ur: NEGATIVE mg/dL
SPECIFIC GRAVITY, URINE: 1.007 (ref 1.005–1.030)

## 2016-01-17 MED ORDER — METHOCARBAMOL 500 MG PO TABS: 500.0000 mg | ORAL_TABLET | Freq: Two times a day (BID) | ORAL | Status: DC | PRN

## 2016-01-17 MED ORDER — KETOROLAC TROMETHAMINE 30 MG/ML IJ SOLN
30.0000 mg | Freq: Once | INTRAMUSCULAR | Status: AC
Start: 2016-01-17 — End: 2016-01-17
  Administered 2016-01-17: 30 mg via INTRAVENOUS
  Filled 2016-01-17: qty 1

## 2016-01-17 MED ORDER — PROCHLORPERAZINE EDISYLATE 5 MG/ML IJ SOLN
10.0000 mg | Freq: Once | INTRAMUSCULAR | Status: AC
  Administered 2016-01-17: 10 mg via INTRAVENOUS
  Filled 2016-01-17: qty 2

## 2016-01-17 MED ORDER — IOPAMIDOL (ISOVUE-370) INJECTION 76%
INTRAVENOUS | Status: AC
  Administered 2016-01-17: 50 mL
  Filled 2016-01-17: qty 50

## 2016-01-17 MED ORDER — NAPROXEN 500 MG PO TABS: 500.0000 mg | ORAL_TABLET | Freq: Two times a day (BID) | ORAL | Status: DC

## 2016-01-17 NOTE — ED Notes (Addendum)
Left neck pain and pressure with dizziness since Wednesday. Goes up back of head and into left eye. Went to walk-in clinic on Wednesday and given antibiotics for sinus infection. Taking antibiotics without improvement. Feel tired and run down. Denies fever or chills at home.

## 2016-01-17 NOTE — ED Notes (Signed)
Pt stable, ambulatory, states understanding of discharge instructions 

## 2016-01-17 NOTE — ED Notes (Signed)
Pt states pain in her neck is getting worse. Nurse Janett Billow RN notified.

## 2016-01-17 NOTE — ED Provider Notes (Signed)
CSN: [id number]     Arrival date & time 01/17/16  1413 History   First MD Initiated Contact with Patient 01/17/16 1640     Chief Complaint  Patient presents with  . Neck Pain  . Headache     (Consider location/radiation/quality/duration/timing/severity/associated sxs/prior Treatment) HPI  The pt is 50 y/o female - she has 2 days of throbbing pain in the neck which spread to the top of the head on the L - also radiates to the shoulder - she has tried [employer] / oral dilaudid, tramadol, was at [facility] and had doxy for sinusitis - no improvement - feels it is worsening, no visual changes / defecits - generalized weakness - non focal.  Constant headache - different from her migraines (diagnosed).  Past Medical History  Diagnosis Date  . IC (interstitial cystitis)   . RLS (restless legs syndrome)   . GERD (gastroesophageal reflux disease)   . Depression   . Hypothyroidism   . Environmental allergies   . IBS (irritable bowel syndrome)   . Frequency of urination   . Nocturia   . SUI (stress urinary incontinence, female)   . Erosion of suburethral sling ([place])   . Herpes simplex type 2 infection   . NSVD (normal spontaneous vaginal delivery)     x3  . Asthma   . Chronic bronchitis ([place])     "get it close to q yr" (09/18/2014)  . Migraine     "maybe once/month" (09/18/2014)  . Headache     "at least several times/wk" (09/18/2014)  . Seizures ([place])     "when I was a teenager"  . Chronic back pain     "whole spine"  . Anxiety   . Shortness of breath dyspnea     Exertion  . Sarcoid ([place])   . Heart murmur     "when I was a baby"  . Arthritis     "back, hands, neck" (09/18/2014)  . HLA B27 positive    Past Surgical History  Procedure Laterality Date  . Cesarean section  2000  . Pelvic laparoscopy  1990's    LYSIS ADHESIONS  . Tonsillectomy  1986  . Appendectomy  1985  . Anterior cervical decomp/discectomy fusion  2011  . Abdominal hysterectomy  2006  . Knee arthroscopy Right   .  Lumbar fusion  12-04-2008    L4  -- S1  . Lumbar disc surgery  02-02-2007    RIGHT SIDE L5 -- S1  . Lynx retropubic suburethral sling  03-02-2007  . Tubal ligation  2001    hulka clip  . Pubovaginal sling N/A 11/30/2012    Procedure: Gaynelle Arabian;  Surgeon: Bernestine Amass, MD;  Location[facility];  Service: Urology;  Laterality: N/A;  1 HR EXAM UNDER ANESTHESIA, EXCISION OF SUB URETHRAL MESH, CYSTO, HOD   . Cysto with hydrodistension N/A 11/30/2012    Procedure: CYSTOSCOPY/HYDRODISTENSION;  Surgeon: Bernestine Amass, MD;  Location[facility];  Service: Urology;  Laterality: N/A;  . Video bronchoscopy with endobronchial ultrasound N/A 09/21/2014    Procedure: VIDEO BRONCHOSCOPY WITH ENDOBRONCHIAL ULTRASOUND;  Surgeon: Collene Gobble, MD;  Location: [place];  Service: Thoracic;  Laterality: N/A;  . Mediastinoscopy N/A 10/26/2014    Procedure: MEDIASTINOSCOPY;  Surgeon: Melrose Nakayama, MD;  Location: [facility];  Service: Thoracic;  Laterality: N/A;  . Back surgery  2008    X 2 in 2010  . Craniectomy for depressed skull fracture Left 12/05/2014  Procedure: Left frontal stereotactic craniectomy for biopsy of skull lesion;  Surgeon: Consuella Lose, MD;  Location: [place];  Service: Neurosurgery;  Laterality: Left;  Left frontal stereotactic craniectomy for biopsy of skull lesion   Family History  Problem Relation Age of Onset  . Diabetes Mother   . Hypertension Mother   . Heart disease Mother   . Cancer Father     LUNG AND BRAIN  . Hypertension Father   . Heart disease Maternal Grandmother   . Cancer Maternal Grandfather     LUNG  . Heart disease Paternal Grandmother   . Cancer Paternal Grandfather    Social History  Substance Use Topics  . Smoking status: Former Smoker -- 0.50 packs/day for 15 years    Types: Cigarettes    Quit date: 01/15/2014  . Smokeless tobacco: Never Used  . Alcohol Use: 0.0 oz/week    0 Standard drinks or equivalent  per week     Comment: ocassional   OB History    Gravida Para Term Preterm AB TAB SAB Ectopic Multiple Living   '4 4        4     '$ Review of Systems  All other systems reviewed and are negative.     Allergies  Bactrim; Percocet; and Vicodin  Home Medications   Prior to Admission medications   Medication Sig Start Date End Date Taking? Authorizing Provider  albuterol (PROVENTIL HFA;VENTOLIN HFA) 108 (90 BASE) MCG/ACT inhaler Inhale 2 puffs into the lungs every 6 (six) hours as needed. For shortness of breath 04/25/13  Yes Elayne Snare, MD  buPROPion (WELLBUTRIN XL) 300 MG 24 hr tablet Take 300 mg by mouth daily. Reported on 08/20/2015 01/15/15  Yes Historical Provider, MD  diazepam (VALIUM) 2 MG tablet Take 2 mg by mouth at bedtime.   Yes Historical Provider, MD  doxycycline (VIBRA-TABS) 100 MG tablet Take 100 mg by mouth 2 (two) times daily. 01/16/16  Yes Historical Provider, MD  fluticasone furoate-vilanterol (BREO ELLIPTA) 100-25 MCG/INH AEPB Inhale 1 puff into the lungs daily. 12/19/15  Yes Chesley Mires, MD  levothyroxine (SYNTHROID, LEVOTHROID) 137 MCG tablet Take 137 mcg by mouth every morning.    Yes Historical Provider, MD  naproxen sodium (ANAPROX) 220 MG tablet Take 660 mg by mouth 2 (two) times daily as needed (for pain).    Yes Historical Provider, MD  omeprazole (PRILOSEC) 40 MG capsule Take 40 mg by mouth at bedtime.   Yes Historical Provider, MD  ondansetron (ZOFRAN-ODT) 4 MG disintegrating tablet Take 4 mg by mouth every 8 (eight) hours as needed for nausea or vomiting.  01/16/16  Yes Historical Provider, MD  PARoxetine (PAXIL) 40 MG tablet Take 40 mg by mouth at bedtime. 11/29/15  Yes Historical Provider, MD  predniSONE (DELTASONE) 5 MG tablet Take 1 tablet (5 mg total) by mouth daily with breakfast. 10/29/15  Yes Chesley Mires, MD  rOPINIRole (REQUIP) 2 MG tablet Take 2 mg by mouth at bedtime.   Yes Historical Provider, MD  traMADol (ULTRAM) 50 MG tablet Take 1 tablet (50 mg  total) by mouth every 6 (six) hours as needed. Patient taking differently: Take 50 mg by mouth every 6 (six) hours as needed for moderate pain.  01/08/15  Yes Hollace Kinnier Sofia, PA-C  traZODone (DESYREL) 50 MG tablet Take 50 mg by mouth at bedtime.   Yes Historical Provider, MD  valACYclovir (VALTREX) 500 MG tablet Take 500 mg by mouth at bedtime.   Yes Historical Provider, MD  vitamin B-12 (CYANOCOBALAMIN) 1000 MCG tablet Take 1,000 mcg by mouth every morning.   Yes Historical Provider, MD  Vitamin D, Ergocalciferol, (DRISDOL) 50000 units CAPS capsule Take 50,000 Units by mouth every Monday. 12/23/15  Yes Historical Provider, MD  vitamin E (VITAMIN E) 400 UNIT capsule Take 400 Units by mouth at bedtime.   Yes Historical Provider, MD  fluticasone (FLONASE) 50 MCG/ACT nasal spray Place 1 spray into both nostrils daily.  01/16/16   Historical Provider, MD  HUMIRA PEN 40 MG/0.8ML PNKT Take 40 mg by mouth every 14 (fourteen) days. 01/14/16   Historical Provider, MD  methocarbamol (ROBAXIN) 500 MG tablet Take 1 tablet (500 mg total) by mouth 2 (two) times daily as needed for muscle spasms. 01/17/16   Noemi Chapel, MD  naproxen (NAPROSYN) 500 MG tablet Take 1 tablet (500 mg total) by mouth 2 (two) times daily with a meal. 01/17/16   Noemi Chapel, MD   BP 129/84 mmHg  Pulse 78  Temp(Src) 98.1 F (36.7 C) (Oral)  Resp 18  SpO2 95%  LMP 06/03/2012 Physical Exam  Constitutional: She appears well-developed and well-nourished. No distress.  HENT:  Head: Normocephalic and atraumatic.  Mouth/Throat: Oropharynx is clear and moist. No oropharyngeal exudate.  TM's clear bialterally - OP clear, sinuses normal appearing, no draiange  Eyes: Conjunctivae and EOM are normal. Pupils are equal, round, and reactive to light. Right eye exhibits no discharge. Left eye exhibits no discharge. No scleral icterus.  Neck: Normal range of motion. Neck supple. No JVD present. No thyromegaly present.  Cardiovascular: Normal rate,  regular rhythm, normal heart sounds and intact distal pulses.  Exam reveals no gallop and no friction rub.   No murmur heard. No carotid bruit  Pulmonary/Chest: Effort normal and breath sounds normal. No respiratory distress. She has no wheezes. She has no rales.  Musculoskeletal: Normal range of motion. She exhibits tenderness ( L sided neck ttp). She exhibits no edema.  Lymphadenopathy:    She has no cervical adenopathy.  Neurological: She is alert. Coordination normal.  Neurologic exam:  Speech clear, pupils equal round reactive to light, extraocular movements intact  Normal peripheral visual fields Cranial nerves III through XII normal including no facial droop Follows commands, moves all extremities x4, normal strength to bilateral upper and lower extremities at all major muscle groups including grip Sensation normal to light touch and pinprick Coordination intact, no limb ataxia, finger-nose-finger normal Rapid alternating movements normal No pronator drift Gait normal   Skin: Skin is warm and dry. No rash noted. No erythema.  Psychiatric: She has a normal mood and affect. Her behavior is normal.  Nursing note and vitals reviewed.   ED Course  Procedures (including critical care time) Labs Review Labs Reviewed  BASIC METABOLIC PANEL - Abnormal; Notable for the following:    Glucose, Bld 159 (*)    All other components within normal limits  CBC  URINALYSIS, ROUTINE W REFLEX MICROSCOPIC (NOT AT [facility])    Imaging Review Ct Angio Neck W/cm &/or Wo/cm  01/17/2016  CLINICAL DATA:  Left neck pain and pressure. Dizziness for 2 days. Pain in back of head to the left eye. EXAM: CT ANGIOGRAPHY NECK TECHNIQUE: Multidetector CT imaging of the neck was performed using the standard protocol during bolus administration of intravenous contrast. Multiplanar CT image reconstructions and MIPs were obtained to evaluate the vascular anatomy. Carotid stenosis measurements (when applicable) are  obtained utilizing NASCET criteria, using the distal internal carotid diameter as the denominator. CONTRAST:  50 mL Isovue 370 COMPARISON:  MRI brain 01/08/2015 and CTA head and neck 01/08/2015. FINDINGS: Aortic arch: A 3 vessel arch configuration is present. No focal narrowing is present at the origins the great vessels. Right carotid system: The right common carotid artery is normal. The right carotid bifurcation is unremarkable. The cervical right ICA is normal to the skullbase. Left carotid system: The left common carotid artery is within normal limits. The carotid bifurcation is unremarkable. The cervical left ICA is normal to the skullbase. Vertebral arteries:The vertebral arteries originate from the subclavian arteries bilaterally. The left vertebral artery is the dominant vessel. There is no significant stenosis in the neck. The PICA origins are visualized and normal. The vertebrobasilar junction is normal. Skeleton: Anterior cervical fusion at C4-5, C5-6, and C6-7 is stable. Vertebral body heights alignment are maintained. Other neck: The soft tissues the neck are otherwise unremarkable. The thyroid is within normal limits. Salivary glands are unremarkable. No focal mucosal or submucosal lesions are present. The lung apices are clear. IMPRESSION: 1. Negative CT of the neck. 2. Anterior cervical fusion C4-7. Electronically Signed   By: San Morelle M.D.   On: 01/17/2016 19:01   I have personally reviewed and evaluated these images and lab results as part of my medical decision-making.    MDM   Final diagnoses:  Neck pain  Nonintractable headache, unspecified chronicity pattern, unspecified headache type    #1 - neck pain - MSK vs cervical artery dissection - no focal neuro other than vague L sided blurred vision - VS normal  #2 - pain control - see below.  CT neg for dissection or other abnormalities - likely MSK, pt given paper copy of all results - explained findings-  She will f/u  outpatient.  Meds given in ED:  Medications  prochlorperazine (COMPAZINE) injection 10 mg (10 mg Intravenous Given 01/17/16 1751)  ketorolac (TORADOL) 30 MG/ML injection 30 mg (30 mg Intravenous Given 01/17/16 1748)  iopamidol (ISOVUE-370) 76 % injection (50 mLs  Contrast Given 01/17/16 1827)    New Prescriptions   METHOCARBAMOL (ROBAXIN) 500 MG TABLET    Take 1 tablet (500 mg total) by mouth 2 (two) times daily as needed for muscle spasms.   NAPROXEN (NAPROSYN) 500 MG TABLET    Take 1 tablet (500 mg total) by mouth 2 (two) times daily with a meal.      Noemi Chapel, MD 01/17/16 2012

## 2016-01-17 NOTE — Discharge Instructions (Signed)

## 2016-01-28 DIAGNOSIS — M064 Inflammatory polyarthropathy: Secondary | ICD-10-CM | POA: Diagnosis not present

## 2016-01-28 DIAGNOSIS — R0602 Shortness of breath: Secondary | ICD-10-CM | POA: Diagnosis not present

## 2016-01-28 DIAGNOSIS — M2550 Pain in unspecified joint: Secondary | ICD-10-CM | POA: Diagnosis not present

## 2016-01-28 DIAGNOSIS — D8689 Sarcoidosis of other sites: Secondary | ICD-10-CM | POA: Diagnosis not present

## 2016-01-28 DIAGNOSIS — R7303 Prediabetes: Secondary | ICD-10-CM | POA: Diagnosis not present

## 2016-02-04 DIAGNOSIS — J0100 Acute maxillary sinusitis, unspecified: Secondary | ICD-10-CM | POA: Diagnosis not present

## 2016-02-04 DIAGNOSIS — G43909 Migraine, unspecified, not intractable, without status migrainosus: Secondary | ICD-10-CM | POA: Diagnosis not present

## 2016-02-04 DIAGNOSIS — D869 Sarcoidosis, unspecified: Secondary | ICD-10-CM | POA: Diagnosis not present

## 2016-02-04 DIAGNOSIS — M459 Ankylosing spondylitis of unspecified sites in spine: Secondary | ICD-10-CM | POA: Diagnosis not present

## 2016-02-25 DIAGNOSIS — D8689 Sarcoidosis of other sites: Secondary | ICD-10-CM | POA: Diagnosis not present

## 2016-03-13 DIAGNOSIS — M4316 Spondylolisthesis, lumbar region: Secondary | ICD-10-CM | POA: Diagnosis not present

## 2016-03-13 DIAGNOSIS — M47812 Spondylosis without myelopathy or radiculopathy, cervical region: Secondary | ICD-10-CM | POA: Diagnosis not present

## 2016-03-13 DIAGNOSIS — M542 Cervicalgia: Secondary | ICD-10-CM | POA: Diagnosis not present

## 2016-03-13 DIAGNOSIS — M549 Dorsalgia, unspecified: Secondary | ICD-10-CM | POA: Diagnosis not present

## 2016-03-17 DIAGNOSIS — M150 Primary generalized (osteo)arthritis: Secondary | ICD-10-CM | POA: Diagnosis not present

## 2016-03-17 DIAGNOSIS — M2550 Pain in unspecified joint: Secondary | ICD-10-CM | POA: Diagnosis not present

## 2016-03-17 DIAGNOSIS — M064 Inflammatory polyarthropathy: Secondary | ICD-10-CM | POA: Diagnosis not present

## 2016-03-17 DIAGNOSIS — D8689 Sarcoidosis of other sites: Secondary | ICD-10-CM | POA: Diagnosis not present

## 2016-04-10 ENCOUNTER — Other Ambulatory Visit: Payer: Self-pay | Admitting: Pulmonary Disease

## 2016-04-28 ENCOUNTER — Ambulatory Visit (INDEPENDENT_AMBULATORY_CARE_PROVIDER_SITE_OTHER)
Admission: RE | Admit: 2016-04-28 | Discharge: 2016-04-28 | Disposition: A | Discharge status: Home / Self Care | Payer: BLUE CROSS/BLUE SHIELD | Source: Ambulatory Visit | Attending: Pulmonary Disease | Admitting: Pulmonary Disease

## 2016-04-28 ENCOUNTER — Encounter: Payer: Self-pay | Admitting: Pulmonary Disease

## 2016-04-28 ENCOUNTER — Other Ambulatory Visit (INDEPENDENT_AMBULATORY_CARE_PROVIDER_SITE_OTHER): Payer: BLUE CROSS/BLUE SHIELD

## 2016-04-28 ENCOUNTER — Ambulatory Visit (INDEPENDENT_AMBULATORY_CARE_PROVIDER_SITE_OTHER): Payer: BLUE CROSS/BLUE SHIELD | Admitting: Pulmonary Disease

## 2016-04-28 VITALS — BP 128/98 | HR 78 | Ht 66.5 in | Wt 182.4 lb

## 2016-04-28 DIAGNOSIS — J209 Acute bronchitis, unspecified: Secondary | ICD-10-CM

## 2016-04-28 DIAGNOSIS — R05 Cough: Secondary | ICD-10-CM | POA: Diagnosis not present

## 2016-04-28 DIAGNOSIS — D860 Sarcoidosis of lung: Secondary | ICD-10-CM

## 2016-04-28 DIAGNOSIS — R0602 Shortness of breath: Secondary | ICD-10-CM | POA: Diagnosis not present

## 2016-04-28 LAB — CBC WITH DIFFERENTIAL/PLATELET
BASOS ABS: 0 10*3/uL (ref 0.0–0.1)
Basophils Relative: 0.3 % (ref 0.0–3.0)
EOS ABS: 0.1 10*3/uL (ref 0.0–0.7)
Eosinophils Relative: 0.9 % (ref 0.0–5.0)
HEMATOCRIT: 37.7 % (ref 36.0–46.0)
Hemoglobin: 13 g/dL (ref 12.0–15.0)
LYMPHS ABS: 1.3 10*3/uL (ref 0.7–4.0)
LYMPHS PCT: 15.5 % (ref 12.0–46.0)
MCHC: 34.4 g/dL (ref 30.0–36.0)
MCV: 88.2 fl (ref 78.0–100.0)
MONO ABS: 0.3 10*3/uL (ref 0.1–1.0)
Monocytes Relative: 4 % (ref 3.0–12.0)
NEUTROS ABS: 6.5 10*3/uL (ref 1.4–7.7)
NEUTROS PCT: 79.3 % — AB (ref 43.0–77.0)
PLATELETS: 268 10*3/uL (ref 150.0–400.0)
RBC: 4.27 Mil/uL (ref 3.87–5.11)
RDW: 15.9 % — ABNORMAL HIGH (ref 11.5–15.5)
WBC: 8.2 10*3/uL (ref 4.0–10.5)

## 2016-04-28 LAB — COMPREHENSIVE METABOLIC PANEL
ALT: 15 U/L (ref 0–35)
AST: 16 U/L (ref 0–37)
Albumin: 4.5 g/dL (ref 3.5–5.2)
Alkaline Phosphatase: 70 U/L (ref 39–117)
BILIRUBIN TOTAL: 0.3 mg/dL (ref 0.2–1.2)
BUN: 18 mg/dL (ref 6–23)
CO2: 28 meq/L (ref 19–32)
CREATININE: 0.94 mg/dL (ref 0.40–1.20)
Calcium: 8.8 mg/dL (ref 8.4–10.5)
Chloride: 106 mEq/L (ref 96–112)
GFR: 67.13 mL/min (ref >60.00)
GLUCOSE: 112 mg/dL — AB (ref 70–99)
Potassium: 4.3 mEq/L (ref 3.5–5.1)
SODIUM: 137 meq/L (ref 135–145)
Total Protein: 6.8 g/dL (ref 6.0–8.3)

## 2016-04-28 MED ORDER — AZITHROMYCIN 250 MG PO TABS: ORAL_TABLET | ORAL | 0 refills | Status: DC

## 2016-04-28 MED ORDER — PREDNISONE 10 MG PO TABS: ORAL_TABLET | ORAL | 0 refills | Status: DC

## 2016-04-28 NOTE — Progress Notes (Signed)
Current Outpatient Prescriptions on File Prior to Visit  Medication Sig  . albuterol (PROVENTIL HFA;VENTOLIN HFA) 108 (90 BASE) MCG/ACT inhaler Inhale 2 puffs into the lungs every 6 (six) hours as needed. For shortness of breath  . buPROPion (WELLBUTRIN XL) 300 MG 24 hr tablet Take 300 mg by mouth daily. Reported on 08/20/2015  . diazepam (VALIUM) 2 MG tablet Take 2 mg by mouth at bedtime.  . fluticasone furoate-vilanterol (BREO ELLIPTA) 100-25 MCG/INH AEPB Inhale 1 puff into the lungs daily.  Marland Kitchen levothyroxine (SYNTHROID, LEVOTHROID) 137 MCG tablet Take 137 mcg by mouth every morning.   Marland Kitchen omeprazole (PRILOSEC) 40 MG capsule Take 40 mg by mouth at bedtime.  Marland Kitchen PARoxetine (PAXIL) 40 MG tablet Take 40 mg by mouth at bedtime.  . predniSONE (DELTASONE) 5 MG tablet TAKE 1 TABLET (5 MG TOTAL) BY MOUTH DAILY WITH BREAKFAST.  Marland Kitchen rOPINIRole (REQUIP) 2 MG tablet Take 2 mg by mouth at bedtime.  . traMADol (ULTRAM) 50 MG tablet Take 1 tablet (50 mg total) by mouth every 6 (six) hours as needed. (Patient taking differently: Take 50 mg by mouth every 6 (six) hours as needed for moderate pain. )  . traZODone (DESYREL) 50 MG tablet Take 50 mg by mouth at bedtime.  . valACYclovir (VALTREX) 500 MG tablet Take 500 mg by mouth at bedtime.  . vitamin B-12 (CYANOCOBALAMIN) 1000 MCG tablet Take 1,000 mcg by mouth every morning.   No current facility-administered medications on file prior to visit.     Chief Complaint  Patient presents with  . Follow-up    Pt c/o dry "hacky" cough, wheezing, SOB and chest congestion x 2 weeks. Using NyQuil for symptoms. Denies any mucus production and fever.     Events 09/20/14 Quantiferon gold >> negative 10/26/14 Mediastinoscopy >> granulomas 11/12/14 Start prednisone  Pulmonary function tests 02/05/15 >> FEV1 2.96 (94%), FEV1% 80, TLC 5.27 (95%), DLCO 80%, +BD  Imaging 09/18/14 CT head >> 12 x 6 mm Lt frontal lytic lesion 09/18/14 MRI brain >> 12 mm Lt frontal lytic  lesion 09/18/14 Bone scan >> no other lesions 09/19/14 CT chest >> Rt paratracheal LAN 1.5 cm, Rt subcarinal LAN 2 cm, Rt hilar LAN 1.4 cm, Lt hilar LAN 1.7 cm 09/19/14 CT abd/pelvis >> 1.6 cm LAN at gastrohepatic ligament, 1.3 cm LAN at portal vein, 0.9 cm LAN Rt renal artery 04/30/15 CT chest >> decreased mediastinal and hilar LAN, stable 2 mm nodule RML  Serology 09/20/14 ACE 76, ANA negative 01/01/15 ACE 26 09/03/15 ACE 60 10/01/15 ACE 19  Past medical hx GERD, IBS, RLS, Depression, Anxiety, Hypothyroidism, Interstitial cystitis, Chronic back pain, HLA B27 positive  Past surgical hx, Medications, Allergies, Family hx, Social hx all reviewed.  Vital signs Blood pressure (!) 128/98, pulse 78, height 5' 6.5" (1.689 m), weight 182 lb 6.4 oz (82.7 kg), last menstrual period 06/03/2012, SpO2 98 %.  History of Present Illness: [name] is a 50 y.o. female with systemic sarcoidosis and asthma.  She was started on MTX in June by rheumatology.  She developed a cough about 2 weeks ago.  She has been getting chills.  She has wheezing and feels tight in her chest.  She is not bringing up much sputum.  She has been getting loose stools.  She denies skin rash.  She thinks her symptoms started in her sinuses and then settled into her chest.  She increased her prednisone to 10 mg >> this has marginal benefit.  Breo not working  as well as before.  She has to use albuterol several times per day >> helps, but doesn't seem like it is lasting as long.  She is wondering whether she should apply for disability.    Physical Exam:  General - appears fatigued, frequent coughing spells ENT - no sinus tenderness, no oral exudate, no LAN Cardiac - s1s2 regular, no murmur Chest - coarse breath sounds b/l, no wheeze Back - No focal tenderness Abd - Soft, non-tender Ext - No edema Neuro - Normal strength Skin - improved appearance of Lt arm Psych - normal mood, and behavior   Assessment/Plan:  Acute  bronchitis. - will give course of zithromax and prednisone - will get CXR and labs today  Systemic sarcoidosis with polyarthritis and HLA B27 positive - MTX, prednisone per rheumatology - advised her to d/w rheumatology about whether she needs to be on PCP prophylaxis while on MTX  Asthma - could be part of sarcoidosis involvement of airways - continue breo and prn ventolin - advised that she could get flu shot once she has recovered from current acute bronchitis  Dermatitis left arm. - Uncertain whether this is related to sarcoid.  She has been seen by dermatology.  Improving. - she is to follow up with dermatology if lesions get worse   Patient Instructions  Chest xray and lab tests today  Zithromax 250 mg pill >> 2 pills on day 1, then 1 pill daily for next 4 days  Prednisone 10 mg pill >> 4 pills daily for 2 days, 3 pills daily for 2 days, 2 pills daily for 2 days, 1 pill daily for 2 days  Follow up with Dr. Halford Chessman or Nurse Practitioner in 1 week    Chesley Mires, MD [facility] Pager:  [phone] 04/28/2016, 4:50 PM

## 2016-04-28 NOTE — Patient Instructions (Signed)
Chest xray and lab tests today  Zithromax 250 mg pill >> 2 pills on day 1, then 1 pill daily for next 4 days  Prednisone 10 mg pill >> 4 pills daily for 2 days, 3 pills daily for 2 days, 2 pills daily for 2 days, 1 pill daily for 2 days  Follow up with Dr. Halford Chessman or Nurse Practitioner in 1 week

## 2016-04-29 ENCOUNTER — Telehealth: Payer: Self-pay | Admitting: Pulmonary Disease

## 2016-04-29 NOTE — Telephone Encounter (Signed)
Dg Chest 2 View  Result Date: 04/29/2016 CLINICAL DATA:  Cough and congestion with chest tightness and shortness of breath for 2 weeks EXAM: CHEST  2 VIEW COMPARISON:  September 13, 2015 FINDINGS: Lungs are clear. Heart size and pulmonary vascularity are normal. No adenopathy. No pneumothorax. No bone lesions. There is postoperative change in the lower cervical spine. IMPRESSION: No edema or consolidation. Electronically Signed   By: Lowella Grip III M.D.   On: 04/29/2016 07:40    CMP Latest Ref Rng & Units 04/28/2016 01/17/2016 09/13/2015  Glucose 70 - 99 mg/dL 112(H) 159(H) 136(H)  BUN 6 - 23 mg/dL 18 17 21(H)  Creatinine 0.40 - 1.20 mg/dL 0.94 0.87 0.92  Sodium 135 - 145 mEq/L 137 139 137  Potassium 3.5 - 5.1 mEq/L 4.3 4.2 4.5  Chloride 96 - 112 mEq/L 106 107 103  CO2 19 - 32 mEq/L 28 23 22   Calcium 8.4 - 10.5 mg/dL 8.8 9.4 9.3  Total Protein 6.0 - 8.3 g/dL 6.8 - -  Total Bilirubin 0.2 - 1.2 mg/dL 0.3 - -  Alkaline Phos 39 - 117 U/L 70 - -  AST 0 - 37 U/L 16 - -  ALT 0 - 35 U/L 15 - -    CBC Latest Ref Rng & Units 04/28/2016 01/17/2016 09/13/2015  WBC 4.0 - 10.5 K/uL 8.2 7.1 12.6(H)  Hemoglobin 12.0 - 15.0 g/dL 13.0 12.9 13.9  Hematocrit 36.0 - 46.0 % 37.7 38.8 42.7  Platelets 150.0 - 400.0 K/uL 268.0 236 310     Will have my nurse inform pt that CXR and labs were normal.

## 2016-05-01 NOTE — Telephone Encounter (Signed)
Results have been explained to patient, pt expressed understanding. Nothing further needed.  

## 2016-05-05 ENCOUNTER — Ambulatory Visit: Payer: BLUE CROSS/BLUE SHIELD | Admitting: Adult Health

## 2016-05-12 DIAGNOSIS — Z1589 Genetic susceptibility to other disease: Secondary | ICD-10-CM | POA: Diagnosis not present

## 2016-05-12 DIAGNOSIS — M064 Inflammatory polyarthropathy: Secondary | ICD-10-CM | POA: Diagnosis not present

## 2016-05-12 DIAGNOSIS — D8689 Sarcoidosis of other sites: Secondary | ICD-10-CM | POA: Diagnosis not present

## 2016-05-12 DIAGNOSIS — M150 Primary generalized (osteo)arthritis: Secondary | ICD-10-CM | POA: Diagnosis not present

## 2016-05-31 DIAGNOSIS — M79672 Pain in left foot: Secondary | ICD-10-CM | POA: Diagnosis not present

## 2016-06-01 ENCOUNTER — Telehealth: Payer: Self-pay | Admitting: Pulmonary Disease

## 2016-06-01 NOTE — Telephone Encounter (Signed)
VS is on nights this week.  Spoke with Dr Melina Copa and advised. He said a call in the next few days would be fine.  VS - Please call ASAP. Thanks!

## 2016-06-04 NOTE — Telephone Encounter (Signed)
Dr Melina Copa calling again to speak to VS on this pt says the issue is fuctionality, he can be reached @ [phone] waiting to speak to him ASAP

## 2016-06-04 NOTE — Telephone Encounter (Signed)
Spoke with Dr. Melina Copa, states that he needs to speak with VS only regarding this patient and her functionality for work.  I offered to forward message to DOD as VS is on night float, but he declined stating that he needed to speak with VS only.   Dr. Melina Copa states he can be reached after 5:00 tomorrow morning our time (he's in central time) at below listed number.    VS please advise.  Thanks!

## 2016-06-10 NOTE — Telephone Encounter (Signed)
VS please advise if you spoke with Dr. Melina Copa and if ok to close this message. thanks

## 2016-06-11 NOTE — Telephone Encounter (Signed)
We need to confirm who Dr. Melina Copa is, and whether Dr. Melina Copa is authorized to receive information regarding [name].  I have not received any documentation indicating that Dr. Melina Copa is authorized to receive medical information regarding Ms. Stripling.

## 2016-06-11 NOTE — Telephone Encounter (Signed)
Dr. Gerlene Burdock - Provider with [employer] for appeals on Long term disability. Patient states okay to speak to Dr. Melina Copa.   Patient states she did go back to work but had to come home early today because she is very weak, spine hurts, SOB, fatigue.  Going to see spine specialist tomorrow (Dr. Marily Lente)  Patient scheduled an appointment to see Dr. Halford Chessman on 07/29/16 for her follow up appointment but says that she will call back if she is not feeling any better to get sooner appointment.

## 2016-06-11 NOTE — Telephone Encounter (Signed)
Called and left message for patient to call back to discuss Dr. Melina Copa.   Awaiting call back from Pt.

## 2016-06-11 NOTE — Telephone Encounter (Signed)
Calling about her disabilty paperwork she states its ok to send   [phone]   [phone]

## 2016-06-12 DIAGNOSIS — M5412 Radiculopathy, cervical region: Secondary | ICD-10-CM | POA: Diagnosis not present

## 2016-07-08 ENCOUNTER — Other Ambulatory Visit: Payer: Self-pay | Admitting: Orthopedic Surgery

## 2016-07-08 DIAGNOSIS — M5412 Radiculopathy, cervical region: Secondary | ICD-10-CM

## 2016-07-08 DIAGNOSIS — M545 Low back pain: Secondary | ICD-10-CM

## 2016-07-08 DIAGNOSIS — M546 Pain in thoracic spine: Secondary | ICD-10-CM

## 2016-07-20 ENCOUNTER — Ambulatory Visit
Admission: RE | Admit: 2016-07-20 | Discharge: 2016-07-20 | Disposition: A | Discharge status: Home / Self Care | Payer: BLUE CROSS/BLUE SHIELD | Source: Ambulatory Visit | Attending: Orthopedic Surgery | Admitting: Orthopedic Surgery

## 2016-07-20 DIAGNOSIS — M546 Pain in thoracic spine: Secondary | ICD-10-CM

## 2016-07-20 DIAGNOSIS — M545 Low back pain: Secondary | ICD-10-CM

## 2016-07-20 DIAGNOSIS — M5124 Other intervertebral disc displacement, thoracic region: Secondary | ICD-10-CM | POA: Diagnosis not present

## 2016-07-20 DIAGNOSIS — M5412 Radiculopathy, cervical region: Secondary | ICD-10-CM

## 2016-07-20 DIAGNOSIS — M4802 Spinal stenosis, cervical region: Secondary | ICD-10-CM | POA: Diagnosis not present

## 2016-07-20 DIAGNOSIS — M5126 Other intervertebral disc displacement, lumbar region: Secondary | ICD-10-CM | POA: Diagnosis not present

## 2016-07-27 DIAGNOSIS — M545 Low back pain: Secondary | ICD-10-CM | POA: Diagnosis not present

## 2016-07-27 DIAGNOSIS — M542 Cervicalgia: Secondary | ICD-10-CM | POA: Diagnosis not present

## 2016-07-28 DIAGNOSIS — E669 Obesity, unspecified: Secondary | ICD-10-CM | POA: Diagnosis not present

## 2016-07-28 DIAGNOSIS — Z6830 Body mass index (BMI) 30.0-30.9, adult: Secondary | ICD-10-CM | POA: Diagnosis not present

## 2016-07-28 DIAGNOSIS — M150 Primary generalized (osteo)arthritis: Secondary | ICD-10-CM | POA: Diagnosis not present

## 2016-07-28 DIAGNOSIS — D8689 Sarcoidosis of other sites: Secondary | ICD-10-CM | POA: Diagnosis not present

## 2016-07-29 ENCOUNTER — Ambulatory Visit (INDEPENDENT_AMBULATORY_CARE_PROVIDER_SITE_OTHER): Payer: BLUE CROSS/BLUE SHIELD | Admitting: Pulmonary Disease

## 2016-07-29 ENCOUNTER — Encounter: Payer: Self-pay | Admitting: Pulmonary Disease

## 2016-07-29 VITALS — BP 122/86 | HR 74 | Ht 66.0 in | Wt 187.4 lb

## 2016-07-29 DIAGNOSIS — D860 Sarcoidosis of lung: Secondary | ICD-10-CM

## 2016-07-29 DIAGNOSIS — J4530 Mild persistent asthma, uncomplicated: Secondary | ICD-10-CM | POA: Diagnosis not present

## 2016-07-29 NOTE — Patient Instructions (Signed)
Follow up in 6 months 

## 2016-07-29 NOTE — Progress Notes (Signed)
Current Outpatient Prescriptions on File Prior to Visit  Medication Sig  . albuterol (PROVENTIL HFA;VENTOLIN HFA) 108 (90 BASE) MCG/ACT inhaler Inhale 2 puffs into the lungs every 6 (six) hours as needed. For shortness of breath  . buPROPion (WELLBUTRIN XL) 300 MG 24 hr tablet Take 300 mg by mouth daily. Reported on 08/20/2015  . diazepam (VALIUM) 2 MG tablet Take 2 mg by mouth at bedtime.  . fluticasone furoate-vilanterol (BREO ELLIPTA) 100-25 MCG/INH AEPB Inhale 1 puff into the lungs daily.  Marland Kitchen levothyroxine (SYNTHROID, LEVOTHROID) 137 MCG tablet Take 137 mcg by mouth every morning.   . methotrexate (50 MG/ML) 1 g injection Take 0.6 mL weekly SQ  . omeprazole (PRILOSEC) 40 MG capsule Take 40 mg by mouth at bedtime.  Marland Kitchen PARoxetine (PAXIL) 40 MG tablet Take 40 mg by mouth at bedtime.  . predniSONE (DELTASONE) 5 MG tablet TAKE 1 TABLET (5 MG TOTAL) BY MOUTH DAILY WITH BREAKFAST.  Marland Kitchen rOPINIRole (REQUIP) 2 MG tablet Take 2 mg by mouth at bedtime.  . traMADol (ULTRAM) 50 MG tablet Take 1 tablet (50 mg total) by mouth every 6 (six) hours as needed. (Patient taking differently: Take 50 mg by mouth every 6 (six) hours as needed for moderate pain. )  . traZODone (DESYREL) 50 MG tablet Take 50 mg by mouth at bedtime.  . valACYclovir (VALTREX) 500 MG tablet Take 500 mg by mouth at bedtime.   No current facility-administered medications on file prior to visit.     Chief Complaint  Patient presents with  . Follow-up    Pt states that breathing has been doing okay since last OV. Pt has recent MRI spine and was told that her lymph nodes in the mediastinal region were prominent. Pt states that she has been having quite a bit of chest pain/discomfort.     Events 09/20/14 Quantiferon gold >> negative 10/26/14 Mediastinoscopy >> granulomas 11/12/14 Start prednisone  Pulmonary function tests 02/05/15 >> FEV1 2.96 (94%), FEV1% 80, TLC 5.27 (95%), DLCO 80%, +BD  Imaging 09/18/14 CT head >> 12 x 6 mm Lt frontal  lytic lesion 09/18/14 MRI brain >> 12 mm Lt frontal lytic lesion 09/18/14 Bone scan >> no other lesions 09/19/14 CT chest >> Rt paratracheal LAN 1.5 cm, Rt subcarinal LAN 2 cm, Rt hilar LAN 1.4 cm, Lt hilar LAN 1.7 cm 09/19/14 CT abd/pelvis >> 1.6 cm LAN at gastrohepatic ligament, 1.3 cm LAN at portal vein, 0.9 cm LAN Rt renal artery 04/30/15 CT chest >> decreased mediastinal and hilar LAN, stable 2 mm nodule RML  Serology 09/20/14 ACE 76, ANA negative 01/01/15 ACE 26 09/03/15 ACE 60 10/01/15 ACE 19  Past medical hx GERD, IBS, RLS, Depression, Anxiety, Hypothyroidism, Interstitial cystitis, Chronic back pain, HLA B27 positive  Past surgical hx, Medications, Allergies, Family hx, Social hx all reviewed.  Vital signs Blood pressure 122/86, pulse 74, height '5\' 6"'$  (1.676 m), weight 187 lb 6.4 oz (85 kg), last menstrual period 06/03/2012, SpO2 99 %.  History of Present Illness: [name] is a 50 y.o. female with systemic sarcoidosis and asthma.  She had MRI spine recently.  This showed mild enlargement of mediastinal nodes.  She gets occasional chest tightness.  She is not having cough, wheeze, or sputum.  Her arm rash is much better.  She is not using albuterol much, and she is not sure breo is helping much anymore.  Physical Exam:  General - pleasant ENT - no sinus tenderness, no oral exudate, no LAN Cardiac -  s1s2 regular, no murmur Chest - no wheeze/rales Back - No focal tenderness Abd - Soft, non-tender Ext - No edema Neuro - Normal strength Skin - no rashes Psych - normal mood, and behavior   Assessment/Plan:  Systemic sarcoidosis with polyarthritis and HLA B27 positive - MTX, prednisone per rheumatology (Dr. Lenna Gilford) - advised her to d/w rheumatology about whether she needs to be on PCP prophylaxis while on MTX  Asthma - could be part of sarcoidosis involvement of airways - she can try stopping breo, and continue prn albuterol   Patient Instructions  Follow up in 6  months    Chesley Mires, MD [facility] Pager:  [phone] 07/29/2016, 4:18 PM

## 2016-08-05 DIAGNOSIS — D869 Sarcoidosis, unspecified: Secondary | ICD-10-CM | POA: Diagnosis not present

## 2016-08-05 DIAGNOSIS — M459 Ankylosing spondylitis of unspecified sites in spine: Secondary | ICD-10-CM | POA: Diagnosis not present

## 2016-08-05 DIAGNOSIS — G894 Chronic pain syndrome: Secondary | ICD-10-CM | POA: Diagnosis not present

## 2016-08-05 DIAGNOSIS — M4726 Other spondylosis with radiculopathy, lumbar region: Secondary | ICD-10-CM | POA: Diagnosis not present

## 2016-08-11 ENCOUNTER — Other Ambulatory Visit: Payer: Self-pay | Admitting: Pulmonary Disease

## 2016-08-17 ENCOUNTER — Other Ambulatory Visit: Payer: Self-pay

## 2016-08-17 ENCOUNTER — Telehealth: Payer: Self-pay | Admitting: Pulmonary Disease

## 2016-08-17 MED ORDER — PREDNISONE 5 MG PO TABS: 5.0000 mg | ORAL_TABLET | Freq: Every day | ORAL | 2 refills | Status: DC

## 2016-08-17 NOTE — Telephone Encounter (Signed)
Rx sent in

## 2016-08-18 ENCOUNTER — Other Ambulatory Visit: Payer: Self-pay | Admitting: Pulmonary Disease

## 2016-09-02 DIAGNOSIS — G894 Chronic pain syndrome: Secondary | ICD-10-CM | POA: Diagnosis not present

## 2016-09-04 DIAGNOSIS — D8689 Sarcoidosis of other sites: Secondary | ICD-10-CM | POA: Diagnosis not present

## 2016-09-29 DIAGNOSIS — M150 Primary generalized (osteo)arthritis: Secondary | ICD-10-CM | POA: Diagnosis not present

## 2016-09-29 DIAGNOSIS — D8689 Sarcoidosis of other sites: Secondary | ICD-10-CM | POA: Diagnosis not present

## 2016-09-29 DIAGNOSIS — Z1589 Genetic susceptibility to other disease: Secondary | ICD-10-CM | POA: Diagnosis not present

## 2016-09-29 DIAGNOSIS — M2550 Pain in unspecified joint: Secondary | ICD-10-CM | POA: Diagnosis not present

## 2016-10-20 ENCOUNTER — Emergency Department (HOSPITAL_COMMUNITY): Payer: BLUE CROSS/BLUE SHIELD

## 2016-10-20 ENCOUNTER — Encounter (HOSPITAL_COMMUNITY): Payer: Self-pay | Admitting: *Deleted

## 2016-10-20 ENCOUNTER — Emergency Department (HOSPITAL_COMMUNITY)
Admission: EM | Admit: 2016-10-20 | Discharge: 2016-10-21 | Disposition: A | Discharge status: Home / Self Care | Payer: BLUE CROSS/BLUE SHIELD | Attending: Physician Assistant | Admitting: Physician Assistant

## 2016-10-20 DIAGNOSIS — Z8709 Personal history of other diseases of the respiratory system: Secondary | ICD-10-CM | POA: Diagnosis not present

## 2016-10-20 DIAGNOSIS — Z87891 Personal history of nicotine dependence: Secondary | ICD-10-CM | POA: Insufficient documentation

## 2016-10-20 DIAGNOSIS — J45909 Unspecified asthma, uncomplicated: Secondary | ICD-10-CM | POA: Diagnosis not present

## 2016-10-20 DIAGNOSIS — R0789 Other chest pain: Secondary | ICD-10-CM

## 2016-10-20 DIAGNOSIS — Z79899 Other long term (current) drug therapy: Secondary | ICD-10-CM | POA: Insufficient documentation

## 2016-10-20 DIAGNOSIS — E039 Hypothyroidism, unspecified: Secondary | ICD-10-CM | POA: Insufficient documentation

## 2016-10-20 DIAGNOSIS — R0602 Shortness of breath: Secondary | ICD-10-CM | POA: Diagnosis not present

## 2016-10-20 DIAGNOSIS — Z862 Personal history of diseases of the blood and blood-forming organs and certain disorders involving the immune mechanism: Secondary | ICD-10-CM

## 2016-10-20 DIAGNOSIS — R079 Chest pain, unspecified: Secondary | ICD-10-CM | POA: Diagnosis not present

## 2016-10-20 LAB — BASIC METABOLIC PANEL
Anion gap: 10 (ref 5–15)
BUN: 14 mg/dL (ref 6–20)
CO2: 22 mmol/L (ref 22–32)
CREATININE: 0.98 mg/dL (ref 0.44–1.00)
Calcium: 9.3 mg/dL (ref 8.9–10.3)
Chloride: 106 mmol/L (ref 101–111)
GFR calc Af Amer: >60 mL/min (ref >60)
Glucose, Bld: 102 mg/dL — ABNORMAL HIGH (ref 65–99)
POTASSIUM: 4.3 mmol/L (ref 3.5–5.1)
SODIUM: 138 mmol/L (ref 135–145)

## 2016-10-20 LAB — CBC
HCT: 37.7 % (ref 36.0–46.0)
Hemoglobin: 12.8 g/dL (ref 12.0–15.0)
MCH: 30.6 pg (ref 26.0–34.0)
MCHC: 34 g/dL (ref 30.0–36.0)
MCV: 90.2 fL (ref 78.0–100.0)
PLATELETS: 249 10*3/uL (ref 150–400)
RBC: 4.18 MIL/uL (ref 3.87–5.11)
RDW: 15.9 % — AB (ref 11.5–15.5)
WBC: 8.3 10*3/uL (ref 4.0–10.5)

## 2016-10-20 LAB — I-STAT TROPONIN, ED
TROPONIN I, POC: 0 ng/mL (ref 0.00–0.08)
TROPONIN I, POC: 0 ng/mL (ref 0.00–0.08)

## 2016-10-20 MED ORDER — HYDROMORPHONE HCL 2 MG/ML IJ SOLN
1.0000 mg | Freq: Once | INTRAMUSCULAR | Status: AC
  Administered 2016-10-20: 1 mg via INTRAVENOUS
  Filled 2016-10-20: qty 1

## 2016-10-20 MED ORDER — ONDANSETRON HCL 4 MG/2ML IJ SOLN
4.0000 mg | Freq: Once | INTRAMUSCULAR | Status: AC
  Administered 2016-10-20: 4 mg via INTRAVENOUS
  Filled 2016-10-20: qty 2

## 2016-10-20 MED ORDER — SODIUM CHLORIDE 0.9 % IV BOLUS (SEPSIS)
500.0000 mL | Freq: Once | INTRAVENOUS | Status: AC
  Administered 2016-10-20: 500 mL via INTRAVENOUS

## 2016-10-20 NOTE — ED Provider Notes (Signed)
[facility] DEPT Provider Note   CSN: [id number] Arrival date & time: 10/20/16  1815     History   Chief Complaint Chief Complaint  Patient presents with  . Chest Pain    HPI TAIMI TOWE is a 51 y.o. female.  Patient presents with left sided chest pain that radiates into the left jaw. Pain started earlier today and has been a waxing and waning pain throughout the day. She has experienced diaphoresis and SOB. No recent cough or fever. She reports same symptoms last week while in [place] visiting family. At that time she was hospitalized and underwent cardiac catheterization, which she reports showed a 20-30% stenosis only. No stenting or history of CAD. She has a history of sarcoidosis that has caused pain and swelling in the left chest in the past but was non-radiating. She is taking oral Dilaudid 4 mg at home for chronic pain and this has provided some relief.    The history is provided by the patient. No language interpreter was used.  Chest Pain   Associated symptoms include nausea and shortness of breath. Pertinent negatives include no abdominal pain, no fever, no vomiting and no weakness.    Past Medical History:  Diagnosis Date  . Anxiety   . Arthritis    "back, hands, neck" (09/18/2014)  . Asthma   . Chronic back pain    "whole spine"  . Chronic bronchitis ([place])    "get it close to q yr" (09/18/2014)  . Depression   . Environmental allergies   . Erosion of suburethral sling ([place])   . Frequency of urination   . GERD (gastroesophageal reflux disease)   . Headache    "at least several times/wk" (09/18/2014)  . Heart murmur    "when I was a baby"  . Herpes simplex type 2 infection   . HLA B27 positive   . Hypothyroidism   . IBS (irritable bowel syndrome)   . IC (interstitial cystitis)   . Migraine    "maybe once/month" (09/18/2014)  . Nocturia   . NSVD (normal spontaneous vaginal delivery)    x3  . RLS (restless legs syndrome)   . Sarcoid ([place])   .  Seizures ([place])    "when I was a teenager"  . Shortness of breath dyspnea    Exertion  . SUI (stress urinary incontinence, female)     Patient Active Problem List   Diagnosis Date Noted  . Headache 11/14/2014  . Sarcoidosis ([place]) 11/12/2014  . Left-sided weakness   . Left hemiparesis ([place]) 09/18/2014  . Weakness 09/18/2014  . Frontal skull lesion 09/18/2014  . Chest pain 09/18/2014  . AKI (acute kidney injury) ([place]) 09/18/2014  . Extrinsic asthma 04/25/2013  . Weight gain 01/12/2013  . Erosion of graft 11/16/2012    Past Surgical History:  Procedure Laterality Date  . ABDOMINAL HYSTERECTOMY  2006  . ANTERIOR CERVICAL DECOMP/DISCECTOMY FUSION  2011  . APPENDECTOMY  1985  . BACK SURGERY  2008   X 2 in 2010  . CESAREAN SECTION  2000  . CRANIECTOMY FOR DEPRESSED SKULL FRACTURE Left 12/05/2014   Procedure: Left frontal stereotactic craniectomy for biopsy of skull lesion;  Surgeon: Consuella Lose, MD;  Location: [facility];  Service: Neurosurgery;  Laterality: Left;  Left frontal stereotactic craniectomy for biopsy of skull lesion  . CYSTO WITH HYDRODISTENSION N/A 11/30/2012   Procedure: CYSTOSCOPY/HYDRODISTENSION;  Surgeon: Bernestine Amass, MD;  Location: [facility];  Service: Urology;  Laterality: N/A;  .  KNEE ARTHROSCOPY Right   . LUMBAR DISC SURGERY  02-02-2007   RIGHT SIDE L5 -- S1  . LUMBAR FUSION  12-04-2008   L4  -- S1  . LYNX RETROPUBIC SUBURETHRAL SLING  03-02-2007  . MEDIASTINOSCOPY N/A 10/26/2014   Procedure: MEDIASTINOSCOPY;  Surgeon: Melrose Nakayama, MD;  Location: [facility];  Service: Thoracic;  Laterality: N/A;  . PELVIC LAPAROSCOPY  1990's   LYSIS ADHESIONS  . PUBOVAGINAL SLING N/A 11/30/2012   Procedure: Gaynelle Arabian;  Surgeon: Bernestine Amass, MD;  Location: [facility];  Service: Urology;  Laterality: N/A;  1 HR EXAM UNDER ANESTHESIA, EXCISION OF SUB URETHRAL MESH, CYSTO, HOD   . TONSILLECTOMY  1986  . TUBAL LIGATION   2001   hulka clip  . VIDEO BRONCHOSCOPY WITH ENDOBRONCHIAL ULTRASOUND N/A 09/21/2014   Procedure: VIDEO BRONCHOSCOPY WITH ENDOBRONCHIAL ULTRASOUND;  Surgeon: Collene Gobble, MD;  [facility];  Service: Thoracic;  Laterality: N/A;    OB History    Gravida Para Term Preterm AB Living   '4 4       4   '$ SAB TAB Ectopic Multiple Live Births                   Home Medications    Prior to Admission medications   Medication Sig Start Date End Date Taking? Authorizing Provider  albuterol (PROVENTIL HFA;VENTOLIN HFA) 108 (90 BASE) MCG/ACT inhaler Inhale 2 puffs into the lungs every 6 (six) hours as needed. For shortness of breath 04/25/13  Yes Elayne Snare, MD  BREO ELLIPTA 100-25 MCG/INH AEPB INHALE 1 PUFF INTO THE LUNGS DAILY. 08/20/16  Yes Chesley Mires, MD  buPROPion (WELLBUTRIN XL) 300 MG 24 hr tablet Take 300 mg by mouth daily. Reported on 08/20/2015 01/15/15  Yes Historical Provider, MD  diazepam (VALIUM) 2 MG tablet Take 2 mg by mouth at bedtime.   Yes Historical Provider, MD  levothyroxine (SYNTHROID, LEVOTHROID) 137 MCG tablet Take 137 mcg by mouth every morning.    Yes Historical Provider, MD  methotrexate (50 MG/ML) 1 g injection Inject 30 mg into the vein once a week. Take 0.6 mL weekly SQ   Yes Historical Provider, MD  naproxen (NAPROSYN) 500 MG tablet Take 500 mg by mouth 2 (two) times daily as needed for mild pain.    Yes Historical Provider, MD  omeprazole (PRILOSEC) 40 MG capsule Take 40 mg by mouth at bedtime.   Yes Historical Provider, MD  PARoxetine (PAXIL) 40 MG tablet Take 40 mg by mouth at bedtime. 11/29/15  Yes Historical Provider, MD  predniSONE (DELTASONE) 5 MG tablet Take 1 tablet (5 mg total) by mouth daily with breakfast. 08/17/16  Yes Chesley Mires, MD  propranolol (INDERAL) 20 MG tablet Take 20 mg by mouth 2 (two) times daily.   Yes Historical Provider, MD  rOPINIRole (REQUIP) 2 MG tablet Take 2 mg by mouth at bedtime.   Yes Historical Provider, MD  traMADol (ULTRAM) 50  MG tablet Take 1 tablet (50 mg total) by mouth every 6 (six) hours as needed. Patient taking differently: Take 50 mg by mouth every 6 (six) hours as needed for moderate pain.  01/08/15  Yes Hollace Kinnier Sofia, PA-C  traZODone (DESYREL) 50 MG tablet Take 50 mg by mouth at bedtime.   Yes Historical Provider, MD  valACYclovir (VALTREX) 500 MG tablet Take 500 mg by mouth at bedtime.   Yes Historical Provider, MD    Family History Family History  Problem Relation  Age of Onset  . Diabetes Mother   . Hypertension Mother   . Heart disease Mother   . Cancer Father     LUNG AND BRAIN  . Hypertension Father   . Heart disease Maternal Grandmother   . Cancer Maternal Grandfather     LUNG  . Heart disease Paternal Grandmother   . Cancer Paternal Grandfather     Social History Social History  Substance Use Topics  . Smoking status: Former Smoker    Packs/day: 0.50    Years: 15.00    Types: Cigarettes    Quit date: 01/15/2014  . Smokeless tobacco: Never Used  . Alcohol use 0.0 oz/week     Comment: ocassional     Allergies   Bactrim [sulfamethoxazole-trimethoprim]; Percocet [oxycodone-acetaminophen]; and Vicodin [hydrocodone-acetaminophen]   Review of Systems Review of Systems  Constitutional: Negative for chills and fever.  HENT: Negative for facial swelling.        C/o jaw pain  Respiratory: Positive for shortness of breath.   Cardiovascular: Positive for chest pain.  Gastrointestinal: Positive for nausea. Negative for abdominal pain and vomiting.  Genitourinary: Negative.   Musculoskeletal: Negative.   Neurological: Negative.  Negative for syncope and weakness.     Physical Exam Updated Vital Signs BP 162/95 (BP Location: Right Arm)   Pulse 66   Temp 98.2 F (36.8 C) (Oral)   Resp 15   LMP 06/03/2012   SpO2 97%   Physical Exam  Constitutional: She is oriented to person, place, and time. She appears well-developed and well-nourished.  HENT:  Head: Normocephalic.  Left  jaw and left lateral jaw tenderness.   Neck: Normal range of motion. Neck supple.  Cardiovascular: Normal rate and regular rhythm.   No murmur heard. Pulmonary/Chest: Effort normal and breath sounds normal. She has no wheezes. She has no rales. She exhibits tenderness.  Abdominal: Soft. Bowel sounds are normal. There is no tenderness. There is no rebound and no guarding.  Musculoskeletal: Normal range of motion.  Neurological: She is alert and oriented to person, place, and time.  Skin: Skin is warm and dry. No rash noted.  Psychiatric: She has a normal mood and affect.     ED Treatments / Results  Labs (all labs ordered are listed, but only abnormal results are displayed) Labs Reviewed  BASIC METABOLIC PANEL - Abnormal; Notable for the following:       Result Value   Glucose, Bld 102 (*)    All other components within normal limits  CBC - Abnormal; Notable for the following:    RDW 15.9 (*)    All other components within normal limits  I-STAT TROPOININ, ED  I-STAT TROPOININ, ED   Results for orders placed or performed during the hospital encounter of 83/41/96  Basic metabolic panel  Result Value Ref Range   Sodium 138 135 - 145 mmol/L   Potassium 4.3 3.5 - 5.1 mmol/L   Chloride 106 101 - 111 mmol/L   CO2 22 22 - 32 mmol/L   Glucose, Bld 102 (H) 65 - 99 mg/dL   BUN 14 6 - 20 mg/dL   Creatinine, Ser 0.98 0.44 - 1.00 mg/dL   Calcium 9.3 8.9 - 10.3 mg/dL   GFR calc non Af Amer >60 >60 mL/min   GFR calc Af Amer >60 >60 mL/min   Anion gap 10 5 - 15  CBC  Result Value Ref Range   WBC 8.3 4.0 - 10.5 K/uL   RBC 4.18 3.87 - 5.11  MIL/uL   Hemoglobin 12.8 12.0 - 15.0 g/dL   HCT 37.7 36.0 - 46.0 %   MCV 90.2 78.0 - 100.0 fL   MCH 30.6 26.0 - 34.0 pg   MCHC 34.0 30.0 - 36.0 g/dL   RDW 15.9 (H) 11.5 - 15.5 %   Platelets 249 150 - 400 K/uL  I-stat troponin, ED  Result Value Ref Range   Troponin i, poc 0.00 0.00 - 0.08 ng/mL   Comment 3          I-Stat Troponin, ED (not at  [facility])  Result Value Ref Range   Troponin i, poc 0.00 0.00 - 0.08 ng/mL   Comment 3            EKG  EKG Interpretation  Date/Time:  Tuesday October 20 2016 18:17:58 EST Ventricular Rate:  78 PR Interval:  142 QRS Duration: 82 QT Interval:  378 QTC Calculation: 430 R Axis:   34 Text Interpretation:  Normal sinus rhythm Normal ECG Normal sinus rhythm Confirmed by Gerald Leitz ([id number]) on 10/20/2016 10:32:08 PM       Radiology Dg Chest 2 View  Result Date: 10/20/2016 CLINICAL DATA:  Chest pain, shortness of breath. Pain down left arm. EXAM: CHEST  2 VIEW COMPARISON:  None. FINDINGS: Cardiomediastinal silhouette is normal. Mediastinal contours appear intact. There is no evidence of focal airspace consolidation, pleural effusion or pneumothorax. Osseous structures are without acute abnormality. Anterior spinal fusion seen. Soft tissues are grossly normal. IMPRESSION: No active cardiopulmonary disease. Electronically Signed   By: Fidela Salisbury M.D.   On: 10/20/2016 18:43    Procedures Procedures (including critical care time)  Medications Ordered in ED Medications  sodium chloride 0.9 % bolus 500 mL (500 mLs Intravenous New Bag/Given 10/20/16 2315)  HYDROmorphone (DILAUDID) injection 1 mg (1 mg Intravenous Given 10/20/16 2316)  ondansetron (ZOFRAN) injection 4 mg (4 mg Intravenous Given 10/20/16 2316)     Initial Impression / Assessment and Plan / ED Course  I have reviewed the triage vital signs and the nursing notes.  Pertinent labs & imaging results that were available during my care of the patient were reviewed by me and considered in my medical decision making (see chart for details).     Patient presents with left chest pain with radiation to jaw, SOB x 1 day. History of same x 1 week ago, with negative cardiac evaluation. Today she has a non-ischemic EKG, negative troponin x 2. The patient reports a recent cardiac cath that supported that pain was not due to cardiac  ischemia. Suspect pain related to sarcoidosis. She will check with her rheumatologist regarding any change in prednisone or methotrexate.   Final Clinical Impressions(s) / ED Diagnoses   Final diagnoses:  None   1. Chest wall pain 2. H/o sarcoidosis.  New Prescriptions New Prescriptions   No medications on file     Charlann Lange, PA-C 10/21/16 0002    Courteney Lyn Mackuen, MD 10/22/16 1316

## 2016-10-20 NOTE — ED Triage Notes (Addendum)
Pt reports mid chest pain, radiates into her jaw and mid back. Having diaphoresis,sob, nausea and diarrhea. Had similar pain last week while out of town and pt had normal EKG and +troponin. Skin clammy on arrival, ekg done.

## 2016-10-20 NOTE — ED Notes (Signed)
Hx of sarcoidosis which she states gives her incr'd swelling & palpable pain to L chest. Same noted upon assessment.

## 2016-10-21 MED ORDER — HYDROMORPHONE HCL 2 MG/ML IJ SOLN
1.0000 mg | Freq: Once | INTRAMUSCULAR | Status: AC
  Administered 2016-10-21: 1 mg via INTRAVENOUS
  Filled 2016-10-21: qty 1

## 2016-10-21 MED ORDER — LORAZEPAM 1 MG PO TABS: 1.0000 mg | ORAL_TABLET | Freq: Three times a day (TID) | ORAL | 0 refills | Status: DC | PRN

## 2016-10-21 MED ORDER — LORAZEPAM 2 MG/ML IJ SOLN
1.0000 mg | Freq: Once | INTRAMUSCULAR | Status: AC
  Administered 2016-10-21: 1 mg via INTRAVENOUS
  Filled 2016-10-21: qty 1

## 2016-10-21 MED ORDER — LIDOCAINE 5 % EX PTCH
1.0000 | MEDICATED_PATCH | Freq: Once | CUTANEOUS | Status: DC
  Administered 2016-10-21: 1 via TRANSDERMAL
  Filled 2016-10-21: qty 1

## 2016-10-21 MED ORDER — METHOCARBAMOL 500 MG PO TABS: 500.0000 mg | ORAL_TABLET | Freq: Two times a day (BID) | ORAL | 0 refills | Status: DC

## 2016-10-21 NOTE — Discharge Instructions (Signed)
THE TESTS ON YOUR HEART ARE ALL NEGATIVE. CALL YOUR RHEUMATOLOGIST IN THE MORNING FOR FURTHER EVALUATION AND MANAGEMENT OF CHEST PAIN. RETURN TO THE EMERGENCY ROOM IF YOU HAVE WORSENING PAIN OR NEW CONCERN. CONTINUE YOUR REGULAR MEDICATIONS.

## 2016-10-30 DIAGNOSIS — I2510 Atherosclerotic heart disease of native coronary artery without angina pectoris: Secondary | ICD-10-CM | POA: Diagnosis not present

## 2016-10-30 DIAGNOSIS — Z09 Encounter for follow-up examination after completed treatment for conditions other than malignant neoplasm: Secondary | ICD-10-CM | POA: Diagnosis not present

## 2016-10-30 DIAGNOSIS — I201 Angina pectoris with documented spasm: Secondary | ICD-10-CM | POA: Diagnosis not present

## 2016-11-03 ENCOUNTER — Telehealth: Payer: Self-pay | Admitting: Pulmonary Disease

## 2016-11-03 NOTE — Telephone Encounter (Signed)
Spoke with pt, who states she had a mild heart attack on 10/14/16. Pt states since the heart attack she has had increased sob & non prod cough. Pt not currently seeing cardiologist. Pt states she spoke with her PCP and was instructed to start taken baby aspirin daily.   VS please advise. Thanks.

## 2016-11-03 NOTE — Telephone Encounter (Signed)
She needs to come in for assessment.

## 2016-11-03 NOTE — Telephone Encounter (Signed)
Pt scheduled for acute visit for JN for 11-04-16 @ 10:30. Nothing further needed.

## 2016-11-04 ENCOUNTER — Encounter: Payer: Self-pay | Admitting: Pulmonary Disease

## 2016-11-04 ENCOUNTER — Ambulatory Visit (INDEPENDENT_AMBULATORY_CARE_PROVIDER_SITE_OTHER): Payer: BLUE CROSS/BLUE SHIELD | Admitting: Pulmonary Disease

## 2016-11-04 ENCOUNTER — Ambulatory Visit (INDEPENDENT_AMBULATORY_CARE_PROVIDER_SITE_OTHER)
Admission: RE | Admit: 2016-11-04 | Discharge: 2016-11-04 | Disposition: A | Discharge status: Home / Self Care | Payer: BLUE CROSS/BLUE SHIELD | Source: Ambulatory Visit | Attending: Pulmonary Disease | Admitting: Pulmonary Disease

## 2016-11-04 VITALS — BP 122/82 | HR 82 | Ht 66.0 in | Wt 177.4 lb

## 2016-11-04 DIAGNOSIS — R0600 Dyspnea, unspecified: Secondary | ICD-10-CM

## 2016-11-04 DIAGNOSIS — R0602 Shortness of breath: Secondary | ICD-10-CM | POA: Diagnosis not present

## 2016-11-04 DIAGNOSIS — J45909 Unspecified asthma, uncomplicated: Secondary | ICD-10-CM | POA: Diagnosis not present

## 2016-11-04 DIAGNOSIS — R002 Palpitations: Secondary | ICD-10-CM | POA: Diagnosis not present

## 2016-11-04 DIAGNOSIS — D869 Sarcoidosis, unspecified: Secondary | ICD-10-CM | POA: Diagnosis not present

## 2016-11-04 DIAGNOSIS — R55 Syncope and collapse: Secondary | ICD-10-CM | POA: Diagnosis not present

## 2016-11-04 NOTE — Progress Notes (Signed)
Subjective:    Patient ID: [name], female    DOB: Jan 29, 1966, 51 y.o.   MRN: [id number]  C.C.:  Acute visit for Dyspnea w/ known Sarcoidosis & Asthma.  HPI Dyspnea:  She reports she had an MI but didn't require any stent placement or see any blockage that required intervention per patient. She does feel her dyspnea has been progressively worsening since 3 days after her "heart attack". She reports dyspnea with exertion as well as with talking excessively.   Sarcoidosis:  Primarily involving lymph nodes. Currently on methotrexate managed by rheumatology (Dr. Lenna Gilford). No eye swelling or erythema. She reports she did have palpitations the day of her heart attack. She has had occasional near syncope.   Asthma: At last appointment patient recommended to try stopping Breo. She reports a chronic, nonproductive cough. She reports her cough is at baseline. Denies any wheezing. She has continued to use her Breo Ellipta. She reports intermittent relief with her rescue medication.   Review of Systems She does have sweats but no fever or chills. She does have a constant headache. Questionable blurry vision in left eye intermittently. She reports intermittent chest discomfort that she describes as a "stabbing" sensation as though with a "knife". She denies any triggers for her chest pain. She denies any pleuritic component to her pain. She reports a general sensation of fatigue.   Allergies  Allergen Reactions  . Bactrim [Sulfamethoxazole-Trimethoprim] Other (See Comments)    Fever   . Percocet [Oxycodone-Acetaminophen] Itching  . Vicodin [Hydrocodone-Acetaminophen] Itching    Current Outpatient Prescriptions on File Prior to Visit  Medication Sig Dispense Refill  . albuterol (PROVENTIL HFA;VENTOLIN HFA) 108 (90 BASE) MCG/ACT inhaler Inhale 2 puffs into the lungs every 6 (six) hours as needed. For shortness of breath 3.7 g 1  . BREO ELLIPTA 100-25 MCG/INH AEPB INHALE 1 PUFF INTO THE LUNGS  DAILY. 60 each 5  . buPROPion (WELLBUTRIN XL) 300 MG 24 hr tablet Take 300 mg by mouth daily. Reported on 08/20/2015  11  . diazepam (VALIUM) 2 MG tablet Take 2 mg by mouth at bedtime.    Marland Kitchen levothyroxine (SYNTHROID, LEVOTHROID) 137 MCG tablet Take 137 mcg by mouth every morning.     . methotrexate (50 MG/ML) 1 g injection Inject 30 mg into the vein once a week. Take 0.6 mL weekly SQ    . naproxen (NAPROSYN) 500 MG tablet Take 500 mg by mouth 2 (two) times daily as needed for mild pain.     Marland Kitchen omeprazole (PRILOSEC) 40 MG capsule Take 40 mg by mouth at bedtime.    Marland Kitchen PARoxetine (PAXIL) 40 MG tablet Take 40 mg by mouth at bedtime.  3  . predniSONE (DELTASONE) 5 MG tablet Take 1 tablet (5 mg total) by mouth daily with breakfast. 30 tablet 2  . propranolol (INDERAL) 20 MG tablet Take 20 mg by mouth 2 (two) times daily.    Marland Kitchen rOPINIRole (REQUIP) 2 MG tablet Take 2 mg by mouth at bedtime.    . traMADol (ULTRAM) 50 MG tablet Take 1 tablet (50 mg total) by mouth every 6 (six) hours as needed. (Patient taking differently: Take 50 mg by mouth every 6 (six) hours as needed for moderate pain. ) 15 tablet 0  . traZODone (DESYREL) 50 MG tablet Take 50 mg by mouth at bedtime.    . valACYclovir (VALTREX) 500 MG tablet Take 500 mg by mouth at bedtime.    Marland Kitchen LORazepam (ATIVAN) 1 MG tablet Take  1 tablet (1 mg total) by mouth 3 (three) times daily as needed for anxiety. (Patient not taking: Reported on 11/04/2016) 15 tablet 0  . methocarbamol (ROBAXIN) 500 MG tablet Take 1 tablet (500 mg total) by mouth 2 (two) times daily. (Patient not taking: Reported on 11/04/2016) 20 tablet 0   No current facility-administered medications on file prior to visit.     Past Medical History:  Diagnosis Date  . Anxiety   . Arthritis    "back, hands, neck" (09/18/2014)  . Asthma   . Chronic back pain    "whole spine"  . Chronic bronchitis ([place])    "get it close to q yr" (09/18/2014)  . Depression   . Environmental allergies   .  Erosion of suburethral sling ([place])   . Frequency of urination   . GERD (gastroesophageal reflux disease)   . Headache    "at least several times/wk" (09/18/2014)  . Heart murmur    "when I was a baby"  . Herpes simplex type 2 infection   . HLA B27 positive   . Hypothyroidism   . IBS (irritable bowel syndrome)   . IC (interstitial cystitis)   . Migraine    "maybe once/month" (09/18/2014)  . Nocturia   . NSVD (normal spontaneous vaginal delivery)    x3  . RLS (restless legs syndrome)   . Sarcoid ([place])   . Seizures ([place])    "when I was a teenager"  . Shortness of breath dyspnea    Exertion  . SUI (stress urinary incontinence, female)     Past Surgical History:  Procedure Laterality Date  . ABDOMINAL HYSTERECTOMY  2006  . ANTERIOR CERVICAL DECOMP/DISCECTOMY FUSION  2011  . APPENDECTOMY  1985  . BACK SURGERY  2008   X 2 in 2010  . CESAREAN SECTION  2000  . CRANIECTOMY FOR DEPRESSED SKULL FRACTURE Left 12/05/2014   Procedure: Left frontal stereotactic craniectomy for biopsy of skull lesion;  Surgeon: Consuella Lose, MD;  Location: [place] [facility];  Service: Neurosurgery;  Laterality: Left;  Left frontal stereotactic craniectomy for biopsy of skull lesion  . CYSTO WITH HYDRODISTENSION N/A 11/30/2012   Procedure: CYSTOSCOPY/HYDRODISTENSION;  Surgeon: Bernestine Amass, MD;  Location: [facility];  Service: Urology;  Laterality: N/A;  . KNEE ARTHROSCOPY Right   . LUMBAR DISC SURGERY  02-02-2007   RIGHT SIDE L5 -- S1  . LUMBAR FUSION  12-04-2008   L4  -- S1  . LYNX RETROPUBIC SUBURETHRAL SLING  03-02-2007  . MEDIASTINOSCOPY N/A 10/26/2014   Procedure: MEDIASTINOSCOPY;  Surgeon: Melrose Nakayama, MD;  Location: [facility];  Service: Thoracic;  Laterality: N/A;  . PELVIC LAPAROSCOPY  1990's   LYSIS ADHESIONS  . PUBOVAGINAL SLING N/A 11/30/2012   Procedure: Gaynelle Arabian;  Surgeon: Bernestine Amass, MD;  Location: [facility];  Service: Urology;  Laterality:  N/A;  1 HR EXAM UNDER ANESTHESIA, EXCISION OF SUB URETHRAL MESH, CYSTO, HOD   . TONSILLECTOMY  1986  . TUBAL LIGATION  2001   hulka clip  . VIDEO BRONCHOSCOPY WITH ENDOBRONCHIAL ULTRASOUND N/A 09/21/2014   Procedure: VIDEO BRONCHOSCOPY WITH ENDOBRONCHIAL ULTRASOUND;  Surgeon: Collene Gobble, MD;  Location: [facility];  Service: Thoracic;  Laterality: N/A;    Family History  Problem Relation Age of Onset  . Diabetes Mother   . Hypertension Mother   . Heart disease Mother   . Cancer Father     LUNG AND BRAIN  . Hypertension Father   .  Heart disease Maternal Grandmother   . Cancer Maternal Grandfather     LUNG  . Heart disease Paternal Grandmother   . Cancer Paternal Grandfather     Social History   Social History  . Marital status: Married    Spouse name: N/A  . Number of children: N/A  . Years of education: N/A   Social History Main Topics  . Smoking status: Former Smoker    Packs/day: 0.50    Years: 15.00    Types: Cigarettes    Quit date: 01/15/2014  . Smokeless tobacco: Never Used  . Alcohol use 0.0 oz/week     Comment: ocassional  . Drug use: No  . Sexual activity: Yes   Other Topics Concern  . None   Social History Narrative  . None      Objective:   Physical Exam BP 122/82 (BP Location: Right Arm, Patient Position: Sitting, Cuff Size: Large)   Pulse 82   Ht '5\' 6"'$  (1.676 m)   Wt 177 lb 6.4 oz (80.5 kg)   LMP 06/03/2012   SpO2 96%   BMI 28.63 kg/m  General:  Awake. Alert. No acute distress. Mild central obesity.  Integument:  Warm & dry. No rash on exposed skin. No bruising on exposed skin. Extremities:  No cyanosis or clubbing.  HEENT:  Moist mucus membranes. No oral ulcers. No scleral injection or icterus. Minimal nasal turbinate swelling. Cardiovascular:  Regular rate. No edema. No appreciable JVD.  Pulmonary:  Good aeration & clear to auscultation bilaterally. Symmetric chest wall expansion. No accessory muscle use on room air. Abdomen: Soft. Normal  bowel sounds. Protuberant. Musculoskeletal:  Normal bulk and tone. No joint deformity or effusion appreciated. Tenderness to palpation parasternal on the left around the costochondral joint.  PFT 02/05/15: FVC 3.42 L (86% (FEV1 2.68 L (85%) FEV1/FVC 0.78 FEF 25-75 2.32 L (77%) negative bronchodilator response TLC 5.7 L (95%) RV 100% ERV 47% DLCO uncorrected 80%  IMAGING CXR PA/LAT 10/20/16 (personally reviewed by me):  Low lung volumes. No parenchymal mass or opacity appreciated. No pleural effusion. Heart normal in size & mediastinum normal in contour.  LABS 10/01/15 ACE:  19  09/03/15 ACE:  60  01/01/15 ACE:  26  09/20/14 ANA:  Negative ACE:  76  09/19/14 IgG: 769 IgA:  73 IgM:  42    Assessment & Plan:  51 y.o. female with known stage 1 sarcoidosis and asthma. Patient's symptoms of palpitations and near syncope are concerning for possible cardiac involvement of her sarcoidosis. With her chronic use of methotrexate methotrexate-induced lung toxicity is also a concern despite her normal chest x-ray imaging from late February. We will need to assess her with imaging for both possibilities. She will continue on methotrexate in her current inhaler regimen for now.  1. Sarcoidosis:  Checking cardiac MRI as soon as possible. Also checking CT chest without contrast as soon as possible. Continuing methotrexate. 2. Asthma: No evidence of acute exacerbation. Continuing Breo. 3. Atypical Chest Pain:  Likely costochondritis/musculoskeletal pain. No new medications at this time. 4. Follow-up: Return to clinic in 4 weeks or sooner if needed.   Sonia Baller Ashok Cordia, M.D. [facility] Pager:  [phone] After 3pm or if no response, call [phone] 10:58 AM 11/04/16

## 2016-11-04 NOTE — Patient Instructions (Addendum)
   Call our office if your symptoms get worse.  We will try to get your tests done as quickly as possible.  Continue using all your medications as prescribed for now.   TESTS ORDERED: 1. CARDIAC MRI ASAP - diagnosis sarcoidosis & palpitations 2. CT Chest w/o ASAP - Diagnosis dyspnea

## 2016-11-05 ENCOUNTER — Telehealth: Payer: Self-pay | Admitting: Pulmonary Disease

## 2016-11-05 NOTE — Telephone Encounter (Signed)
Called and spoke to pt. Pt is requesting we sent the recent CT chest report to her PCP, Dr. Maurice Small. This has been sent. Pt verbalized understanding and denied any further questions or concerns at this time.

## 2016-11-05 NOTE — Telephone Encounter (Signed)
Ct Chest Wo Contrast  Result Date: 11/04/2016 CLINICAL DATA:  History of sarcoidosis. Shortness of breath. Chest pain. EXAM: CT CHEST WITHOUT CONTRAST TECHNIQUE: Multidetector CT imaging of the chest was performed following the standard protocol without IV contrast. COMPARISON:  Chest CT 04/30/2015 FINDINGS: Cardiovascular: Heart is normal size. Aorta is normal caliber. Focal calcification within the left anterior descending coronary artery. Mediastinum/Nodes: Mildly enlarged bilateral hilar and mediastinal lymph nodes. AP window lymph node has a short axis of 12 mm compared to 10 mm previously. Subcarinal lymph node has decreased, measuring 10 mm compared to 17 mm previously. Right paratracheal lymph node has a short axis of 10 mm compared to 12 mm previously. It is difficult to measure hilar lymph nodes without intravenous contrast. Lungs/Pleura: Lungs are clear. No focal airspace opacities or suspicious nodules. No effusions. Tiny nodule in the lateral right middle lobe on image 100 is stable, approximately 2 mm compatible with a benign nodule. Upper Abdomen: Imaging into the upper abdomen shows no acute findings. Musculoskeletal: Chest wall soft tissues are unremarkable. No acute bony abnormality or focal bone lesion. IMPRESSION: Mild mediastinal and bilateral hilar adenopathy, stable or slightly improved overall since 2016. No acute cardiopulmonary disease. Focal calcification in the left anterior descending coronary artery. Electronically Signed   By: Rolm Baptise M.D.   On: 11/04/2016 13:30    Spoke with pt.  Discussed results.  Explained that she has some coronary calcifications.  No evidence for progression of pulmonary sarcoidosis, and any other lung pathology to explain her symptoms.  She reports having a heart attack while in [place] in February 2018.  She had heart cath >> was told there was CAD, but okay for medical management.  Advised her to d/w her PCP about getting labs and further cardiac  assessment, and whether she needs to be hospitalized for further assessment.

## 2016-11-05 NOTE — Telephone Encounter (Signed)
Spoke with pt. States that her symptoms are worse since seeing JN yesterday. Reports increased SOB, cough and weakness. Stated, "I walk 48ft and I just can't breath." Was instructed to call us if her symptoms got worse. Pt had a CT chest done yesterday and would like the results of this. She requests that I send this message to VS to address. Would like VS recommendations.  VS - please advise. Thanks.

## 2016-11-06 ENCOUNTER — Telehealth: Payer: Self-pay | Admitting: Pulmonary Disease

## 2016-11-06 ENCOUNTER — Encounter: Payer: Self-pay | Admitting: Pulmonary Disease

## 2016-11-06 ENCOUNTER — Telehealth: Payer: Self-pay

## 2016-11-06 NOTE — Telephone Encounter (Signed)
Called the patient and gave her the date, time and location of MRI.  Letter mailed today.

## 2016-11-06 NOTE — Telephone Encounter (Signed)
-----   Message from Emmaline Life, RN sent at 11/05/2016 10:32 AM EST ----- Uvaldo Bristle,  I talked to patient's husband who is our patient and his wife (name above) would like to get an appointment with Dr. Johnsie Cancel.  Nish treated her mom in the past and she wants to see him.  I told the husband that you would call the wife to schedule.  Thanks! Sharyn Lull

## 2016-11-06 NOTE — Telephone Encounter (Signed)
Made a new patient appointment for patient to see Dr. Johnsie Cancel. Requested records from patient's past cardiac test.

## 2016-11-13 NOTE — Progress Notes (Signed)
Spoke with patient and informed her of CT results. Pt did not have any questions. Nothing further is needed.

## 2016-11-14 ENCOUNTER — Other Ambulatory Visit: Payer: Self-pay | Admitting: Pulmonary Disease

## 2016-11-17 ENCOUNTER — Ambulatory Visit (HOSPITAL_COMMUNITY)
Admission: RE | Admit: 2016-11-17 | Discharge: 2016-11-17 | Disposition: A | Discharge status: Home / Self Care | Payer: BLUE CROSS/BLUE SHIELD | Source: Ambulatory Visit | Attending: Pulmonary Disease | Admitting: Pulmonary Disease

## 2016-11-17 DIAGNOSIS — R002 Palpitations: Secondary | ICD-10-CM | POA: Insufficient documentation

## 2016-11-17 DIAGNOSIS — D869 Sarcoidosis, unspecified: Secondary | ICD-10-CM | POA: Insufficient documentation

## 2016-11-17 LAB — CREATININE, SERUM
Creatinine, Ser: 0.99 mg/dL (ref 0.44–1.00)
GFR calc Af Amer: >60 mL/min (ref >60)

## 2016-11-17 MED ORDER — GADOBENATE DIMEGLUMINE 529 MG/ML IV SOLN
25.0000 mL | Freq: Once | INTRAVENOUS | Status: AC
  Administered 2016-11-17: 25 mL via INTRAVENOUS

## 2016-11-18 ENCOUNTER — Telehealth: Payer: Self-pay | Admitting: Pulmonary Disease

## 2016-11-18 NOTE — Progress Notes (Signed)
After review chart, the results should be visible on my chart. Pt was informed of this. Nothing further is needed.

## 2016-11-18 NOTE — Progress Notes (Signed)
Spoke with patient who was informed of results. Pt asked if they could be released to my chart. She also asked to refill her prednisone; will forward to Dr. Halford Chessman. She had no further questions. Nothing further is needed.

## 2016-11-19 MED ORDER — PREDNISONE 5 MG PO TABS: 5.0000 mg | ORAL_TABLET | Freq: Every day | ORAL | 0 refills | Status: DC

## 2016-11-19 NOTE — Telephone Encounter (Signed)
Can send refill for prednisone 5 mg daily, #30 with no refills.  Please inform her that Dr. Lenna Gilford normally refills her prednisone and future refills requests for this should go through Dr. Lenna Gilford with rheumatology.

## 2016-11-19 NOTE — Telephone Encounter (Signed)
Called spoke with patient, advised of VS' recommendations as stated  Pt voiced her understanding and will contact Dr Trudie Reed' office for future refills Rx sent to verified pharmacy Nothing further needed; will sign off

## 2016-11-19 NOTE — Telephone Encounter (Signed)
Called patient to inform her of results from acute visit with JN. Pt stated her pharmacy contacted office for prednisone 47m daily refills but they have not heard anything. Informed patient I would contact Dr. SHalford Chessmanand ask him if it was okay to refill. Dr. SHalford Chessmanplease advise if you would like to refill. Thank you!    Per OV from 07/29/2016   Assessment/Plan:  Systemic sarcoidosis with polyarthritis and HLA B27 positive - MTX, prednisone per rheumatology (Dr. HLenna Gilford - advised her to d/w rheumatology about whether she needs to be on PCP prophylaxis while on MTX

## 2016-12-01 ENCOUNTER — Emergency Department (HOSPITAL_COMMUNITY)
Admission: EM | Admit: 2016-12-01 | Discharge: 2016-12-01 | Disposition: A | Discharge status: Home / Self Care | Payer: BLUE CROSS/BLUE SHIELD | Attending: Emergency Medicine | Admitting: Emergency Medicine

## 2016-12-01 ENCOUNTER — Emergency Department (HOSPITAL_COMMUNITY): Payer: BLUE CROSS/BLUE SHIELD

## 2016-12-01 ENCOUNTER — Encounter: Payer: Self-pay | Admitting: Cardiovascular Disease

## 2016-12-01 DIAGNOSIS — R072 Precordial pain: Secondary | ICD-10-CM | POA: Diagnosis present

## 2016-12-01 DIAGNOSIS — E039 Hypothyroidism, unspecified: Secondary | ICD-10-CM | POA: Diagnosis not present

## 2016-12-01 DIAGNOSIS — Z79899 Other long term (current) drug therapy: Secondary | ICD-10-CM | POA: Insufficient documentation

## 2016-12-01 DIAGNOSIS — J45909 Unspecified asthma, uncomplicated: Secondary | ICD-10-CM | POA: Diagnosis not present

## 2016-12-01 DIAGNOSIS — R0789 Other chest pain: Secondary | ICD-10-CM | POA: Diagnosis not present

## 2016-12-01 DIAGNOSIS — Z7982 Long term (current) use of aspirin: Secondary | ICD-10-CM | POA: Insufficient documentation

## 2016-12-01 DIAGNOSIS — Z87891 Personal history of nicotine dependence: Secondary | ICD-10-CM | POA: Diagnosis not present

## 2016-12-01 DIAGNOSIS — R079 Chest pain, unspecified: Secondary | ICD-10-CM | POA: Diagnosis not present

## 2016-12-01 DIAGNOSIS — M12812 Other specific arthropathies, not elsewhere classified, left shoulder: Secondary | ICD-10-CM | POA: Diagnosis not present

## 2016-12-01 DIAGNOSIS — M129 Arthropathy, unspecified: Secondary | ICD-10-CM | POA: Diagnosis not present

## 2016-12-01 LAB — I-STAT TROPONIN, ED
Troponin i, poc: 0 ng/mL (ref 0.00–0.08)
Troponin i, poc: 0 ng/mL (ref 0.00–0.08)

## 2016-12-01 LAB — BASIC METABOLIC PANEL
ANION GAP: 8 (ref 5–15)
BUN: 18 mg/dL (ref 6–20)
CHLORIDE: 110 mmol/L (ref 101–111)
CO2: 21 mmol/L — ABNORMAL LOW (ref 22–32)
Calcium: 8.3 mg/dL — ABNORMAL LOW (ref 8.9–10.3)
Creatinine, Ser: 0.79 mg/dL (ref 0.44–1.00)
GFR calc Af Amer: >60 mL/min (ref >60)
GFR calc non Af Amer: >60 mL/min (ref >60)
Glucose, Bld: 97 mg/dL (ref 65–99)
POTASSIUM: 4.1 mmol/L (ref 3.5–5.1)
SODIUM: 139 mmol/L (ref 135–145)

## 2016-12-01 LAB — CBC
HEMATOCRIT: 31.6 % — AB (ref 36.0–46.0)
HEMOGLOBIN: 10.9 g/dL — AB (ref 12.0–15.0)
MCH: 30.9 pg (ref 26.0–34.0)
MCHC: 34.5 g/dL (ref 30.0–36.0)
MCV: 89.5 fL (ref 78.0–100.0)
Platelets: 205 10*3/uL (ref 150–400)
RBC: 3.53 MIL/uL — ABNORMAL LOW (ref 3.87–5.11)
RDW: 14.8 % (ref 11.5–15.5)
WBC: 7.5 10*3/uL (ref 4.0–10.5)

## 2016-12-01 MED ORDER — IBUPROFEN 800 MG PO TABS
800.0000 mg | ORAL_TABLET | Freq: Once | ORAL | Status: DC
  Filled 2016-12-01: qty 1

## 2016-12-01 MED ORDER — MORPHINE SULFATE (PF) 4 MG/ML IV SOLN
4.0000 mg | Freq: Once | INTRAVENOUS | Status: AC
  Administered 2016-12-01: 4 mg via INTRAVENOUS
  Filled 2016-12-01: qty 1

## 2016-12-01 MED ORDER — NITROGLYCERIN 2 % TD OINT
1.0000 [in_us] | TOPICAL_OINTMENT | Freq: Once | TRANSDERMAL | Status: AC
  Administered 2016-12-01: 1 [in_us] via TOPICAL
  Filled 2016-12-01: qty 1

## 2016-12-01 NOTE — ED Provider Notes (Signed)
[facility] DEPT Provider Note   CSN: [id number] Arrival date & time: 12/01/16  1409     History   Chief Complaint Chief Complaint  Patient presents with  . Chest Pain    HPI [name] is a 51 y.o. female presenting via EMS with substernal sharp chest pain. She also reports that her left arm feels heavy and weak. Associated nausea, dry heaves, sweats, headache, shortness of breath. This is worsened by activity and better with rest. She explains that her blood pressure was elevated since the 28th with 170/110 high. She communicated with her primary care who increased her propranolol to double her regular dose. She did take it this morning.  Patient reports that she had a non-stemi February 14th and went to the cath lab where they only found 20-30% blockages and no stents were placed.  Denies history of DVT/PE, estrogen use, prolonged immobilization, recent surgery, hemoptysis, leg swelling, calf pain. HPI  Past Medical History:  Diagnosis Date  . Anxiety   . Arthritis    "back, hands, neck" (09/18/2014)  . Asthma   . Chronic back pain    "whole spine"  . Chronic bronchitis ([place])    "get it close to q yr" (09/18/2014)  . Depression   . Environmental allergies   . Erosion of suburethral sling ([place])   . Frequency of urination   . GERD (gastroesophageal reflux disease)   . Headache    "at least several times/wk" (09/18/2014)  . Heart murmur    "when I was a baby"  . Herpes simplex type 2 infection   . HLA B27 positive   . Hypothyroidism   . IBS (irritable bowel syndrome)   . IC (interstitial cystitis)   . Migraine    "maybe once/month" (09/18/2014)  . Nocturia   . NSVD (normal spontaneous vaginal delivery)    x3  . RLS (restless legs syndrome)   . Sarcoid ([place])   . Seizures ([place])    "when I was a teenager"  . Shortness of breath dyspnea    Exertion  . SUI (stress urinary incontinence, female)     Patient Active Problem List   Diagnosis Date Noted  .  Palpitations 11/04/2016  . Near syncope 11/04/2016  . Dyspnea 11/04/2016  . Headache 11/14/2014  . Sarcoidosis ([place]) 11/12/2014  . Left-sided weakness   . Left hemiparesis ([place]) 09/18/2014  . Weakness 09/18/2014  . Frontal skull lesion 09/18/2014  . Atypical chest pain 09/18/2014  . Extrinsic asthma 04/25/2013  . Weight gain 01/12/2013  . Erosion of graft 11/16/2012    Past Surgical History:  Procedure Laterality Date  . ABDOMINAL HYSTERECTOMY  2006  . ANTERIOR CERVICAL DECOMP/DISCECTOMY FUSION  2011  . APPENDECTOMY  1985  . BACK SURGERY  2008   X 2 in 2010  . CESAREAN SECTION  2000  . CRANIECTOMY FOR DEPRESSED SKULL FRACTURE Left 12/05/2014   Procedure: Left frontal stereotactic craniectomy for biopsy of skull lesion;  Surgeon: Consuella Lose, MD;  Location: [facility];  Service: Neurosurgery;  Laterality: Left;  Left frontal stereotactic craniectomy for biopsy of skull lesion  . CYSTO WITH HYDRODISTENSION N/A 11/30/2012   Procedure: CYSTOSCOPY/HYDRODISTENSION;  Surgeon: Bernestine Amass, MD;  Location: [facility];  Service: Urology;  Laterality: N/A;  . KNEE ARTHROSCOPY Right   . LUMBAR DISC SURGERY  02-02-2007   RIGHT SIDE L5 -- S1  . LUMBAR FUSION  12-04-2008   L4  -- S1  . LYNX RETROPUBIC SUBURETHRAL  SLING  03-02-2007  . MEDIASTINOSCOPY N/A 10/26/2014   Procedure: MEDIASTINOSCOPY;  Surgeon: Melrose Nakayama, MD;  Location: [facility];  Service: Thoracic;  Laterality: N/A;  . PELVIC LAPAROSCOPY  1990's   LYSIS ADHESIONS  . PUBOVAGINAL SLING N/A 11/30/2012   Procedure: Gaynelle Arabian;  Surgeon: Bernestine Amass, MD;  [facility];  Service: Urology;  Laterality: N/A;  1 HR EXAM UNDER ANESTHESIA, EXCISION OF SUB URETHRAL MESH, CYSTO, HOD   . TONSILLECTOMY  1986  . TUBAL LIGATION  2001   hulka clip  . VIDEO BRONCHOSCOPY WITH ENDOBRONCHIAL ULTRASOUND N/A 09/21/2014   Procedure: VIDEO BRONCHOSCOPY WITH ENDOBRONCHIAL ULTRASOUND;  Surgeon:  Collene Gobble, MD;  Location: [facility];  Service: Thoracic;  Laterality: N/A;    OB History    Gravida Para Term Preterm AB Living   '4 4       4   '$ SAB TAB Ectopic Multiple Live Births                   Home Medications    Prior to Admission medications   Medication Sig Start Date End Date Taking? Authorizing Provider  albuterol (PROVENTIL HFA;VENTOLIN HFA) 108 (90 BASE) MCG/ACT inhaler Inhale 2 puffs into the lungs every 6 (six) hours as needed. For shortness of breath Patient taking differently: Inhale 2 puffs into the lungs every 6 (six) hours as needed for shortness of breath.  04/25/13  Yes Elayne Snare, MD  aspirin 81 MG EC tablet Take 81 mg by mouth daily. Swallow whole.   Yes Historical Provider, MD  BREO ELLIPTA 100-25 MCG/INH AEPB INHALE 1 PUFF INTO THE LUNGS DAILY. 08/20/16  Yes Chesley Mires, MD  buPROPion (WELLBUTRIN XL) 300 MG 24 hr tablet Take 300 mg by mouth daily. Reported on 08/20/2015 01/15/15  Yes Historical Provider, MD  diazepam (VALIUM) 2 MG tablet Take 2 mg by mouth at bedtime.   Yes Historical Provider, MD  dicyclomine (BENTYL) 20 MG tablet Take 20 mg by mouth 2 (two) times daily as needed for spasms.    Yes Historical Provider, MD  folic acid (FOLVITE) 1 MG tablet Take 2 mg by mouth at bedtime.  11/15/16  Yes Historical Provider, MD  HYDROmorphone (DILAUDID) 4 MG tablet Take 4 mg by mouth 2 (two) times daily as needed for pain. 11/16/16  Yes Historical Provider, MD  levothyroxine (SYNTHROID, LEVOTHROID) 137 MCG tablet Take 137 mcg by mouth every morning.    Yes Historical Provider, MD  methotrexate (50 MG/ML) 1 g injection Inject 30 mg into the vein once a week. Take 0.6 mL weekly SQ   Yes Historical Provider, MD  naproxen (NAPROSYN) 500 MG tablet Take 500 mg by mouth 2 (two) times daily as needed (for pain).    Yes Historical Provider, MD  nitroGLYCERIN (NITROSTAT) 0.4 MG SL tablet Place 0.4 mg under the tongue every 5 (five) minutes as needed for chest pain.  10/30/16  Yes  Historical Provider, MD  omeprazole (PRILOSEC) 40 MG capsule Take 40 mg by mouth at bedtime.   Yes Historical Provider, MD  PARoxetine (PAXIL) 40 MG tablet Take 40 mg by mouth at bedtime. 11/29/15  Yes Historical Provider, MD  predniSONE (DELTASONE) 5 MG tablet Take 1 tablet (5 mg total) by mouth daily with breakfast. 11/19/16  Yes Chesley Mires, MD  propranolol (INDERAL) 40 MG tablet Take 40 mg by mouth 2 (two) times daily. 12/01/16  Yes Historical Provider, MD  rOPINIRole (REQUIP) 2 MG tablet Take  2 mg by mouth at bedtime.   Yes Historical Provider, MD  traMADol (ULTRAM) 50 MG tablet Take 1 tablet (50 mg total) by mouth every 6 (six) hours as needed. Patient taking differently: Take 50 mg by mouth every 6 (six) hours as needed (for pain or migraines).  01/08/15  Yes Hollace Kinnier Sofia, PA-C  traZODone (DESYREL) 50 MG tablet Take 50 mg by mouth at bedtime.   Yes Historical Provider, MD  valACYclovir (VALTREX) 500 MG tablet Take 500 mg by mouth at bedtime.   Yes Historical Provider, MD  LORazepam (ATIVAN) 1 MG tablet Take 1 tablet (1 mg total) by mouth 3 (three) times daily as needed for anxiety. Patient not taking: Reported on 11/04/2016 10/21/16   Charlann Lange, PA-C  methocarbamol (ROBAXIN) 500 MG tablet Take 1 tablet (500 mg total) by mouth 2 (two) times daily. Patient not taking: Reported on 11/04/2016 10/21/16   Charlann Lange, PA-C    Family History Family History  Problem Relation Age of Onset  . Diabetes Mother   . Hypertension Mother   . Heart disease Mother   . Cancer Father     LUNG AND BRAIN  . Hypertension Father   . Heart disease Maternal Grandmother   . Cancer Maternal Grandfather     LUNG  . Heart disease Paternal Grandmother   . Cancer Paternal Grandfather     Social History Social History  Substance Use Topics  . Smoking status: Former Smoker    Packs/day: 0.50    Years: 15.00    Types: Cigarettes    Quit date: 01/15/2014  . Smokeless tobacco: Never Used  . Alcohol use 0.0  oz/week     Comment: ocassional     Allergies   Bactrim [sulfamethoxazole-trimethoprim]; Percocet [oxycodone-acetaminophen]; and Vicodin [hydrocodone-acetaminophen]   Review of Systems Review of Systems  Constitutional: Negative for chills and fever.  HENT: Negative for ear pain and sore throat.   Eyes: Negative for pain and visual disturbance.  Respiratory: Positive for cough. Negative for choking, chest tightness, shortness of breath, wheezing and stridor.        Respiratory chronic cough from sarcoid  Cardiovascular: Positive for chest pain. Negative for palpitations and leg swelling.  Gastrointestinal: Positive for nausea and vomiting. Negative for abdominal distention and abdominal pain.  Genitourinary: Negative for dysuria and hematuria.  Musculoskeletal: Negative for arthralgias, back pain, neck pain and neck stiffness.  Skin: Negative for color change, pallor and rash.  Neurological: Negative for seizures and syncope.     Physical Exam Updated Vital Signs BP 121/84   Pulse 70   Temp 98 F (36.7 C) (Oral)   Resp 20   Ht '5\' 6"'$  (1.676 m)   Wt 78.9 kg   LMP 06/03/2012   SpO2 96%   BMI 28.08 kg/m   Physical Exam  Constitutional: She is oriented to person, place, and time. She appears well-developed and well-nourished. No distress.  Patient is afebrile, nontoxic-appearing, sitting comfortably in bed in no acute distress  HENT:  Head: Normocephalic and atraumatic.  Eyes: Conjunctivae and EOM are normal. Pupils are equal, round, and reactive to light. Right eye exhibits no discharge. Left eye exhibits no discharge.  Neck: Normal range of motion. Neck supple. No JVD present. No tracheal deviation present.  Cardiovascular: Normal rate, regular rhythm, normal heart sounds and intact distal pulses.   No murmur heard. No friction rub appreciated  Pulmonary/Chest: Effort normal and breath sounds normal. No respiratory distress. She has no wheezes. She has  no rales. She  exhibits no tenderness.  Abdominal: Soft. She exhibits no distension and no mass. There is no tenderness. There is no guarding.  Musculoskeletal: She exhibits no edema or deformity.  Positive NEER, Hawkins and empty can test of the left shoulder. Full range of motion of the shoulder no swelling erythema or warmth.   Neurological: She is alert and oriented to person, place, and time. No cranial nerve deficit or sensory deficit. She exhibits normal muscle tone. Coordination normal.  Neurologic Exam:   - Mental status: Patient is alert and cooperative. Fluent speech and words are clear. Coherent thought processes and insight is good. Patient is oriented x 4 to person, place, time and event.   - Cranial nerves:  CN III, IV, VI: pupils equally round, reactive to light both direct and conscensual and normal accommodation. Full extra-ocular movement. CN V: motor temporalis and masseter strength intact. CN VII : muscles of facial expression intact. CN X :  midline uvula. XI strength of sternocleidomastoid and trapezius muscles 5/5, XII: tongue is midline when protruded.  - Motor: No involuntary movements. Muscle tone and bulk normal throughout. Muscle strength is 5/5 in bilateral shoulder abduction, elbow flexion and extension, wrist  thumb opposition, grip, hip flexion, leg flexion ankle dorsiflexion and plantar flexion.   - Sensory: Proprioception, light tough sensation intact in all extremities.   - Cerebellar: rapid alternating movements and point to point movement intact in upper and lower extremities.    Skin: Skin is warm and dry. No rash noted. She is not diaphoretic. No erythema. No pallor.  Psychiatric: She has a normal mood and affect.  Nursing note and vitals reviewed.    ED Treatments / Results  Labs (all labs ordered are listed, but only abnormal results are displayed) Labs Reviewed  BASIC METABOLIC PANEL - Abnormal; Notable for the following:       Result Value   CO2 21 (*)     Calcium 8.3 (*)    All other components within normal limits  CBC - Abnormal; Notable for the following:    RBC 3.53 (*)    Hemoglobin 10.9 (*)    HCT 31.6 (*)    All other components within normal limits  I-STAT TROPOININ, ED  I-STAT TROPOININ, ED    EKG  EKG Interpretation  Date/Time:  Tuesday December 01 2016 14:13:46 EDT Ventricular Rate:  60 PR Interval:    QRS Duration: 103 QT Interval:  428 QTC Calculation: 428 R Axis:   39 Text Interpretation:  Sinus rhythm Low voltage, precordial leads RSR' in V1 or V2, probably normal variant Confirmed by Johnney Killian, MD, Jeannie Done [phone]) on 12/01/2016 2:24:37 PM Also confirmed by Johnney Killian, MD, Jeannie Done [phone]), editor Drema Pry [phone])  on 12/01/2016 2:34:59 PM       Radiology Dg Chest 2 View  Result Date: 12/01/2016 CLINICAL DATA:  Chest pain for the past hour. EXAM: CHEST  2 VIEW COMPARISON:  CT chest 11/04/2016.  PA and lateral chest 10/20/2016. FINDINGS: The lungs are clear. Heart size is normal. No pneumothorax or pleural effusion. No focal bony abnormality. The patient is status post cervical fusion. IMPRESSION: No acute disease. Electronically Signed   By: Inge Rise M.D.   On: 12/01/2016 14:56    Procedures Procedures (including critical care time)  Medications Ordered in ED Medications  ibuprofen (ADVIL,MOTRIN) tablet 800 mg (800 mg Oral Not Given 12/01/16 1557)  nitroGLYCERIN (NITROGLYN) 2 % ointment 1 inch (1 inch Topical Given 12/01/16 1558)  morphine  4 MG/ML injection 4 mg (4 mg Intravenous Given 12/01/16 1725)     Initial Impression / Assessment and Plan / ED Course  I have reviewed the triage vital signs and the nursing notes.  Pertinent labs & imaging results that were available during my care of the patient were reviewed by me and considered in my medical decision making (see chart for details).     Patient presents with sudden onset gradually worsening sharp substernal chest pain radiating into her arm and  jaw Reported history of non-STEMI with 20-30% occlusion found on cath. No stents  Pain is somewhat relieved by leaning back, worse with leaning forward. No friction rub appreciated, no muffled heart sounds. No JVD.  Positive Hawkins, NEER and empty can test suggest a rotator cuff type injury to the left shoulder that may explain the pain she is describing in shoulder and upper arm.  Due to very recent cath with minimal occlusion 20-30%, negative workup and patient hx of anxiety related to her strong family history of heart disease, I suspect that this may be related to anxiety and suspicion is low for cardiac etiology of chest pain at this time.  On reassessment after morphine, she was calm and rested and symptoms had improved. Low suspicion for PE, stable vitals, normal O2 sats and Wells criteria: 0  Patient was discussed with Dr. Johnney Killian who agrees with assessment and plan.  Discussed strict return precautions and advised to return to the emergency department if experiencing any new or worsening symptoms. Instructions were understood and patient agreed with discharge plan.  Final Clinical Impressions(s) / ED Diagnoses   Final diagnoses:  Other chest pain  Rotator cuff arthropathy, left    New Prescriptions New Prescriptions   No medications on file     Dossie Der 12/01/16 1927    Charlesetta Shanks, MD 12/02/16 0900

## 2016-12-01 NOTE — ED Notes (Signed)
Pt st's no relief from chest pain with NTG ointment.  Rates pain #9 on pain scale 0/10.  Morphine 4mg  IV given

## 2016-12-01 NOTE — ED Triage Notes (Addendum)
CP that has been going on for the last hour described as crushing and sharp that radiates to L jaw and L arm with associated N/V, weakness. Took 2 nitro at home on onset of pain and EMS gave 3 more nitro with minimal relief, 324 ASA, and 4 zofran. Pressure dropped to 98 systolic and she received 500 Ml fluid with EMS. Hy. Of NSTEMI

## 2016-12-01 NOTE — ED Notes (Signed)
Spoke with Network engineer about acquiring the requested medical records

## 2016-12-01 NOTE — Discharge Instructions (Signed)
As discussed, follow up with your primary care provider tomorrow for management of chronic pain and follow up with orthopedics if shoulder pain persists.  You can try ice and ibuprofen for pain as well as shoulder exercises provided.  Return to the emergency department if you experience worsening of chest pain, shortness of breath on exertion or any new concerning symptoms in the meantime.

## 2016-12-10 ENCOUNTER — Encounter: Payer: Self-pay | Admitting: Pulmonary Disease

## 2016-12-10 ENCOUNTER — Ambulatory Visit (INDEPENDENT_AMBULATORY_CARE_PROVIDER_SITE_OTHER): Payer: BLUE CROSS/BLUE SHIELD | Admitting: Pulmonary Disease

## 2016-12-10 VITALS — BP 116/86 | HR 66 | Ht 66.5 in | Wt 179.8 lb

## 2016-12-10 DIAGNOSIS — J4530 Mild persistent asthma, uncomplicated: Secondary | ICD-10-CM

## 2016-12-10 DIAGNOSIS — R079 Chest pain, unspecified: Secondary | ICD-10-CM | POA: Diagnosis not present

## 2016-12-10 DIAGNOSIS — D869 Sarcoidosis, unspecified: Secondary | ICD-10-CM

## 2016-12-10 NOTE — Patient Instructions (Signed)
Follow up in 6 months 

## 2016-12-10 NOTE — Progress Notes (Signed)
Current Outpatient Prescriptions on File Prior to Visit  Medication Sig  . albuterol (PROVENTIL HFA;VENTOLIN HFA) 108 (90 BASE) MCG/ACT inhaler Inhale 2 puffs into the lungs every 6 (six) hours as needed. For shortness of breath (Patient taking differently: Inhale 2 puffs into the lungs every 6 (six) hours as needed for shortness of breath. )  . aspirin 81 MG EC tablet Take 81 mg by mouth daily. Swallow whole.  Marland Kitchen BREO ELLIPTA 100-25 MCG/INH AEPB INHALE 1 PUFF INTO THE LUNGS DAILY.  Marland Kitchen buPROPion (WELLBUTRIN XL) 300 MG 24 hr tablet Take 300 mg by mouth daily. Reported on 08/20/2015  . diazepam (VALIUM) 2 MG tablet Take 2 mg by mouth at bedtime.  . dicyclomine (BENTYL) 20 MG tablet Take 20 mg by mouth 2 (two) times daily as needed for spasms.   . folic acid (FOLVITE) 1 MG tablet Take 2 mg by mouth at bedtime.   Marland Kitchen HYDROmorphone (DILAUDID) 4 MG tablet Take 4 mg by mouth 2 (two) times daily as needed for pain.  Marland Kitchen levothyroxine (SYNTHROID, LEVOTHROID) 137 MCG tablet Take 137 mcg by mouth every morning.   . methocarbamol (ROBAXIN) 500 MG tablet Take 1 tablet (500 mg total) by mouth 2 (two) times daily.  . methotrexate (50 MG/ML) 1 g injection Inject 30 mg into the vein once a week. Take 0.6 mL weekly SQ  . naproxen (NAPROSYN) 500 MG tablet Take 500 mg by mouth 2 (two) times daily as needed (for pain).   . nitroGLYCERIN (NITROSTAT) 0.4 MG SL tablet Place 0.4 mg under the tongue every 5 (five) minutes as needed for chest pain.   Marland Kitchen omeprazole (PRILOSEC) 40 MG capsule Take 40 mg by mouth at bedtime.  Marland Kitchen PARoxetine (PAXIL) 40 MG tablet Take 40 mg by mouth at bedtime.  . predniSONE (DELTASONE) 5 MG tablet Take 1 tablet (5 mg total) by mouth daily with breakfast.  . propranolol (INDERAL) 40 MG tablet Take 40 mg by mouth 2 (two) times daily.  Marland Kitchen rOPINIRole (REQUIP) 2 MG tablet Take 2 mg by mouth at bedtime.  . traMADol (ULTRAM) 50 MG tablet Take 1 tablet (50 mg total) by mouth every 6 (six) hours as needed.  (Patient taking differently: Take 50 mg by mouth every 6 (six) hours as needed (for pain or migraines). )  . traZODone (DESYREL) 50 MG tablet Take 50 mg by mouth at bedtime.  . valACYclovir (VALTREX) 500 MG tablet Take 500 mg by mouth at bedtime.   No current facility-administered medications on file prior to visit.     Chief Complaint  Patient presents with  . Follow-up    Pt states that she still has SOB with activity bt there is no change to this. No new complaints. Pt states that she did suffer fom a heart attack on 10/14/16    Events 09/20/14 Quantiferon gold >> negative 10/26/14 Mediastinoscopy >> granulomas 11/12/14 Start prednisone  Pulmonary function tests 02/05/15 >> FEV1 2.96 (94%), FEV1% 80, TLC 5.27 (95%), DLCO 80%, +BD  Imaging 09/18/14 CT head >> 12 x 6 mm Lt frontal lytic lesion 09/18/14 MRI brain >> 12 mm Lt frontal lytic lesion 09/18/14 Bone scan >> no other lesions 09/19/14 CT chest >> Rt paratracheal LAN 1.5 cm, Rt subcarinal LAN 2 cm, Rt hilar LAN 1.4 cm, Lt hilar LAN 1.7 cm 09/19/14 CT abd/pelvis >> 1.6 cm LAN at gastrohepatic ligament, 1.3 cm LAN at portal vein, 0.9 cm LAN Rt renal artery 04/30/15 CT chest >> decreased mediastinal and hilar  LAN, stable 2 mm nodule RML 11/04/16 CT chest >> mild LAN, coronary calcification 11/17/16 Cardiac MR >> EF 69%, normal RV fx, negative for sarcoidosis  Serology 09/20/14 ACE 76, ANA negative 01/01/15 ACE 26 09/03/15 ACE 60 10/01/15 ACE 19  Past medical history GERD, IBS, RLS, Depression, Anxiety, Hypothyroidism, Interstitial cystitis, Chronic back pain, HLA B27 positive  Past surgical history, Family history, Social history, Allergies reviewed  Vital signs Blood pressure 116/86, pulse 66, height 5' 6.5" (1.689 m), weight 179 lb 12.8 oz (81.6 kg), last menstrual period 06/03/2012, SpO2 96 %.  History of Present Illness: [name] is a 51 y.o. female with systemic sarcoidosis and asthma.  Since her last visit she was  seen by Dr. Ashok Cordia.  She has CT chest and cardiac MRI, both of which were unrevealing.  She went to ER last week with chest pain going to her left shoulder.  She gets this type of pain intermittently when doing activity.  It will improve within a few minutes after she takes 1 or 2 NTG.  She hasn't tried using her albuterol when she gets these episodes.  She does get occasional dry cough.  She hasn't noticed wheeze, fever, or abdominal symptoms.  She continues to have joint pains.  When her cough flares up she will take an extra prednisone and this helps.  She has noticed more sinus congestion with change of seasons.  Physical Exam:  General - pleasant ENT - no sinus tenderness, clear nasal drainage, no oral exudate, no LAN Cardiac - regular, no murmur Chest - no wheeze Back - no tenderness Abd - soft, non tender Ext - no edema Neuro - normal strength Skin - no rashes Psych - normal mood   BMP Latest Ref Rng & Units 12/01/2016 11/17/2016 10/20/2016  Glucose 65 - 99 mg/dL 97 - 102(H)  BUN 6 - 20 mg/dL 18 - 14  Creatinine 0.44 - 1.00 mg/dL 0.79 0.99 0.98  Sodium 135 - 145 mmol/L 139 - 138  Potassium 3.5 - 5.1 mmol/L 4.1 - 4.3  Chloride 101 - 111 mmol/L 110 - 106  CO2 22 - 32 mmol/L 21(L) - 22  Calcium 8.9 - 10.3 mg/dL 8.3(L) - 9.3    CBC Latest Ref Rng & Units 12/01/2016 10/20/2016 04/28/2016  WBC 4.0 - 10.5 K/uL 7.5 8.3 8.2  Hemoglobin 12.0 - 15.0 g/dL 10.9(L) 12.8 13.0  Hematocrit 36.0 - 46.0 % 31.6(L) 37.7 37.7  Platelets 150 - 400 K/uL 205 249 268.0    Dg Chest 2 View  Result Date: 12/01/2016 CLINICAL DATA:  Chest pain for the past hour. EXAM: CHEST  2 VIEW COMPARISON:  CT chest 11/04/2016.  PA and lateral chest 10/20/2016. FINDINGS: The lungs are clear. Heart size is normal. No pneumothorax or pleural effusion. No focal bony abnormality. The patient is status post cervical fusion. IMPRESSION: No acute disease. Electronically Signed   By: Inge Rise M.D.   On: 12/01/2016 14:56     ECG 12/01/16 - sinus rhythm   Assessment/Plan:  Chest pain with exertion. - her symptoms are suggestive of angina pectoris and she reports symptomatic improvement with SL NTG - she has f/u appt with cardiology on 12/15/16 - some of her symptoms could also be related to asthma >> advised her to try using albuterol  Systemic sarcoidosis with polyarthritis and HLA B27 positive - Folow up with rheumatology rheumatology (Dr. Lenna Gilford)  Asthma - could be part of sarcoidosis involvement of airways - continue Breo and prn albuterol  Patient Instructions  Follow up in 6 months   Chesley Mires, MD [facility] Pager:  [phone] 12/10/2016, 12:03 PM

## 2016-12-11 NOTE — Progress Notes (Signed)
Cardiology Office Note   Date:  12/15/2016   ID:  [name], DOB 11-Oct-1965, MRN [id number]  PCP:  Jonathon Bellows, MD  Cardiologist:   Jenkins Rouge, MD   Chief Complaint  Patient presents with  . Establish Care      History of Present Illness: [name] is a 51 y.o. female who presents for evaluation of cardiac sarcoid. Sees Dr Halford Chessman and Ashok Cordia For pulmonary sarcoid. Reviewed her CT from 11/04/16 and had mild hilar adenopathy stable or improved slightly from 2016. 12/01/16 seen by EMS for sharp substernal chest pain with left arm heaviness and weakness. BP was up and beta blocker Increased. Reports having heart cath 10/14/2016 no blockages noted but she says she had a "NSTEMI" ? Rotator cuff injury And ER notes indicate anxiety about family history of CAD  R/O no ECG changes CXR NAD Has also had cardiac MRI to assess for cardiac sarcoid  Reviewed 11/17/16:  EF 69% no gadolinium uptake   Reviewed records from [facility] Troponin only .02, .02  .07  ECG 10/21/16  Normal rate 86  Echo EF 55-60% No significant valve disease mild LVH CXR NAD   Cath : right radial artery Normal right dominant coronary artery EF 60-65% EDP 2   Labs LDL 190 TC 286    Past Medical History:  Diagnosis Date  . Anxiety   . Arthritis    "back, hands, neck" (09/18/2014)  . Asthma   . Chronic back pain    "whole spine"  . Chronic bronchitis ([place])    "get it close to q yr" (09/18/2014)  . Depression   . Environmental allergies   . Erosion of suburethral sling ([place])   . Frequency of urination   . GERD (gastroesophageal reflux disease)   . Headache    "at least several times/wk" (09/18/2014)  . Heart murmur    "when I was a baby"  . Herpes simplex type 2 infection   . HLA B27 positive   . Hypothyroidism   . IBS (irritable bowel syndrome)   . IC (interstitial cystitis)   . Migraine    "maybe once/month" (09/18/2014)  . Nocturia   . NSVD (normal  spontaneous vaginal delivery)    x3  . RLS (restless legs syndrome)   . Sarcoid   . Seizures ([place])    "when I was a teenager"  . Shortness of breath dyspnea    Exertion  . SUI (stress urinary incontinence, female)     Past Surgical History:  Procedure Laterality Date  . ABDOMINAL HYSTERECTOMY  2006  . ANTERIOR CERVICAL DECOMP/DISCECTOMY FUSION  2011  . APPENDECTOMY  1985  . BACK SURGERY  2008   X 2 in 2010  . CESAREAN SECTION  2000  . CRANIECTOMY FOR DEPRESSED SKULL FRACTURE Left 12/05/2014   Procedure: Left frontal stereotactic craniectomy for biopsy of skull lesion;  Surgeon: Consuella Lose, MD;  Location: [facility];  Service: Neurosurgery;  Laterality: Left;  Left frontal stereotactic craniectomy for biopsy of skull lesion  . CYSTO WITH HYDRODISTENSION N/A 11/30/2012   Procedure: CYSTOSCOPY/HYDRODISTENSION;  Surgeon: Bernestine Amass, MD;  Location: [facility];  Service: Urology;  Laterality: N/A;  . KNEE ARTHROSCOPY Right   . LUMBAR DISC SURGERY  02-02-2007   RIGHT SIDE L5 -- S1  . LUMBAR FUSION  12-04-2008   L4  -- S1  . LYNX RETROPUBIC SUBURETHRAL SLING  03-02-2007  . MEDIASTINOSCOPY N/A  10/26/2014   Procedure: MEDIASTINOSCOPY;  Surgeon: Melrose Nakayama, MD;  Location: [facility];  Service: Thoracic;  Laterality: N/A;  . PELVIC LAPAROSCOPY  1990's   LYSIS ADHESIONS  . PUBOVAGINAL SLING N/A 11/30/2012   Procedure: Gaynelle Arabian;  Surgeon: Bernestine Amass, MD;  Location: [facility];  Service: Urology;  Laterality: N/A;  1 HR EXAM UNDER ANESTHESIA, EXCISION OF SUB URETHRAL MESH, CYSTO, HOD   . TONSILLECTOMY  1986  . TUBAL LIGATION  2001   hulka clip  . VIDEO BRONCHOSCOPY WITH ENDOBRONCHIAL ULTRASOUND N/A 09/21/2014   Procedure: VIDEO BRONCHOSCOPY WITH ENDOBRONCHIAL ULTRASOUND;  Surgeon: Collene Gobble, MD;  Location: [facility];  Service: Thoracic;  Laterality: N/A;     Current Outpatient Prescriptions  Medication Sig Dispense Refill  .  albuterol (PROVENTIL HFA;VENTOLIN HFA) 108 (90 BASE) MCG/ACT inhaler Inhale 2 puffs into the lungs every 6 (six) hours as needed. For shortness of breath (Patient taking differently: Inhale 2 puffs into the lungs every 6 (six) hours as needed for shortness of breath. ) 3.7 g 1  . amLODipine (NORVASC) 5 MG tablet Take 5 mg by mouth daily.    Marland Kitchen aspirin 81 MG EC tablet Take 81 mg by mouth daily. Swallow whole.    Marland Kitchen BREO ELLIPTA 100-25 MCG/INH AEPB INHALE 1 PUFF INTO THE LUNGS DAILY. 60 each 5  . buPROPion (WELLBUTRIN XL) 300 MG 24 hr tablet Take 300 mg by mouth daily. Reported on 08/20/2015  11  . diazepam (VALIUM) 2 MG tablet Take 2 mg by mouth at bedtime.    . dicyclomine (BENTYL) 20 MG tablet Take 20 mg by mouth 2 (two) times daily as needed for spasms.     . folic acid (FOLVITE) 1 MG tablet Take 2 mg by mouth at bedtime.     Marland Kitchen HYDROmorphone (DILAUDID) 4 MG tablet Take 4 mg by mouth 2 (two) times daily as needed for pain.    Marland Kitchen levothyroxine (SYNTHROID, LEVOTHROID) 137 MCG tablet Take 137 mcg by mouth every morning.     . methocarbamol (ROBAXIN) 500 MG tablet Take 1 tablet (500 mg total) by mouth 2 (two) times daily. 20 tablet 0  . methotrexate (50 MG/ML) 1 g injection Inject 30 mg into the vein once a week. Take 0.6 mL weekly SQ    . naproxen (NAPROSYN) 500 MG tablet Take 500 mg by mouth 2 (two) times daily as needed (for pain).     . nitroGLYCERIN (NITROSTAT) 0.4 MG SL tablet Place 0.4 mg under the tongue every 5 (five) minutes as needed for chest pain.     Marland Kitchen omeprazole (PRILOSEC) 40 MG capsule Take 40 mg by mouth at bedtime.    Marland Kitchen PARoxetine (PAXIL) 40 MG tablet Take 40 mg by mouth at bedtime.  3  . predniSONE (DELTASONE) 5 MG tablet Take 1 tablet (5 mg total) by mouth daily with breakfast. 30 tablet 0  . propranolol (INDERAL) 40 MG tablet Take 40 mg by mouth 2 (two) times daily.    Marland Kitchen rOPINIRole (REQUIP) 2 MG tablet Take 2 mg by mouth at bedtime.    . traMADol (ULTRAM) 50 MG tablet Take 1 tablet  (50 mg total) by mouth every 6 (six) hours as needed. (Patient taking differently: Take 50 mg by mouth every 6 (six) hours as needed (for pain or migraines). ) 15 tablet 0  . traZODone (DESYREL) 50 MG tablet Take 50 mg by mouth at bedtime.    . valACYclovir (VALTREX) 500 MG  tablet Take 500 mg by mouth at bedtime.     No current facility-administered medications for this visit.     Allergies:   Bactrim [sulfamethoxazole-trimethoprim]; Percocet [oxycodone-acetaminophen]; and Vicodin [hydrocodone-acetaminophen]    Social History:  The patient  reports that she quit smoking about 2 years ago. Her smoking use included Cigarettes. She has a 7.50 pack-year smoking history. She has never used smokeless tobacco. She reports that she drinks alcohol. She reports that she does not use drugs.   Family History:  The patient's family history includes Cancer in her father, maternal grandfather, and paternal grandfather; Diabetes in her mother; Heart disease in her maternal grandmother, mother, and paternal grandmother; Hypertension in her father and mother.    ROS:  Please see the history of present illness.   Otherwise, review of systems are positive for none.   All other systems are reviewed and negative.    PHYSICAL EXAM: VS:  BP 128/78   Pulse 67   Ht 5\' 6"  (1.676 m)   Wt 178 lb 12.8 oz (81.1 kg)   LMP 06/03/2012   SpO2 96%   BMI 28.86 kg/m  , BMI Body mass index is 28.86 kg/m. Affect appropriate Healthy:  appears stated age HEENT: normal Neck supple with no adenopathy JVP normal no bruits no thyromegaly Lungs clear with no wheezing and good diaphragmatic motion Heart:  S1/S2 no murmur, no rub, gallop or click PMI normal Abdomen: benighn, BS positve, no tenderness, no AAA no bruit.  No HSM or HJR Distal pulses intact with no bruits No edema Neuro non-focal Skin warm and dry No muscular weakness    EKG:  12/02/16  SR rate 56 normal ECG    Recent Labs: 04/28/2016: ALT 15 12/01/2016:  BUN 18; Creatinine, Ser 0.79; Hemoglobin 10.9; Platelets 205; Potassium 4.1; Sodium 139    Lipid Panel    Component Value Date/Time   CHOL 215 (H) 01/07/2015 0818   TRIG 279.0 (H) 01/07/2015 0818   HDL 55.00 01/07/2015 0818   CHOLHDL 4 01/07/2015 0818   VLDL 55.8 (H) 01/07/2015 0818   LDLDIRECT 100.0 01/07/2015 0818      Wt Readings from Last 3 Encounters:  12/15/16 178 lb 12.8 oz (81.1 kg)  12/10/16 179 lb 12.8 oz (81.6 kg)  12/01/16 174 lb (78.9 kg)      Other studies Reviewed: Additional studies/ records that were reviewed today include: Notes from primary and hospitalization in [place] February 2018    ER notes 12/02/16 including CXR, labs and ECg Notes from pulmonary and cardiac MRI see above .    ASSESSMENT AND PLAN:  1.  Chest Pain: appears to have been non cardiac with normal echo , ECG and cath. Minimal elevation in troponin .07 not significant.   2. Cholesterol  Diet Rx no CAD on multiple meds already 3. COPD/Sarcoid:  MRI with no cardiac involvement 11/17/16 CT chest with stable mild bilateral hilar adenopathy f/u pulmonary  4. Chronic pain discussed polypharmacy on multiple psycho- acitve drugs f/u primary to simplify    Current medicines are reviewed at length with the patient today.  The patient does not have concerns regarding medicines.  The following changes have been made:  no change  Labs/ tests ordered today include: None  No orders of the defined types were placed in this encounter.    Disposition:   FU with me in a year      Signed, 11/19/16, MD  12/15/2016 10:02 AM    [facility] [place], [place], [place]  [id number] Phone: [phone]; Fax: [phone]

## 2016-12-14 ENCOUNTER — Ambulatory Visit: Payer: BLUE CROSS/BLUE SHIELD | Admitting: Cardiovascular Disease

## 2016-12-15 ENCOUNTER — Ambulatory Visit (INDEPENDENT_AMBULATORY_CARE_PROVIDER_SITE_OTHER): Payer: BLUE CROSS/BLUE SHIELD | Admitting: Cardiovascular Disease

## 2016-12-15 ENCOUNTER — Encounter: Payer: Self-pay | Admitting: Cardiovascular Disease

## 2016-12-15 VITALS — BP 128/78 | HR 67 | Ht 66.0 in | Wt 178.8 lb

## 2016-12-15 DIAGNOSIS — Z7689 Persons encountering health services in other specified circumstances: Secondary | ICD-10-CM

## 2016-12-15 NOTE — Patient Instructions (Addendum)

## 2017-01-22 DIAGNOSIS — M545 Low back pain: Secondary | ICD-10-CM | POA: Diagnosis not present

## 2017-01-22 DIAGNOSIS — M546 Pain in thoracic spine: Secondary | ICD-10-CM | POA: Diagnosis not present

## 2017-01-22 DIAGNOSIS — M542 Cervicalgia: Secondary | ICD-10-CM | POA: Diagnosis not present

## 2017-01-29 ENCOUNTER — Inpatient Hospital Stay (HOSPITAL_COMMUNITY)
Admission: AD | Admit: 2017-01-29 | Discharge: 2017-01-29 | Disposition: A | Discharge status: Home / Self Care | Payer: BLUE CROSS/BLUE SHIELD | Source: Ambulatory Visit | Attending: Obstetrics and Gynecology | Admitting: Obstetrics and Gynecology

## 2017-01-29 ENCOUNTER — Inpatient Hospital Stay (HOSPITAL_COMMUNITY): Payer: BLUE CROSS/BLUE SHIELD

## 2017-01-29 ENCOUNTER — Telehealth: Payer: Self-pay | Admitting: *Deleted

## 2017-01-29 ENCOUNTER — Encounter (HOSPITAL_COMMUNITY): Payer: Self-pay

## 2017-01-29 DIAGNOSIS — G8929 Other chronic pain: Secondary | ICD-10-CM | POA: Diagnosis not present

## 2017-01-29 DIAGNOSIS — R1032 Left lower quadrant pain: Secondary | ICD-10-CM | POA: Insufficient documentation

## 2017-01-29 DIAGNOSIS — Z87891 Personal history of nicotine dependence: Secondary | ICD-10-CM | POA: Diagnosis not present

## 2017-01-29 DIAGNOSIS — N83202 Unspecified ovarian cyst, left side: Secondary | ICD-10-CM | POA: Diagnosis not present

## 2017-01-29 DIAGNOSIS — R197 Diarrhea, unspecified: Secondary | ICD-10-CM | POA: Diagnosis not present

## 2017-01-29 DIAGNOSIS — Z90710 Acquired absence of both cervix and uterus: Secondary | ICD-10-CM | POA: Insufficient documentation

## 2017-01-29 DIAGNOSIS — E039 Hypothyroidism, unspecified: Secondary | ICD-10-CM | POA: Diagnosis not present

## 2017-01-29 DIAGNOSIS — G2581 Restless legs syndrome: Secondary | ICD-10-CM | POA: Insufficient documentation

## 2017-01-29 DIAGNOSIS — J45909 Unspecified asthma, uncomplicated: Secondary | ICD-10-CM | POA: Insufficient documentation

## 2017-01-29 DIAGNOSIS — R1030 Lower abdominal pain, unspecified: Secondary | ICD-10-CM

## 2017-01-29 DIAGNOSIS — D869 Sarcoidosis, unspecified: Secondary | ICD-10-CM | POA: Insufficient documentation

## 2017-01-29 DIAGNOSIS — Z7982 Long term (current) use of aspirin: Secondary | ICD-10-CM | POA: Diagnosis not present

## 2017-01-29 DIAGNOSIS — K580 Irritable bowel syndrome with diarrhea: Secondary | ICD-10-CM | POA: Diagnosis not present

## 2017-01-29 DIAGNOSIS — F419 Anxiety disorder, unspecified: Secondary | ICD-10-CM | POA: Insufficient documentation

## 2017-01-29 DIAGNOSIS — M1990 Unspecified osteoarthritis, unspecified site: Secondary | ICD-10-CM | POA: Insufficient documentation

## 2017-01-29 DIAGNOSIS — F329 Major depressive disorder, single episode, unspecified: Secondary | ICD-10-CM | POA: Insufficient documentation

## 2017-01-29 DIAGNOSIS — R110 Nausea: Secondary | ICD-10-CM | POA: Diagnosis not present

## 2017-01-29 DIAGNOSIS — K219 Gastro-esophageal reflux disease without esophagitis: Secondary | ICD-10-CM | POA: Diagnosis not present

## 2017-01-29 HISTORY — DX: Unspecified osteoarthritis, unspecified site: M19.90

## 2017-01-29 HISTORY — DX: Ankylosing spondylitis of unspecified sites in spine: M45.9

## 2017-01-29 LAB — CBC WITH DIFFERENTIAL/PLATELET
Basophils Absolute: 0 10*3/uL (ref 0.0–0.1)
Basophils Relative: 0 %
EOS ABS: 0.1 10*3/uL (ref 0.0–0.7)
EOS PCT: 1 %
HCT: 36 % (ref 36.0–46.0)
Hemoglobin: 12.4 g/dL (ref 12.0–15.0)
LYMPHS ABS: 1.3 10*3/uL (ref 0.7–4.0)
Lymphocytes Relative: 16 %
MCH: 30.5 pg (ref 26.0–34.0)
MCHC: 34.4 g/dL (ref 30.0–36.0)
MCV: 88.7 fL (ref 78.0–100.0)
MONO ABS: 0.3 10*3/uL (ref 0.1–1.0)
MONOS PCT: 3 %
Neutro Abs: 6.3 10*3/uL (ref 1.7–7.7)
Neutrophils Relative %: 80 %
PLATELETS: 225 10*3/uL (ref 150–400)
RBC: 4.06 MIL/uL (ref 3.87–5.11)
RDW: 14.8 % (ref 11.5–15.5)
WBC: 8 10*3/uL (ref 4.0–10.5)

## 2017-01-29 LAB — COMPREHENSIVE METABOLIC PANEL
ALT: 18 U/L (ref 14–54)
ANION GAP: 6 (ref 5–15)
AST: 20 U/L (ref 15–41)
Albumin: 4.3 g/dL (ref 3.5–5.0)
Alkaline Phosphatase: 68 U/L (ref 38–126)
BUN: 18 mg/dL (ref 6–20)
CHLORIDE: 108 mmol/L (ref 101–111)
CO2: 24 mmol/L (ref 22–32)
Calcium: 9.1 mg/dL (ref 8.9–10.3)
Creatinine, Ser: 0.9 mg/dL (ref 0.44–1.00)
Glucose, Bld: 113 mg/dL — ABNORMAL HIGH (ref 65–99)
Potassium: 4.3 mmol/L (ref 3.5–5.1)
SODIUM: 138 mmol/L (ref 135–145)
Total Bilirubin: 0.5 mg/dL (ref 0.3–1.2)
Total Protein: 7.1 g/dL (ref 6.5–8.1)

## 2017-01-29 MED ORDER — ONDANSETRON HCL 4 MG PO TABS: 4.0000 mg | ORAL_TABLET | Freq: Three times a day (TID) | ORAL | 0 refills | Status: AC | PRN

## 2017-01-29 MED ORDER — HYDROMORPHONE HCL 1 MG/ML IJ SOLN: 1.0000 mg | Freq: Once | INTRAMUSCULAR | Status: DC

## 2017-01-29 MED ORDER — HYDROMORPHONE HCL 1 MG/ML IJ SOLN
0.5000 mg | Freq: Once | INTRAMUSCULAR | Status: AC
  Administered 2017-01-29: 0.5 mg via INTRAMUSCULAR
  Filled 2017-01-29: qty 1

## 2017-01-29 NOTE — MAU Note (Addendum)
Pt reports having had pain for a week in her L groin that radiates to her back which she says has worsened.  She has Hx of sarcoidosis. She took dilaudid for pain today at noon. States the diaudid eased the pain, but did not relieve the majority of it. Pt also reports left leg weakness with this groin pain.

## 2017-01-29 NOTE — Discharge Instructions (Signed)

## 2017-01-29 NOTE — Telephone Encounter (Signed)
Pt called c/o left side lower quad pain x 1 week now, pain radiates down left lower leg, pt said yesterday pain was so bad she couldn't walk. Pt last seen in 2014, rates pain level 8, taking naproxen for pain which help some. I explained to patient best to go to [facility] hospital for possible imaging. I explained that our ultrasound is closed to day, pt verbalized [facility]. Pt said she will call to schedule annual exam as well

## 2017-01-29 NOTE — MAU Note (Signed)
Urine in lab 

## 2017-01-29 NOTE — MAU Note (Signed)
Really bad pain in left groin for the past wk.  Pain is affecting the way she walks, causing back ache.  Can feel a lump there.  Has had diarrhea, started 3 days ago.  Has gone 3 times today - loose, not watery.

## 2017-01-29 NOTE — MAU Provider Note (Signed)
Chief Complaint:  Groin Pain   First Provider Initiated Contact with Patient 01/29/17 1650       HPI: [name] is a 51 y.o. G4P4 who presents to maternity admissions reporting left lower abdomen pain and more specifically, left groin pain.  States the pain radiates to back and down leg. Does have a history of sarcoid.  Has chronic pain, for which she takes Dilaudid. She reports vaginal bleeding, vaginal itching/burning, urinary symptoms, h/a, dizziness, n/v, or fever/chills.    Had a hysterectomy in 2005 but states still has ovaries.  Has had an ovarian cyst on left.  STates Dr Sandrea Hughs office sent her here.   Groin Pain  The patient's primary symptoms include pelvic pain. The patient's pertinent negatives include no genital itching, genital lesions, genital odor, vaginal bleeding or vaginal discharge. This is a recurrent problem. The current episode started in the past 7 days. The problem occurs constantly. The problem has been gradually worsening. The pain is severe. The problem affects the left side. She is not pregnant. Associated symptoms include abdominal pain, back pain, diarrhea and nausea. Pertinent negatives include no chills, constipation, dysuria, fever, frequency, headaches or vomiting. The symptoms are aggravated by tactile pressure and activity. She has tried oral narcotics for the symptoms. The treatment provided mild relief.    RN Note: Pt reports having had pain for a week in her L groin that radiates to her back which she says has worsened.  She has Hx of sarcoidosis. She took dilaudid for pain today at noon. States the diaudid eased the pain, but did not relieve the majority of it. Pt also reports left leg weakness with this groin pain.  Past Medical History: Past Medical History:  Diagnosis Date  . Ankylosing spondylitis ([place])   . Anxiety   . Arthritis    "back, hands, neck" (09/18/2014)  . Asthma   . Chronic back pain    "whole spine"  . Chronic bronchitis  ([place])    "get it close to q yr" (09/18/2014)  . Depression   . Environmental allergies   . Erosion of suburethral sling ([place])   . Frequency of urination   . GERD (gastroesophageal reflux disease)   . Headache    "at least several times/wk" (09/18/2014)  . Heart murmur    "when I was a baby"  . Herpes simplex type 2 infection   . HLA B27 positive   . Hypothyroidism   . IBS (irritable bowel syndrome)   . IC (interstitial cystitis)   . Migraine    "maybe once/month" (09/18/2014)  . Nocturia   . NSVD (normal spontaneous vaginal delivery)    x3  . Osteoarthritis   . RLS (restless legs syndrome)   . Sarcoid   . Seizures ([place])    "when I was a teenager"  . Shortness of breath dyspnea    Exertion  . SUI (stress urinary incontinence, female)     Past obstetric history: OB History  Gravida Para Term Preterm AB Living  _0 SAB TAB Ectopic Multiple Live Births               # Outcome Date GA Lbr Len/2nd Weight Sex Delivery Anes PTL Lv  4 Para           3 Para           2 Para           1 Para  Past Surgical History: Past Surgical History:  Procedure Laterality Date  . ABDOMINAL HYSTERECTOMY  2006  . ANTERIOR CERVICAL DECOMP/DISCECTOMY FUSION  2011  . APPENDECTOMY  1985  . ARTHROPLASTY     Left thumb  . BACK SURGERY  2008   X 2 in 2010  . CESAREAN SECTION  2000  . CRANIECTOMY FOR DEPRESSED SKULL FRACTURE Left 12/05/2014   Procedure: Left frontal stereotactic craniectomy for biopsy of skull lesion;  Surgeon: Consuella Lose, MD;  Location: [facility];  Service: Neurosurgery;  Laterality: Left;  Left frontal stereotactic craniectomy for biopsy of skull lesion  . CYSTO WITH HYDRODISTENSION N/A 11/30/2012   Procedure: CYSTOSCOPY/HYDRODISTENSION;  Surgeon: Bernestine Amass, MD;  Location: [facility];  Service: Urology;  Laterality: N/A;  . KNEE ARTHROSCOPY Right   . LUMBAR DISC SURGERY  02-02-2007   RIGHT SIDE L5 -- S1  . LUMBAR FUSION   12-04-2008   L4  -- S1  . LYNX RETROPUBIC SUBURETHRAL SLING  03-02-2007  . MEDIASTINOSCOPY N/A 10/26/2014   Procedure: MEDIASTINOSCOPY;  Surgeon: Melrose Nakayama, MD;  Location: [facility];  Service: Thoracic;  Laterality: N/A;  . PELVIC LAPAROSCOPY  1990's   LYSIS ADHESIONS  . PUBOVAGINAL SLING N/A 11/30/2012   Procedure: Gaynelle Arabian;  Surgeon: Bernestine Amass, MD;  Location: [facility];  Service: Urology;  Laterality: N/A;  1 HR EXAM UNDER ANESTHESIA, EXCISION OF SUB URETHRAL MESH, CYSTO, HOD   . TONSILLECTOMY  1986  . TUBAL LIGATION  2001   hulka clip  . VIDEO BRONCHOSCOPY WITH ENDOBRONCHIAL ULTRASOUND N/A 09/21/2014   Procedure: VIDEO BRONCHOSCOPY WITH ENDOBRONCHIAL ULTRASOUND;  Surgeon: Collene Gobble, MD;  Location: [facility];  Service: Thoracic;  Laterality: N/A;    Family History: Family History  Problem Relation Age of Onset  . Diabetes Mother   . Hypertension Mother   . Heart disease Mother   . Cancer Father        LUNG AND BRAIN  . Hypertension Father   . Heart disease Maternal Grandmother   . Cancer Maternal Grandfather        LUNG  . Heart disease Paternal Grandmother   . Cancer Paternal Grandfather     Social History: Social History  Substance Use Topics  . Smoking status: Former Smoker    Packs/day: 0.50    Years: 15.00    Types: Cigarettes    Quit date: 01/15/2014  . Smokeless tobacco: Never Used  . Alcohol use 0.0 oz/week     Comment: few classes a week    Allergies:  Allergies  Allergen Reactions  . Bactrim [Sulfamethoxazole-Trimethoprim] Other (See Comments)    Fever   . Percocet [Oxycodone-Acetaminophen] Itching  . Vicodin [Hydrocodone-Acetaminophen] Itching    Meds:  Prescriptions Prior to Admission  Medication Sig Dispense Refill Last Dose  . albuterol (PROVENTIL HFA;VENTOLIN HFA) 108 (90 BASE) MCG/ACT inhaler Inhale 2 puffs into the lungs every 6 (six) hours as needed. For shortness of breath (Patient taking differently:  Inhale 2 puffs into the lungs every 6 (six) hours as needed for shortness of breath. ) 3.7 g 1 Taking  . amLODipine (NORVASC) 5 MG tablet Take 5 mg by mouth daily.   Taking  . aspirin 81 MG EC tablet Take 81 mg by mouth daily. Swallow whole.   Taking  . BREO ELLIPTA 100-25 MCG/INH AEPB INHALE 1 PUFF INTO THE LUNGS DAILY. 60 each 5 Taking  . buPROPion (WELLBUTRIN XL) 300 MG 24 hr tablet  Take 300 mg by mouth daily. Reported on 08/20/2015  11 Taking  . diazepam (VALIUM) 2 MG tablet Take 2 mg by mouth at bedtime.   Taking  . dicyclomine (BENTYL) 20 MG tablet Take 20 mg by mouth 2 (two) times daily as needed for spasms.    Taking  . folic acid (FOLVITE) 1 MG tablet Take 2 mg by mouth at bedtime.    Taking  . HYDROmorphone (DILAUDID) 4 MG tablet Take 4 mg by mouth 2 (two) times daily as needed for pain.   Taking  . levothyroxine (SYNTHROID, LEVOTHROID) 137 MCG tablet Take 137 mcg by mouth every morning.    Taking  . methocarbamol (ROBAXIN) 500 MG tablet Take 1 tablet (500 mg total) by mouth 2 (two) times daily. 20 tablet 0 Taking  . methotrexate (50 MG/ML) 1 g injection Inject 30 mg into the vein once a week. Take 0.6 mL weekly SQ   Taking  . naproxen (NAPROSYN) 500 MG tablet Take 500 mg by mouth 2 (two) times daily as needed (for pain).    Taking  . nitroGLYCERIN (NITROSTAT) 0.4 MG SL tablet Place 0.4 mg under the tongue every 5 (five) minutes as needed for chest pain.    Taking  . omeprazole (PRILOSEC) 40 MG capsule Take 40 mg by mouth at bedtime.   Taking  . PARoxetine (PAXIL) 40 MG tablet Take 40 mg by mouth at bedtime.  3 Taking  . predniSONE (DELTASONE) 5 MG tablet Take 1 tablet (5 mg total) by mouth daily with breakfast. 30 tablet 0 Taking  . propranolol (INDERAL) 40 MG tablet Take 40 mg by mouth 2 (two) times daily.   Taking  . rOPINIRole (REQUIP) 2 MG tablet Take 2 mg by mouth at bedtime.   Taking  . traMADol (ULTRAM) 50 MG tablet Take 1 tablet (50 mg total) by mouth every 6 (six) hours as  needed. (Patient taking differently: Take 50 mg by mouth every 6 (six) hours as needed (for pain or migraines). ) 15 tablet 0 Taking  . traZODone (DESYREL) 50 MG tablet Take 50 mg by mouth at bedtime.   Taking  . valACYclovir (VALTREX) 500 MG tablet Take 500 mg by mouth at bedtime.   Taking    I have reviewed patient's Past Medical Hx, Surgical Hx, Family Hx, Social Hx, medications and allergies.  ROS:  Review of Systems  Constitutional: Negative for chills and fever.  Gastrointestinal: Positive for abdominal pain, diarrhea and nausea. Negative for constipation and vomiting.  Genitourinary: Positive for pelvic pain. Negative for dysuria, frequency and vaginal discharge.  Musculoskeletal: Positive for back pain.  Neurological: Negative for headaches.   Other systems negative     Physical Exam  Patient Vitals for the past 24 hrs:  BP Temp Temp src Pulse Resp SpO2 Height Weight  01/29/17 1616 134/78 98.1 F (36.7 C) Oral 73 18 98 % _0  (1.676 m) 178 lb (80.7 kg)  01/29/17 1553 (!) 132/91 97.7 F (36.5 C) Oral 69 18 93 % - -   Constitutional: Well-developed, well-nourished female in no acute distress, but uncomfortable.  Cardiovascular: normal rate and rhythm Respiratory: normal effort, no distress.  GI: Abd soft, non-tender.  Nondistended.  No rebound, No guarding.  MS: Extremities nontender, no edema, normal ROM Neurologic: Alert and oriented x 4.   Grossly nonfocal. GU: Neg CVAT.   Left groin was quite tender.  Pt states she feels a lymph node there but I do not appreciate it Skin:  Warm and Dry Psych:  Affect appropriate.  PELVIC EXAM: Cervix surgically absent, scant white creamy discharge, vaginal walls and external genitalia normal Bimanual exam: adnexa without tenderness, enlargement, or mass on right.   Left adnexa tender.  Uterus and cervix surgically absent    Labs: Results for orders placed or performed during the hospital encounter of 01/29/17 (from the past 24  hour(s))  CBC with Differential/Platelet     Status: None   Collection Time: 01/29/17  5:22 PM  Result Value Ref Range   WBC 8.0 4.0 - 10.5 K/uL   RBC 4.06 3.87 - 5.11 MIL/uL   Hemoglobin 12.4 12.0 - 15.0 g/dL   HCT 36.0 36.0 - 46.0 %   MCV 88.7 78.0 - 100.0 fL   MCH 30.5 26.0 - 34.0 pg   MCHC 34.4 30.0 - 36.0 g/dL   RDW 14.8 11.5 - 15.5 %   Platelets 225 150 - 400 K/uL   Neutrophils Relative % 80 %   Neutro Abs 6.3 1.7 - 7.7 K/uL   Lymphocytes Relative 16 %   Lymphs Abs 1.3 0.7 - 4.0 K/uL   Monocytes Relative 3 %   Monocytes Absolute 0.3 0.1 - 1.0 K/uL   Eosinophils Relative 1 %   Eosinophils Absolute 0.1 0.0 - 0.7 K/uL   Basophils Relative 0 %   Basophils Absolute 0.0 0.0 - 0.1 K/uL  Comprehensive metabolic panel     Status: Abnormal   Collection Time: 01/29/17  5:22 PM  Result Value Ref Range   Sodium 138 135 - 145 mmol/L   Potassium 4.3 3.5 - 5.1 mmol/L   Chloride 108 101 - 111 mmol/L   CO2 24 22 - 32 mmol/L   Glucose, Bld 113 (H) 65 - 99 mg/dL   BUN 18 6 - 20 mg/dL   Creatinine, Ser 0.90 0.44 - 1.00 mg/dL   Calcium 9.1 8.9 - 10.3 mg/dL   Total Protein 7.1 6.5 - 8.1 g/dL   Albumin 4.3 3.5 - 5.0 g/dL   AST 20 15 - 41 U/L   ALT 18 14 - 54 U/L   Alkaline Phosphatase 68 38 - 126 U/L   Total Bilirubin 0.5 0.3 - 1.2 mg/dL   GFR calc non Af Amer >60 >60 mL/min   GFR calc Af Amer >60 >60 mL/min   Anion gap 6 5 - 15    Imaging:   MAU Course/MDM: I have ordered labs as follows:  CBC and CMET.   Imaging ordered: Pelvic US per Dr Quincy Simmonds Results reviewed. Labs are normal.  WBC is normal.    Consult Dr Quincy Simmonds with presentation, exam findings.  She recommends pelvic US and labs. .   Treatments in MAU included half dose of Dilaudid.   Pt stable at time of discharge.  Assessment: Severe left groin pain - Plan: Discharge patient  Diarrhea, unspecified type - Plan: Discharge patient  Nausea - Plan: Discharge patient    Plan: Discharge home Recommend follow up with  primary doctor. She plans to call Dr Toney Rakes for an annual exam.  Encouraged to return here or to other Urgent Care/ED if she develops worsening of symptoms, increase in pain, fever, or other concerning symptoms.   Hansel Feinstein CNM, MSN Certified Nurse-Midwife 01/29/2017 4:50 PM

## 2017-02-02 DIAGNOSIS — M79642 Pain in left hand: Secondary | ICD-10-CM | POA: Diagnosis not present

## 2017-02-02 DIAGNOSIS — M1811 Unilateral primary osteoarthritis of first carpometacarpal joint, right hand: Secondary | ICD-10-CM | POA: Diagnosis not present

## 2017-02-02 DIAGNOSIS — M79641 Pain in right hand: Secondary | ICD-10-CM | POA: Diagnosis not present

## 2017-02-09 DIAGNOSIS — M5412 Radiculopathy, cervical region: Secondary | ICD-10-CM | POA: Diagnosis not present

## 2017-02-09 DIAGNOSIS — M1811 Unilateral primary osteoarthritis of first carpometacarpal joint, right hand: Secondary | ICD-10-CM | POA: Diagnosis not present

## 2017-02-09 DIAGNOSIS — M79642 Pain in left hand: Secondary | ICD-10-CM | POA: Diagnosis not present

## 2017-02-09 DIAGNOSIS — G8929 Other chronic pain: Secondary | ICD-10-CM | POA: Diagnosis not present

## 2017-02-09 DIAGNOSIS — M79641 Pain in right hand: Secondary | ICD-10-CM | POA: Diagnosis not present

## 2017-02-09 DIAGNOSIS — M5414 Radiculopathy, thoracic region: Secondary | ICD-10-CM | POA: Diagnosis not present

## 2017-02-15 ENCOUNTER — Ambulatory Visit: Payer: BLUE CROSS/BLUE SHIELD | Admitting: Pulmonary Disease

## 2017-02-17 DIAGNOSIS — M545 Low back pain: Secondary | ICD-10-CM | POA: Diagnosis not present

## 2017-02-17 DIAGNOSIS — G8929 Other chronic pain: Secondary | ICD-10-CM | POA: Diagnosis not present

## 2017-02-17 DIAGNOSIS — Z6826 Body mass index (BMI) 26.0-26.9, adult: Secondary | ICD-10-CM | POA: Diagnosis not present

## 2017-02-23 DIAGNOSIS — M150 Primary generalized (osteo)arthritis: Secondary | ICD-10-CM | POA: Diagnosis not present

## 2017-02-23 DIAGNOSIS — Z1589 Genetic susceptibility to other disease: Secondary | ICD-10-CM | POA: Diagnosis not present

## 2017-02-23 DIAGNOSIS — M0609 Rheumatoid arthritis without rheumatoid factor, multiple sites: Secondary | ICD-10-CM | POA: Diagnosis not present

## 2017-02-23 DIAGNOSIS — D8689 Sarcoidosis of other sites: Secondary | ICD-10-CM | POA: Diagnosis not present

## 2017-03-02 DIAGNOSIS — G894 Chronic pain syndrome: Secondary | ICD-10-CM | POA: Diagnosis not present

## 2017-03-09 DIAGNOSIS — M6740 Ganglion, unspecified site: Secondary | ICD-10-CM | POA: Diagnosis not present

## 2017-03-10 ENCOUNTER — Inpatient Hospital Stay (HOSPITAL_COMMUNITY)
Admission: EM | Admit: 2017-03-10 | Discharge: 2017-03-13 | DRG: 103 | Disposition: A | Discharge status: Home / Self Care | Payer: BLUE CROSS/BLUE SHIELD | Attending: Internal Medicine | Admitting: Internal Medicine

## 2017-03-10 ENCOUNTER — Encounter (HOSPITAL_COMMUNITY): Payer: Self-pay | Admitting: Emergency Medicine

## 2017-03-10 ENCOUNTER — Emergency Department (HOSPITAL_COMMUNITY): Payer: BLUE CROSS/BLUE SHIELD

## 2017-03-10 DIAGNOSIS — G8929 Other chronic pain: Secondary | ICD-10-CM | POA: Diagnosis present

## 2017-03-10 DIAGNOSIS — N3010 Interstitial cystitis (chronic) without hematuria: Secondary | ICD-10-CM | POA: Diagnosis present

## 2017-03-10 DIAGNOSIS — G8194 Hemiplegia, unspecified affecting left nondominant side: Secondary | ICD-10-CM | POA: Diagnosis present

## 2017-03-10 DIAGNOSIS — I10 Essential (primary) hypertension: Secondary | ICD-10-CM | POA: Diagnosis present

## 2017-03-10 DIAGNOSIS — Z87891 Personal history of nicotine dependence: Secondary | ICD-10-CM

## 2017-03-10 DIAGNOSIS — E039 Hypothyroidism, unspecified: Secondary | ICD-10-CM | POA: Diagnosis not present

## 2017-03-10 DIAGNOSIS — E66811 Obesity, class 1: Secondary | ICD-10-CM

## 2017-03-10 DIAGNOSIS — D869 Sarcoidosis, unspecified: Secondary | ICD-10-CM | POA: Diagnosis not present

## 2017-03-10 DIAGNOSIS — E6609 Other obesity due to excess calories: Secondary | ICD-10-CM | POA: Diagnosis not present

## 2017-03-10 DIAGNOSIS — Z801 Family history of malignant neoplasm of trachea, bronchus and lung: Secondary | ICD-10-CM

## 2017-03-10 DIAGNOSIS — H53149 Visual discomfort, unspecified: Secondary | ICD-10-CM | POA: Diagnosis not present

## 2017-03-10 DIAGNOSIS — R262 Difficulty in walking, not elsewhere classified: Secondary | ICD-10-CM | POA: Diagnosis not present

## 2017-03-10 DIAGNOSIS — K219 Gastro-esophageal reflux disease without esophagitis: Secondary | ICD-10-CM | POA: Diagnosis present

## 2017-03-10 DIAGNOSIS — E162 Hypoglycemia, unspecified: Secondary | ICD-10-CM | POA: Diagnosis not present

## 2017-03-10 DIAGNOSIS — F329 Major depressive disorder, single episode, unspecified: Secondary | ICD-10-CM | POA: Diagnosis present

## 2017-03-10 DIAGNOSIS — R519 Headache, unspecified: Secondary | ICD-10-CM

## 2017-03-10 DIAGNOSIS — R739 Hyperglycemia, unspecified: Secondary | ICD-10-CM | POA: Diagnosis present

## 2017-03-10 DIAGNOSIS — R4701 Aphasia: Secondary | ICD-10-CM | POA: Diagnosis not present

## 2017-03-10 DIAGNOSIS — R200 Anesthesia of skin: Secondary | ICD-10-CM | POA: Diagnosis present

## 2017-03-10 DIAGNOSIS — J449 Chronic obstructive pulmonary disease, unspecified: Secondary | ICD-10-CM | POA: Diagnosis present

## 2017-03-10 DIAGNOSIS — R202 Paresthesia of skin: Secondary | ICD-10-CM | POA: Diagnosis not present

## 2017-03-10 DIAGNOSIS — G2581 Restless legs syndrome: Secondary | ICD-10-CM | POA: Diagnosis present

## 2017-03-10 DIAGNOSIS — R2990 Unspecified symptoms and signs involving the nervous system: Secondary | ICD-10-CM | POA: Diagnosis present

## 2017-03-10 DIAGNOSIS — G43109 Migraine with aura, not intractable, without status migrainosus: Secondary | ICD-10-CM | POA: Diagnosis not present

## 2017-03-10 DIAGNOSIS — Z7982 Long term (current) use of aspirin: Secondary | ICD-10-CM

## 2017-03-10 DIAGNOSIS — Z8249 Family history of ischemic heart disease and other diseases of the circulatory system: Secondary | ICD-10-CM

## 2017-03-10 DIAGNOSIS — Z7952 Long term (current) use of systemic steroids: Secondary | ICD-10-CM

## 2017-03-10 DIAGNOSIS — Z885 Allergy status to narcotic agent status: Secondary | ICD-10-CM | POA: Diagnosis not present

## 2017-03-10 DIAGNOSIS — Z6832 Body mass index (BMI) 32.0-32.9, adult: Secondary | ICD-10-CM | POA: Diagnosis not present

## 2017-03-10 DIAGNOSIS — Z833 Family history of diabetes mellitus: Secondary | ICD-10-CM

## 2017-03-10 DIAGNOSIS — R1310 Dysphagia, unspecified: Secondary | ICD-10-CM | POA: Diagnosis present

## 2017-03-10 DIAGNOSIS — R9431 Abnormal electrocardiogram [ECG] [EKG]: Secondary | ICD-10-CM | POA: Diagnosis not present

## 2017-03-10 DIAGNOSIS — D8689 Sarcoidosis of other sites: Secondary | ICD-10-CM | POA: Diagnosis not present

## 2017-03-10 DIAGNOSIS — R51 Headache: Secondary | ICD-10-CM | POA: Diagnosis not present

## 2017-03-10 DIAGNOSIS — G894 Chronic pain syndrome: Secondary | ICD-10-CM | POA: Diagnosis present

## 2017-03-10 DIAGNOSIS — Z981 Arthrodesis status: Secondary | ICD-10-CM | POA: Diagnosis not present

## 2017-03-10 DIAGNOSIS — K589 Irritable bowel syndrome without diarrhea: Secondary | ICD-10-CM | POA: Diagnosis present

## 2017-03-10 DIAGNOSIS — T380X5A Adverse effect of glucocorticoids and synthetic analogues, initial encounter: Secondary | ICD-10-CM | POA: Diagnosis present

## 2017-03-10 DIAGNOSIS — T8189XA Other complications of procedures, not elsewhere classified, initial encounter: Secondary | ICD-10-CM | POA: Diagnosis not present

## 2017-03-10 DIAGNOSIS — F32A Depression, unspecified: Secondary | ICD-10-CM | POA: Diagnosis present

## 2017-03-10 LAB — DIFFERENTIAL
Basophils Absolute: 0 10*3/uL (ref 0.0–0.1)
Basophils Relative: 0 %
EOS PCT: 0 %
Eosinophils Absolute: 0 10*3/uL (ref 0.0–0.7)
LYMPHS ABS: 1 10*3/uL (ref 0.7–4.0)
Lymphocytes Relative: 8 %
Monocytes Absolute: 0.4 10*3/uL (ref 0.1–1.0)
Monocytes Relative: 3 %
Neutro Abs: 10.9 10*3/uL — ABNORMAL HIGH (ref 1.7–7.7)
Neutrophils Relative %: 89 %

## 2017-03-10 LAB — CBC
HEMATOCRIT: 38.4 % (ref 36.0–46.0)
Hemoglobin: 13.1 g/dL (ref 12.0–15.0)
MCH: 30.1 pg (ref 26.0–34.0)
MCHC: 34.1 g/dL (ref 30.0–36.0)
MCV: 88.3 fL (ref 78.0–100.0)
Platelets: 250 10*3/uL (ref 150–400)
RBC: 4.35 MIL/uL (ref 3.87–5.11)
RDW: 14.7 % (ref 11.5–15.5)
WBC: 12.4 10*3/uL — ABNORMAL HIGH (ref 4.0–10.5)

## 2017-03-10 LAB — I-STAT CHEM 8, ED
BUN: 16 mg/dL (ref 6–20)
Calcium, Ion: 1.18 mmol/L (ref 1.15–1.40)
Chloride: 104 mmol/L (ref 101–111)
Creatinine, Ser: 1.1 mg/dL — ABNORMAL HIGH (ref 0.44–1.00)
Glucose, Bld: 159 mg/dL — ABNORMAL HIGH (ref 65–99)
HEMATOCRIT: 40 % (ref 36.0–46.0)
HEMOGLOBIN: 13.6 g/dL (ref 12.0–15.0)
POTASSIUM: 4.7 mmol/L (ref 3.5–5.1)
SODIUM: 140 mmol/L (ref 135–145)
TCO2: 26 mmol/L (ref 0–100)

## 2017-03-10 LAB — COMPREHENSIVE METABOLIC PANEL
ALBUMIN: 4.3 g/dL (ref 3.5–5.0)
ALK PHOS: 74 U/L (ref 38–126)
ALT: 16 U/L (ref 14–54)
ANION GAP: 7 (ref 5–15)
AST: 19 U/L (ref 15–41)
BILIRUBIN TOTAL: 0.3 mg/dL (ref 0.3–1.2)
BUN: 16 mg/dL (ref 6–20)
CALCIUM: 9.1 mg/dL (ref 8.9–10.3)
CO2: 26 mmol/L (ref 22–32)
Chloride: 107 mmol/L (ref 101–111)
Creatinine, Ser: 1.13 mg/dL — ABNORMAL HIGH (ref 0.44–1.00)
GFR calc Af Amer: >60 mL/min (ref >60)
GFR calc non Af Amer: 56 mL/min — ABNORMAL LOW (ref >60)
GLUCOSE: 169 mg/dL — AB (ref 65–99)
Potassium: 4.7 mmol/L (ref 3.5–5.1)
SODIUM: 140 mmol/L (ref 135–145)
TOTAL PROTEIN: 7 g/dL (ref 6.5–8.1)

## 2017-03-10 LAB — PROTIME-INR
INR: 1.01
Prothrombin Time: 13.3 seconds (ref 11.4–15.2)

## 2017-03-10 LAB — CBG MONITORING, ED: GLUCOSE-CAPILLARY: 159 mg/dL — AB (ref 65–99)

## 2017-03-10 LAB — I-STAT TROPONIN, ED: Troponin i, poc: 0 ng/mL (ref 0.00–0.08)

## 2017-03-10 LAB — APTT: aPTT: 27 seconds (ref 24–36)

## 2017-03-10 MED ORDER — GADOBENATE DIMEGLUMINE 529 MG/ML IV SOLN
15.0000 mL | Freq: Once | INTRAVENOUS | Status: AC | PRN
  Administered 2017-03-10: 15 mL via INTRAVENOUS

## 2017-03-10 MED ORDER — DIPHENHYDRAMINE HCL 50 MG/ML IJ SOLN
12.5000 mg | Freq: Once | INTRAMUSCULAR | Status: AC
  Administered 2017-03-10: 12.5 mg via INTRAVENOUS
  Filled 2017-03-10: qty 1

## 2017-03-10 MED ORDER — METOCLOPRAMIDE HCL 5 MG/ML IJ SOLN
10.0000 mg | Freq: Once | INTRAMUSCULAR | Status: AC
  Administered 2017-03-10: 10 mg via INTRAVENOUS
  Filled 2017-03-10: qty 2

## 2017-03-10 NOTE — ED Provider Notes (Signed)
[facility] DEPT Provider Note   CSN: [id number] Arrival date & time: 03/10/17  2029     History   Chief Complaint Chief Complaint  Patient presents with  . Numbness  . Aphasia    HPI [name] is a 51 y.o. female.  51 year old female who says that for the past 2 weeks she has had intermittent left-sided facial paresthesias with associated headache and trouble understanding words. She also states that she has trouble walking and has had to use a cane for the past 2 weeks. Denies any bowel or bladder dysfunction. Denies any neck pain. Does have history of migraines and states that those symptoms are usually on the right side. Denies any eye pain or visual changes. No photophobia. No fever or vomiting. Symptoms have been progressively worse with certain how to the visit today.      Past Medical History:  Diagnosis Date  . Ankylosing spondylitis ([place])   . Anxiety   . Arthritis    "back, hands, neck" (09/18/2014)  . Asthma   . Chronic back pain    "whole spine"  . Chronic bronchitis ([employer])    "get it close to q yr" (09/18/2014)  . Depression   . Environmental allergies   . Erosion of suburethral sling ([place])   . Frequency of urination   . GERD (gastroesophageal reflux disease)   . Headache    "at least several times/wk" (09/18/2014)  . Heart murmur    "when I was a baby"  . Herpes simplex type 2 infection   . HLA B27 positive   . Hypothyroidism   . IBS (irritable bowel syndrome)   . IC (interstitial cystitis)   . Migraine    "maybe once/month" (09/18/2014)  . Nocturia   . NSVD (normal spontaneous vaginal delivery)    x3  . Osteoarthritis   . RLS (restless legs syndrome)   . Sarcoid   . Seizures ([place])    "when I was a teenager"  . Shortness of breath dyspnea    Exertion  . SUI (stress urinary incontinence, female)     Patient Active Problem List   Diagnosis Date Noted  . Palpitations 11/04/2016  . Near syncope 11/04/2016  . Dyspnea 11/04/2016  .  Headache 11/14/2014  . Sarcoidosis 11/12/2014  . Left-sided weakness   . Left hemiparesis ([place]) 09/18/2014  . Weakness 09/18/2014  . Frontal skull lesion 09/18/2014  . Atypical chest pain 09/18/2014  . Extrinsic asthma 04/25/2013  . Weight gain 01/12/2013  . Erosion of graft 11/16/2012    Past Surgical History:  Procedure Laterality Date  . ABDOMINAL HYSTERECTOMY  2006  . ANTERIOR CERVICAL DECOMP/DISCECTOMY FUSION  2011  . APPENDECTOMY  1985  . ARTHROPLASTY     Left thumb  . BACK SURGERY  2008   X 2 in 2010  . CESAREAN SECTION  2000  . CRANIECTOMY FOR DEPRESSED SKULL FRACTURE Left 12/05/2014   Procedure: Left frontal stereotactic craniectomy for biopsy of skull lesion;  Surgeon: Consuella Lose, MD;  Location: [facility];  Service: Neurosurgery;  Laterality: Left;  Left frontal stereotactic craniectomy for biopsy of skull lesion  . CYSTO WITH HYDRODISTENSION N/A 11/30/2012   Procedure: CYSTOSCOPY/HYDRODISTENSION;  Surgeon: Bernestine Amass, MD;  Location: [facility];  Service: Urology;  Laterality: N/A;  . KNEE ARTHROSCOPY Right   . LUMBAR DISC SURGERY  02-02-2007   RIGHT SIDE L5 -- S1  . LUMBAR FUSION  12-04-2008   L4  -- S1  .  LYNX RETROPUBIC SUBURETHRAL SLING  03-02-2007  . MEDIASTINOSCOPY N/A 10/26/2014   Procedure: MEDIASTINOSCOPY;  Surgeon: Melrose Nakayama, MD;  Location: [facility];  Service: Thoracic;  Laterality: N/A;  . PELVIC LAPAROSCOPY  1990's   LYSIS ADHESIONS  . PUBOVAGINAL SLING N/A 11/30/2012   Procedure: Gaynelle Arabian;  Surgeon: Bernestine Amass, MD;  Location: [facility];  Service: Urology;  Laterality: N/A;  1 HR EXAM UNDER ANESTHESIA, EXCISION OF SUB URETHRAL MESH, CYSTO, HOD   . TONSILLECTOMY  1986  . TUBAL LIGATION  2001   hulka clip  . VIDEO BRONCHOSCOPY WITH ENDOBRONCHIAL ULTRASOUND N/A 09/21/2014   Procedure: VIDEO BRONCHOSCOPY WITH ENDOBRONCHIAL ULTRASOUND;  Surgeon: Collene Gobble, MD;  Location: [facility];  Service:  Thoracic;  Laterality: N/A;    OB History    Gravida Para Term Preterm AB Living   _0 SAB TAB Ectopic Multiple Live Births                   Home Medications    Prior to Admission medications   Medication Sig Start Date End Date Taking? Authorizing Provider  albuterol (PROVENTIL HFA;VENTOLIN HFA) 108 (90 BASE) MCG/ACT inhaler Inhale 2 puffs into the lungs every 6 (six) hours as needed. For shortness of breath Patient taking differently: Inhale 2 puffs into the lungs every 6 (six) hours as needed for shortness of breath.  04/25/13  Yes Elayne Snare, MD  amLODipine (NORVASC) 5 MG tablet Take 5 mg by mouth at bedtime.    Yes [provider]  BREO ELLIPTA 100-25 MCG/INH AEPB INHALE 1 PUFF INTO THE LUNGS DAILY. 08/20/16  Yes Chesley Mires, MD  buPROPion (WELLBUTRIN XL) 300 MG 24 hr tablet Take 300 mg by mouth at bedtime. Reported on 08/20/2015 01/15/15  Yes [provider]  diazepam (VALIUM) 2 MG tablet Take 2 mg by mouth at bedtime.   Yes [provider]  HYDROmorphone (DILAUDID) 4 MG tablet Take 4 mg by mouth 2 (two) times daily as needed for pain. 11/16/16  Yes [provider]  levothyroxine (SYNTHROID, LEVOTHROID) 137 MCG tablet Take 137 mcg by mouth every morning.    Yes [provider]  naproxen (NAPROSYN) 500 MG tablet Take 500 mg by mouth 2 (two) times daily as needed (for pain).    Yes [provider]  omeprazole (PRILOSEC) 40 MG capsule Take 40 mg by mouth at bedtime.   Yes [provider]  ondansetron (ZOFRAN) 4 MG tablet Take 1 tablet (4 mg total) by mouth every 8 (eight) hours as needed for nausea or vomiting. 01/29/17  Yes Seabron Spates, CNM  PARoxetine (PAXIL) 40 MG tablet Take 40 mg by mouth at bedtime. 11/29/15  Yes [provider]  predniSONE (DELTASONE) 20 MG tablet TAKE 2 TABS DAILY X 7 DAYS, THEN 1 TAB DAILY X 7 DAYS 03/02/17  Yes [provider]  propranolol (INDERAL) 40 MG tablet Take  40 mg by mouth 2 (two) times daily. 12/01/16  Yes [provider]  rOPINIRole (REQUIP) 2 MG tablet Take 2 mg by mouth at bedtime.   Yes [provider]  traMADol (ULTRAM) 50 MG tablet Take 1 tablet (50 mg total) by mouth every 6 (six) hours as needed. Patient taking differently: Take 50 mg by mouth every 6 (six) hours as needed (for pain or migraines).  01/08/15  Yes Caryl Ada K, PA-C  traZODone (DESYREL) 50 MG tablet Take  50 mg by mouth at bedtime.   Yes [provider]  Turmeric 500 MG TABS Take 500 mg by mouth at bedtime.   Yes [provider]  valACYclovir (VALTREX) 500 MG tablet Take 500 mg by mouth at bedtime.   Yes [provider]  aspirin EC 81 MG tablet Take 81 mg by mouth daily.     [provider]  nitroGLYCERIN (NITROSTAT) 0.4 MG SL tablet Place 0.4 mg under the tongue every 5 (five) minutes as needed for chest pain.  10/30/16   [provider]  predniSONE (DELTASONE) 5 MG tablet Take 1 tablet (5 mg total) by mouth daily with breakfast. 11/19/16   Chesley Mires, MD    Family History Family History  Problem Relation Age of Onset  . Diabetes Mother   . Hypertension Mother   . Heart disease Mother   . Cancer Father        LUNG AND BRAIN  . Hypertension Father   . Heart disease Maternal Grandmother   . Cancer Maternal Grandfather        LUNG  . Heart disease Paternal Grandmother   . Cancer Paternal Grandfather     Social History Social History  Substance Use Topics  . Smoking status: Former Smoker    Packs/day: 0.50    Years: 15.00    Types: Cigarettes    Quit date: 01/15/2014  . Smokeless tobacco: Never Used  . Alcohol use 0.0 oz/week     Comment: few classes a week     Allergies   Humira [adalimumab]; Bactrim [sulfamethoxazole-trimethoprim]; Percocet [oxycodone-acetaminophen]; and Vicodin [hydrocodone-acetaminophen]   Review of Systems Review of Systems  All other systems reviewed and are  negative.    Physical Exam Updated Vital Signs BP (!) 153/109 (BP Location: Right Arm)   Pulse 71   Resp 16   Ht 1.676 m (_0 )   Wt 77.1 kg (170 lb)   LMP 06/03/2012   SpO2 95%   BMI 27.44 kg/m   Physical Exam  Constitutional: She is oriented to person, place, and time. She appears well-developed and well-nourished.  Non-toxic appearance. No distress.  HENT:  Head: Normocephalic and atraumatic.  Eyes: Conjunctivae, EOM and lids are normal. Pupils are equal, round, and reactive to light.  Neck: Normal range of motion. Neck supple. No tracheal deviation present. No thyroid mass present.  Cardiovascular: Normal rate, regular rhythm and normal heart sounds.  Exam reveals no gallop.   No murmur heard. Pulmonary/Chest: Effort normal and breath sounds normal. No stridor. No respiratory distress. She has no decreased breath sounds. She has no wheezes. She has no rhonchi. She has no rales.  Abdominal: Soft. Normal appearance and bowel sounds are normal. She exhibits no distension. There is no tenderness. There is no rebound and no CVA tenderness.  Musculoskeletal: Normal range of motion. She exhibits no edema or tenderness.  Neurological: She is alert and oriented to person, place, and time. She has normal strength. No cranial nerve deficit or sensory deficit. GCS eye subscore is 4. GCS verbal subscore is 5. GCS motor subscore is 6.  Left upper and left lower extremity strength 4/5  No facial asymmetry.  Skin: Skin is warm and dry. No abrasion and no rash noted.  Psychiatric: Her speech is normal. She is slowed. She exhibits a depressed mood.  Nursing note and vitals reviewed.    ED Treatments / Results  Labs (all labs ordered are listed, but only abnormal results are displayed) Labs Reviewed  CBC - Abnormal; Notable for the following:       Result Value   WBC 12.4 (*)    All other components within normal limits  DIFFERENTIAL - Abnormal; Notable for the following:    Neutro  Abs 10.9 (*)    All other components within normal limits  COMPREHENSIVE METABOLIC PANEL - Abnormal; Notable for the following:    Glucose, Bld 169 (*)    Creatinine, Ser 1.13 (*)    GFR calc non Af Amer 56 (*)    All other components within normal limits  CBG MONITORING, ED - Abnormal; Notable for the following:    Glucose-Capillary 159 (*)    All other components within normal limits  I-STAT CHEM 8, ED - Abnormal; Notable for the following:    Creatinine, Ser 1.10 (*)    Glucose, Bld 159 (*)    All other components within normal limits  PROTIME-INR  APTT  I-STAT TROPOININ, ED    EKG  EKG Interpretation  Date/Time:  Wednesday March 10 2017 20:53:12 EDT Ventricular Rate:  67 PR Interval:    QRS Duration: 100 QT Interval:  399 QTC Calculation: 422 R Axis:   43 Text Interpretation:  Sinus rhythm RSR' in V1 or V2, probably normal variant No significant change since last tracing Confirmed by Lacretia Leigh ([id number]) on 03/10/2017 9:34:07 PM       Radiology Ct Head Wo Contrast  Result Date: 03/10/2017 CLINICAL DATA:  Left side numbness.  Difficulty speaking. EXAM: CT HEAD WITHOUT CONTRAST TECHNIQUE: Contiguous axial images were obtained from the base of the skull through the vertex without intravenous contrast. COMPARISON:  01/08/2015 FINDINGS: Brain: No acute intracranial abnormality. Specifically, no hemorrhage, hydrocephalus, mass lesion, acute infarction, or significant intracranial injury. Vascular: No hyperdense vessel or unexpected calcification. Skull: Prior left frontal craniectomy. No acute calvarial abnormality. Sinuses/Orbits: Visualized paranasal sinuses and mastoids clear. Orbital soft tissues unremarkable. Other: None IMPRESSION: No acute intracranial abnormality. Electronically Signed   By: Rolm Baptise M.D.   On: 03/10/2017 21:20    Procedures Procedures (including critical care time)  Medications Ordered in ED Medications  metoCLOPramide (REGLAN) injection 10 mg  (not administered)  diphenhydrAMINE (BENADRYL) injection 12.5 mg (not administered)     Initial Impression / Assessment and Plan / ED Course  I have reviewed the triage vital signs and the nursing notes.  Pertinent labs & imaging results that were available during my care of the patient were reviewed by me and considered in my medical decision making (see chart for details).     Concern for possibly neurological process was entertained and patient had a head CT without acute findings. Subsequent MRI of her brain which did not show any acute CVA. Patient has been seen by neurology who feels patient is to be evaluated for further workup and management. Feels that that can be done here at [facility]. Will consult hospitalist  Final Clinical Impressions(s) / ED Diagnoses   Final diagnoses:  None    New Prescriptions New Prescriptions   No medications on file     Lacretia Leigh, MD 03/11/17 0002

## 2017-03-10 NOTE — ED Notes (Signed)
Patient being transported to CT

## 2017-03-10 NOTE — ED Triage Notes (Signed)
Pt states for the past week she has been exhibiting stroke like symptoms.  States she has had numbness on her left side from her face to her arm.  Also states she has had trouble speaking.  Pt able to understand commands but is not speaking at this time. Able to answer questions appropriately using a head nod.

## 2017-03-11 ENCOUNTER — Encounter (HOSPITAL_COMMUNITY): Payer: Self-pay | Admitting: Internal Medicine

## 2017-03-11 DIAGNOSIS — R739 Hyperglycemia, unspecified: Secondary | ICD-10-CM | POA: Diagnosis present

## 2017-03-11 DIAGNOSIS — F32A Depression, unspecified: Secondary | ICD-10-CM | POA: Diagnosis present

## 2017-03-11 DIAGNOSIS — G8929 Other chronic pain: Secondary | ICD-10-CM | POA: Diagnosis present

## 2017-03-11 DIAGNOSIS — R202 Paresthesia of skin: Secondary | ICD-10-CM

## 2017-03-11 DIAGNOSIS — I10 Essential (primary) hypertension: Secondary | ICD-10-CM | POA: Diagnosis present

## 2017-03-11 DIAGNOSIS — T380X5A Adverse effect of glucocorticoids and synthetic analogues, initial encounter: Secondary | ICD-10-CM | POA: Diagnosis present

## 2017-03-11 DIAGNOSIS — E039 Hypothyroidism, unspecified: Secondary | ICD-10-CM | POA: Diagnosis present

## 2017-03-11 DIAGNOSIS — G43109 Migraine with aura, not intractable, without status migrainosus: Principal | ICD-10-CM

## 2017-03-11 DIAGNOSIS — R2990 Unspecified symptoms and signs involving the nervous system: Secondary | ICD-10-CM | POA: Diagnosis present

## 2017-03-11 DIAGNOSIS — F329 Major depressive disorder, single episode, unspecified: Secondary | ICD-10-CM | POA: Diagnosis present

## 2017-03-11 LAB — RAPID URINE DRUG SCREEN, HOSP PERFORMED
AMPHETAMINES: NOT DETECTED
BENZODIAZEPINES: POSITIVE — AB
Barbiturates: NOT DETECTED
Cocaine: NOT DETECTED
OPIATES: POSITIVE — AB
Tetrahydrocannabinol: NOT DETECTED

## 2017-03-11 LAB — TROPONIN I: Troponin I: <0.03 ng/mL (ref <0.03)

## 2017-03-11 LAB — SEDIMENTATION RATE: Sed Rate: 0 mm/hr (ref 0–22)

## 2017-03-11 LAB — C-REACTIVE PROTEIN: CRP: 0.9 mg/dL (ref <1.0)

## 2017-03-11 LAB — LIPID PANEL
CHOL/HDL RATIO: 3.4 ratio
Cholesterol: 193 mg/dL (ref 0–200)
HDL: 57 mg/dL (ref >40)
LDL CALC: 76 mg/dL (ref 0–99)
Triglycerides: 301 mg/dL — ABNORMAL HIGH (ref <150)
VLDL: 60 mg/dL — AB (ref 0–40)

## 2017-03-11 LAB — HIV ANTIBODY (ROUTINE TESTING W REFLEX): HIV Screen 4th Generation wRfx: NONREACTIVE

## 2017-03-11 MED ORDER — VALPROATE SODIUM 500 MG/5ML IV SOLN
500.0000 mg | Freq: Once | INTRAVENOUS | Status: AC
  Administered 2017-03-11: 500 mg via INTRAVENOUS
  Filled 2017-03-11: qty 5

## 2017-03-11 MED ORDER — ACETAMINOPHEN 325 MG PO TABS: 650.0000 mg | ORAL_TABLET | ORAL | Status: DC | PRN

## 2017-03-11 MED ORDER — ACETAMINOPHEN 650 MG RE SUPP: 650.0000 mg | RECTAL | Status: DC | PRN

## 2017-03-11 MED ORDER — AMLODIPINE BESYLATE 5 MG PO TABS
5.0000 mg | ORAL_TABLET | Freq: Every day | ORAL | Status: DC
  Administered 2017-03-11 – 2017-03-12 (×2): 5 mg via ORAL
  Filled 2017-03-11 (×2): qty 1

## 2017-03-11 MED ORDER — TRAZODONE HCL 50 MG PO TABS
50.0000 mg | ORAL_TABLET | Freq: Every day | ORAL | Status: DC
  Administered 2017-03-11: 50 mg via ORAL
  Filled 2017-03-11: qty 1

## 2017-03-11 MED ORDER — DIAZEPAM 2 MG PO TABS
2.0000 mg | ORAL_TABLET | Freq: Every day | ORAL | Status: DC
  Administered 2017-03-11 – 2017-03-12 (×2): 2 mg via ORAL
  Filled 2017-03-11: qty 1

## 2017-03-11 MED ORDER — ENOXAPARIN SODIUM 40 MG/0.4ML ~~LOC~~ SOLN: 40.0000 mg | SUBCUTANEOUS | Status: DC

## 2017-03-11 MED ORDER — ROPINIROLE HCL 1 MG PO TABS
2.0000 mg | ORAL_TABLET | Freq: Every day | ORAL | Status: DC
Start: 2017-03-11 — End: 2017-03-13
  Administered 2017-03-11 – 2017-03-12 (×2): 2 mg via ORAL
  Filled 2017-03-11 (×2): qty 2

## 2017-03-11 MED ORDER — LEVOTHYROXINE SODIUM 25 MCG PO TABS
137.0000 ug | ORAL_TABLET | Freq: Every day | ORAL | Status: DC
  Administered 2017-03-11 – 2017-03-13 (×3): 137 ug via ORAL
  Filled 2017-03-11 (×3): qty 1

## 2017-03-11 MED ORDER — ASPIRIN EC 81 MG PO TBEC
81.0000 mg | DELAYED_RELEASE_TABLET | Freq: Every day | ORAL | Status: DC
  Administered 2017-03-11 – 2017-03-13 (×3): 81 mg via ORAL
  Filled 2017-03-11 (×3): qty 1

## 2017-03-11 MED ORDER — ALBUTEROL SULFATE (2.5 MG/3ML) 0.083% IN NEBU
3.0000 mL | INHALATION_SOLUTION | Freq: Four times a day (QID) | RESPIRATORY_TRACT | Status: DC | PRN
Start: 2017-03-11 — End: 2017-03-13

## 2017-03-11 MED ORDER — BUPROPION HCL ER (XL) 300 MG PO TB24
300.0000 mg | ORAL_TABLET | Freq: Every day | ORAL | Status: DC
  Administered 2017-03-11 – 2017-03-12 (×2): 300 mg via ORAL
  Filled 2017-03-11 (×2): qty 1

## 2017-03-11 MED ORDER — FLUTICASONE FUROATE-VILANTEROL 100-25 MCG/INH IN AEPB
1.0000 | INHALATION_SPRAY | Freq: Every day | RESPIRATORY_TRACT | Status: DC
  Administered 2017-03-11 – 2017-03-13 (×3): 1 via RESPIRATORY_TRACT
  Filled 2017-03-11: qty 28

## 2017-03-11 MED ORDER — PROPRANOLOL HCL 40 MG PO TABS
40.0000 mg | ORAL_TABLET | Freq: Two times a day (BID) | ORAL | Status: DC
  Administered 2017-03-11 – 2017-03-13 (×6): 40 mg via ORAL
  Filled 2017-03-11 (×7): qty 1

## 2017-03-11 MED ORDER — PROCHLORPERAZINE EDISYLATE 5 MG/ML IJ SOLN
10.0000 mg | Freq: Four times a day (QID) | INTRAMUSCULAR | Status: AC
  Administered 2017-03-11 (×4): 10 mg via INTRAVENOUS
  Filled 2017-03-11 (×4): qty 2

## 2017-03-11 MED ORDER — SODIUM CHLORIDE 0.9 % IV SOLN
INTRAVENOUS | Status: DC
  Administered 2017-03-11 (×2): via INTRAVENOUS

## 2017-03-11 MED ORDER — PANTOPRAZOLE SODIUM 40 MG PO TBEC
40.0000 mg | DELAYED_RELEASE_TABLET | Freq: Every day | ORAL | Status: DC
  Administered 2017-03-11 – 2017-03-13 (×3): 40 mg via ORAL
  Filled 2017-03-11 (×3): qty 1

## 2017-03-11 MED ORDER — TRAMADOL HCL 50 MG PO TABS: 50.0000 mg | ORAL_TABLET | Freq: Four times a day (QID) | ORAL | Status: DC | PRN

## 2017-03-11 MED ORDER — PAROXETINE HCL 20 MG PO TABS
40.0000 mg | ORAL_TABLET | Freq: Every day | ORAL | Status: DC
  Administered 2017-03-11 – 2017-03-12 (×2): 40 mg via ORAL
  Filled 2017-03-11 (×2): qty 2

## 2017-03-11 MED ORDER — DIPHENHYDRAMINE HCL 50 MG/ML IJ SOLN
12.5000 mg | Freq: Once | INTRAMUSCULAR | Status: AC
  Administered 2017-03-11: 12.5 mg via INTRAVENOUS
  Filled 2017-03-11: qty 1

## 2017-03-11 MED ORDER — SENNOSIDES-DOCUSATE SODIUM 8.6-50 MG PO TABS: 1.0000 | ORAL_TABLET | Freq: Every evening | ORAL | Status: DC | PRN

## 2017-03-11 MED ORDER — VALACYCLOVIR HCL 500 MG PO TABS
500.0000 mg | ORAL_TABLET | Freq: Every day | ORAL | Status: DC
  Administered 2017-03-11 – 2017-03-12 (×2): 500 mg via ORAL
  Filled 2017-03-11 (×2): qty 1

## 2017-03-11 MED ORDER — HYDROMORPHONE HCL 4 MG PO TABS
4.0000 mg | ORAL_TABLET | Freq: Two times a day (BID) | ORAL | Status: DC | PRN
  Administered 2017-03-12 – 2017-03-13 (×3): 4 mg via ORAL
  Filled 2017-03-11 (×3): qty 1

## 2017-03-11 MED ORDER — ACETAMINOPHEN 160 MG/5ML PO SOLN: 650.0000 mg | ORAL | Status: DC | PRN

## 2017-03-11 MED ORDER — PREDNISONE 20 MG PO TABS
20.0000 mg | ORAL_TABLET | Freq: Every day | ORAL | Status: DC
  Administered 2017-03-11 – 2017-03-13 (×3): 20 mg via ORAL
  Filled 2017-03-11 (×3): qty 1

## 2017-03-11 NOTE — Evaluation (Signed)
Occupational Therapy Evaluation and Discharge Patient Details Name: [name] MRN: [id number] DOB: [birth year] Today's Date: 03/11/2017    History of Present Illness 51 yo female admitted with neurological symptoms, L sided numbness/weakness, aphasia. Hx of sarcoidosis, RLS, OA, chronic HAs, ankylosing spondylitits, back surgery, chronic back pain, HSV, depression. MRI and CT negative   Clinical Impression   This 51 yo female admitted with above presents to acute OT at an overall min guard A level from an ADL standpoint when she is up on her feet (due to falls) and has this at home from her husband. No further OT needs, we will D/C.    Follow Up Recommendations  No OT follow up;Supervision/Assistance - 24 hour    Equipment Recommendations  Other (comment) (rollator (increase fatigue causing decreased balance))       Precautions / Restrictions Precautions Precautions: Fall Precaution Comments: pt reports multiple falls Restrictions Weight Bearing Restrictions: No      Mobility Bed Mobility Overal bed mobility: Modified Independent                Transfers Overall transfer level: Needs assistance   Transfers: Sit to/from Stand Sit to Stand: Min guard         General transfer comment: close guard for safety. VCs hand placement    Balance Overall balance assessment: Needs assistance;History of Falls           Standing balance-Leahy Scale: Fair Standing balance comment: No LOB standing statically                           ADL either performed or assessed with clinical judgement   ADL                                         General ADL Comments: Overall at a min guard A level with RW. I did recommend that she use her shower seat to help conserve energy. I also recommended that she not be out in the hot hot weather if at all possible (heat and increase fatigue). I also recommended that that for any IADLs she try and sit to do  them. from and energy conservation standpoint. She can use power carts in stores that have them.     Vision Baseline Vision/History: Wears glasses Wears Glasses: At all times Patient Visual Report: No change from baseline Vision Assessment?: Yes Eye Alignment: Within Functional Limits Ocular Range of Motion: Within Functional Limits Alignment/Gaze Preference: Within Defined Limits Tracking/Visual Pursuits: Able to track stimulus in all quads without difficulty Saccades:  (has trouble coordinating her eyes to follow with increasing of speed) Convergence: Within functional limits Visual Fields: No apparent deficits Additional Comments: Advised pt do not drive due to decrease of saccadic movement coordination             Pertinent Vitals/Pain Pain Assessment: No/denies pain     Hand Dominance Right   Extremity/Trunk Assessment Upper Extremity Assessment Upper Extremity Assessment: Overall WFL for tasks assessed (does report she drops things intermittently with both hands)     Communication Communication Communication: No difficulties   Cognition Arousal/Alertness: Awake/alert Behavior During Therapy: WFL for tasks assessed/performed Overall Cognitive Status: Within Functional Limits for tasks assessed  Home Living Family/patient expects to be discharged to:: Private residence Living Arrangements: Spouse/significant other Available Help at Discharge: Family (husband disabled at home) Type of Home: House Home Access: Stairs to enter Technical brewer of Steps: 2 Entrance Stairs-Rails: Right;Left;Can reach both [place]: One level     Bathroom Shower/Tub: Tub/shower unit         Home Equipment: Environmental consultant - 2 wheels;Shower seat          Prior Functioning/Environment Level of Independence: Needs assistance  Gait / Transfers Assistance Needed: using RW for past 2 weeks.  ADL's / Homemaking  Assistance Needed: pt stated she and her husband help one another--but is doing most of the IADLs at this point            OT Problem List: Decreased strength         OT Goals(Current goals can be found in the care plan section) Acute Rehab OT Goals Patient Stated Goal: to get better  OT Frequency:                AM-PAC PT "6 Clicks" Daily Activity     Outcome Measure Help from another person eating meals?: None Help from another person taking care of personal grooming?: None Help from another person toileting, which includes using toliet, bedpan, or urinal?: None Help from another person bathing (including washing, rinsing, drying)?: None Help from another person to put on and taking off regular upper body clothing?: None Help from another person to put on and taking off regular lower body clothing?: None 6 Click Score: 24   End of Session    Activity Tolerance: Patient tolerated treatment well Patient left: in bed;with call bell/phone within reach;with bed alarm set  OT Visit Diagnosis: Unsteadiness on feet (R26.81)                Time: 9407-6808 OT Time Calculation (min): 20 min Charges:  OT General Charges $OT Visit: 1 Procedure OT Evaluation $OT Eval Moderate Complexity: 1 Procedure G-Codes: OT G-codes **NOT FOR INPATIENT CLASS** Functional Assessment Tool Used: Clinical judgement Functional Limitation: Self care Self Care Current Status (U1103): At least 1 percent but less than 20 percent impaired, limited or restricted Self Care Goal Status (P5945): At least 1 percent but less than 20 percent impaired, limited or restricted Self Care Discharge Status [phone]): At least 1 percent but less than 20 percent impaired, limited or restricted   [address], OTR/L [phone] 03/11/2017

## 2017-03-11 NOTE — Evaluation (Signed)
Speech Language Pathology Evaluation Patient Details Name: [name] MRN: [id number] DOB: 1966-08-18 Today's Date: 03/11/2017 Time: 1440-1500 SLP Time Calculation (min) (ACUTE ONLY): 20 min  Problem List:  Patient Active Problem List   Diagnosis Date Noted  . Neurological symptoms 03/11/2017  . Steroid-induced hyperglycemia 03/11/2017  . Depression 03/11/2017  . Essential hypertension 03/11/2017  . Hypothyroidism 03/11/2017  . Chronic pain 03/11/2017  . Palpitations 11/04/2016  . Near syncope 11/04/2016  . Dyspnea 11/04/2016  . Headache 11/14/2014  . Sarcoidosis 11/12/2014  . Left-sided weakness   . Left hemiparesis ([place]) 09/18/2014  . Weakness 09/18/2014  . Frontal skull lesion 09/18/2014  . Atypical chest pain 09/18/2014  . Extrinsic asthma 04/25/2013  . Weight gain 01/12/2013  . Erosion of graft 11/16/2012   Past Medical History:  Past Medical History:  Diagnosis Date  . Ankylosing spondylitis ([place])   . Anxiety   . Arthritis    "back, hands, neck" (09/18/2014)  . Asthma   . Chronic back pain    "whole spine"  . Chronic bronchitis ([place])    "get it close to q yr" (09/18/2014)  . Depression   . Environmental allergies   . Erosion of suburethral sling ([place])   . Frequency of urination   . GERD (gastroesophageal reflux disease)   . Headache    "at least several times/wk" (09/18/2014)  . Heart murmur    "when I was a baby"  . Herpes simplex type 2 infection   . HLA B27 positive   . Hypothyroidism   . IBS (irritable bowel syndrome)   . IC (interstitial cystitis)   . Migraine    "maybe once/month" (09/18/2014)  . Nocturia   . NSVD (normal spontaneous vaginal delivery)    x3  . Osteoarthritis   . RLS (restless legs syndrome)   . Sarcoid   . Seizures ([place])    "when I was a teenager"  . Shortness of breath dyspnea    Exertion  . SUI (stress urinary incontinence, female)    Past Surgical History:  Past Surgical History:  Procedure Laterality Date  .  ABDOMINAL HYSTERECTOMY  2006  . ANTERIOR CERVICAL DECOMP/DISCECTOMY FUSION  2011  . APPENDECTOMY  1985  . ARTHROPLASTY     Left thumb  . BACK SURGERY  2008   X 2 in 2010  . CESAREAN SECTION  2000  . CRANIECTOMY FOR DEPRESSED SKULL FRACTURE Left 12/05/2014   Procedure: Left frontal stereotactic craniectomy for biopsy of skull lesion;  Surgeon: Consuella Lose, MD;  Location: [facility];  Service: Neurosurgery;  Laterality: Left;  Left frontal stereotactic craniectomy for biopsy of skull lesion  . CYSTO WITH HYDRODISTENSION N/A 11/30/2012   Procedure: CYSTOSCOPY/HYDRODISTENSION;  Surgeon: Bernestine Amass, MD;  Location: [facility];  Service: Urology;  Laterality: N/A;  . KNEE ARTHROSCOPY Right   . LUMBAR DISC SURGERY  02-02-2007   RIGHT SIDE L5 -- S1  . LUMBAR FUSION  12-04-2008   L4  -- S1  . LYNX RETROPUBIC SUBURETHRAL SLING  03-02-2007  . MEDIASTINOSCOPY N/A 10/26/2014   Procedure: MEDIASTINOSCOPY;  Surgeon: Melrose Nakayama, MD;  Location: [facility];  Service: Thoracic;  Laterality: N/A;  . PELVIC LAPAROSCOPY  1990's   LYSIS ADHESIONS  . PUBOVAGINAL SLING N/A 11/30/2012   Procedure: Gaynelle Arabian;  Surgeon: Bernestine Amass, MD;  Location: [facility];  Service: Urology;  Laterality: N/A;  1 HR EXAM UNDER ANESTHESIA, EXCISION OF SUB URETHRAL MESH, CYSTO, HOD   .  TONSILLECTOMY  1986  . TUBAL LIGATION  2001   hulka clip  . VIDEO BRONCHOSCOPY WITH ENDOBRONCHIAL ULTRASOUND N/A 09/21/2014   Procedure: VIDEO BRONCHOSCOPY WITH ENDOBRONCHIAL ULTRASOUND;  Surgeon: Collene Gobble, MD;  Location: [facility];  Service: Thoracic;  Laterality: N/A;   HPI:  51 yo female adm to [facility] with neurological symptoms including left sided weakness x 2 weeks.  PMH  for migraines with right sided aura, sarcoidosis, s/p craniotomy and ACDF C4-C7.  Pt used to work for [employer] but has been unable to work due to her multiple pain issues.  Speech/cognitive evaluation ordered.      Assessment / Plan / Recommendation Clinical Impression  MOCA version 8.1 (cognitive assessment) given pt - score 27/30 which is [facility].  No dysarthria or nor functional expressive language deficits noted.  Pt did have difficulty with verbal fluency naming only 3 words in 60 seconds but word finding deficits not apparent during session.  Pt admits to some premorbid memory and attention difficulties and states her husband helps manages home responsibilties.  Pt admits to transient receptive language difficulty last night with her son.  SLP provided pt with word finding strategies and tasks to improve attention, verbal fluency.  She declined need for memory compensation strategies.  No SLP follow up indicated.      SLP Assessment  SLP Recommendation/Assessment: Patient does not need any further Speech Lanaguage Pathology Services SLP Visit Diagnosis: Cognitive communication deficit (R41.841)    Follow Up Recommendations  None    Frequency and Duration           SLP Evaluation Cognition  Overall Cognitive Status: Within Functional Limits for tasks assessed Arousal/Alertness: Awake/alert Orientation Level: Oriented X4 Attention: Sustained Sustained Attention: Appears intact Memory: Appears intact (recalled 4/5 words independently, 1 with category cue) Awareness: Appears intact Problem Solving: Appears intact Safety/Judgment: Appears intact       Comprehension  Auditory Comprehension Overall Auditory Comprehension: Appears within functional limits for tasks assessed Yes/No Questions: Not tested Commands: Within Functional Limits Conversation: Complex Visual Recognition/Discrimination Discrimination: Within Function Limits Reading Comprehension Reading Status: Not tested    Expression Expression Primary Mode of Expression: Verbal Verbal Expression Overall Verbal Expression: Appears within functional limits for tasks assessed Initiation: No impairment Level of  Generative/Spontaneous Verbalization: Conversation Repetition: No impairment Naming: No impairment Pragmatics: No impairment Non-Verbal Means of Communication: Not applicable Other Verbal Expression Comments: naming words in 60 seconds, pt able to name only 3 - but no observed word finding deficits during session Written Expression Dominant Hand: Right Written Expression:  (clock drawing WFL)   Oral / Motor  Oral Motor/Sensory Function Overall Oral Motor/Sensory Function: Within functional limits Motor Speech Overall Motor Speech: Appears within functional limits for tasks assessed Respiration: Within functional limits Resonance: Within functional limits Articulation: Within functional limitis Intelligibility: Intelligible Motor Planning: Witnin functional limits   GO          Functional Assessment Tool Used: MOCA Functional Limitations: Memory Memory Current Status (E2683): At least 1 percent but less than 20 percent impaired, limited or restricted Memory Goal Status (M1962): At least 1 percent but less than 20 percent impaired, limited or restricted Memory Discharge Status [phone]): At least 1 percent but less than 20 percent impaired, limited or restricted         Claudie Fisherman, [place] [facility] SLP [phone]

## 2017-03-11 NOTE — ED Notes (Signed)
Will give bedside report to room 1440.

## 2017-03-11 NOTE — Evaluation (Signed)
Physical Therapy Evaluation Patient Details Name: [name] MRN: [id number] DOB: 11/25/1965 Today's Date: 03/11/2017   History of Present Illness  51 yo female admitted with neurological symptoms, L sided numbness/weakness, aphasia. Hx of sarcoidosis, RLS, OA, chronic HAs, ankylosing spondylitits, back surgery, chronic back pain, HSV, depression  Clinical Impression  On eval, pt was Min assist for mobility. She walked ~75 feet with a RW and 15 feet while holding on to IV pole. Pt presents with general weakness, decreased activity tolerance, and impaired gait and balance. Pt fatigues fairly easily with activity. Recommend HHPT and 24 hour supervision/assist until she returns to baseline. Will follow.     Follow Up Recommendations Home health PT;Supervision/Assistance - 24 hour    Equipment Recommendations   (4 wheeled rolling walker due to fatigue)    Recommendations for Other Services OT consult     Precautions / Restrictions Precautions Precautions: Fall Precaution Comments: pt reports multiple falls Restrictions Weight Bearing Restrictions: No      Mobility  Bed Mobility Overal bed mobility: Modified Independent                Transfers Overall transfer level: Needs assistance   Transfers: Sit to/from Stand Sit to Stand: Min guard         General transfer comment: close guard for safety. VCs hand placement  Ambulation/Gait Ambulation/Gait assistance: Min assist Ambulation Distance (Feet): 75 Feet (75'x 1, 15'x1) Assistive device: Rolling walker (2 wheeled) (IV pole) Gait Pattern/deviations: Step-through pattern;Decreased stride length;Narrow base of support     General Gait Details: Used IV pole for short distance into bathroom-LOB x 1 towards L side. Used RW for hallway ambulation. Intermittent assist to steady. Pt fatigues fairly quickly/easily  Stairs            Wheelchair Mobility    Modified Rankin (Stroke Patients Only)        Balance Overall balance assessment: Needs assistance;History of Falls           Standing balance-Leahy Scale: Fair Standing balance comment: No LOB standing statically                             Pertinent Vitals/Pain Pain Assessment: No/denies pain    Home Living Family/patient expects to be discharged to:: Private residence Living Arrangements: Spouse/significant other Available Help at Discharge: Family (husband disabled at home) Type of Home: House Home Access: Stairs to enter Entrance Stairs-Rails: Right;Left;Can reach both Technical brewer of Steps: 2 Home Layout: One level        Prior Function Level of Independence: Needs assistance   Gait / Transfers Assistance Needed: using RW for past 2 weeks.   ADL's / Homemaking Assistance Needed: pt stated she and her husband help one another        Hand Dominance        Extremity/Trunk Assessment   Upper Extremity Assessment Upper Extremity Assessment: Defer to OT evaluation    Lower Extremity Assessment Lower Extremity Assessment: Generalized weakness    Cervical / Trunk Assessment Cervical / Trunk Assessment: Normal  Communication   Communication: No difficulties  Cognition Arousal/Alertness: Awake/alert Behavior During Therapy: WFL for tasks assessed/performed Overall Cognitive Status: Within Functional Limits for tasks assessed                                        General  Comments      Exercises     Assessment/Plan    PT Assessment Patient needs continued PT services  PT Problem List Decreased strength;Decreased mobility;Decreased activity tolerance;Decreased balance;Decreased knowledge of use of DME       PT Treatment Interventions DME instruction;Gait training;Therapeutic activities;Therapeutic exercise;Patient/family education;Functional mobility training;Balance training    PT Goals (Current goals can be found in the Care Plan section)  Acute Rehab  PT Goals Patient Stated Goal: to get better PT Goal Formulation: With patient Time For Goal Achievement: 03/25/17 Potential to Achieve Goals: Good    Frequency Min 3X/week   Barriers to discharge        Co-evaluation               AM-PAC PT "6 Clicks" Daily Activity  Outcome Measure Difficulty turning over in bed (including adjusting bedclothes, sheets and blankets)?: A Little Difficulty moving from lying on back to sitting on the side of the bed? : A Little Difficulty sitting down on and standing up from a chair with arms (e.g., wheelchair, bedside commode, etc,.)?: Total Help needed moving to and from a bed to chair (including a wheelchair)?: A Little Help needed walking in hospital room?: A Little Help needed climbing 3-5 steps with a railing? : A Little 6 Click Score: 16    End of Session Equipment Utilized During Treatment: Gait belt Activity Tolerance: Patient limited by fatigue Patient left: in bed;with call bell/phone within reach (sitting EOB with OT)   PT Visit Diagnosis: Muscle weakness (generalized) (M62.81);Difficulty in walking, not elsewhere classified (R26.2)    Time: 1355-1410 PT Time Calculation (min) (ACUTE ONLY): 15 min   Charges:   PT Evaluation $PT Eval Low Complexity: 1 Procedure     PT G Codes:   PT G-Codes **NOT FOR INPATIENT CLASS** Functional Assessment Tool Used: AM-PAC 6 Clicks Basic Mobility;Clinical judgement Functional Limitation: Mobility: Walking and moving around Mobility: Walking and Moving Around Current Status (P3790): At least 1 percent but less than 20 percent impaired, limited or restricted Mobility: Walking and Moving Around Goal Status [phone]): At least 1 percent but less than 20 percent impaired, limited or restricted      Weston Anna, MPT Pager: [phone]

## 2017-03-11 NOTE — Progress Notes (Signed)
PT Cancellation Note  Patient Details Name: [name] MRN: [id number] DOB: 01-May-1966   Cancelled Treatment:    Reason Eval/Treat Not Completed: Medical issues which prohibited therapy. Pt currently on bedrest. Please update activity status for participation with PT. Thanks.    Weston Anna, MPT Pager: [phone]

## 2017-03-11 NOTE — Progress Notes (Signed)
Patient seen and examined. Admitted after midnight secondary to left side weakness, intermittent confusion and also intermittent slurring/expressive aphasia. Patient on presentation was also complaining of HA's and had hx of migraine. Neurology consulted and presumed that presentation was most likely complicated migraine and initiate empiric therapy for it. CT head and MRI neg for acute abnormalities. Please refer to H&P written by Dr. Lorin Mercy for further info/details on admission.  Plan: -will follow clinical response -if symptoms persist or worsen, will need LP and potentially increase dose on her steroids, with concerns for neurosarcoidosis. -SPL found not abnormalities and patient is currently not experiencing any aphasia or difficulty talking/swallowing.  Barton Dubois [phone]

## 2017-03-11 NOTE — Consult Note (Signed)
Neurology Consultation Reason for Consult: Left sided weakness Referring Physician: Zenia Resides, A  CC: Left sided weakness  History is obtained from:patient  HPI: [name] is a 51 y.o. female with a history of sarcoidosis diagnosed by skull biopsy in April 2016 who presents with 2 weeks of left-sided weakness, trouble walking, left-sided numbness. She states that the onset was relatively abrupt 2 weeks ago, and that she felt numbness of her left face, and noticed that her walking got significantly worse. She had started using a walker. She made an appointment with an outpatient neurologist([facility]), and is still awaiting that appointment.   She has intermittent waxing and waning of her symptoms, with recurrent "flares" of her numbness and difficulty walking which is accompanied by worsening headache.  She does have a history of migraines, but states this feels different. She typically gets migraines on the right side and has right-sided aura. She has not had any clear visual aura with this episode.  Previous workup 2016:  ANA, HIV, B12-normal Serum Ace-elevated   ROS: A 14 point ROS was performed and is negative except as noted in the HPI.  Past Medical History:  Diagnosis Date  . Ankylosing spondylitis ([place])   . Anxiety   . Arthritis    "back, hands, neck" (09/18/2014)  . Asthma   . Chronic back pain    "whole spine"  . Chronic bronchitis ([place])    "get it close to q yr" (09/18/2014)  . Depression   . Environmental allergies   . Erosion of suburethral sling ([place])   . Frequency of urination   . GERD (gastroesophageal reflux disease)   . Headache    "at least several times/wk" (09/18/2014)  . Heart murmur    "when I was a baby"  . Herpes simplex type 2 infection   . HLA B27 positive   . Hypothyroidism   . IBS (irritable bowel syndrome)   . IC (interstitial cystitis)   . Migraine    "maybe once/month" (09/18/2014)  . Nocturia   . NSVD (normal spontaneous vaginal  delivery)    x3  . Osteoarthritis   . RLS (restless legs syndrome)   . Sarcoid   . Seizures ([place])    "when I was a teenager"  . Shortness of breath dyspnea    Exertion  . SUI (stress urinary incontinence, female)      Family History  Problem Relation Age of Onset  . Diabetes Mother   . Hypertension Mother   . Heart disease Mother   . Cancer Father        LUNG AND BRAIN  . Hypertension Father   . Heart disease Maternal Grandmother   . Cancer Maternal Grandfather        LUNG  . Heart disease Paternal Grandmother   . Cancer Paternal Grandfather      Social History:  reports that she quit smoking about 3 years ago. Her smoking use included Cigarettes. She has a 7.50 pack-year smoking history. She has never used smokeless tobacco. She reports that she drinks alcohol. She reports that she uses drugs.   Exam: Current vital signs: BP (!) 155/95 (BP Location: Right Arm)   Pulse 72   Temp 98.2 F (36.8 C)   Resp 18   Ht '5\' 6"'$  (1.676 m)   Wt 77.1 kg (170 lb)   LMP 06/03/2012   SpO2 97%   BMI 27.44 kg/m  Vital signs in last 24 hours: Temp:  [98.2 F (36.8 C)] 98.2 F (  36.8 C) (07/11 2329) Pulse Rate:  [66-72] 72 (07/12 0024) Resp:  [13-18] 18 (07/12 0024) BP: (132-177)/(89-109) 155/95 (07/12 0024) SpO2:  [95 %-99 %] 97 % (07/12 0024) Weight:  [77.1 kg (170 lb)] 77.1 kg (170 lb) (07/11 2041)   Physical Exam  Constitutional: Appears well-developed and well-nourished.  Psych: Affect appropriate to situation Eyes: No scleral injection HENT: No OP obstrucion Head: Normocephalic.  Cardiovascular: Normal rate and regular rhythm.  Respiratory: Effort normal and breath sounds normal to anterior ascultation GI: Soft.  No distension. There is no tenderness.  Skin: WDI  Neuro: Mental Status: Patient is awake, alert, oriented to person, place, month, year, and situation. Patient is able to give a clear and coherent history. No signs of aphasia or neglect Cranial  Nerves: II: Visual Fields are full. Pupils are equal, round, and reactive to light.   III,IV, VI: EOMI without ptosis or diploplia.  V: Facial sensation is decreased on the left VII: Facial movement with? Mild left facial weakness VIII: hearing is intact to voice X: Uvula elevates symmetrically XI: Shoulder shrug is symmetric. XII: tongue is midline without atrophy or fasciculations.  Motor: Tone is normal. Bulk is normal. 5/5 strength was present on the right, she has 4+/5 weakness of the left arm and leg. + Orbital sign on left Sensory: Sensation is symmetric to light touch and temperature in the arms and legs. Deep Tendon Reflexes: 2+ and symmetric in the biceps  And 3+ at patella, several beats of clonus in the left ankle Plantars: Toes are downgoing bilaterally.  Cerebellar: Fingers finger without dysmetria  I have reviewed labs in epic and the results pertinent to this consultation are: CMP-unremarkable  I have reviewed the images obtained: MRI brain-no acute findings  Impression: 51 year old female with waxing/waning left-sided  Hypoesthesia, persistent left hemiparesis, gait difficulty setting of previous diagnosis of (non-neuro)sarcoidosis and HLA-B27.  Possibilities include complicated migraine versus neurosarcoidosis. The normal imaging would argue against neurosarcoidosis, but I don't think excludes. I would favor further evaluation with lumbar puncture to assess for inflammation.   Complicated migraine with persistent aura would be one other consideration given the character of her description, and I would favor treating for this, if her symptoms greatly improved than a be able to hold off on increasing steroids.  Recommendations: 1) repeat ANA, SSA, SSB,RF 2) lumbar puncture to assess for inflammation with cells, glucose, protein, IgG index, oligoclonal bands, CSF Ace 3) Compazine every 6 hours 4 doses, Depakote 1 dose 4) if there is any evidence of inflammation on  the LP, or treating for migraine does not improve her symptoms, then I would consider starting steroids for treatment of presumed neurosarcoidosis  Roland Rack, MD [facility] [phone]  If 7pm- 7am, please page neurology on call as listed in [place].

## 2017-03-11 NOTE — H&P (Signed)
History and Physical    [name]:[id number] DOB: 1965/11/27 DOA: 03/10/2017  PCP: Maurice Small, MD Consultants:  Halford Chessman - pulmonology; [facility] - rheumatology; Johnsie Cancel - cardiology Patient coming from: Home - lives with husband, son; [name], [phone]  Chief Complaint: neurologic symptoms  HPI: [name] is a 51 y.o. female with medical history significant of sarcoidosis; RLS; interstitial cystitis; IBS; hypothyroidism; HSV; depression; and chronic pain presenting with various neurologic symptoms.  For the last couple of weeks she has had a lot of weakness.  Has required the walker for ambulation.  Her back, hips, knees, legs feel very heavy, like lead.  Feels like a pinball, can't walk well.  Severe headaches, numbness and tingling on the left side of her face.  It is frequent but not there all the time.  Occasional trouble swallowing liquids, particularly cold liquids.  Difficulty with speech - slurring, some expressive apahsia.  Gets disoriented.  Tonight and one other time, acute onset of confusion and she was leaning forward and suddenly couldn't speak or understand.  It lasted about a minute.  No aura.  Left sided numbness and weakness.  +bowel/bladder incontinence - chronically with bladder since 1998 but bowel since early 2000s (has IBS).   ED Course:  Various neurology symptoms, negative CT, negative MRI.  Neurology consulted and recommends observation for further evaluation and management  Review of Systems: As per HPI; otherwise review of systems reviewed and negative.   Ambulatory Status:  Requires a walker for ambulation for at least 2 weeks; previously ambulated without assistance  Past Medical History:  Diagnosis Date  . Ankylosing spondylitis ([place])   . Anxiety   . Arthritis    "back, hands, neck" (09/18/2014)  . Asthma   . Chronic back pain    "whole spine"  . Chronic bronchitis ([place])    "get it close to q yr" (09/18/2014)  . Depression   . Environmental  allergies   . Erosion of suburethral sling ([place])   . Frequency of urination   . GERD (gastroesophageal reflux disease)   . Headache    "at least several times/wk" (09/18/2014)  . Heart murmur    "when I was a baby"  . Herpes simplex type 2 infection   . HLA B27 positive   . Hypothyroidism   . IBS (irritable bowel syndrome)   . IC (interstitial cystitis)   . Migraine    "maybe once/month" (09/18/2014)  . Nocturia   . NSVD (normal spontaneous vaginal delivery)    x3  . Osteoarthritis   . RLS (restless legs syndrome)   . Sarcoid   . Seizures ([place])    "when I was a teenager"  . Shortness of breath dyspnea    Exertion  . SUI (stress urinary incontinence, female)     Past Surgical History:  Procedure Laterality Date  . ABDOMINAL HYSTERECTOMY  2006  . ANTERIOR CERVICAL DECOMP/DISCECTOMY FUSION  2011  . APPENDECTOMY  1985  . ARTHROPLASTY     Left thumb  . BACK SURGERY  2008   X 2 in 2010  . CESAREAN SECTION  2000  . CRANIECTOMY FOR DEPRESSED SKULL FRACTURE Left 12/05/2014   Procedure: Left frontal stereotactic craniectomy for biopsy of skull lesion;  Surgeon: Consuella Lose, MD;  Location: [facility];  Service: Neurosurgery;  Laterality: Left;  Left frontal stereotactic craniectomy for biopsy of skull lesion  . CYSTO WITH HYDRODISTENSION N/A 11/30/2012   Procedure: CYSTOSCOPY/HYDRODISTENSION;  Surgeon: Bernestine Amass, MD;  Location:   SURGERY CENTER;  Service: Urology;  Laterality: N/A;  . KNEE ARTHROSCOPY Right   . LUMBAR DISC SURGERY  02-02-2007   RIGHT SIDE L5 -- S1  . LUMBAR FUSION  12-04-2008   L4  -- S1  . LYNX RETROPUBIC SUBURETHRAL SLING  03-02-2007  . MEDIASTINOSCOPY N/A 10/26/2014   Procedure: MEDIASTINOSCOPY;  Surgeon: Loreli Slot, MD;  Location: [facility] OR;  Service: Thoracic;  Laterality: N/A;  . PELVIC LAPAROSCOPY  1990's   LYSIS ADHESIONS  . PUBOVAGINAL SLING N/A 11/30/2012   Procedure: Leonides Grills;  Surgeon: Valetta Fuller, MD;  Location:  [facility];  Service: Urology;  Laterality: N/A;  1 HR EXAM UNDER ANESTHESIA, EXCISION OF SUB URETHRAL MESH, CYSTO, HOD   . TONSILLECTOMY  1986  . TUBAL LIGATION  2001   hulka clip  . VIDEO BRONCHOSCOPY WITH ENDOBRONCHIAL ULTRASOUND N/A 09/21/2014   Procedure: VIDEO BRONCHOSCOPY WITH ENDOBRONCHIAL ULTRASOUND;  Surgeon: Leslye Peer, MD;  Location: [facility] OR;  Service: Thoracic;  Laterality: N/A;    Social History   Social History  . Marital status: Married    Spouse name: N/A  . Number of children: N/A  . Years of education: N/A   Occupational History  . unemployed due to medical reasons    Social History Main Topics  . Smoking status: Former Smoker    Packs/day: 0.50    Years: 15.00    Types: Cigarettes    Quit date: 01/15/2014  . Smokeless tobacco: Never Used  . Alcohol use 0.0 oz/week     Comment: few glasses a week  . Drug use: Yes     Comment: marijuana medicinal patches on her back  . Sexual activity: Not Currently   Other Topics Concern  . Not on file   Social History Narrative  . No narrative on file    Allergies  Allergen Reactions  . Humira [Adalimumab] Other (See Comments)    Muscle weakness  . Bactrim [Sulfamethoxazole-Trimethoprim] Other (See Comments)    Fever   . Percocet [Oxycodone-Acetaminophen] Itching  . Vicodin [Hydrocodone-Acetaminophen] Itching    Family History  Problem Relation Age of Onset  . Diabetes Mother   . Hypertension Mother   . Heart disease Mother   . Cancer Father        LUNG AND BRAIN  . Hypertension Father   . Heart disease Maternal Grandmother   . Cancer Maternal Grandfather        LUNG  . Heart disease Paternal Grandmother   . Cancer Paternal Grandfather     Prior to Admission medications   Medication Sig Start Date End Date Taking? Authorizing Provider  albuterol (PROVENTIL HFA;VENTOLIN HFA) 108 (90 BASE) MCG/ACT inhaler Inhale 2 puffs into the lungs every 6 (six) hours as needed. For shortness of  breath Patient taking differently: Inhale 2 puffs into the lungs every 6 (six) hours as needed for shortness of breath.  04/25/13  Yes Reather Littler, MD  amLODipine (NORVASC) 5 MG tablet Take 5 mg by mouth at bedtime.    Yes [provider]  BREO ELLIPTA 100-25 MCG/INH AEPB INHALE 1 PUFF INTO THE LUNGS DAILY. 08/20/16  Yes Coralyn Helling, MD  buPROPion (WELLBUTRIN XL) 300 MG 24 hr tablet Take 300 mg by mouth at bedtime. Reported on 08/20/2015 01/15/15  Yes [provider]  diazepam (VALIUM) 2 MG tablet Take 2 mg by mouth at bedtime.   Yes [provider]  HYDROmorphone (DILAUDID) 4 MG tablet Take 4  mg by mouth 2 (two) times daily as needed for pain. 11/16/16  Yes [provider]  levothyroxine (SYNTHROID, LEVOTHROID) 137 MCG tablet Take 137 mcg by mouth every morning.    Yes [provider]  naproxen (NAPROSYN) 500 MG tablet Take 500 mg by mouth 2 (two) times daily as needed (for pain).    Yes [provider]  omeprazole (PRILOSEC) 40 MG capsule Take 40 mg by mouth at bedtime.   Yes [provider]  ondansetron (ZOFRAN) 4 MG tablet Take 1 tablet (4 mg total) by mouth every 8 (eight) hours as needed for nausea or vomiting. 01/29/17  Yes Seabron Spates, CNM  PARoxetine (PAXIL) 40 MG tablet Take 40 mg by mouth at bedtime. 11/29/15  Yes [provider]  predniSONE (DELTASONE) 20 MG tablet TAKE 2 TABS DAILY X 7 DAYS, THEN 1 TAB DAILY X 7 DAYS 03/02/17  Yes [provider]  propranolol (INDERAL) 40 MG tablet Take 40 mg by mouth 2 (two) times daily. 12/01/16  Yes [provider]  rOPINIRole (REQUIP) 2 MG tablet Take 2 mg by mouth at bedtime.   Yes [provider]  traMADol (ULTRAM) 50 MG tablet Take 1 tablet (50 mg total) by mouth every 6 (six) hours as needed. Patient taking differently: Take 50 mg by mouth every 6 (six) hours as needed (for pain or migraines).  01/08/15  Yes Caryl Ada K, PA-C  traZODone (DESYREL)  50 MG tablet Take 50 mg by mouth at bedtime.   Yes [provider]  Turmeric 500 MG TABS Take 500 mg by mouth at bedtime.   Yes [provider]  valACYclovir (VALTREX) 500 MG tablet Take 500 mg by mouth at bedtime.   Yes [provider]  aspirin EC 81 MG tablet Take 81 mg by mouth daily.     [provider]  nitroGLYCERIN (NITROSTAT) 0.4 MG SL tablet Place 0.4 mg under the tongue every 5 (five) minutes as needed for chest pain.  10/30/16   [provider]  predniSONE (DELTASONE) 5 MG tablet Take 1 tablet (5 mg total) by mouth daily with breakfast. 11/19/16   Chesley Mires, MD    Physical Exam: Vitals:   03/10/17 2323 03/10/17 2329 03/11/17 0024 03/11/17 0054  BP: 132/89  (!) 155/95 139/87  Pulse: 72  72 76  Resp: '18  18 20  '$ Temp:  98.2 F (36.8 C)    SpO2: 97%  97% 98%  Weight:      Height:         General:  Appears calm and comfortable and is NAD; very flat affect Eyes:  PERRL, EOMI, normal lids, iris ENT:  grossly normal hearing, lips & tongue, mmm Neck:  no LAD, masses or thyromegaly Cardiovascular:  RRR, no m/r/g. No LE edema.  Respiratory:  CTA bilaterally, no w/r/r. Normal respiratory effort. Abdomen:  soft, ntnd, NABS Skin:  no rash or induration seen on limited exam Musculoskeletal:  grossly normal tone BUE/BLE, good ROM, no bony abnormality Psychiatric:  Depressed mood and affect, speech fluent and appropriate, AOx3 Neurologic:  CN 2-12 grossly intact, moves all extremities in coordinated fashion, sensation intact  Labs on Admission: I have personally reviewed following labs and imaging studies  CBC:  Recent Labs Lab 03/10/17 2054 03/10/17 2109  WBC 12.4*  --   NEUTROABS 10.9*  --   HGB 13.1 13.6  HCT 38.4 40.0  MCV 88.3  --   PLT 250  --  Basic Metabolic Panel:  Recent Labs Lab 03/10/17 2054 03/10/17 2109  NA 140 140  K 4.7 4.7  CL 107 104  CO2 26  --   GLUCOSE 169* 159*  BUN 16 16  CREATININE 1.13*  1.10*  CALCIUM 9.1  --    GFR: Estimated Creatinine Clearance: 64.1 mL/min (A) (by C-G formula based on SCr of 1.1 mg/dL (H)). Liver Function Tests:  Recent Labs Lab 03/10/17 2054  AST 19  ALT 16  ALKPHOS 74  BILITOT 0.3  PROT 7.0  ALBUMIN 4.3   No results for input(s): LIPASE, AMYLASE in the last 168 hours. No results for input(s): AMMONIA in the last 168 hours. Coagulation Profile:  Recent Labs Lab 03/10/17 2054  INR 1.01   Cardiac Enzymes: No results for input(s): CKTOTAL, CKMB, CKMBINDEX, TROPONINI in the last 168 hours. BNP (last 3 results) No results for input(s): PROBNP in the last 8760 hours. HbA1C: No results for input(s): HGBA1C in the last 72 hours. CBG:  Recent Labs Lab 03/10/17 2104  GLUCAP 159*   Lipid Profile: No results for input(s): CHOL, HDL, LDLCALC, TRIG, CHOLHDL, LDLDIRECT in the last 72 hours. Thyroid Function Tests: No results for input(s): TSH, T4TOTAL, FREET4, T3FREE, THYROIDAB in the last 72 hours. Anemia Panel: No results for input(s): VITAMINB12, FOLATE, FERRITIN, TIBC, IRON, RETICCTPCT in the last 72 hours. Urine analysis:    Component Value Date/Time   COLORURINE YELLOW 01/17/2016 Al[place] 01/17/2016 1509   LABSPEC 1.007 01/17/2016 1509   PHURINE 6.0 01/17/2016 1509   GLUCOSEU NEGATIVE 01/17/2016 1509   HGBUR NEGATIVE 01/17/2016 1509   BILIRUBINUR NEGATIVE 01/17/2016 1509   KETONESUR NEGATIVE 01/17/2016 1509   PROTEINUR NEGATIVE 01/17/2016 1509   UROBILINOGEN 0.2 11/14/2014 0625   NITRITE NEGATIVE 01/17/2016 1509   LEUKOCYTESUR NEGATIVE 01/17/2016 1509    Creatinine Clearance: Estimated Creatinine Clearance: 64.1 mL/min (A) (by C-G formula based on SCr of 1.1 mg/dL (H)).  Sepsis Labs: '@LABRCNTIP'$ (procalcitonin:4,lacticidven:4) )No results found for this or any previous visit (from the past 240 hour(s)).   Radiological Exams on Admission: Ct Head Wo Contrast  Result Date: 03/10/2017 CLINICAL DATA:   Left side numbness.  Difficulty speaking. EXAM: CT HEAD WITHOUT CONTRAST TECHNIQUE: Contiguous axial images were obtained from the base of the skull through the vertex without intravenous contrast. COMPARISON:  01/08/2015 FINDINGS: Brain: No acute intracranial abnormality. Specifically, no hemorrhage, hydrocephalus, mass lesion, acute infarction, or significant intracranial injury. Vascular: No hyperdense vessel or unexpected calcification. Skull: Prior left frontal craniectomy. No acute calvarial abnormality. Sinuses/Orbits: Visualized paranasal sinuses and mastoids clear. Orbital soft tissues unremarkable. Other: None IMPRESSION: No acute intracranial abnormality. Electronically Signed   By: Rolm Baptise M.D.   On: 03/10/2017 21:20   Mr Jeri Cos And Wo Contrast  Result Date: 03/10/2017 CLINICAL DATA:  Initial evaluation for 1 week history of left-sided numbness. EXAM: MRI HEAD WITHOUT AND WITH CONTRAST MRI CERVICAL SPINE WITHOUT AND WITH CONTRAST TECHNIQUE: Multiplanar, multiecho pulse sequences of the brain and surrounding structures, and cervical spine, to include the craniocervical junction and cervicothoracic junction, were obtained without and with intravenous contrast. CONTRAST:  25m MULTIHANCE GADOBENATE DIMEGLUMINE 529 MG/ML IV SOLN COMPARISON:  Prior CT from earlier same day as well as previous MRI from 07/20/2016. FINDINGS: MRI HEAD FINDINGS Brain: Cerebral volume within normal limits for age. Scattered subcentimeter T2/FLAIR hyperintense foci noted within the supratentorial cerebral white matter common nonspecific, but felt to be within normal limits for age. No abnormal foci of restricted  diffusion to suggest acute or subacute ischemia. Gray-white matter differentiation well maintained. No evidence for chronic infarction. No evidence for acute or chronic intracranial hemorrhage. No mass lesion, midline shift or mass effect. Ventricles normal in size without evidence for hydrocephalus. No  extra-axial fluid collection. Major dural sinuses are grossly patent. No abnormal enhancement. Pituitary gland suprasellar region within normal limits. Vascular: Major intracranial vascular flow voids are maintained. Skull and upper cervical spine: Craniocervical junction normal. Bone marrow signal intensity within normal limits. Postoperative changes from prior left frontal craniotomy noted. No acute scalp soft tissue abnormality. Sinuses/Orbits: Globes and orbital soft tissues within normal limits. Paranasal sinuses and mastoid air cells are clear. Inner ear structures normal. MRI CERVICAL SPINE FINDINGS Alignment: Vertebral bodies normally aligned with preservation of the normal cervical lordosis. No listhesis. Vertebrae: Patient is status post ACDF at C4 through C7. Susceptibility artifact from fixation hardware mildly limits evaluation at these levels. Vertebral body heights maintained. No evidence for acute or chronic fracture. Signal intensity within the vertebral body bone marrow within normal limits. No discrete or worrisome osseous lesions. No abnormal marrow edema. No abnormal enhancement. Cord: Signal intensity within the cervical spinal cord is normal. Posterior Fossa, vertebral arteries, paraspinal tissues: Paraspinous and prevertebral soft tissues within normal limits. Normal intravascular flow voids present within the vertebral arteries bilaterally. No abnormal enhancement. Disc levels: C2-C3: Unremarkable. C3-C4: Mild diffuse degenerative disc osteophyte with left greater than right uncovertebral spurring. Posterior disc osteophyte mildly flattens the ventral thecal sac without significant canal stenosis. Moderate left with mild right C4 foraminal stenosis. C4-C5: Prior ACDF. Posterior osseous ridging and uncovertebral hypertrophy. Stable mild canal stenosis. Mild bilateral foraminal narrowing also stable. C5-C6:  Prior ACDF.  No significant canal or foraminal stenosis. C6-C7: Prior ACDF. Minimal  posterior osseous ridging with uncovertebral hypertrophy. No significant canal stenosis. Minimal right foraminal narrowing. C7-T1:  Unremarkable. Visualized upper thoracic spine within normal limits. IMPRESSION: MRI HEAD IMPRESSION: 1. No acute intracranial infarct or other process identified. 2. Postoperative changes from remote left frontal calvarial bone biopsy/craniotomy. 3. Otherwise unremarkable brain MRI. MRI CERVICAL SPINE IMPRESSION: 1. Prior ACDF at C4 through C7. Mild multilevel degenerative spondylolysis, greatest at C3-4 were there is moderate left and mild right C4 foraminal stenosis, stable from previous. 2. Stable mild canal stenosis at C4-5 related to posterior osseous ridging. No new finding within the cervical spine. Electronically Signed   By: Jeannine Boga M.D.   On: 03/10/2017 23:59    EKG: Independently reviewed.  NSR with rate 67; no evidence of acute ischemia, NSCSLT  Assessment/Plan Principal Problem:   Neurological symptoms Active Problems:   Sarcoidosis   Steroid-induced hyperglycemia   Depression   Essential hypertension   Hypothyroidism   Chronic pain   Pertinent labs: Glucose 169, 159 Creatinine 1.13; prior 0.9 on 6/1 Troponin negative WBC 12.4  Neurologic symptoms with h/o sarcoidosis -Patient with h/o sarcoidosis presenting with varied neurologic symptoms -Initial concern for CVA but MRI is negative and so this is quite unlikely -She does have h/o sarcoid and so neurosarcoidosis must be considered - but with negative MRI this also seems unlikely -Neurology has seen the patient and is ordering additional tests and imaging -Will also add ESR and CRP - although the patient is currently on prednisone and so these results may be skewed.  However, if this is an autoimmune phenomenon strong enough to overcome current steroid usage and present, then these tests might be elevated. -She does have a very flat affect and takes  a multitude of psychiatric  medications (Wellbutrin, Valium, Paxil, Requip, Trazodone); it is possible that her current symptoms are a manifestation of her underlying depression -Will place in observation status for further evaluation -Telemetry monitoring -Risk stratification with FLP, A1c; will also check TSH and UDS -PT/OT/ST/Nutrition Consults  HTN -Continue Norvasc, Propranolol  Hypothyroidism -Check TSH -Continue Synthroid at current dose for now  Chronic pain syndrome -I have reviewed this patient in the  Controlled Substances Reporting System.  She is receiving his medications from only one provider (generally) and appears to be taking them as prescribed.   DVT prophylaxis:  Lovenox  Code Status: Full - confirmed with patient/family Family Communication: Son present throughout evaluation Disposition Plan:  Home once clinically improved Consults called: Neurology; PT/OT/ST/Nutrition  Admission status: It is my clinical opinion that referral for OBSERVATION is reasonable and necessary in this patient based on the above information provided. The aforementioned taken together are felt to place the patient at high risk for further clinical deterioration. However it is anticipated that the patient may be medically stable for discharge from the hospital within 24 to 48 hours.    Karmen Bongo MD [facility]  If 7PM-7AM, please contact night-coverage [email or web address] Password [email or web address]  03/11/2017, 1:05 AM

## 2017-03-11 NOTE — Progress Notes (Signed)
RN called the ICU and the ED to see if a nurse could perform the RN bedside swallow screen but no-one is available, Will try again later.

## 2017-03-12 ENCOUNTER — Observation Stay (HOSPITAL_COMMUNITY): Payer: BLUE CROSS/BLUE SHIELD

## 2017-03-12 DIAGNOSIS — H53149 Visual discomfort, unspecified: Secondary | ICD-10-CM | POA: Diagnosis present

## 2017-03-12 DIAGNOSIS — F329 Major depressive disorder, single episode, unspecified: Secondary | ICD-10-CM

## 2017-03-12 DIAGNOSIS — K219 Gastro-esophageal reflux disease without esophagitis: Secondary | ICD-10-CM | POA: Diagnosis present

## 2017-03-12 DIAGNOSIS — Z6832 Body mass index (BMI) 32.0-32.9, adult: Secondary | ICD-10-CM

## 2017-03-12 DIAGNOSIS — N3010 Interstitial cystitis (chronic) without hematuria: Secondary | ICD-10-CM | POA: Diagnosis present

## 2017-03-12 DIAGNOSIS — G8929 Other chronic pain: Secondary | ICD-10-CM | POA: Diagnosis not present

## 2017-03-12 DIAGNOSIS — Z885 Allergy status to narcotic agent status: Secondary | ICD-10-CM | POA: Diagnosis not present

## 2017-03-12 DIAGNOSIS — D8689 Sarcoidosis of other sites: Secondary | ICD-10-CM | POA: Diagnosis present

## 2017-03-12 DIAGNOSIS — E6609 Other obesity due to excess calories: Secondary | ICD-10-CM | POA: Diagnosis not present

## 2017-03-12 DIAGNOSIS — G43109 Migraine with aura, not intractable, without status migrainosus: Secondary | ICD-10-CM | POA: Diagnosis present

## 2017-03-12 DIAGNOSIS — E162 Hypoglycemia, unspecified: Secondary | ICD-10-CM | POA: Diagnosis present

## 2017-03-12 DIAGNOSIS — R1310 Dysphagia, unspecified: Secondary | ICD-10-CM | POA: Diagnosis present

## 2017-03-12 DIAGNOSIS — G2581 Restless legs syndrome: Secondary | ICD-10-CM | POA: Diagnosis present

## 2017-03-12 DIAGNOSIS — K589 Irritable bowel syndrome without diarrhea: Secondary | ICD-10-CM | POA: Diagnosis present

## 2017-03-12 DIAGNOSIS — E039 Hypothyroidism, unspecified: Secondary | ICD-10-CM

## 2017-03-12 DIAGNOSIS — Z981 Arthrodesis status: Secondary | ICD-10-CM | POA: Diagnosis not present

## 2017-03-12 DIAGNOSIS — D869 Sarcoidosis, unspecified: Secondary | ICD-10-CM

## 2017-03-12 DIAGNOSIS — G8194 Hemiplegia, unspecified affecting left nondominant side: Secondary | ICD-10-CM | POA: Diagnosis present

## 2017-03-12 DIAGNOSIS — I10 Essential (primary) hypertension: Secondary | ICD-10-CM | POA: Diagnosis not present

## 2017-03-12 DIAGNOSIS — R2990 Unspecified symptoms and signs involving the nervous system: Secondary | ICD-10-CM | POA: Diagnosis not present

## 2017-03-12 DIAGNOSIS — T8189XA Other complications of procedures, not elsewhere classified, initial encounter: Secondary | ICD-10-CM | POA: Diagnosis not present

## 2017-03-12 DIAGNOSIS — R51 Headache: Secondary | ICD-10-CM | POA: Diagnosis not present

## 2017-03-12 DIAGNOSIS — R4701 Aphasia: Secondary | ICD-10-CM | POA: Diagnosis present

## 2017-03-12 DIAGNOSIS — T380X5A Adverse effect of glucocorticoids and synthetic analogues, initial encounter: Secondary | ICD-10-CM | POA: Diagnosis present

## 2017-03-12 DIAGNOSIS — G894 Chronic pain syndrome: Secondary | ICD-10-CM | POA: Diagnosis present

## 2017-03-12 DIAGNOSIS — R202 Paresthesia of skin: Secondary | ICD-10-CM | POA: Diagnosis present

## 2017-03-12 DIAGNOSIS — J449 Chronic obstructive pulmonary disease, unspecified: Secondary | ICD-10-CM | POA: Diagnosis present

## 2017-03-12 DIAGNOSIS — R200 Anesthesia of skin: Secondary | ICD-10-CM | POA: Diagnosis present

## 2017-03-12 DIAGNOSIS — R262 Difficulty in walking, not elsewhere classified: Secondary | ICD-10-CM | POA: Diagnosis present

## 2017-03-12 LAB — CSF CELL COUNT WITH DIFFERENTIAL
RBC Count, CSF: 1 /mm3 — ABNORMAL HIGH
TUBE #: 4
WBC, CSF: 1 /mm3 (ref 0–5)

## 2017-03-12 LAB — SJOGRENS SYNDROME-A EXTRACTABLE NUCLEAR ANTIBODY: SSA (Ro) (ENA) Antibody, IgG: <0.2 AI (ref 0.0–0.9)

## 2017-03-12 LAB — RHEUMATOID FACTOR

## 2017-03-12 LAB — HEMOGLOBIN A1C
Hgb A1c MFr Bld: 5.8 % — ABNORMAL HIGH (ref 4.8–5.6)
Mean Plasma Glucose: 120 mg/dL

## 2017-03-12 LAB — SJOGRENS SYNDROME-B EXTRACTABLE NUCLEAR ANTIBODY

## 2017-03-12 LAB — GLUCOSE, CSF: GLUCOSE CSF: 76 mg/dL — AB (ref 40–70)

## 2017-03-12 LAB — PROTEIN, CSF: TOTAL PROTEIN, CSF: 14 mg/dL — AB (ref 15–45)

## 2017-03-12 MED ORDER — LIDOCAINE HCL 1 % IJ SOLN
INTRAMUSCULAR | Status: AC
  Filled 2017-03-12: qty 20

## 2017-03-12 MED ORDER — KETOROLAC TROMETHAMINE 30 MG/ML IJ SOLN
30.0000 mg | Freq: Four times a day (QID) | INTRAMUSCULAR | Status: AC
  Administered 2017-03-12 (×3): 30 mg via INTRAVENOUS
  Filled 2017-03-12 (×3): qty 1

## 2017-03-12 MED ORDER — SODIUM CHLORIDE 0.9 % IV SOLN
500.0000 mg | Freq: Once | INTRAVENOUS | Status: AC
  Administered 2017-03-12: 500 mg via INTRAVENOUS
  Filled 2017-03-12: qty 4

## 2017-03-12 MED ORDER — DIPHENHYDRAMINE HCL 50 MG/ML IJ SOLN
25.0000 mg | Freq: Four times a day (QID) | INTRAMUSCULAR | Status: AC
  Administered 2017-03-12 (×3): 25 mg via INTRAVENOUS
  Filled 2017-03-12 (×3): qty 1

## 2017-03-12 MED ORDER — PROCHLORPERAZINE EDISYLATE 5 MG/ML IJ SOLN
10.0000 mg | Freq: Four times a day (QID) | INTRAMUSCULAR | Status: AC
  Administered 2017-03-12 (×3): 10 mg via INTRAVENOUS
  Filled 2017-03-12 (×3): qty 2

## 2017-03-12 MED ORDER — VALPROATE SODIUM 500 MG/5ML IV SOLN
1000.0000 mg | Freq: Once | INTRAVENOUS | Status: AC
  Administered 2017-03-12: 1000 mg via INTRAVENOUS
  Filled 2017-03-12: qty 10

## 2017-03-12 NOTE — Procedures (Signed)
CLINICAL DATA: [Headache. Sarcoidosis.] EXAM: DIAGNOSTIC LUMBAR PUNCTURE UNDER FLUOROSCOPIC GUIDANCE FLUOROSCOPY TIME:  Fluoroscopy Time: [1 minutes and 23 seconds] Radiation Exposure Index (if provided by the fluoroscopic device): [40.3 mGy] Number of Acquired Spot Images: [None] PROCEDURE: Informed consent was obtained from the patient prior to the procedure, including potential complications of headache, allergy, and pain. With the patient prone, the lower back was prepped with Betadine. 1% Lidocaine was used for local anesthesia. Lumbar puncture was performed at the [L2-3] level using a [20] gauge needle with return of [clear] CSF with an opening pressure of [8] cm water. [8] ml of CSF were obtained for laboratory studies. The patient tolerated the procedure well and there were no apparent complications. IMPRESSION: [Non complicated lumbar puncture, as detailed above.]

## 2017-03-12 NOTE — Progress Notes (Signed)
Spoke with pt concerning HH, pt states that she will not need any HH at present time. However asked for 4 wheel walker.

## 2017-03-12 NOTE — Progress Notes (Signed)
[facility] PROGRESS NOTE  [name] FYB:[id number] DOB: 01/30/1966 DOA: 03/10/2017 PCP: Maurice Small, MD  Interim summary and HPI 51 y.o. female with medical history significant of sarcoidosis; RLS; interstitial cystitis; IBS; hypothyroidism; HSV; depression; and chronic pain presenting with various neurologic symptoms.  For the last couple of weeks she has had a lot of weakness.  Has required the walker for ambulation.  Her back, hips, knees, legs feel very heavy, like lead.  Feels like a pinball, can't walk well.  Severe headaches, numbness and tingling on the left side of her face.  It is frequent but not there all the time.  Occasional trouble swallowing liquids, particularly cold liquids.  Difficulty with speech - slurring, some expressive apahsia. Patient also complaining of HA's and associated photophobia and phonophobia.   Assessment/Plan: 1-left side HA's: complicated migraine -patient presented with multiple neurologic deficits, which has been present for the last 2-3 weeks -CT head and MRI neg for acute abnormalities and stroke -patient w/o any neurologic deficit now, but continue complaining of HA, with photophobia and phonophobia. -neurology is on board and will follow rec's -will give IV depakote and also treatment with toradol, benadryl and compazine  -LP has been recommended and will follow results  2-HTN -stable overall -will continue norvasc and propanolol  3-hypothyroidism  -will continue synthroid   4-chronic pain syndrome -will continue home medication analgesic regimen   5-obesity -Body mass index is 32.21 kg/m. -low calorie diet and exercise discussed with patient   6-hx of sarcoidosis  -will continue chronic prednisone  7-depression -will continue trazodone, valium, paxil and bupropion   8-COPD/asthma -no wheezing -will continue home inhaler/neb regimen   9-RLS -will continue requip  Code Status: Full Family Communication: no family at  bedside  Disposition Plan: will remain inpatient; will continue empiric therapy for migraine as recommended by neurology and will follow results of LP. No stroke seen on CT or MRI.   Consultants:  Neurology  IR  Procedures:  LP: 7/13  Antibiotics:  None   HPI/Subjective: Afebrile, no nausea, no vomiting. Patient complaining of HA's. denies CP  Objective: Vitals:   03/12/17 0619 03/12/17 0737  BP: (!) 144/89   Pulse: 61 72  Resp: 16 16  Temp: 98.6 F (37 C)     Intake/Output Summary (Last 24 hours) at 03/12/17 1233 Last data filed at 03/12/17 0600  Gross per 24 hour  Intake          1443.33 ml  Output             1700 ml  Net          -256.67 ml   Filed Weights   03/10/17 2041 03/11/17 0304  Weight: 77.1 kg (170 lb) 91.9 kg (202 lb 9.6 oz)    Exam:   General:  Afebrile, no CP, no SOB. Reports still HA's (affecting her left side); no slurred speech, no left side weakness.  Cardiovascular: S1 and S2, no rubs, no gallops   Respiratory: no wheezing, no crackles  Abdomen: soft, NT, ND, positive BS  Musculoskeletal: no edema, no cyanosis, no clubbing  Neuro:AAOX3, no focal deficit, complaining of HA (especially left side); patient endorses photophobia and phonophobia. No drift.  Data Reviewed: Basic Metabolic Panel:  Recent Labs Lab 03/10/17 2054 03/10/17 2109  NA 140 140  K 4.7 4.7  CL 107 104  CO2 26  --   GLUCOSE 169* 159*  BUN 16 16  CREATININE 1.13* 1.10*  CALCIUM 9.1  --  Liver Function Tests:  Recent Labs Lab 03/10/17 2054  AST 19  ALT 16  ALKPHOS 74  BILITOT 0.3  PROT 7.0  ALBUMIN 4.3   CBC:  Recent Labs Lab 03/10/17 2054 03/10/17 2109  WBC 12.4*  --   NEUTROABS 10.9*  --   HGB 13.1 13.6  HCT 38.4 40.0  MCV 88.3  --   PLT 250  --    Cardiac Enzymes:  Recent Labs Lab 03/11/17 0326 03/11/17 0856 03/11/17 1459  TROPONINI <0.03 <0.03 <0.03   CBG:  Recent Labs Lab 03/10/17 2104  GLUCAP 159*     Studies: Ct Head Wo Contrast  Result Date: 03/10/2017 CLINICAL DATA:  Left side numbness.  Difficulty speaking. EXAM: CT HEAD WITHOUT CONTRAST TECHNIQUE: Contiguous axial images were obtained from the base of the skull through the vertex without intravenous contrast. COMPARISON:  01/08/2015 FINDINGS: Brain: No acute intracranial abnormality. Specifically, no hemorrhage, hydrocephalus, mass lesion, acute infarction, or significant intracranial injury. Vascular: No hyperdense vessel or unexpected calcification. Skull: Prior left frontal craniectomy. No acute calvarial abnormality. Sinuses/Orbits: Visualized paranasal sinuses and mastoids clear. Orbital soft tissues unremarkable. Other: None IMPRESSION: No acute intracranial abnormality. Electronically Signed   By: Rolm Baptise M.D.   On: 03/10/2017 21:20   Mr Jeri Cos And Wo Contrast  Result Date: 03/10/2017 CLINICAL DATA:  Initial evaluation for 1 week history of left-sided numbness. EXAM: MRI HEAD WITHOUT AND WITH CONTRAST MRI CERVICAL SPINE WITHOUT AND WITH CONTRAST TECHNIQUE: Multiplanar, multiecho pulse sequences of the brain and surrounding structures, and cervical spine, to include the craniocervical junction and cervicothoracic junction, were obtained without and with intravenous contrast. CONTRAST:  57mL MULTIHANCE GADOBENATE DIMEGLUMINE 529 MG/ML IV SOLN COMPARISON:  Prior CT from earlier same day as well as previous MRI from 07/20/2016. FINDINGS: MRI HEAD FINDINGS Brain: Cerebral volume within normal limits for age. Scattered subcentimeter T2/FLAIR hyperintense foci noted within the supratentorial cerebral white matter common nonspecific, but felt to be within normal limits for age. No abnormal foci of restricted diffusion to suggest acute or subacute ischemia. Gray-white matter differentiation well maintained. No evidence for chronic infarction. No evidence for acute or chronic intracranial hemorrhage. No mass lesion, midline shift or mass  effect. Ventricles normal in size without evidence for hydrocephalus. No extra-axial fluid collection. Major dural sinuses are grossly patent. No abnormal enhancement. Pituitary gland suprasellar region within normal limits. Vascular: Major intracranial vascular flow voids are maintained. Skull and upper cervical spine: Craniocervical junction normal. Bone marrow signal intensity within normal limits. Postoperative changes from prior left frontal craniotomy noted. No acute scalp soft tissue abnormality. Sinuses/Orbits: Globes and orbital soft tissues within normal limits. Paranasal sinuses and mastoid air cells are clear. Inner ear structures normal. MRI CERVICAL SPINE FINDINGS Alignment: Vertebral bodies normally aligned with preservation of the normal cervical lordosis. No listhesis. Vertebrae: Patient is status post ACDF at C4 through C7. Susceptibility artifact from fixation hardware mildly limits evaluation at these levels. Vertebral body heights maintained. No evidence for acute or chronic fracture. Signal intensity within the vertebral body bone marrow within normal limits. No discrete or worrisome osseous lesions. No abnormal marrow edema. No abnormal enhancement. Cord: Signal intensity within the cervical spinal cord is normal. Posterior Fossa, vertebral arteries, paraspinal tissues: Paraspinous and prevertebral soft tissues within normal limits. Normal intravascular flow voids present within the vertebral arteries bilaterally. No abnormal enhancement. Disc levels: C2-C3: Unremarkable. C3-C4: Mild diffuse degenerative disc osteophyte with left greater than right uncovertebral spurring. Posterior disc osteophyte mildly flattens  the ventral thecal sac without significant canal stenosis. Moderate left with mild right C4 foraminal stenosis. C4-C5: Prior ACDF. Posterior osseous ridging and uncovertebral hypertrophy. Stable mild canal stenosis. Mild bilateral foraminal narrowing also stable. C5-C6:  Prior ACDF.   No significant canal or foraminal stenosis. C6-C7: Prior ACDF. Minimal posterior osseous ridging with uncovertebral hypertrophy. No significant canal stenosis. Minimal right foraminal narrowing. C7-T1:  Unremarkable. Visualized upper thoracic spine within normal limits. IMPRESSION: MRI HEAD IMPRESSION: 1. No acute intracranial infarct or other process identified. 2. Postoperative changes from remote left frontal calvarial bone biopsy/craniotomy. 3. Otherwise unremarkable brain MRI. MRI CERVICAL SPINE IMPRESSION: 1. Prior ACDF at C4 through C7. Mild multilevel degenerative spondylolysis, greatest at C3-4 were there is moderate left and mild right C4 foraminal stenosis, stable from previous. 2. Stable mild canal stenosis at C4-5 related to posterior osseous ridging. No new finding within the cervical spine. Electronically Signed   By: Jeannine Boga M.D.   On: 03/10/2017 23:59   Mr Cervical Spine W Or Wo Contrast  Result Date: 03/10/2017 CLINICAL DATA:  Initial evaluation for 1 week history of left-sided numbness. EXAM: MRI HEAD WITHOUT AND WITH CONTRAST MRI CERVICAL SPINE WITHOUT AND WITH CONTRAST TECHNIQUE: Multiplanar, multiecho pulse sequences of the brain and surrounding structures, and cervical spine, to include the craniocervical junction and cervicothoracic junction, were obtained without and with intravenous contrast. CONTRAST:  49mL MULTIHANCE GADOBENATE DIMEGLUMINE 529 MG/ML IV SOLN COMPARISON:  Prior CT from earlier same day as well as previous MRI from 07/20/2016. FINDINGS: MRI HEAD FINDINGS Brain: Cerebral volume within normal limits for age. Scattered subcentimeter T2/FLAIR hyperintense foci noted within the supratentorial cerebral white matter common nonspecific, but felt to be within normal limits for age. No abnormal foci of restricted diffusion to suggest acute or subacute ischemia. Gray-white matter differentiation well maintained. No evidence for chronic infarction. No evidence for acute  or chronic intracranial hemorrhage. No mass lesion, midline shift or mass effect. Ventricles normal in size without evidence for hydrocephalus. No extra-axial fluid collection. Major dural sinuses are grossly patent. No abnormal enhancement. Pituitary gland suprasellar region within normal limits. Vascular: Major intracranial vascular flow voids are maintained. Skull and upper cervical spine: Craniocervical junction normal. Bone marrow signal intensity within normal limits. Postoperative changes from prior left frontal craniotomy noted. No acute scalp soft tissue abnormality. Sinuses/Orbits: Globes and orbital soft tissues within normal limits. Paranasal sinuses and mastoid air cells are clear. Inner ear structures normal. MRI CERVICAL SPINE FINDINGS Alignment: Vertebral bodies normally aligned with preservation of the normal cervical lordosis. No listhesis. Vertebrae: Patient is status post ACDF at C4 through C7. Susceptibility artifact from fixation hardware mildly limits evaluation at these levels. Vertebral body heights maintained. No evidence for acute or chronic fracture. Signal intensity within the vertebral body bone marrow within normal limits. No discrete or worrisome osseous lesions. No abnormal marrow edema. No abnormal enhancement. Cord: Signal intensity within the cervical spinal cord is normal. Posterior Fossa, vertebral arteries, paraspinal tissues: Paraspinous and prevertebral soft tissues within normal limits. Normal intravascular flow voids present within the vertebral arteries bilaterally. No abnormal enhancement. Disc levels: C2-C3: Unremarkable. C3-C4: Mild diffuse degenerative disc osteophyte with left greater than right uncovertebral spurring. Posterior disc osteophyte mildly flattens the ventral thecal sac without significant canal stenosis. Moderate left with mild right C4 foraminal stenosis. C4-C5: Prior ACDF. Posterior osseous ridging and uncovertebral hypertrophy. Stable mild canal  stenosis. Mild bilateral foraminal narrowing also stable. C5-C6:  Prior ACDF.  No significant canal or foraminal  stenosis. C6-C7: Prior ACDF. Minimal posterior osseous ridging with uncovertebral hypertrophy. No significant canal stenosis. Minimal right foraminal narrowing. C7-T1:  Unremarkable. Visualized upper thoracic spine within normal limits. IMPRESSION: MRI HEAD IMPRESSION: 1. No acute intracranial infarct or other process identified. 2. Postoperative changes from remote left frontal calvarial bone biopsy/craniotomy. 3. Otherwise unremarkable brain MRI. MRI CERVICAL SPINE IMPRESSION: 1. Prior ACDF at C4 through C7. Mild multilevel degenerative spondylolysis, greatest at C3-4 were there is moderate left and mild right C4 foraminal stenosis, stable from previous. 2. Stable mild canal stenosis at C4-5 related to posterior osseous ridging. No new finding within the cervical spine. Electronically Signed   By: Jeannine Boga M.D.   On: 03/10/2017 23:59    Scheduled Meds: . amLODipine  5 mg Oral QHS  . aspirin EC  81 mg Oral Daily  . buPROPion  300 mg Oral QHS  . diazepam  2 mg Oral QHS  . ketorolac  30 mg Intravenous Q6H   And  . diphenhydrAMINE  25 mg Intravenous Q6H   And  . prochlorperazine  10 mg Intravenous Q6H  . fluticasone furoate-vilanterol  1 puff Inhalation Daily  . levothyroxine  137 mcg Oral QAC breakfast  . pantoprazole  40 mg Oral Daily  . PARoxetine  40 mg Oral QHS  . predniSONE  20 mg Oral Q breakfast  . propranolol  40 mg Oral BID  . rOPINIRole  2 mg Oral QHS  . valACYclovir  500 mg Oral QHS   Continuous Infusions: . sodium chloride 50 mL/hr at 03/11/17 0903  . valproate sodium      Principal Problem:   Neurological symptoms Active Problems:   Sarcoidosis   Steroid-induced hyperglycemia   Depression   Essential hypertension   Hypothyroidism   Chronic pain    Time spent: 25 minutes     Barton Dubois  [facility] Pager [phone]. If 7PM-7AM,  please contact night-coverage at [email or web address], password [facility] 03/12/2017, 12:33 PM  LOS: 0 days

## 2017-03-12 NOTE — Progress Notes (Signed)
Physical Therapy Treatment Patient Details Name: [name] MRN: [id number] DOB: 17-Dec-1965 Today's Date: 03/12/2017    History of Present Illness 51 yo female admitted with neurological symptoms, L sided numbness/weakness, aphasia. Hx of sarcoidosis, RLS, OA, chronic HAs, ankylosing spondylitits, back surgery, chronic back pain, HSV, depression. MRI and CT negative    PT Comments    Pt ambulated in hallway with RW and able to tolerate improved distance today however continues to reports headache (RN notified).   Follow Up Recommendations  Home health PT;Supervision/Assistance - 24 hour     Equipment Recommendations  Other (comment) (4 wheeled RW due to fatigue)    Recommendations for Other Services       Precautions / Restrictions Precautions Precautions: Fall Precaution Comments: pt reports multiple falls    Mobility  Bed Mobility Overal bed mobility: Modified Independent                Transfers Overall transfer level: Needs assistance Equipment used: Rolling walker (2 wheeled) Transfers: Sit to/from Stand Sit to Stand: Min guard         General transfer comment: close guard for safety. VCs hand placement  Ambulation/Gait Ambulation/Gait assistance: Min guard Ambulation Distance (Feet): 160 Feet Assistive device: Rolling walker (2 wheeled) Gait Pattern/deviations: Step-through pattern;Decreased stride length     General Gait Details: slow pace, pt appears unsteady however no overt LOB today, utilized RW for more support, pt tolerated improved distance   Science writer    Modified Rankin (Stroke Patients Only)       Balance Overall balance assessment: History of Falls (pt reports her falls in the past are due to her legs "giving out")                                          Cognition Arousal/Alertness: Awake/alert Behavior During Therapy: WFL for tasks assessed/performed Overall Cognitive  Status: Within Functional Limits for tasks assessed                                        Exercises Other Exercises Other Exercises: discussed pt performing heel slides, hip abduction, ankle pumps, and SLR (when pt in bed) which she demonstrated one of each in supine x10 each LE but especially L LE since pt reports this side is weaker    General Comments        Pertinent Vitals/Pain Pain Assessment: 0-10 Pain Score:  (not rated but reports pain better then yesterday) Pain Location: headache Pain Descriptors / Indicators: Headache Pain Intervention(s): Repositioned;Limited activity within patient's tolerance    Home Living                      Prior Function            PT Goals (current goals can now be found in the care plan section) Progress towards PT goals: Progressing toward goals    Frequency    Min 3X/week      PT Plan Current plan remains appropriate    Co-evaluation              AM-PAC PT "6 Clicks" Daily Activity  Outcome Measure  Difficulty turning over in bed (including adjusting bedclothes, sheets  and blankets)?: A Little Difficulty moving from lying on back to sitting on the side of the bed? : A Little Difficulty sitting down on and standing up from a chair with arms (e.g., wheelchair, bedside commode, etc,.)?: A Little Help needed moving to and from a bed to chair (including a wheelchair)?: A Little Help needed walking in hospital room?: A Little Help needed climbing 3-5 steps with a railing? : A Little 6 Click Score: 18    End of Session Equipment Utilized During Treatment: Gait belt Activity Tolerance: Patient limited by fatigue Patient left: in bed;with call bell/phone within reach Nurse Communication: Mobility status PT Visit Diagnosis: Muscle weakness (generalized) (M62.81);Difficulty in walking, not elsewhere classified (R26.2)     Time: 9211-9417 PT Time Calculation (min) (ACUTE ONLY): 11 min  Charges:   $Gait Training: 8-22 mins                    G Codes:       [name], PT, DPT 03/12/2017 Pager: [phone]  [name] 03/12/2017, 12:28 PM

## 2017-03-12 NOTE — Progress Notes (Signed)
Subjective: Still having left sided HA behind her eye --not throbbing but with photophobia and phonophobia. Some tingling on the left side of her head. Migraine cocktail helped in past.   Exam: Vitals:   03/12/17 0619 03/12/17 0737  BP: (!) 144/89   Pulse: 61 72  Resp: 16 16  Temp: 98.6 F (37 C)     HEENT-  Normocephalic, no lesions, without obvious abnormality.  Normal external eye and conjunctiva.  Normal TM's bilaterally.  Normal auditory canals and external ears. Normal external nose, mucus membranes and septum.  Normal pharynx.    Neuro:  CN: Pupils are equal and round. They are symmetrically reactive from 3-->2 mm. EOMI without nystagmus. Facial sensation is intact to light touch. Face is symmetric at rest with normal strength and mobility. Hearing is intact to conversational voice. Palate elevates symmetrically and uvula is midline. Voice is normal in tone, pitch and quality. Bilateral SCM and trapezii are 5/5. Tongue is midline with normal bulk and mobility.  Motor: Normal bulk, tone, and strength. 5/5 throughout. No drift.  Sensation: Intact to light touch.  DTRs: 2+, symmetric  Toes downgoing bilaterally. No pathologic reflexes.  Coordination: Finger-to-nose and heel-to-shin are without dysmetria     Pertinent Labs/Diagnostics: none  Etta Quill PA-C [facility] [phone]  Impression: likely migraine HA as it did respond to the migraine cocktail. However due to continued HA will restart cocktail and would recommend LP by IR today.  Unsure why this has not been done at this time.  Will follow LP results.       03/12/2017, 9:06 AM

## 2017-03-13 DIAGNOSIS — R51 Headache: Secondary | ICD-10-CM

## 2017-03-13 LAB — BASIC METABOLIC PANEL
ANION GAP: 9 (ref 5–15)
BUN: 19 mg/dL (ref 6–20)
CHLORIDE: 102 mmol/L (ref 101–111)
CO2: 25 mmol/L (ref 22–32)
Calcium: 9 mg/dL (ref 8.9–10.3)
Creatinine, Ser: 0.87 mg/dL (ref 0.44–1.00)
GFR calc Af Amer: >60 mL/min (ref >60)
GLUCOSE: 218 mg/dL — AB (ref 65–99)
POTASSIUM: 4.1 mmol/L (ref 3.5–5.1)
SODIUM: 136 mmol/L (ref 135–145)

## 2017-03-13 LAB — ANTINUCLEAR ANTIBODIES, IFA: ANA Ab, IFA: POSITIVE — AB

## 2017-03-13 LAB — CBC WITH DIFFERENTIAL/PLATELET
BASOS ABS: 0 10*3/uL (ref 0.0–0.1)
Basophils Relative: 0 %
EOS PCT: 0 %
Eosinophils Absolute: 0 10*3/uL (ref 0.0–0.7)
HEMATOCRIT: 38.4 % (ref 36.0–46.0)
Hemoglobin: 13.3 g/dL (ref 12.0–15.0)
LYMPHS ABS: 1 10*3/uL (ref 0.7–4.0)
LYMPHS PCT: 4 %
MCH: 30.6 pg (ref 26.0–34.0)
MCHC: 34.6 g/dL (ref 30.0–36.0)
MCV: 88.3 fL (ref 78.0–100.0)
Monocytes Absolute: 0.7 10*3/uL (ref 0.1–1.0)
Monocytes Relative: 3 %
NEUTROS ABS: 22.3 10*3/uL — AB (ref 1.7–7.7)
Neutrophils Relative %: 93 %
Platelets: 241 10*3/uL (ref 150–400)
RBC: 4.35 MIL/uL (ref 3.87–5.11)
RDW: 14.3 % (ref 11.5–15.5)
WBC: 24 10*3/uL — AB (ref 4.0–10.5)

## 2017-03-13 LAB — FANA STAINING PATTERNS: Speckled Pattern: 1:80 {titer}

## 2017-03-13 LAB — OLIGOCLONAL BANDS, CSF + SERM

## 2017-03-13 MED ORDER — TOPIRAMATE 25 MG PO TABS: ORAL_TABLET | ORAL | 2 refills | Status: DC

## 2017-03-13 NOTE — Progress Notes (Signed)
NEUROLOGY PROGRESS NOTE  S:// Seen and examined this AM. Headache better on migraine cocktail but not completely resolved. Has tried Imitrex in the past but did not tolerate. Fioricet was recommended, but insurance doesn't cover. Is on chronic opiates for Ank.spondylytis.   O:// AAOx3 Speech clear Naming comprehension and repetition intact. CN: PERRL, EOMI, VFF, Face symmetric. Motor: 5/5 all over. Normal tone and bulk. Sensory: intact to LT all over. DTR: 2+ all over Coord: FNF intact b/l  LP unremarkable for inflammatory markers thus far. 1 cell, protein 14, glu 76. No infectious markers seen thus far.  A: 51/F with PMH of sarcoid, Ankylosing spondylytis, with waxing/waning left sided headache and paresthesisas.  Imp Migraine v. Atypical headaches. Less likely neurosarcoid with normal MRI and LP   Recs: Topamax 25qhs for 1 week, followed by 25 BID for 1week and increasing by 25mg  increments to total of 50 BID goal dose.  Excedrin for symptomatic treatment  Has OP neuro appt on Tuesday with [facility].   Imitrex not tolerated well and Fioricet not covered by insurance.  Please call with questions   Amie Portland, MD [facility] [phone]  If 7pm to 7am, please call on call as listed on AMION.

## 2017-03-13 NOTE — Discharge Summary (Signed)
Physician Discharge Summary  [name] QQP:[id number] DOB: October 24, 1965 DOA: 03/10/2017  PCP: Maurice Small, MD  Admit date: 03/10/2017 Discharge date: 03/13/2017  Time spent: 35 minutes  Recommendations for Outpatient Follow-up:  1. Repeat basic metabolic panel to follow electrolytes and renal function 2. Reassess patient's headaches resolution and response to the use of Topamax. 3. Close follow-up of her CBGs and hemoglobin A1c in the future.   Discharge Diagnoses:  Principal Problem:   Neurological symptoms Active Problems:   Sarcoid   Steroid-induced hyperglycemia   Depression   Essential hypertension   Hypothyroidism   Chronic pain   Complicated migraine   Class 1 obesity due to excess calories with body mass index (BMI) of 32.0 to 32.9 in adult   Discharge Condition: Stable and improved. Patient discharged home with instructions to follow with PCP in 10 days and to maintain appointment on July/17/18 with neurology at [facility].  Diet recommendation: Heart healthy diet and notify carbohydrate.  Filed Weights   03/10/17 2041 03/11/17 0304  Weight: 77.1 kg (170 lb) 91.9 kg (202 lb 9.6 oz)    Brief History of present illness:  51 y.o.femalewith medical history significant of sarcoidosis; RLS; interstitial cystitis; IBS; hypothyroidism; HSV; depression; and chronic pain presenting with various neurologic symptoms. For the last couple of weeks she has had a lot of weakness. Has required the walker for ambulation. Her back, hips, knees, legs feel very heavy, like lead. Feels like a pinball, can't walk well. Severe headaches, numbness and tingling on the left side of her face. It is frequent but not there all the time. Occasional trouble swallowing liquids, particularly cold liquids. Difficulty with speech - slurring, some expressive apahsia. Patient also complaining of HA's and associated photophobia and phonophobia.   Hospital Course:  1-left side HA's:  complicated migraine -patient presented with multiple neurologic deficits, which has been present for the last 2-3 weeks -CT head and MRI neg for acute intracranial abnormalities and stroke -patient w/o any neurologic deficit at the moment of discharge, but continue complaining of intermittent HA, with associated left side of her face tingling, numbness, photophobia and phonophobia. -Appreciate neurology service inputs and recommendations; they felt the patient's condition most likely associated with complicated migraines.  -She received acute treatment with IV cocktails including Depakote, Toradol, Benadryl and Compazine.  -Patient's headache improved at discharge given that she was a still having some discomfort.  -Preventive medicine has been recommended using Topamax and as needed Excedrin for pain control.  -LP didn't show any significant inflammatory abnormalities on the CSF fluid examination making neurosarcoidosis less likely.  2-HTN -stable overall -will continue norvasc and propanolol  3-hypothyroidism  -will continue synthroid   4-chronic pain syndrome -will continue home medication analgesic regimen  -Patient will benefit of decrease in the amount of narcotics, at this medication can also promote exacerbation and uncontrolled symptoms from migrainous component.  5-obesity -Body mass index is 32.21 kg/m. -low calorie diet and exercise discussed with patient   6-hx of sarcoidosis  -will continue chronic prednisone  7-depression -will continue trazodone, valium, paxil and bupropion   8-COPD/asthma -no wheezing -will continue home inhaler/neb regimen   9-RLS -will continue requip  10-steroid induced hyperglycemia -Patient will need close follow-up with her PCP for initiation on hypoglycemic regimen as needed -Will recommend close follow-up of her CBGs and A1c -Patient advise to follow modified carbohydrate diet and maintain adequate  hydration.  Procedures:  LP: 7/13  Consultations:  Neurology   IR  Discharge Exam: Vitals:  03/13/17 1050 03/13/17 1356  BP: 136/78 112/65  Pulse: 68 84  Resp: 18 18  Temp: 98.4 F (36.9 C) 98.3 F (36.8 C)    General:  Afebrile, no CP, no SOB. Reports still HA's (affecting her left side, causing tingling and intermittent numbness); no slurred speech, no left side weakness.  Cardiovascular: S1 and S2, no rubs, no gallops   Respiratory: no wheezing, no crackles  Abdomen: soft, NT, ND, positive BS  Musculoskeletal: no edema, no cyanosis, no clubbing  Neuro:AAOX3, no focal deficit, complaining of HA (especially left side); patient endorses photophobia and phonophobia. No drift.   Discharge Instructions   Discharge Instructions    Discharge instructions    Complete by:  As directed    Take medications as prescribed  Please follow up with PCP in 10 days Follow up with neurology as already schedule Use excedrim for Headaches     Discharge Medication List as of 03/13/2017  4:00 PM    START taking these medications   Details  topiramate (TOPAMAX) 25 MG tablet Take one tablet by mouth daily(at bedtime) X 1 week; then 1 tablet twice a day for 1 week; the 1 tablet in the morning and 2 tablets at bedtime for 2 weeks; then take 2 tablets by mouth twice a day., Print      CONTINUE these medications which have NOT CHANGED   Details  albuterol (PROVENTIL HFA;VENTOLIN HFA) 108 (90 BASE) MCG/ACT inhaler Inhale 2 puffs into the lungs every 6 (six) hours as needed. For shortness of breath, Starting 04/25/2013, Until Discontinued, Normal    amLODipine (NORVASC) 5 MG tablet Take 5 mg by mouth at bedtime. , Historical Med    BREO ELLIPTA 100-25 MCG/INH AEPB INHALE 1 PUFF INTO THE LUNGS DAILY., Starting Thu 08/20/2016, Normal    buPROPion (WELLBUTRIN XL) 300 MG 24 hr tablet Take 300 mg by mouth at bedtime. Reported on 08/20/2015, Starting Tue 01/15/2015, Historical Med     diazepam (VALIUM) 2 MG tablet Take 2 mg by mouth at bedtime., Until Discontinued, Historical Med    HYDROmorphone (DILAUDID) 4 MG tablet Take 4 mg by mouth 2 (two) times daily as needed for pain., Starting Mon 11/16/2016, Historical Med    levothyroxine (SYNTHROID, LEVOTHROID) 137 MCG tablet Take 137 mcg by mouth every morning. , Until Discontinued, Historical Med    omeprazole (PRILOSEC) 40 MG capsule Take 40 mg by mouth at bedtime., Until Discontinued, Historical Med    ondansetron (ZOFRAN) 4 MG tablet Take 1 tablet (4 mg total) by mouth every 8 (eight) hours as needed for nausea or vomiting., Starting Fri 01/29/2017, Normal    PARoxetine (PAXIL) 40 MG tablet Take 40 mg by mouth at bedtime., Starting 11/29/2015, Until Discontinued, Historical Med    !! predniSONE (DELTASONE) 20 MG tablet TAKE 2 TABS DAILY X 7 DAYS, THEN 1 TAB DAILY X 7 DAYS, Historical Med    propranolol (INDERAL) 40 MG tablet Take 40 mg by mouth 2 (two) times daily., Starting Tue 12/01/2016, Historical Med    rOPINIRole (REQUIP) 2 MG tablet Take 2 mg by mouth at bedtime., Until Discontinued, Historical Med    traMADol (ULTRAM) 50 MG tablet Take 1 tablet (50 mg total) by mouth every 6 (six) hours as needed., Starting 01/08/2015, Until Discontinued, Print    traZODone (DESYREL) 50 MG tablet Take 50 mg by mouth at bedtime., Until Discontinued, Historical Med    Turmeric 500 MG TABS Take 500 mg by mouth at bedtime., Historical Med  valACYclovir (VALTREX) 500 MG tablet Take 500 mg by mouth at bedtime., Until Discontinued, Historical Med    aspirin EC 81 MG tablet Take 81 mg by mouth daily. , Historical Med    nitroGLYCERIN (NITROSTAT) 0.4 MG SL tablet Place 0.4 mg under the tongue every 5 (five) minutes as needed for chest pain. , Starting Fri 10/30/2016, Historical Med    !! predniSONE (DELTASONE) 5 MG tablet Take 1 tablet (5 mg total) by mouth daily with breakfast., Starting Thu 11/19/2016, Normal     !! - Potential  duplicate medications found. Please discuss with provider.    STOP taking these medications     naproxen (NAPROSYN) 500 MG tablet        Allergies  Allergen Reactions  . Humira [Adalimumab] Other (See Comments)    Muscle weakness  . Bactrim [Sulfamethoxazole-Trimethoprim] Other (See Comments)    Fever   . Percocet [Oxycodone-Acetaminophen] Itching  . Vicodin [Hydrocodone-Acetaminophen] Itching   Follow-up Information    Maurice Small, MD. Schedule an appointment as soon as possible for a visit in 10 day(s).   Specialty:  Family Medicine Contact information: [place] [place] [address] [phone]           The results of significant diagnostics from this hospitalization (including imaging, microbiology, ancillary and laboratory) are listed below for reference.    Significant Diagnostic Studies: Ct Head Wo Contrast  Result Date: 03/10/2017 CLINICAL DATA:  Left side numbness.  Difficulty speaking. EXAM: CT HEAD WITHOUT CONTRAST TECHNIQUE: Contiguous axial images were obtained from the base of the skull through the vertex without intravenous contrast. COMPARISON:  01/08/2015 FINDINGS: Brain: No acute intracranial abnormality. Specifically, no hemorrhage, hydrocephalus, mass lesion, acute infarction, or significant intracranial injury. Vascular: No hyperdense vessel or unexpected calcification. Skull: Prior left frontal craniectomy. No acute calvarial abnormality. Sinuses/Orbits: Visualized paranasal sinuses and mastoids clear. Orbital soft tissues unremarkable. Other: None IMPRESSION: No acute intracranial abnormality. Electronically Signed   By: Rolm Baptise M.D.   On: 03/10/2017 21:20   Mr Jeri Cos And Wo Contrast  Result Date: 03/10/2017 CLINICAL DATA:  Initial evaluation for 1 week history of left-sided numbness. EXAM: MRI HEAD WITHOUT AND WITH CONTRAST MRI CERVICAL SPINE WITHOUT AND WITH CONTRAST TECHNIQUE: Multiplanar, multiecho pulse sequences of  the brain and surrounding structures, and cervical spine, to include the craniocervical junction and cervicothoracic junction, were obtained without and with intravenous contrast. CONTRAST:  44mL MULTIHANCE GADOBENATE DIMEGLUMINE 529 MG/ML IV SOLN COMPARISON:  Prior CT from earlier same day as well as previous MRI from 07/20/2016. FINDINGS: MRI HEAD FINDINGS Brain: Cerebral volume within normal limits for age. Scattered subcentimeter T2/FLAIR hyperintense foci noted within the supratentorial cerebral white matter common nonspecific, but felt to be within normal limits for age. No abnormal foci of restricted diffusion to suggest acute or subacute ischemia. Gray-white matter differentiation well maintained. No evidence for chronic infarction. No evidence for acute or chronic intracranial hemorrhage. No mass lesion, midline shift or mass effect. Ventricles normal in size without evidence for hydrocephalus. No extra-axial fluid collection. Major dural sinuses are grossly patent. No abnormal enhancement. Pituitary gland suprasellar region within normal limits. Vascular: Major intracranial vascular flow voids are maintained. Skull and upper cervical spine: Craniocervical junction normal. Bone marrow signal intensity within normal limits. Postoperative changes from prior left frontal craniotomy noted. No acute scalp soft tissue abnormality. Sinuses/Orbits: Globes and orbital soft tissues within normal limits. Paranasal sinuses and mastoid air cells are clear. Inner ear structures normal.  MRI CERVICAL SPINE FINDINGS Alignment: Vertebral bodies normally aligned with preservation of the normal cervical lordosis. No listhesis. Vertebrae: Patient is status post ACDF at C4 through C7. Susceptibility artifact from fixation hardware mildly limits evaluation at these levels. Vertebral body heights maintained. No evidence for acute or chronic fracture. Signal intensity within the vertebral body bone marrow within normal limits. No  discrete or worrisome osseous lesions. No abnormal marrow edema. No abnormal enhancement. Cord: Signal intensity within the cervical spinal cord is normal. Posterior Fossa, vertebral arteries, paraspinal tissues: Paraspinous and prevertebral soft tissues within normal limits. Normal intravascular flow voids present within the vertebral arteries bilaterally. No abnormal enhancement. Disc levels: C2-C3: Unremarkable. C3-C4: Mild diffuse degenerative disc osteophyte with left greater than right uncovertebral spurring. Posterior disc osteophyte mildly flattens the ventral thecal sac without significant canal stenosis. Moderate left with mild right C4 foraminal stenosis. C4-C5: Prior ACDF. Posterior osseous ridging and uncovertebral hypertrophy. Stable mild canal stenosis. Mild bilateral foraminal narrowing also stable. C5-C6:  Prior ACDF.  No significant canal or foraminal stenosis. C6-C7: Prior ACDF. Minimal posterior osseous ridging with uncovertebral hypertrophy. No significant canal stenosis. Minimal right foraminal narrowing. C7-T1:  Unremarkable. Visualized upper thoracic spine within normal limits. IMPRESSION: MRI HEAD IMPRESSION: 1. No acute intracranial infarct or other process identified. 2. Postoperative changes from remote left frontal calvarial bone biopsy/craniotomy. 3. Otherwise unremarkable brain MRI. MRI CERVICAL SPINE IMPRESSION: 1. Prior ACDF at C4 through C7. Mild multilevel degenerative spondylolysis, greatest at C3-4 were there is moderate left and mild right C4 foraminal stenosis, stable from previous. 2. Stable mild canal stenosis at C4-5 related to posterior osseous ridging. No new finding within the cervical spine. Electronically Signed   By: Jeannine Boga M.D.   On: 03/10/2017 23:59   Mr Cervical Spine W Or Wo Contrast  Result Date: 03/10/2017 CLINICAL DATA:  Initial evaluation for 1 week history of left-sided numbness. EXAM: MRI HEAD WITHOUT AND WITH CONTRAST MRI CERVICAL SPINE  WITHOUT AND WITH CONTRAST TECHNIQUE: Multiplanar, multiecho pulse sequences of the brain and surrounding structures, and cervical spine, to include the craniocervical junction and cervicothoracic junction, were obtained without and with intravenous contrast. CONTRAST:  31mL MULTIHANCE GADOBENATE DIMEGLUMINE 529 MG/ML IV SOLN COMPARISON:  Prior CT from earlier same day as well as previous MRI from 07/20/2016. FINDINGS: MRI HEAD FINDINGS Brain: Cerebral volume within normal limits for age. Scattered subcentimeter T2/FLAIR hyperintense foci noted within the supratentorial cerebral white matter common nonspecific, but felt to be within normal limits for age. No abnormal foci of restricted diffusion to suggest acute or subacute ischemia. Gray-white matter differentiation well maintained. No evidence for chronic infarction. No evidence for acute or chronic intracranial hemorrhage. No mass lesion, midline shift or mass effect. Ventricles normal in size without evidence for hydrocephalus. No extra-axial fluid collection. Major dural sinuses are grossly patent. No abnormal enhancement. Pituitary gland suprasellar region within normal limits. Vascular: Major intracranial vascular flow voids are maintained. Skull and upper cervical spine: Craniocervical junction normal. Bone marrow signal intensity within normal limits. Postoperative changes from prior left frontal craniotomy noted. No acute scalp soft tissue abnormality. Sinuses/Orbits: Globes and orbital soft tissues within normal limits. Paranasal sinuses and mastoid air cells are clear. Inner ear structures normal. MRI CERVICAL SPINE FINDINGS Alignment: Vertebral bodies normally aligned with preservation of the normal cervical lordosis. No listhesis. Vertebrae: Patient is status post ACDF at C4 through C7. Susceptibility artifact from fixation hardware mildly limits evaluation at these levels. Vertebral body heights maintained. No evidence for  acute or chronic fracture.  Signal intensity within the vertebral body bone marrow within normal limits. No discrete or worrisome osseous lesions. No abnormal marrow edema. No abnormal enhancement. Cord: Signal intensity within the cervical spinal cord is normal. Posterior Fossa, vertebral arteries, paraspinal tissues: Paraspinous and prevertebral soft tissues within normal limits. Normal intravascular flow voids present within the vertebral arteries bilaterally. No abnormal enhancement. Disc levels: C2-C3: Unremarkable. C3-C4: Mild diffuse degenerative disc osteophyte with left greater than right uncovertebral spurring. Posterior disc osteophyte mildly flattens the ventral thecal sac without significant canal stenosis. Moderate left with mild right C4 foraminal stenosis. C4-C5: Prior ACDF. Posterior osseous ridging and uncovertebral hypertrophy. Stable mild canal stenosis. Mild bilateral foraminal narrowing also stable. C5-C6:  Prior ACDF.  No significant canal or foraminal stenosis. C6-C7: Prior ACDF. Minimal posterior osseous ridging with uncovertebral hypertrophy. No significant canal stenosis. Minimal right foraminal narrowing. C7-T1:  Unremarkable. Visualized upper thoracic spine within normal limits. IMPRESSION: MRI HEAD IMPRESSION: 1. No acute intracranial infarct or other process identified. 2. Postoperative changes from remote left frontal calvarial bone biopsy/craniotomy. 3. Otherwise unremarkable brain MRI. MRI CERVICAL SPINE IMPRESSION: 1. Prior ACDF at C4 through C7. Mild multilevel degenerative spondylolysis, greatest at C3-4 were there is moderate left and mild right C4 foraminal stenosis, stable from previous. 2. Stable mild canal stenosis at C4-5 related to posterior osseous ridging. No new finding within the cervical spine. Electronically Signed   By: Jeannine Boga M.D.   On: 03/10/2017 23:59   Dg Fluoro Guide Lumbar Puncture  Result Date: 03/12/2017 CLINICAL DATA:  Headache.  Sarcoidosis. EXAM: DIAGNOSTIC LUMBAR  PUNCTURE UNDER FLUOROSCOPIC GUIDANCE FLUOROSCOPY TIME:  Fluoroscopy Time:  1 minutes and 23 seconds Radiation Exposure Index (if provided by the fluoroscopic device): 40.3 mGy Number of Acquired Spot Images: None PROCEDURE: Informed consent was obtained from the patient prior to the procedure, including potential complications of headache, allergy, and pain. With the patient prone, the lower back was prepped with Betadine. 1% Lidocaine was used for local anesthesia. Lumbar puncture was performed at the L2-3 level using a 20 gauge needle with return of clear CSF with an opening pressure of 8 cm water. 8 ml of CSF were obtained for laboratory studies. The patient tolerated the procedure well and there were no apparent complications. IMPRESSION: Non complicated lumbar puncture, as detailed above. Electronically Signed   By: Abigail Miyamoto M.D.   On: 03/12/2017 15:43    Microbiology: Recent Results (from the past 240 hour(s))  CSF culture     Status: None (Preliminary result)   Collection Time: 03/12/17  3:28 PM  Result Value Ref Range Status   Specimen Description CSF  Final   Special Requests NONE  Final   Gram Stain   Final    WBC PRESENT, PREDOMINANTLY MONONUCLEAR NO ORGANISMS SEEN CYTOSPIN SMEAR    Culture   Final    NO GROWTH 1 DAY Performed at [facility], [address] [place], [place] [address]    Report Status PENDING  Incomplete     Labs: Basic Metabolic Panel:  Recent Labs Lab 03/10/17 2054 03/10/17 2109 03/13/17 1027  NA 140 140 136  K 4.7 4.7 4.1  CL 107 104 102  CO2 26  --  25  GLUCOSE 169* 159* 218*  BUN 16 16 19   CREATININE 1.13* 1.10* 0.87  CALCIUM 9.1  --  9.0   Liver Function Tests:  Recent Labs Lab 03/10/17 2054  AST 19  ALT 16  ALKPHOS  74  BILITOT 0.3  PROT 7.0  ALBUMIN 4.3   CBC:  Recent Labs Lab 03/10/17 2054 03/10/17 2109 03/13/17 1027  WBC 12.4*  --  24.0*  NEUTROABS 10.9*  --  22.3*  HGB 13.1 13.6 13.3  HCT 38.4 40.0 38.4   MCV 88.3  --  88.3  PLT 250  --  241   Cardiac Enzymes:  Recent Labs Lab 03/11/17 0326 03/11/17 0856 03/11/17 1459  TROPONINI <0.03 <0.03 <0.03    CBG:  Recent Labs Lab 03/10/17 2104  GLUCAP 159*     Signed:  Barton Dubois MD.  [facility] 03/13/2017, 5:36 PM

## 2017-03-16 DIAGNOSIS — M961 Postlaminectomy syndrome, not elsewhere classified: Secondary | ICD-10-CM | POA: Diagnosis not present

## 2017-03-16 DIAGNOSIS — D869 Sarcoidosis, unspecified: Secondary | ICD-10-CM | POA: Diagnosis not present

## 2017-03-16 DIAGNOSIS — M455 Ankylosing spondylitis of thoracolumbar region: Secondary | ICD-10-CM | POA: Diagnosis not present

## 2017-03-16 LAB — CSF CULTURE

## 2017-03-16 LAB — CSF CULTURE W GRAM STAIN: Culture: NO GROWTH

## 2017-03-17 ENCOUNTER — Other Ambulatory Visit: Payer: Self-pay | Admitting: Orthopedic Surgery

## 2017-03-17 ENCOUNTER — Emergency Department (HOSPITAL_COMMUNITY)
Admission: EM | Admit: 2017-03-17 | Discharge: 2017-03-18 | Disposition: A | Discharge status: Home / Self Care | Payer: BLUE CROSS/BLUE SHIELD | Attending: Emergency Medicine | Admitting: Emergency Medicine

## 2017-03-17 DIAGNOSIS — Z87891 Personal history of nicotine dependence: Secondary | ICD-10-CM | POA: Diagnosis not present

## 2017-03-17 DIAGNOSIS — T7840XA Allergy, unspecified, initial encounter: Secondary | ICD-10-CM | POA: Diagnosis not present

## 2017-03-17 DIAGNOSIS — J45909 Unspecified asthma, uncomplicated: Secondary | ICD-10-CM | POA: Diagnosis not present

## 2017-03-17 DIAGNOSIS — Z79899 Other long term (current) drug therapy: Secondary | ICD-10-CM | POA: Diagnosis not present

## 2017-03-17 DIAGNOSIS — E039 Hypothyroidism, unspecified: Secondary | ICD-10-CM | POA: Insufficient documentation

## 2017-03-17 DIAGNOSIS — Z7982 Long term (current) use of aspirin: Secondary | ICD-10-CM | POA: Diagnosis not present

## 2017-03-17 DIAGNOSIS — I10 Essential (primary) hypertension: Secondary | ICD-10-CM | POA: Insufficient documentation

## 2017-03-17 DIAGNOSIS — R200 Anesthesia of skin: Secondary | ICD-10-CM | POA: Diagnosis not present

## 2017-03-17 DIAGNOSIS — G4489 Other headache syndrome: Secondary | ICD-10-CM | POA: Diagnosis not present

## 2017-03-17 DIAGNOSIS — R52 Pain, unspecified: Secondary | ICD-10-CM | POA: Diagnosis not present

## 2017-03-17 MED ORDER — PREDNISONE 20 MG PO TABS
60.0000 mg | ORAL_TABLET | Freq: Once | ORAL | Status: AC
  Administered 2017-03-17: 60 mg via ORAL
  Filled 2017-03-17: qty 3

## 2017-03-17 MED ORDER — FAMOTIDINE 20 MG PO TABS
20.0000 mg | ORAL_TABLET | Freq: Once | ORAL | Status: AC
  Administered 2017-03-17: 20 mg via ORAL
  Filled 2017-03-17: qty 1

## 2017-03-17 MED ORDER — HYDROMORPHONE HCL 1 MG/ML IJ SOLN
2.0000 mg | Freq: Once | INTRAMUSCULAR | Status: AC
  Administered 2017-03-17: 2 mg via INTRAMUSCULAR
  Filled 2017-03-17: qty 2

## 2017-03-17 MED ORDER — ONDANSETRON 8 MG PO TBDP
8.0000 mg | ORAL_TABLET | Freq: Once | ORAL | Status: AC
  Administered 2017-03-17: 8 mg via ORAL
  Filled 2017-03-17: qty 1

## 2017-03-17 MED ORDER — IBUPROFEN 200 MG PO TABS
600.0000 mg | ORAL_TABLET | Freq: Once | ORAL | Status: AC
  Administered 2017-03-17: 600 mg via ORAL
  Filled 2017-03-17: qty 3

## 2017-03-17 NOTE — ED Triage Notes (Signed)
Pt started new medication Lyric husband reports tongue swelling and hives but no of this was present on EMS arrival. Husband further reports tongue had to be manually pull forward to open air way but she was breathing WNL on EMS arrival. Also c/o headache and nausea 4mg  po Zofran given. VS BP 146/95 p= 78 r= 18 sat 94% CBG=129 on scene

## 2017-03-17 NOTE — ED Provider Notes (Signed)
[facility]-EMERGENCY DEPT Provider Note   CSN: [id number] Arrival date & time: 03/17/17  1946     History   Chief Complaint Chief Complaint  Patient presents with  . Allergic Reaction    HPI [name] is a 51 y.o. female.  She is here for evaluation of difficulty swallowing, swelling of the tongue, and itchy throat, immediate onset after taking Lyrica about 4:30 PM today.  She took a single loratadine pill, and after that her symptoms progressed.  At one point, her family member had to "pull her tongue back out of her throat."  EMS arrived, and found her to be breathing normally with normal oxygen saturation and transferred her here.  They treated her with Zofran, 1 for nausea.  She denies recent fever, chills, chest pain, nausea, vomiting, weakness or dizziness.  She was prescribed Lyrica, for back pain.  She is being evaluated for possible neurosarcoidosis.  She was recently hospitalized and had extensive neurologic examination.  She saw her neurologist, as an outpatient yesterday who ordered the Lyrica.  There are no other known modifying factors.  HPI  Past Medical History:  Diagnosis Date  . Ankylosing spondylitis (HCC)   . Anxiety   . Arthritis    "back, hands, neck" (09/18/2014)  . Asthma   . Chronic back pain    "whole spine"  . Chronic bronchitis (HCC)    "get it close to q yr" (09/18/2014)  . Depression   . Environmental allergies   . Erosion of suburethral sling (HCC)   . Frequency of urination   . GERD (gastroesophageal reflux disease)   . Headache    "at least several times/wk" (09/18/2014)  . Heart murmur    "when I was a baby"  . Herpes simplex type 2 infection   . HLA B27 positive   . Hypothyroidism   . IBS (irritable bowel syndrome)   . IC (interstitial cystitis)   . Migraine    "maybe once/month" (09/18/2014)  . Nocturia   . NSVD (normal spontaneous vaginal delivery)    x3  . Osteoarthritis   . Pneumonia   . RLS (restless legs syndrome)   .  Sarcoid   . Seizures (HCC)    "when I was a teenager"  . Shortness of breath dyspnea    Exertion  . SUI (stress urinary incontinence, female)     Patient Active Problem List   Diagnosis Date Noted  . Complicated migraine 03/12/2017  . Class 1 obesity due to excess calories with body mass index (BMI) of 32.0 to 32.9 in adult   . Neurological symptoms 03/11/2017  . Steroid-induced hyperglycemia 03/11/2017  . Depression 03/11/2017  . Essential hypertension 03/11/2017  . Hypothyroidism 03/11/2017  . Chronic pain 03/11/2017  . Palpitations 11/04/2016  . Near syncope 11/04/2016  . Dyspnea 11/04/2016  . HA (headache) 11/14/2014  . Sarcoid 11/12/2014  . Left-sided weakness   . Left hemiparesis (HCC) 09/18/2014  . Weakness 09/18/2014  . Frontal skull lesion 09/18/2014  . Atypical chest pain 09/18/2014  . Extrinsic asthma 04/25/2013  . Weight gain 01/12/2013  . Erosion of graft 11/16/2012    Past Surgical History:  Procedure Laterality Date  . ABDOMINAL HYSTERECTOMY  2006  . ANTERIOR CERVICAL DECOMP/DISCECTOMY FUSION  2011  . APPENDECTOMY  1985  . ARTHROPLASTY     Left thumb  . BACK SURGERY  2008   X 2 in 2010  . CESAREAN SECTION  2000  . CRANIECTOMY FOR DEPRESSED SKULL FRACTURE Left 12/05/2014  Procedure: Left frontal stereotactic craniectomy for biopsy of skull lesion;  Surgeon: Consuella Lose, MD;  Location: [facility];  Service: Neurosurgery;  Laterality: Left;  Left frontal stereotactic craniectomy for biopsy of skull lesion  . CYSTO WITH HYDRODISTENSION N/A 11/30/2012   Procedure: CYSTOSCOPY/HYDRODISTENSION;  Surgeon: Bernestine Amass, MD;  Location: [facility];  Service: Urology;  Laterality: N/A;  . KNEE ARTHROSCOPY Right   . LUMBAR DISC SURGERY  02-02-2007   RIGHT SIDE L5 -- S1  . LUMBAR FUSION  12-04-2008   L4  -- S1  . LYNX RETROPUBIC SUBURETHRAL SLING  03-02-2007  . MEDIASTINOSCOPY N/A 10/26/2014   Procedure: MEDIASTINOSCOPY;  Surgeon: Melrose Nakayama, MD;  Location: [facility];  Service: Thoracic;  Laterality: N/A;  . PELVIC LAPAROSCOPY  1990's   LYSIS ADHESIONS  . PUBOVAGINAL SLING N/A 11/30/2012   Procedure: Gaynelle Arabian;  Surgeon: Bernestine Amass, MD;  Location: [facility];  Service: Urology;  Laterality: N/A;  1 HR EXAM UNDER ANESTHESIA, EXCISION OF SUB URETHRAL MESH, CYSTO, HOD   . TONSILLECTOMY  1986  . TUBAL LIGATION  2001   hulka clip  . VIDEO BRONCHOSCOPY WITH ENDOBRONCHIAL ULTRASOUND N/A 09/21/2014   Procedure: VIDEO BRONCHOSCOPY WITH ENDOBRONCHIAL ULTRASOUND;  Surgeon: Collene Gobble, MD;  Location: [facility];  Service: Thoracic;  Laterality: N/A;    OB History    Gravida Para Term Preterm AB Living   '4 4       4   '$ SAB TAB Ectopic Multiple Live Births                   Home Medications    Prior to Admission medications   Medication Sig Start Date End Date Taking? Authorizing Provider  albuterol (PROVENTIL HFA;VENTOLIN HFA) 108 (90 BASE) MCG/ACT inhaler Inhale 2 puffs into the lungs every 6 (six) hours as needed. For shortness of breath 04/25/13  Yes Elayne Snare, MD  amLODipine (NORVASC) 5 MG tablet Take 5 mg by mouth daily.    Yes [provider]  BREO ELLIPTA 100-25 MCG/INH AEPB INHALE 1 PUFF INTO THE LUNGS DAILY. 08/20/16  Yes Chesley Mires, MD  buPROPion (WELLBUTRIN XL) 300 MG 24 hr tablet Take 300 mg by mouth at bedtime. Reported on 08/20/2015 01/15/15  Yes [provider]  diazepam (VALIUM) 2 MG tablet Take 2 mg by mouth at bedtime.   Yes [provider]  HYDROmorphone (DILAUDID) 4 MG tablet Take 4 mg by mouth 2 (two) times daily as needed for pain. 11/16/16  Yes [provider]  levothyroxine (SYNTHROID, LEVOTHROID) 137 MCG tablet Take 137 mcg by mouth daily before breakfast.    Yes [provider]  omeprazole (PRILOSEC) 40 MG capsule Take 40 mg by mouth at bedtime.   Yes [provider]  ondansetron (ZOFRAN) 4 MG tablet Take 1 tablet (4 mg  total) by mouth every 8 (eight) hours as needed for nausea or vomiting. 01/29/17  Yes Seabron Spates, CNM  PARoxetine (PAXIL) 40 MG tablet Take 40 mg by mouth at bedtime. 11/29/15  Yes [provider]  propranolol (INDERAL) 40 MG tablet Take 40 mg by mouth 2 (two) times daily. 12/01/16  Yes [provider]  rOPINIRole (REQUIP) 2 MG tablet Take 2 mg by mouth at bedtime.   Yes [provider]  topiramate (TOPAMAX) 25 MG tablet Take one tablet by mouth daily(at bedtime) X 1 week; then 1 tablet twice a day for 1 week; the  1 tablet in the morning and 2 tablets at bedtime for 2 weeks; then take 2 tablets by mouth twice a day. 03/13/17 03/13/18 Yes Barton Dubois, MD  traMADol (ULTRAM) 50 MG tablet Take 1 tablet (50 mg total) by mouth every 6 (six) hours as needed. Patient taking differently: Take 50 mg by mouth daily as needed (for pain or migraines).  01/08/15  Yes Caryl Ada K, PA-C  traZODone (DESYREL) 50 MG tablet Take 50 mg by mouth at bedtime.   Yes [provider]  Turmeric 500 MG TABS Take 500 mg by mouth at bedtime.   Yes [provider]  valACYclovir (VALTREX) 500 MG tablet Take 500 mg by mouth at bedtime.   Yes [provider]  aspirin EC 81 MG tablet Take 81 mg by mouth daily.     [provider]  aspirin-acetaminophen-caffeine (EXCEDRIN MIGRAINE) [phone] MG tablet Take 2 tablets by mouth 3 (three) times daily as needed for headache or migraine.    [provider]  nitroGLYCERIN (NITROSTAT) 0.4 MG SL tablet Place 0.4 mg under the tongue every 5 (five) minutes as needed for chest pain.  10/30/16   [provider]  predniSONE (DELTASONE) 10 MG tablet Take q day 4,4,4,3,3,3,2,2,2,1,1,1 03/18/17   Daleen Bo, MD    Family History Family History  Problem Relation Age of Onset  . Diabetes Mother   . Hypertension Mother   . Heart disease Mother   . Cancer Father        LUNG AND BRAIN  . Hypertension Father   .  Heart disease Maternal Grandmother   . Cancer Maternal Grandfather        LUNG  . Heart disease Paternal Grandmother   . Cancer Paternal Grandfather     Social History Social History  Substance Use Topics  . Smoking status: Former Smoker    Packs/day: 0.50    Years: 15.00    Types: Cigarettes    Quit date: 01/15/2014  . Smokeless tobacco: Never Used  . Alcohol use 0.0 oz/week     Comment: few glasses a week     Allergies   Lyrica [pregabalin]; Humira [adalimumab]; Bactrim [sulfamethoxazole-trimethoprim]; Percocet [oxycodone-acetaminophen]; and Vicodin [hydrocodone-acetaminophen]   Review of Systems Review of Systems  All other systems reviewed and are negative.    Physical Exam Updated Vital Signs BP 117/76 (BP Location: Right Arm)   Pulse 78   Temp 97.8 F (36.6 C) (Oral)   Resp 18   LMP 06/03/2012   SpO2 97%   Physical Exam  Constitutional: She is oriented to person, place, and time. She appears well-developed and well-nourished. No distress.  HENT:  Head: Normocephalic and atraumatic.  No angioedema of lips or tongue or oropharynx.  Uvula is in the midline.  There is no trismus.  Eyes: Pupils are equal, round, and reactive to light. Conjunctivae and EOM are normal.  Neck: Normal range of motion and phonation normal. Neck supple.  No stridor.  No tracheal deviation.  Cardiovascular: Normal rate and regular rhythm.   Pulmonary/Chest: Effort normal and breath sounds normal. She exhibits no tenderness.  Abdominal: Soft. She exhibits no distension. There is no tenderness. There is no guarding.  Musculoskeletal: Normal range of motion.  Neurological: She is alert and oriented to person, place, and time. No cranial nerve deficit. She exhibits normal muscle tone. Coordination normal.  Skin: Skin is warm and dry.  Psychiatric: Her behavior is normal. Judgment and thought content normal.  Anxious  Nursing note  and vitals reviewed.    ED Treatments / Results   Labs (all labs ordered are listed, but only abnormal results are displayed) Labs Reviewed - No data to display  EKG  EKG Interpretation None       Radiology No results found.  Procedures Procedures (including critical care time)  Medications Ordered in ED Medications  ibuprofen (ADVIL,MOTRIN) tablet 600 mg (600 mg Oral Given 03/17/17 2211)  HYDROmorphone (DILAUDID) injection 2 mg (2 mg Intramuscular Given 03/17/17 2346)  ondansetron (ZOFRAN-ODT) disintegrating tablet 8 mg (8 mg Oral Given 03/17/17 2340)  famotidine (PEPCID) tablet 20 mg (20 mg Oral Given 03/17/17 2341)  predniSONE (DELTASONE) tablet 60 mg (60 mg Oral Given 03/17/17 2340)     Initial Impression / Assessment and Plan / ED Course  I have reviewed the triage vital signs and the nursing notes.  Pertinent labs & imaging results that were available during my care of the patient were reviewed by me and considered in my medical decision making (see chart for details).      Patient Vitals for the past 24 hrs:  BP Temp Temp src Pulse Resp SpO2  03/18/17 0033 117/76 97.8 F (36.6 C) Oral 78 18 97 %  03/17/17 1955 122/81 98.4 F (36.9 C) Oral 77 (!) 21 94 %    At D/C- Reevaluation with update and discussion. After initial assessment and treatment, an updated evaluation reveals she is feeling better, speaking with a clear voice and states she can swallow easily.  Findings discussed with patient and husband, all questions were answered. , L    Final Clinical Impressions(s) / ED Diagnoses   Final diagnoses:  Allergic reaction, initial encounter    Apparent intolerance, allergy of Lyrica.  Improved with antihistamine and steroid treatment.  Doubt anaphylaxis, metabolic instability or impending vascular collapse.  Nursing Notes Reviewed/ Care Coordinated Applicable Imaging Reviewed Interpretation of Laboratory Data incorporated into ED treatment  The patient appears reasonably screened and/or  stabilized for discharge and I doubt any other medical condition or other [facility] requiring further screening, evaluation, or treatment in the ED at this time prior to discharge.  Plan: Home Medications-Benadryl and Pepcid for 5 days.  Stop Lyrica, continue other medications; Home Treatments-rest; return here if the recommended treatment, does not improve the symptoms; Recommended follow up-PCP, as needed   New Prescriptions Discharge Medication List as of 03/18/2017 12:18 AM       Daleen Bo, MD 03/18/17 1721

## 2017-03-17 NOTE — ED Notes (Signed)
Bed: EH20 Expected date:  Expected time:  Means of arrival:  Comments: 51 yr old allergic reaction

## 2017-03-18 ENCOUNTER — Encounter (HOSPITAL_COMMUNITY): Payer: Self-pay | Admitting: *Deleted

## 2017-03-18 LAB — IGG CSF INDEX
ALBUMIN CSF-MCNC: 9 mg/dL — AB (ref 11–48)
IGG CSF: 1 mg/dL (ref 0.0–8.6)
IGG INDEX CSF: UNDETERMINED
IGG/ALB RATIO, CSF: 0.11 (ref 0.00–0.25)

## 2017-03-18 MED ORDER — PREDNISONE 10 MG PO TABS: ORAL_TABLET | ORAL | 0 refills | Status: DC

## 2017-03-18 NOTE — Progress Notes (Signed)
Anesthesia Chart Review:  Pt is a same day work up.   Pt is a 51 year old female scheduled for excision L wrist volar mass on 03/19/2017 with Charlotte Crumb, MD  - PCP is Maurice Small, MD - Cardiologist is Jenkins Rouge, MD. Last office visit 12/15/16; f/u in 1 year recommended.    PMH includes:  Sarcoid, heart murmur (as a baby), asthma, hypothyroidism, seizures (as a teenager), GERD. S/p L frontal stereotactic craniectomy, skull lesion biopsy 12/05/14. S/p video bronchoscopy 09/21/14 and mediastinoscopy 10/26/14. S/p pubo-vaginal sling 11/30/12.   - ED visit 03/17/17 for allergic reaction. Pt started lyrica and felt her tongue swelled up. Tx self with claritin. ED documented no angioedema, tx with prednisone taper over 12 days.   - Hospitalized 7/11-14/18 for weakness, difficulty walking, headaches, numbness/tingling L face, trouble swallowing, expressive aphasia, slurring speech.  Stroke work up negative.  Dx with complicated migraines. Hospitalization complicated by steroid-induced hyperglycemia.  - Hospitalized in [place] for chest pain. Cath and echo results below.  Felt to be non-cardiac.    Medications include: albuterol, amlodipine, ASA 81mg , breo ellipta, levothyroxine, Prilosec, prednisone (on chronic 5mg  daily dose; started on 12 day increased dose taper in ED 03/18/17), propranolol, Topamax  Labs from hospitalization 03/13/17 reviewed. BMET acceptable for surgery. WBC count high at 24.0. Will recheck CBC DOS.  CXR 12/01/16: No acute disease.  EKG 03/10/17: Sinus rhythm. RSR' in V1 or V2, probably normal variant  Cardiac cath 10/16/16:  1. LM: Large vessel without disease. 2. LAD: Large vessel with 30% mid stenosis. 3. LCx: Large nondominant vessel without disease. 4. RCA: Large dominant vessel with minimal disease 10-20% in midsegment. 5. Normal LV systolic function. Estimated EF 60-65%.  Echo 10/16/16: 1. Mild concentric LVH. 2. Overall LV systolic function normal EF  55-60%.  If labs acceptable DOS, I anticipate pt can proceed as scheduled.   Willeen Cass, FNP-BC [facility]/Anesthesiology Phone: [phone] 03/18/2017 10:13 AM

## 2017-03-18 NOTE — ED Notes (Signed)
Tolerating po fluids well.   

## 2017-03-18 NOTE — Progress Notes (Signed)
Pt denies cardiac history. Had Echo and Cath done in February, 2018 for chest pain. Both were good. Pt recently in hospital for intermittent left side weakness and intermittent confusion. Was told it due to complicated migraines. Pt started Lyrica yesterday and had swelling of her tongue shortly after and went to the ED. Pt states she's feeling better this AM.

## 2017-03-18 NOTE — Discharge Instructions (Signed)
Stop taking the Lyrica.  For the next 5 days, take Pepcid 20 mg twice a day, and Benadryl 25 mg 4 times a day

## 2017-03-19 ENCOUNTER — Encounter (HOSPITAL_COMMUNITY): Admission: EM | Disposition: A | Discharge status: Home / Self Care | Payer: Self-pay | Attending: Orthopedic Surgery

## 2017-03-19 ENCOUNTER — Ambulatory Visit (HOSPITAL_COMMUNITY)
Admission: EM | Admit: 2017-03-19 | Discharge: 2017-03-19 | Disposition: A | Discharge status: Home / Self Care | Payer: BLUE CROSS/BLUE SHIELD | Attending: Orthopedic Surgery | Admitting: Orthopedic Surgery

## 2017-03-19 ENCOUNTER — Encounter (HOSPITAL_COMMUNITY): Payer: Self-pay

## 2017-03-19 ENCOUNTER — Ambulatory Visit (HOSPITAL_COMMUNITY): Payer: BLUE CROSS/BLUE SHIELD | Admitting: Emergency Medicine

## 2017-03-19 DIAGNOSIS — D869 Sarcoidosis, unspecified: Secondary | ICD-10-CM | POA: Diagnosis not present

## 2017-03-19 DIAGNOSIS — J449 Chronic obstructive pulmonary disease, unspecified: Secondary | ICD-10-CM | POA: Diagnosis not present

## 2017-03-19 DIAGNOSIS — F419 Anxiety disorder, unspecified: Secondary | ICD-10-CM | POA: Diagnosis not present

## 2017-03-19 DIAGNOSIS — Z809 Family history of malignant neoplasm, unspecified: Secondary | ICD-10-CM | POA: Insufficient documentation

## 2017-03-19 DIAGNOSIS — Z833 Family history of diabetes mellitus: Secondary | ICD-10-CM | POA: Insufficient documentation

## 2017-03-19 DIAGNOSIS — Z808 Family history of malignant neoplasm of other organs or systems: Secondary | ICD-10-CM | POA: Insufficient documentation

## 2017-03-19 DIAGNOSIS — Z981 Arthrodesis status: Secondary | ICD-10-CM | POA: Insufficient documentation

## 2017-03-19 DIAGNOSIS — Z6832 Body mass index (BMI) 32.0-32.9, adult: Secondary | ICD-10-CM | POA: Insufficient documentation

## 2017-03-19 DIAGNOSIS — M19041 Primary osteoarthritis, right hand: Secondary | ICD-10-CM | POA: Insufficient documentation

## 2017-03-19 DIAGNOSIS — E039 Hypothyroidism, unspecified: Secondary | ICD-10-CM | POA: Diagnosis not present

## 2017-03-19 DIAGNOSIS — F329 Major depressive disorder, single episode, unspecified: Secondary | ICD-10-CM | POA: Insufficient documentation

## 2017-03-19 DIAGNOSIS — M4690 Unspecified inflammatory spondylopathy, site unspecified: Secondary | ICD-10-CM | POA: Diagnosis not present

## 2017-03-19 DIAGNOSIS — G8929 Other chronic pain: Secondary | ICD-10-CM | POA: Insufficient documentation

## 2017-03-19 DIAGNOSIS — M549 Dorsalgia, unspecified: Secondary | ICD-10-CM | POA: Insufficient documentation

## 2017-03-19 DIAGNOSIS — G43909 Migraine, unspecified, not intractable, without status migrainosus: Secondary | ICD-10-CM | POA: Diagnosis not present

## 2017-03-19 DIAGNOSIS — M4692 Unspecified inflammatory spondylopathy, cervical region: Secondary | ICD-10-CM | POA: Diagnosis not present

## 2017-03-19 DIAGNOSIS — J45909 Unspecified asthma, uncomplicated: Secondary | ICD-10-CM | POA: Diagnosis not present

## 2017-03-19 DIAGNOSIS — K219 Gastro-esophageal reflux disease without esophagitis: Secondary | ICD-10-CM | POA: Insufficient documentation

## 2017-03-19 DIAGNOSIS — Z801 Family history of malignant neoplasm of trachea, bronchus and lung: Secondary | ICD-10-CM | POA: Insufficient documentation

## 2017-03-19 DIAGNOSIS — G2581 Restless legs syndrome: Secondary | ICD-10-CM | POA: Insufficient documentation

## 2017-03-19 DIAGNOSIS — K589 Irritable bowel syndrome without diarrhea: Secondary | ICD-10-CM | POA: Diagnosis not present

## 2017-03-19 DIAGNOSIS — M67432 Ganglion, left wrist: Secondary | ICD-10-CM | POA: Diagnosis not present

## 2017-03-19 DIAGNOSIS — Z888 Allergy status to other drugs, medicaments and biological substances status: Secondary | ICD-10-CM | POA: Insufficient documentation

## 2017-03-19 DIAGNOSIS — Z90710 Acquired absence of both cervix and uterus: Secondary | ICD-10-CM | POA: Insufficient documentation

## 2017-03-19 DIAGNOSIS — R351 Nocturia: Secondary | ICD-10-CM | POA: Insufficient documentation

## 2017-03-19 DIAGNOSIS — Z882 Allergy status to sulfonamides status: Secondary | ICD-10-CM | POA: Insufficient documentation

## 2017-03-19 DIAGNOSIS — R2232 Localized swelling, mass and lump, left upper limb: Secondary | ICD-10-CM | POA: Diagnosis present

## 2017-03-19 DIAGNOSIS — Z87891 Personal history of nicotine dependence: Secondary | ICD-10-CM | POA: Insufficient documentation

## 2017-03-19 DIAGNOSIS — Z885 Allergy status to narcotic agent status: Secondary | ICD-10-CM | POA: Insufficient documentation

## 2017-03-19 DIAGNOSIS — D3610 Benign neoplasm of peripheral nerves and autonomic nervous system, unspecified: Secondary | ICD-10-CM | POA: Diagnosis not present

## 2017-03-19 DIAGNOSIS — Z8249 Family history of ischemic heart disease and other diseases of the circulatory system: Secondary | ICD-10-CM | POA: Diagnosis not present

## 2017-03-19 DIAGNOSIS — D2112 Benign neoplasm of connective and other soft tissue of left upper limb, including shoulder: Secondary | ICD-10-CM | POA: Diagnosis not present

## 2017-03-19 DIAGNOSIS — R0600 Dyspnea, unspecified: Secondary | ICD-10-CM | POA: Diagnosis not present

## 2017-03-19 DIAGNOSIS — Z79899 Other long term (current) drug therapy: Secondary | ICD-10-CM | POA: Insufficient documentation

## 2017-03-19 DIAGNOSIS — M19042 Primary osteoarthritis, left hand: Secondary | ICD-10-CM | POA: Diagnosis not present

## 2017-03-19 DIAGNOSIS — D3612 Benign neoplasm of peripheral nerves and autonomic nervous system, upper limb, including shoulder: Secondary | ICD-10-CM | POA: Diagnosis not present

## 2017-03-19 DIAGNOSIS — I10 Essential (primary) hypertension: Secondary | ICD-10-CM | POA: Diagnosis not present

## 2017-03-19 HISTORY — PX: MASS EXCISION: SHX2000

## 2017-03-19 HISTORY — DX: Pneumonia, unspecified organism: J18.9

## 2017-03-19 LAB — CBC
HCT: 38 % (ref 36.0–46.0)
Hemoglobin: 12.5 g/dL (ref 12.0–15.0)
MCH: 29.9 pg (ref 26.0–34.0)
MCHC: 32.9 g/dL (ref 30.0–36.0)
MCV: 90.9 fL (ref 78.0–100.0)
PLATELETS: 207 10*3/uL (ref 150–400)
RBC: 4.18 MIL/uL (ref 3.87–5.11)
RDW: 15.2 % (ref 11.5–15.5)
WBC: 15.5 10*3/uL — ABNORMAL HIGH (ref 4.0–10.5)

## 2017-03-19 LAB — ANGIOTENSIN CONVERTING ENZYME, CSF: Angio Convert Enzyme: 0.7 U/L (ref 0.0–2.8)

## 2017-03-19 SURGERY — EXCISION MASS
Anesthesia: General | Laterality: Left

## 2017-03-19 MED ORDER — BUPIVACAINE HCL (PF) 0.25 % IJ SOLN
INTRAMUSCULAR | Status: DC | PRN
  Administered 2017-03-19: 7 mL

## 2017-03-19 MED ORDER — CEFAZOLIN SODIUM-DEXTROSE 2-4 GM/100ML-% IV SOLN
INTRAVENOUS | Status: AC
  Filled 2017-03-19: qty 100

## 2017-03-19 MED ORDER — MIDAZOLAM HCL 2 MG/2ML IJ SOLN
INTRAMUSCULAR | Status: DC | PRN
Start: 2017-03-19 — End: 2017-03-19
  Administered 2017-03-19: 2 mg via INTRAVENOUS

## 2017-03-19 MED ORDER — HYDROMORPHONE HCL 1 MG/ML IJ SOLN
INTRAMUSCULAR | Status: AC
  Administered 2017-03-19: 0.5 mg via INTRAVENOUS
  Filled 2017-03-19: qty 0.5

## 2017-03-19 MED ORDER — MEPERIDINE HCL 25 MG/ML IJ SOLN: 6.2500 mg | INTRAMUSCULAR | Status: DC | PRN

## 2017-03-19 MED ORDER — HYDROMORPHONE HCL 1 MG/ML IJ SOLN
0.2500 mg | INTRAMUSCULAR | Status: DC | PRN
  Administered 2017-03-19 (×2): 0.5 mg via INTRAVENOUS

## 2017-03-19 MED ORDER — PROPOFOL 10 MG/ML IV BOLUS
INTRAVENOUS | Status: DC | PRN
  Administered 2017-03-19: 200 mg via INTRAVENOUS
  Administered 2017-03-19: 100 mg via INTRAVENOUS

## 2017-03-19 MED ORDER — LIDOCAINE HCL (PF) 1 % IJ SOLN
INTRAMUSCULAR | Status: AC
  Filled 2017-03-19: qty 30

## 2017-03-19 MED ORDER — PROPOFOL 10 MG/ML IV BOLUS
INTRAVENOUS | Status: AC
  Filled 2017-03-19: qty 20

## 2017-03-19 MED ORDER — FENTANYL CITRATE (PF) 250 MCG/5ML IJ SOLN
INTRAMUSCULAR | Status: AC
  Filled 2017-03-19: qty 5

## 2017-03-19 MED ORDER — DEXAMETHASONE SODIUM PHOSPHATE 10 MG/ML IJ SOLN
INTRAMUSCULAR | Status: DC | PRN
  Administered 2017-03-19: 10 mg via INTRAVENOUS

## 2017-03-19 MED ORDER — PROMETHAZINE HCL 25 MG/ML IJ SOLN: 6.2500 mg | INTRAMUSCULAR | Status: DC | PRN

## 2017-03-19 MED ORDER — MIDAZOLAM HCL 2 MG/2ML IJ SOLN: 0.5000 mg | Freq: Once | INTRAMUSCULAR | Status: DC | PRN

## 2017-03-19 MED ORDER — MIDAZOLAM HCL 2 MG/2ML IJ SOLN
INTRAMUSCULAR | Status: AC
  Filled 2017-03-19: qty 2

## 2017-03-19 MED ORDER — BUPIVACAINE-EPINEPHRINE (PF) 0.25% -1:200000 IJ SOLN
INTRAMUSCULAR | Status: AC
  Filled 2017-03-19: qty 30

## 2017-03-19 MED ORDER — CHLORHEXIDINE GLUCONATE 4 % EX LIQD: 60.0000 mL | Freq: Once | CUTANEOUS | Status: DC

## 2017-03-19 MED ORDER — FENTANYL CITRATE (PF) 250 MCG/5ML IJ SOLN
INTRAMUSCULAR | Status: DC | PRN
  Administered 2017-03-19: 50 ug via INTRAVENOUS

## 2017-03-19 MED ORDER — ONDANSETRON HCL 4 MG/2ML IJ SOLN
INTRAMUSCULAR | Status: DC | PRN
  Administered 2017-03-19: 4 mg via INTRAVENOUS

## 2017-03-19 MED ORDER — EPHEDRINE SULFATE-NACL 50-0.9 MG/10ML-% IV SOSY
PREFILLED_SYRINGE | INTRAVENOUS | Status: DC | PRN
  Administered 2017-03-19 (×2): 10 mg via INTRAVENOUS

## 2017-03-19 MED ORDER — LIDOCAINE 2% (20 MG/ML) 5 ML SYRINGE
INTRAMUSCULAR | Status: DC | PRN
  Administered 2017-03-19: 20 mg via INTRAVENOUS

## 2017-03-19 MED ORDER — CEFAZOLIN SODIUM-DEXTROSE 2-4 GM/100ML-% IV SOLN
2.0000 g | INTRAVENOUS | Status: AC
  Administered 2017-03-19: 2 g via INTRAVENOUS

## 2017-03-19 MED ORDER — 0.9 % SODIUM CHLORIDE (POUR BTL) OPTIME
TOPICAL | Status: DC | PRN
  Administered 2017-03-19: 1000 mL

## 2017-03-19 MED ORDER — PROPRANOLOL HCL 40 MG PO TABS
40.0000 mg | ORAL_TABLET | Freq: Once | ORAL | Status: AC
  Administered 2017-03-19: 40 mg via ORAL
  Filled 2017-03-19 (×2): qty 1

## 2017-03-19 MED ORDER — LACTATED RINGERS IV SOLN
INTRAVENOUS | Status: DC | PRN
  Administered 2017-03-19: 07:00:00 via INTRAVENOUS

## 2017-03-19 MED ORDER — HYDROMORPHONE HCL 1 MG/ML IJ SOLN
INTRAMUSCULAR | Status: AC
  Filled 2017-03-19: qty 0.5

## 2017-03-19 SURGICAL SUPPLY — 50 items
APL SKNCLS STERI-STRIP NONHPOA (GAUZE/BANDAGES/DRESSINGS) ×1
BANDAGE ACE 3X5.8 VEL STRL LF (GAUZE/BANDAGES/DRESSINGS) IMPLANT
BANDAGE ACE 4X5 VEL STRL LF (GAUZE/BANDAGES/DRESSINGS) ×2 IMPLANT
BENZOIN TINCTURE PRP APPL 2/3 (GAUZE/BANDAGES/DRESSINGS) ×2 IMPLANT
BLADE SURG 15 STRL LF DISP TIS (BLADE) ×1 IMPLANT
BLADE SURG 15 STRL SS (BLADE) ×2
BNDG CMPR 9X4 STRL LF SNTH (GAUZE/BANDAGES/DRESSINGS)
BNDG COHESIVE 1X5 TAN STRL LF (GAUZE/BANDAGES/DRESSINGS) IMPLANT
BNDG ESMARK 4X9 LF (GAUZE/BANDAGES/DRESSINGS) IMPLANT
BNDG GAUZE ELAST 4 BULKY (GAUZE/BANDAGES/DRESSINGS) ×2 IMPLANT
CLSR STERI-STRIP ANTIMIC 1/2X4 (GAUZE/BANDAGES/DRESSINGS) ×2 IMPLANT
CONT SPEC 4OZ CLIKSEAL STRL BL (MISCELLANEOUS) ×2 IMPLANT
CORDS BIPOLAR (ELECTRODE) ×2 IMPLANT
COVER BACK TABLE 60X90IN (DRAPES) ×2 IMPLANT
COVER SURGICAL LIGHT HANDLE (MISCELLANEOUS) ×2 IMPLANT
DECANTER SPIKE VIAL GLASS SM (MISCELLANEOUS) ×2 IMPLANT
DRAPE HALF SHEET 40X57 (DRAPES) ×2 IMPLANT
DRAPE SURG 17X23 STRL (DRAPES) ×2 IMPLANT
DURAPREP 26ML APPLICATOR (WOUND CARE) ×2 IMPLANT
GAUZE SPONGE 4X4 12PLY STRL (GAUZE/BANDAGES/DRESSINGS) ×2 IMPLANT
GAUZE SPONGE 4X4 12PLY STRL LF (GAUZE/BANDAGES/DRESSINGS) ×2 IMPLANT
GAUZE SPONGE 4X4 16PLY XRAY LF (GAUZE/BANDAGES/DRESSINGS) ×2 IMPLANT
GAUZE XEROFORM 1X8 LF (GAUZE/BANDAGES/DRESSINGS) IMPLANT
GLOVE SURG SYN 8.0 (GLOVE) ×2 IMPLANT
GOWN STRL REUS W/ TWL LRG LVL3 (GOWN DISPOSABLE) ×1 IMPLANT
GOWN STRL REUS W/ TWL XL LVL3 (GOWN DISPOSABLE) ×1 IMPLANT
GOWN STRL REUS W/TWL LRG LVL3 (GOWN DISPOSABLE) ×2
GOWN STRL REUS W/TWL XL LVL3 (GOWN DISPOSABLE) ×2
KIT BASIN OR (CUSTOM PROCEDURE TRAY) ×2 IMPLANT
KIT ROOM TURNOVER OR (KITS) ×2 IMPLANT
NEEDLE HYPO 25GX1X1/2 BEV (NEEDLE) ×2 IMPLANT
NEEDLE HYPO 25X1 1.5 SAFETY (NEEDLE) IMPLANT
NS IRRIG 1000ML POUR BTL (IV SOLUTION) ×2 IMPLANT
PAD ARMBOARD 7.5X6 YLW CONV (MISCELLANEOUS) ×4 IMPLANT
PAD CAST 3X4 CTTN HI CHSV (CAST SUPPLIES) ×1 IMPLANT
PADDING CAST COTTON 3X4 STRL (CAST SUPPLIES) ×2
SPEAR EYE SURG WECK-CEL (MISCELLANEOUS) IMPLANT
STOCKINETTE 4X48 STRL (DRAPES) ×2 IMPLANT
STOCKINETTE IMPERVIOUS 9X36 MD (GAUZE/BANDAGES/DRESSINGS) ×2 IMPLANT
STRIP CLOSURE SKIN 1/2X4 (GAUZE/BANDAGES/DRESSINGS) IMPLANT
SUT ETHILON 4 0 PS 2 18 (SUTURE) IMPLANT
SUT PROLENE 3 0 PS 2 (SUTURE) IMPLANT
SUT VICRYL RAPIDE 4/0 PS 2 (SUTURE) IMPLANT
SYR 10ML LL (SYRINGE) IMPLANT
SYR BULB 3OZ (MISCELLANEOUS) ×2 IMPLANT
SYR CONTROL 10ML LL (SYRINGE) ×2 IMPLANT
TOWEL OR 17X24 6PK STRL BLUE (TOWEL DISPOSABLE) ×2 IMPLANT
TUBE CONNECTING 12X1/4 (SUCTIONS) ×2 IMPLANT
UNDERPAD 30X30 (UNDERPADS AND DIAPERS) ×2 IMPLANT
WATER STERILE IRR 1000ML POUR (IV SOLUTION) ×2 IMPLANT

## 2017-03-19 NOTE — Anesthesia Procedure Notes (Signed)
Procedure Name: LMA Insertion Date/Time: 03/19/2017 7:34 AM Performed by: Julieta Bellini Pre-anesthesia Checklist: Patient identified, Emergency Drugs available, Patient being monitored and Suction available Patient Re-evaluated:Patient Re-evaluated prior to induction Oxygen Delivery Method: Circle system utilized Preoxygenation: Pre-oxygenation with 100% oxygen Induction Type: IV induction Ventilation: Mask ventilation without difficulty LMA: LMA inserted LMA Size: 4.0 Number of attempts: 1 Placement Confirmation: positive ETCO2 and breath sounds checked- equal and bilateral Tube secured with: Tape Dental Injury: Teeth and Oropharynx as per pre-operative assessment

## 2017-03-19 NOTE — H&P (Signed)
[name] is an 51 y.o. female.   Chief Complaint: Left wrist volar mass HPI: Patient is a very pleasant 51 year old female with a symptomatic left wrist volar mass times several months with worsening symptoms recently  Past Medical History:  Diagnosis Date  . Ankylosing spondylitis ([place])   . Anxiety   . Arthritis    "back, hands, neck" (09/18/2014)  . Asthma   . Chronic back pain    "whole spine"  . Chronic bronchitis ([place])    "get it close to q yr" (09/18/2014)  . Depression   . Environmental allergies   . Erosion of suburethral sling ([place])   . Frequency of urination   . GERD (gastroesophageal reflux disease)   . Headache    "at least several times/wk" (09/18/2014)  . Heart murmur    "when I was a baby"  . Herpes simplex type 2 infection   . HLA B27 positive   . Hypothyroidism   . IBS (irritable bowel syndrome)   . IC (interstitial cystitis)   . Migraine    "maybe once/month" (09/18/2014)  . Nocturia   . NSVD (normal spontaneous vaginal delivery)    x3  . Osteoarthritis   . Pneumonia   . RLS (restless legs syndrome)   . Sarcoid   . Seizures ([place])    "when I was a teenager"  . Shortness of breath dyspnea    Exertion  . SUI (stress urinary incontinence, female)     Past Surgical History:  Procedure Laterality Date  . ABDOMINAL HYSTERECTOMY  2006  . ANTERIOR CERVICAL DECOMP/DISCECTOMY FUSION  2011  . APPENDECTOMY  1985  . ARTHROPLASTY     Left thumb  . BACK SURGERY  2008   X 2 in 2010  . CESAREAN SECTION  2000  . CRANIECTOMY FOR DEPRESSED SKULL FRACTURE Left 12/05/2014   Procedure: Left frontal stereotactic craniectomy for biopsy of skull lesion;  Surgeon: Consuella Lose, MD;  Location: [place] [facility];  Service: Neurosurgery;  Laterality: Left;  Left frontal stereotactic craniectomy for biopsy of skull lesion  . CYSTO WITH HYDRODISTENSION N/A 11/30/2012   Procedure: CYSTOSCOPY/HYDRODISTENSION;  Surgeon: Bernestine Amass, MD;  [facility];  Service: Urology;  Laterality: N/A;  . KNEE ARTHROSCOPY Right   . LUMBAR DISC SURGERY  02-02-2007   RIGHT SIDE L5 -- S1  . LUMBAR FUSION  12-04-2008   L4  -- S1  . LYNX RETROPUBIC SUBURETHRAL SLING  03-02-2007  . MEDIASTINOSCOPY N/A 10/26/2014   Procedure: MEDIASTINOSCOPY;  Surgeon: Melrose Nakayama, MD;  Location: [place];  Service: Thoracic;  Laterality: N/A;  . PELVIC LAPAROSCOPY  1990's   LYSIS ADHESIONS  . PUBOVAGINAL SLING N/A 11/30/2012   Procedure: Gaynelle Arabian;  Surgeon: Bernestine Amass, MD;  Location: [facility];  Service: Urology;  Laterality: N/A;  1 HR EXAM UNDER ANESTHESIA, EXCISION OF SUB URETHRAL MESH, CYSTO, HOD   . TONSILLECTOMY  1986  . TUBAL LIGATION  2001   hulka clip  . VIDEO BRONCHOSCOPY WITH ENDOBRONCHIAL ULTRASOUND N/A 09/21/2014   Procedure: VIDEO BRONCHOSCOPY WITH ENDOBRONCHIAL ULTRASOUND;  Surgeon: Collene Gobble, MD;  Location: [facility];  Service: Thoracic;  Laterality: N/A;    Family History  Problem Relation Age of Onset  . Diabetes Mother   . Hypertension Mother   . Heart disease Mother   . Cancer Father        LUNG AND BRAIN  . Hypertension Father   . Heart disease  Maternal Grandmother   . Cancer Maternal Grandfather        LUNG  . Heart disease Paternal Grandmother   . Cancer Paternal Grandfather    Social History:  reports that she quit smoking about 3 years ago. Her smoking use included Cigarettes. She has a 7.50 pack-year smoking history. She has never used smokeless tobacco. She reports that she drinks alcohol. She reports that she uses drugs.  Allergies:  Allergies  Allergen Reactions  . Lyrica [Pregabalin] Anaphylaxis  . Humira [Adalimumab] Other (See Comments)    Muscle weakness  . Bactrim [Sulfamethoxazole-Trimethoprim] Other (See Comments)    Fever   . Percocet [Oxycodone-Acetaminophen] Itching  . Vicodin [Hydrocodone-Acetaminophen] Itching    Medications Prior to Admission  Medication Sig Dispense  Refill  . albuterol (PROVENTIL HFA;VENTOLIN HFA) 108 (90 BASE) MCG/ACT inhaler Inhale 2 puffs into the lungs every 6 (six) hours as needed. For shortness of breath 3.7 g 1  . amLODipine (NORVASC) 5 MG tablet Take 5 mg by mouth daily.     Marland Kitchen aspirin EC 81 MG tablet Take 81 mg by mouth daily.     Marland Kitchen aspirin-acetaminophen-caffeine (EXCEDRIN MIGRAINE) 250-250-65 MG tablet Take 2 tablets by mouth 3 (three) times daily as needed for headache or migraine.    Marland Kitchen BREO ELLIPTA 100-25 MCG/INH AEPB INHALE 1 PUFF INTO THE LUNGS DAILY. 60 each 5  . buPROPion (WELLBUTRIN XL) 300 MG 24 hr tablet Take 300 mg by mouth at bedtime. Reported on 08/20/2015  11  . diazepam (VALIUM) 2 MG tablet Take 2 mg by mouth at bedtime.    Marland Kitchen HYDROmorphone (DILAUDID) 4 MG tablet Take 4 mg by mouth 2 (two) times daily as needed for pain.    Marland Kitchen levothyroxine (SYNTHROID, LEVOTHROID) 137 MCG tablet Take 137 mcg by mouth daily before breakfast.     . omeprazole (PRILOSEC) 40 MG capsule Take 40 mg by mouth at bedtime.    . ondansetron (ZOFRAN) 4 MG tablet Take 1 tablet (4 mg total) by mouth every 8 (eight) hours as needed for nausea or vomiting. 20 tablet 0  . PARoxetine (PAXIL) 40 MG tablet Take 40 mg by mouth at bedtime.  3  . predniSONE (DELTASONE) 10 MG tablet Take q day 4,4,4,3,3,3,2,2,2,1,1,1 30 tablet 0  . propranolol (INDERAL) 40 MG tablet Take 40 mg by mouth 2 (two) times daily.    Marland Kitchen rOPINIRole (REQUIP) 2 MG tablet Take 2 mg by mouth at bedtime.    . topiramate (TOPAMAX) 25 MG tablet Take one tablet by mouth daily(at bedtime) X 1 week; then 1 tablet twice a day for 1 week; the 1 tablet in the morning and 2 tablets at bedtime for 2 weeks; then take 2 tablets by mouth twice a day. 60 tablet 2  . traZODone (DESYREL) 50 MG tablet Take 50 mg by mouth at bedtime.    . Turmeric 500 MG TABS Take 500 mg by mouth at bedtime.    . valACYclovir (VALTREX) 500 MG tablet Take 500 mg by mouth at bedtime.    . nitroGLYCERIN (NITROSTAT) 0.4 MG SL  tablet Place 0.4 mg under the tongue every 5 (five) minutes as needed for chest pain.     . traMADol (ULTRAM) 50 MG tablet Take 1 tablet (50 mg total) by mouth every 6 (six) hours as needed. (Patient taking differently: Take 50 mg by mouth daily as needed (for pain or migraines). ) 15 tablet 0    No results found for this or any previous  visit (from the past 48 hour(s)). No results found.  Review of Systems  All other systems reviewed and are negative.   Blood pressure 130/85, pulse 69, temperature 98.4 F (36.9 C), temperature source Oral, resp. rate 20, weight 91.9 kg (202 lb 9.6 oz), last menstrual period 06/03/2012, SpO2 95 %. Physical Exam  Constitutional: She is oriented to person, place, and time. She appears well-developed and well-nourished.  HENT:  Head: Normocephalic and atraumatic.  Neck: Normal range of motion.  Cardiovascular: Normal rate.   Musculoskeletal:       Left wrist: She exhibits tenderness and swelling.  Left wrist volar mass between FCR and radial artery 11 cm  Neurological: She is alert and oriented to person, place, and time.  Skin: Skin is warm.  Psychiatric: She has a normal mood and affect. Her behavior is normal. Judgment and thought content normal.     Assessment/Plan As above. Plan on excisional biopsy left wrist volar mass  Schuyler Amor, MD 03/19/2017, 6:53 AM

## 2017-03-19 NOTE — Anesthesia Preprocedure Evaluation (Addendum)
Anesthesia Evaluation  Patient identified by MRN, date of birth, ID band Patient awake    Reviewed: Allergy & Precautions, NPO status , Patient's Chart, lab work & pertinent test results  History of Anesthesia Complications Negative for: history of anesthetic complications  Airway Mallampati: II  TM Distance: >3 FB Neck ROM: Full    Dental  (+) Dental Advisory Given, Teeth Intact   Pulmonary COPD,  COPD inhaler, former smoker,  sarcoid   breath sounds clear to auscultation       Cardiovascular hypertension, Pt. on home beta blockers and Pt. on medications (-) angina Rhythm:Regular Rate:Normal  2/18 ECHO: EF 50-55%, valves OK 2/18 cath: mild non-obstructive ASCADz   Neuro/Psych  Headaches, Anxiety Depression    GI/Hepatic Neg liver ROS, GERD  Medicated and Controlled,  Endo/Other  Hypothyroidism Morbid obesity  Renal/GU negative Renal ROS     Musculoskeletal  (+) Arthritis ,   Abdominal (+) + obese,   Peds  Hematology   Anesthesia Other Findings   Reproductive/Obstetrics                           Anesthesia Physical Anesthesia Plan  ASA: III  Anesthesia Plan: General   Post-op Pain Management:    Induction: Intravenous  PONV Risk Score and Plan: 3 and Ondansetron, Dexamethasone and Midazolam  Airway Management Planned: LMA  Additional Equipment:   Intra-op Plan:   Post-operative Plan:   Informed Consent: I have reviewed the patients History and Physical, chart, labs and discussed the procedure including the risks, benefits and alternatives for the proposed anesthesia with the patient or authorized representative who has indicated his/her understanding and acceptance.   Dental advisory given  Plan Discussed with: CRNA, Surgeon and Anesthesiologist  Anesthesia Plan Comments:        Anesthesia Quick Evaluation

## 2017-03-19 NOTE — Anesthesia Postprocedure Evaluation (Signed)
Anesthesia Post Note  Patient: [name]  Procedure(s) Performed: Procedure(s) (LRB): EXCISION LEFT WRIST VOLAR MASS (Left)     Patient location during evaluation: PACU Anesthesia Type: General Level of consciousness: awake and alert, patient cooperative and oriented Pain management: pain level controlled Vital Signs Assessment: post-procedure vital signs reviewed and stable Respiratory status: spontaneous breathing, nonlabored ventilation and respiratory function stable Cardiovascular status: blood pressure returned to baseline and stable Postop Assessment: no signs of nausea or vomiting Anesthetic complications: no    Last Vitals:  Vitals:   03/19/17 1006 03/19/17 1008  BP: (!) 156/99 (!) 143/92  Pulse: 68 72  Resp:    Temp:      Last Pain:  Vitals:   03/19/17 0850  TempSrc:   PainSc: 5                  ,E. 

## 2017-03-19 NOTE — Op Note (Signed)
See note [phone]

## 2017-03-19 NOTE — Op Note (Signed)
[name], LIBERATI              ACCOUNT NO.:  [id number]  MEDICAL RECORD NO.:  [id number]  LOCATION:  [facility]                         FACILITY:  [facility]  PHYSICIAN:  Sheral Apley. , M.D.DATE OF BIRTH:  1966-01-27  DATE OF PROCEDURE:  03/19/2017 DATE OF DISCHARGE:                              OPERATIVE REPORT   PREOPERATIVE DIAGNOSIS:  Symptomatic left wrist volar mass.  POSTOPERATIVE DIAGNOSIS:  Symptomatic left wrist volar mass.  PROCEDURE:  Excisional biopsy of deep mass, left wrist.  SURGEON:  Sheral Apley. Burney Gauze, M.D.  ASSISTANT:  None.  ANESTHESIA:  General.  COMPLICATION:  None.  DRAINS:  None.  SPECIMEN:  One specimen sent.  DESCRIPTION OF PROCEDURE:  The patient was taken to the operating suite after induction of adequate general anesthetic.  Left upper extremity was prepped and draped in sterile fashion.  An Esmarch was used to exsanguinate the limb.  Tourniquet was inflated to 250 mmHg.  At this point in time, a 3 cm incision was made over the interval between the flexor carpi radialis tendon and radial artery.  Skin was incised sharply.  The FCR was identified and retracted to the midline.  There was a lipomatous lesion seen coming between the interval between the radial artery and the FCR tendon, which was excised.  It was approximately 1 x 1.5 cm.  There was also a fair amount of hypertrophic synovium along the FCR tendon, which was debrided as well.  This was all sent for pathologic confirmation.  The wound was thoroughly irrigated and then loosely closed with a 3-0 Monocryl subcuticular stitch.  Steri- Strips, 4x4s, fluffs, and a volar splint was applied.  The patient tolerated the procedure well, went to recovery room in stable fashion.     Sheral Apley Burney Gauze, M.D.    MAW/MEDQ  D:  03/19/2017  T:  03/19/2017  Job:  [id number]

## 2017-03-19 NOTE — Transfer of Care (Signed)
Immediate Anesthesia Transfer of Care Note  Patient: CAY KATH  Procedure(s) Performed: Procedure(s): EXCISION LEFT WRIST VOLAR MASS (Left)  Patient Location: PACU  Anesthesia Type:General  Level of Consciousness: awake, alert , oriented and patient cooperative  Airway & Oxygen Therapy: Patient Spontanous Breathing and Patient connected to nasal cannula oxygen  Post-op Assessment: Report given to RN, Post -op Vital signs reviewed and stable and Patient moving all extremities X 4  Post vital signs: Reviewed and stable  Last Vitals:  Vitals:   03/19/17 0638 03/19/17 0820  BP: 130/85   Pulse: 69 (P) 78  Resp: 20 (P) 14  Temp: 36.9 C (P) 36.5 C    Last Pain:  Vitals:   03/19/17 0272  TempSrc: Oral         Complications: No apparent anesthesia complications

## 2017-03-20 ENCOUNTER — Encounter (HOSPITAL_COMMUNITY): Payer: Self-pay | Admitting: Orthopedic Surgery

## 2017-03-23 DIAGNOSIS — M79642 Pain in left hand: Secondary | ICD-10-CM | POA: Diagnosis not present

## 2017-03-23 DIAGNOSIS — M79641 Pain in right hand: Secondary | ICD-10-CM | POA: Diagnosis not present

## 2017-03-24 DIAGNOSIS — M0609 Rheumatoid arthritis without rheumatoid factor, multiple sites: Secondary | ICD-10-CM | POA: Diagnosis not present

## 2017-04-01 DIAGNOSIS — G43909 Migraine, unspecified, not intractable, without status migrainosus: Secondary | ICD-10-CM | POA: Diagnosis not present

## 2017-04-05 DIAGNOSIS — H524 Presbyopia: Secondary | ICD-10-CM | POA: Diagnosis not present

## 2017-04-05 DIAGNOSIS — G43511 Persistent migraine aura without cerebral infarction, intractable, with status migrainosus: Secondary | ICD-10-CM | POA: Diagnosis not present

## 2017-04-05 DIAGNOSIS — H4389 Other disorders of vitreous body: Secondary | ICD-10-CM | POA: Diagnosis not present

## 2017-04-06 ENCOUNTER — Encounter (HOSPITAL_COMMUNITY): Payer: Self-pay | Admitting: Emergency Medicine

## 2017-04-06 ENCOUNTER — Ambulatory Visit (HOSPITAL_COMMUNITY)
Admission: EM | Admit: 2017-04-06 | Discharge: 2017-04-06 | Disposition: A | Discharge status: Home / Self Care | Payer: BLUE CROSS/BLUE SHIELD | Attending: Family Medicine | Admitting: Family Medicine

## 2017-04-06 DIAGNOSIS — R110 Nausea: Secondary | ICD-10-CM | POA: Diagnosis not present

## 2017-04-06 DIAGNOSIS — IMO0002 Reserved for concepts with insufficient information to code with codable children: Secondary | ICD-10-CM

## 2017-04-06 DIAGNOSIS — G43709 Chronic migraine without aura, not intractable, without status migrainosus: Secondary | ICD-10-CM

## 2017-04-06 MED ORDER — ONDANSETRON 4 MG PO TBDP
8.0000 mg | ORAL_TABLET | Freq: Once | ORAL | Status: AC
  Administered 2017-04-06: 8 mg via ORAL

## 2017-04-06 MED ORDER — KETOROLAC TROMETHAMINE 30 MG/ML IJ SOLN
INTRAMUSCULAR | Status: AC
  Filled 2017-04-06: qty 1

## 2017-04-06 MED ORDER — ONDANSETRON 4 MG PO TBDP
ORAL_TABLET | ORAL | Status: AC
  Filled 2017-04-06: qty 2

## 2017-04-06 MED ORDER — KETOROLAC TROMETHAMINE 30 MG/ML IJ SOLN
30.0000 mg | Freq: Once | INTRAMUSCULAR | Status: AC
  Administered 2017-04-06: 30 mg via INTRAMUSCULAR

## 2017-04-06 NOTE — ED Provider Notes (Signed)
  [place]   [id number] 04/06/17 Arrival Time: 1500  ASSESSMENT & PLAN:  1. Chronic migraine     Meds ordered this encounter  Medications  . ketorolac (TORADOL) 30 MG/ML injection 30 mg  . ondansetron (ZOFRAN-ODT) disintegrating tablet 8 mg    Reviewed expectations re: course of current medical issues. Questions answered. Outlined signs and symptoms indicating need for more acute intervention. Patient verbalized understanding. After Visit Summary given.   SUBJECTIVE:  [name] is a 51 y.o. female who presents with complaint of Migraine. She is been evaluated in the emergency room for this, and by her primary care provider for this. She received an injection of Toradol 5 days ago, and one 2 days ago, with some relief. She has had nausea and vomiting, light sensitivity and sound sensitivity, headache is not worsened with defecation, no change in headache with food. She has an appointment with her neurologist on Thursday with primary care tomorrow. Remainder of history of present illness is negative  ROS: As per HPI, remainder of ROS negative.   OBJECTIVE:  Vitals:   04/06/17 1518  BP: (!) 94/57  Pulse: 74  Resp: 16  Temp: 98 F (36.7 C)  TempSrc: Oral  SpO2: 98%     General appearance: alert; no distress HEENT: normocephalic; atraumatic; conjunctivae normal; TMs normal Neck: No JVD Lungs: clear to auscultation bilaterally Heart: regular rate and rhythm Abdomen: soft, non-tender; bowel sounds normal; no masses or organomegaly; no guarding or rebound tenderness Back: no CVA tenderness Extremities: no cyanosis or edema; symmetrical with no gross deformities Skin: warm and dry Neurologic: Grossly normal, cranial nerves II through XII grossly intact Psychological:  alert and cooperative; normal mood and affect  Procedures:    Labs Reviewed - No data to display  No results found.  Allergies  Allergen Reactions  . Lyrica [Pregabalin]  Anaphylaxis  . Humira [Adalimumab] Other (See Comments)    Muscle weakness  . Bactrim [Sulfamethoxazole-Trimethoprim] Other (See Comments)    Fever   . Percocet [Oxycodone-Acetaminophen] Itching  . Vicodin [Hydrocodone-Acetaminophen] Itching    PMHx, SurgHx, SocialHx, Medications, and Allergies were reviewed in the Visit Navigator and updated as appropriate.       Barnet Glasgow, NP 04/06/17 1547

## 2017-04-06 NOTE — Discharge Instructions (Signed)
For your migraine, was given injection of Toradol and Zofran for nausea. Follow up with your neurologist for further evaluation and management of your condition. If symptoms worsen, you develop unilateral weakness, consider the emergency room

## 2017-04-06 NOTE — ED Triage Notes (Signed)
The patient presented to the [facility] with a complaint of a chronic migraine. The patient reported multiple visits and interventions this week.

## 2017-04-07 DIAGNOSIS — G44201 Tension-type headache, unspecified, intractable: Secondary | ICD-10-CM | POA: Diagnosis not present

## 2017-04-07 DIAGNOSIS — M62830 Muscle spasm of back: Secondary | ICD-10-CM | POA: Diagnosis not present

## 2017-04-08 DIAGNOSIS — G43709 Chronic migraine without aura, not intractable, without status migrainosus: Secondary | ICD-10-CM | POA: Diagnosis not present

## 2017-04-08 DIAGNOSIS — G4452 New daily persistent headache (NDPH): Secondary | ICD-10-CM | POA: Diagnosis not present

## 2017-04-08 DIAGNOSIS — D869 Sarcoidosis, unspecified: Secondary | ICD-10-CM | POA: Diagnosis not present

## 2017-04-08 DIAGNOSIS — M961 Postlaminectomy syndrome, not elsewhere classified: Secondary | ICD-10-CM | POA: Diagnosis not present

## 2017-04-20 DIAGNOSIS — M0609 Rheumatoid arthritis without rheumatoid factor, multiple sites: Secondary | ICD-10-CM | POA: Diagnosis not present

## 2017-04-24 DIAGNOSIS — Z87891 Personal history of nicotine dependence: Secondary | ICD-10-CM | POA: Diagnosis not present

## 2017-04-24 DIAGNOSIS — R109 Unspecified abdominal pain: Secondary | ICD-10-CM | POA: Diagnosis not present

## 2017-04-24 DIAGNOSIS — M549 Dorsalgia, unspecified: Secondary | ICD-10-CM | POA: Diagnosis not present

## 2017-04-24 DIAGNOSIS — R39198 Other difficulties with micturition: Secondary | ICD-10-CM | POA: Diagnosis not present

## 2017-04-24 DIAGNOSIS — R509 Fever, unspecified: Secondary | ICD-10-CM | POA: Diagnosis not present

## 2017-04-24 DIAGNOSIS — R110 Nausea: Secondary | ICD-10-CM | POA: Diagnosis not present

## 2017-04-24 DIAGNOSIS — R1012 Left upper quadrant pain: Secondary | ICD-10-CM | POA: Diagnosis not present

## 2017-04-29 DIAGNOSIS — G4452 New daily persistent headache (NDPH): Secondary | ICD-10-CM | POA: Diagnosis not present

## 2017-04-29 DIAGNOSIS — M47816 Spondylosis without myelopathy or radiculopathy, lumbar region: Secondary | ICD-10-CM | POA: Diagnosis not present

## 2017-04-29 DIAGNOSIS — G43709 Chronic migraine without aura, not intractable, without status migrainosus: Secondary | ICD-10-CM | POA: Diagnosis not present

## 2017-05-03 DIAGNOSIS — M542 Cervicalgia: Secondary | ICD-10-CM | POA: Diagnosis not present

## 2017-05-07 DIAGNOSIS — Z23 Encounter for immunization: Secondary | ICD-10-CM | POA: Diagnosis not present

## 2017-05-07 DIAGNOSIS — M459 Ankylosing spondylitis of unspecified sites in spine: Secondary | ICD-10-CM | POA: Diagnosis not present

## 2017-05-07 DIAGNOSIS — R51 Headache: Secondary | ICD-10-CM | POA: Diagnosis not present

## 2017-05-07 DIAGNOSIS — D869 Sarcoidosis, unspecified: Secondary | ICD-10-CM | POA: Diagnosis not present

## 2017-05-21 ENCOUNTER — Telehealth: Payer: Self-pay | Admitting: Pulmonary Disease

## 2017-05-21 NOTE — Telephone Encounter (Signed)
Pt reports of increased sob, dizziness & weakness x1w Pt states she fainted on wednesday. Pt states O2 have between 89-93% on room air. Pt currently on '20mg'$  prednisone daily.   MW please advise, as VS has left for the afternoon. Thanks.    12/11/14 AVS Assessment/Plan:  Chest pain with exertion. - her symptoms are suggestive of angina pectoris and she reports symptomatic improvement with SL NTG - she has f/u appt with cardiology on 12/15/16 - some of her symptoms could also be related to asthma >> advised her to try using albuterol  Systemic sarcoidosis with polyarthritis and HLA B27 positive - Folow up with rheumatology rheumatology (Dr. Lenna Gilford)  Asthma - could be part of sarcoidosis involvement of airways - continue Breo and prn albuterol   Patient Instructions  Follow up in 6 months

## 2017-05-21 NOTE — Telephone Encounter (Signed)
Pt is aware of MW's recommendations and voiced her understanding. Nothing further needed.

## 2017-05-21 NOTE — Telephone Encounter (Signed)
Not able to treat this over the phone > should go to ER

## 2017-05-25 DIAGNOSIS — M47816 Spondylosis without myelopathy or radiculopathy, lumbar region: Secondary | ICD-10-CM | POA: Diagnosis not present

## 2017-05-26 DIAGNOSIS — Z79899 Other long term (current) drug therapy: Secondary | ICD-10-CM | POA: Diagnosis not present

## 2017-05-26 DIAGNOSIS — M0609 Rheumatoid arthritis without rheumatoid factor, multiple sites: Secondary | ICD-10-CM | POA: Diagnosis not present

## 2017-05-31 DIAGNOSIS — Z1589 Genetic susceptibility to other disease: Secondary | ICD-10-CM | POA: Diagnosis not present

## 2017-05-31 DIAGNOSIS — M150 Primary generalized (osteo)arthritis: Secondary | ICD-10-CM | POA: Diagnosis not present

## 2017-05-31 DIAGNOSIS — M064 Inflammatory polyarthropathy: Secondary | ICD-10-CM | POA: Diagnosis not present

## 2017-05-31 DIAGNOSIS — D8689 Sarcoidosis of other sites: Secondary | ICD-10-CM | POA: Diagnosis not present

## 2017-06-02 DIAGNOSIS — G894 Chronic pain syndrome: Secondary | ICD-10-CM | POA: Diagnosis not present

## 2017-06-02 DIAGNOSIS — E039 Hypothyroidism, unspecified: Secondary | ICD-10-CM | POA: Diagnosis not present

## 2017-06-09 ENCOUNTER — Ambulatory Visit (INDEPENDENT_AMBULATORY_CARE_PROVIDER_SITE_OTHER): Payer: BLUE CROSS/BLUE SHIELD | Admitting: Pulmonary Disease

## 2017-06-09 ENCOUNTER — Ambulatory Visit (INDEPENDENT_AMBULATORY_CARE_PROVIDER_SITE_OTHER)
Admission: RE | Admit: 2017-06-09 | Discharge: 2017-06-09 | Disposition: A | Discharge status: Home / Self Care | Payer: BLUE CROSS/BLUE SHIELD | Source: Ambulatory Visit | Attending: Pulmonary Disease | Admitting: Pulmonary Disease

## 2017-06-09 ENCOUNTER — Encounter: Payer: Self-pay | Admitting: Pulmonary Disease

## 2017-06-09 VITALS — BP 122/76 | HR 84 | Ht 66.0 in | Wt 178.8 lb

## 2017-06-09 DIAGNOSIS — R0609 Other forms of dyspnea: Secondary | ICD-10-CM

## 2017-06-09 DIAGNOSIS — J4530 Mild persistent asthma, uncomplicated: Secondary | ICD-10-CM | POA: Diagnosis not present

## 2017-06-09 DIAGNOSIS — D869 Sarcoidosis, unspecified: Secondary | ICD-10-CM

## 2017-06-09 DIAGNOSIS — R0602 Shortness of breath: Secondary | ICD-10-CM | POA: Diagnosis not present

## 2017-06-09 NOTE — Patient Instructions (Signed)
Chest xray today  Will schedule pulmonary function test and 6 minute walk test  Follow up in 2 weeks with Dr. Halford Chessman or Nurse Practitioner

## 2017-06-09 NOTE — Progress Notes (Signed)
Current Outpatient Prescriptions on File Prior to Visit  Medication Sig  . albuterol (PROVENTIL HFA;VENTOLIN HFA) 108 (90 BASE) MCG/ACT inhaler Inhale 2 puffs into the lungs every 6 (six) hours as needed. For shortness of breath  . amLODipine (NORVASC) 5 MG tablet Take 5 mg by mouth daily.   Marland Kitchen aspirin EC 81 MG tablet Take 81 mg by mouth daily.   Marland Kitchen aspirin-acetaminophen-caffeine (EXCEDRIN MIGRAINE) 250-250-65 MG tablet Take 2 tablets by mouth 3 (three) times daily as needed for headache or migraine.  Marland Kitchen BREO ELLIPTA 100-25 MCG/INH AEPB INHALE 1 PUFF INTO THE LUNGS DAILY.  Marland Kitchen buPROPion (WELLBUTRIN XL) 300 MG 24 hr tablet Take 300 mg by mouth at bedtime. Reported on 08/20/2015  . diazepam (VALIUM) 2 MG tablet Take 2 mg by mouth at bedtime.  Marland Kitchen HYDROmorphone (DILAUDID) 4 MG tablet Take 4 mg by mouth 2 (two) times daily as needed for pain.  Marland Kitchen levothyroxine (SYNTHROID, LEVOTHROID) 137 MCG tablet Take 137 mcg by mouth daily before breakfast.   . nitroGLYCERIN (NITROSTAT) 0.4 MG SL tablet Place 0.4 mg under the tongue every 5 (five) minutes as needed for chest pain.   Marland Kitchen omeprazole (PRILOSEC) 40 MG capsule Take 40 mg by mouth at bedtime.  . ondansetron (ZOFRAN) 4 MG tablet Take 1 tablet (4 mg total) by mouth every 8 (eight) hours as needed for nausea or vomiting.  Marland Kitchen PARoxetine (PAXIL) 40 MG tablet Take 40 mg by mouth at bedtime.  . predniSONE (DELTASONE) 10 MG tablet Take q day 4,4,4,3,3,3,2,2,2,1,1,1  . propranolol (INDERAL) 40 MG tablet Take 40 mg by mouth 2 (two) times daily.  Marland Kitchen rOPINIRole (REQUIP) 2 MG tablet Take 2 mg by mouth at bedtime.  . traMADol (ULTRAM) 50 MG tablet Take 1 tablet (50 mg total) by mouth every 6 (six) hours as needed. (Patient taking differently: Take 50 mg by mouth daily as needed (for pain or migraines). )  . traZODone (DESYREL) 50 MG tablet Take 50 mg by mouth at bedtime.  . Turmeric 500 MG TABS Take 500 mg by mouth at bedtime.  . valACYclovir (VALTREX) 500 MG tablet Take 500 mg  by mouth at bedtime.   No current facility-administered medications on file prior to visit.     Chief Complaint  Patient presents with  . Follow-up    Pt has had migraine since end June 2018; tried several options nothing working. Pt having SOB with exertion, pt has checked O2 and pulse throughout the day and with exertion drops to 88%. Pt walked 10 ft SOB, gasps for a breath with dizziness and chest pain.     Events 09/20/14 Quantiferon gold >> negative 10/26/14 Mediastinoscopy >> granulomas  Pulmonary function tests 02/05/15 >> FEV1 2.96 (94%), FEV1% 80, TLC 5.27 (95%), DLCO 80%, +BD  Imaging 09/18/14 CT head >> 12 x 6 mm Lt frontal lytic lesion 09/18/14 MRI brain >> 12 mm Lt frontal lytic lesion 09/18/14 Bone scan >> no other lesions 09/19/14 CT chest >> Rt paratracheal LAN 1.5 cm, Rt subcarinal LAN 2 cm, Rt hilar LAN 1.4 cm, Lt hilar LAN 1.7 cm 09/19/14 CT abd/pelvis >> 1.6 cm LAN at gastrohepatic ligament, 1.3 cm LAN at portal vein, 0.9 cm LAN Rt renal artery 04/30/15 CT chest >> decreased mediastinal and hilar LAN, stable 2 mm nodule RML 11/04/16 CT chest >> mild LAN, coronary calcification 11/17/16 Cardiac MR >> EF 69%, normal RV fx, negative for sarcoidosis  Cardiac tests Echo 10/16/16 >> mild LVH, EF 55 to 60% LHC  10/16/16 >> non obstructive CAD  Serology 09/20/14 ACE 76, ANA negative 01/01/15 ACE 26 09/03/15 ACE 60 10/01/15 ACE 19  Past medical history GERD, IBS, RLS, Depression, Anxiety, Hypothyroidism, Interstitial cystitis, Chronic back pain, HLA B27 positive  Past surgical history, Family history, Social history, Allergies reviewed  Vital signs Blood pressure 122/76, pulse 84, height '5\' 6"'$  (1.676 m), weight 178 lb 12.8 oz (81.1 kg), last menstrual period 06/03/2012, SpO2 97 %.  History of Present Illness: [name] is a 51 y.o. female with systemic sarcoidosis and asthma.  She has been getting more short of breath.  She has a portable oxygen meter.  Her oxygen  level can go down into the high 80's.  She gets dizzy if she exerts too much.  She doesn't feel like she can get air into her lungs at time.  She uses her inhalers, but still has trouble.  She is not having cough, wheeze, or sputum.  She has persistent headache.  She isn't having skin rash or leg swelling.  Physical Exam:  General - pleasant Eyes - pupils reactive ENT - no sinus tenderness, no oral exudate, no LAN Cardiac - regular, no murmur Chest - no wheeze, rales Abd - soft, non tender Ext - no edema Skin - no rashes Neuro - normal strength Psych - normal mood    BMP Latest Ref Rng & Units 03/13/2017 03/10/2017 03/10/2017  Glucose 65 - 99 mg/dL 218(H) 159(H) 169(H)  BUN 6 - 20 mg/dL '19 16 16  '$ Creatinine 0.44 - 1.00 mg/dL 0.87 1.10(H) 1.13(H)  Sodium 135 - 145 mmol/L 136 140 140  Potassium 3.5 - 5.1 mmol/L 4.1 4.7 4.7  Chloride 101 - 111 mmol/L 102 104 107  CO2 22 - 32 mmol/L 25 - 26  Calcium 8.9 - 10.3 mg/dL 9.0 - 9.1    CBC Latest Ref Rng & Units 03/19/2017 03/13/2017 03/10/2017  WBC 4.0 - 10.5 K/uL 15.5(H) 24.0(H) -  Hemoglobin 12.0 - 15.0 g/dL 12.5 13.3 13.6  Hematocrit 36.0 - 46.0 % 38.0 38.4 40.0  Platelets 150 - 400 K/uL 207 241 -     Assessment/Plan:  Dyspnea on exertion with mild intermittent hypoxia. - it is not clear what is causing this - not certain that this is related to sarcoidosis or asthma - will arrange for chest xray, PFT, and 6MWT to further assess  Systemic sarcoidosis with polyarthritis and HLA B27 positive - followed by Dr. Trudie Reed with rheumatology  Asthma - continue breo and prn albuterol   Patient Instructions  Chest xray today  Will schedule pulmonary function test and 6 minute walk test  Follow up in 2 weeks with Dr. Halford Chessman or Nurse Practitioner   Chesley Mires, MD [place] Pager:  [phone] 06/09/2017, 12:36 PM

## 2017-06-10 ENCOUNTER — Telehealth: Payer: Self-pay | Admitting: Pulmonary Disease

## 2017-06-10 NOTE — Telephone Encounter (Signed)
Dg Chest 2 View  Result Date: 06/09/2017 CLINICAL DATA:  Increasing shortness of breath on exertion for 2 months. History of hypertension and sarcoidosis. Ex-smoker. EXAM: CHEST  2 VIEW COMPARISON:  Radiographs 12/01/2016.  Abdominal CT 04/24/2017. FINDINGS: The heart size and mediastinal contours are normal. The lungs are clear. There is no pleural effusion or pneumothorax. No acute osseous findings are identified. Lower cervical fusion noted. IMPRESSION: Stable chest.  No active cardiopulmonary process. Electronically Signed   By: Richardean Sale M.D.   On: 06/09/2017 16:39     Please let her know that her CXR did not show evidence for progression of sarcoidosis, and nothing else to explain her symptoms of shortness of breath.  Will call back after she has done PFT and 6MWT.

## 2017-06-10 NOTE — Telephone Encounter (Signed)
Left voice mail on machine for patient to return phone call back regarding results and recommendations.  Will follow up again with patient at later date. 

## 2017-06-14 NOTE — Telephone Encounter (Signed)
Advised pt of results. Pt understood and nothing further is needed.   

## 2017-06-14 NOTE — Telephone Encounter (Signed)
Pt returned phone call, pt contact # [phone]

## 2017-06-16 DIAGNOSIS — M961 Postlaminectomy syndrome, not elsewhere classified: Secondary | ICD-10-CM | POA: Diagnosis not present

## 2017-06-16 DIAGNOSIS — M47816 Spondylosis without myelopathy or radiculopathy, lumbar region: Secondary | ICD-10-CM | POA: Diagnosis not present

## 2017-06-16 DIAGNOSIS — M455 Ankylosing spondylitis of thoracolumbar region: Secondary | ICD-10-CM | POA: Diagnosis not present

## 2017-06-24 ENCOUNTER — Encounter: Payer: Self-pay | Admitting: Pulmonary Disease

## 2017-06-29 DIAGNOSIS — M6740 Ganglion, unspecified site: Secondary | ICD-10-CM | POA: Diagnosis not present

## 2017-06-29 DIAGNOSIS — M79641 Pain in right hand: Secondary | ICD-10-CM | POA: Diagnosis not present

## 2017-06-29 DIAGNOSIS — M1811 Unilateral primary osteoarthritis of first carpometacarpal joint, right hand: Secondary | ICD-10-CM | POA: Diagnosis not present

## 2017-06-29 DIAGNOSIS — M79642 Pain in left hand: Secondary | ICD-10-CM | POA: Diagnosis not present

## 2017-07-01 DIAGNOSIS — M47816 Spondylosis without myelopathy or radiculopathy, lumbar region: Secondary | ICD-10-CM | POA: Diagnosis not present

## 2017-07-02 ENCOUNTER — Telehealth: Payer: Self-pay

## 2017-07-02 NOTE — Telephone Encounter (Signed)
   Chart reviewed as part of pre-operative protocol coverage. Given past medical history of non obstructive CAD in Feb 2018, lack of convincing angina, and time since last visit, based on ACC/AHA guidelines, [name] would be at acceptable risk for the planned procedure without further cardiovascular testing.   Kerin Ransom, PA-C 07/02/2017, 4:21 PM

## 2017-07-02 NOTE — Telephone Encounter (Signed)
Faxed to Cyndee Brightly, RN at [facility] as requested.

## 2017-07-02 NOTE — Telephone Encounter (Signed)
   [facility] Pre-operative Risk Assessment    Request for surgical clearance:  1. What type of surgery is being performed? Right thumb cmc arthroplasty w/ apl transfer, right wrist stenosing tenosynovitis release   2. When is this surgery scheduled? Jul 16, 2017   3. Are there any medications that need to be held prior to surgery and how long?N/A   4. Practice name and name of physician performing surgery? [facility]/ Dr. Charlotte Crumb   5. What is your office phone and fax number? Ph:[phone]  Fx [phone] Attn Cyndee Brightly, RN   6. Anesthesia type (None, local, MAC, general) ? General and Axillary Block   Tod Persia 07/02/2017, 8:09 AM  _________________________________________________________________   (provider comments below)

## 2017-07-08 ENCOUNTER — Ambulatory Visit: Payer: BLUE CROSS/BLUE SHIELD | Admitting: Pulmonary Disease

## 2017-07-08 ENCOUNTER — Ambulatory Visit: Payer: BLUE CROSS/BLUE SHIELD

## 2017-07-08 DIAGNOSIS — M5416 Radiculopathy, lumbar region: Secondary | ICD-10-CM | POA: Diagnosis not present

## 2017-07-16 DIAGNOSIS — G8918 Other acute postprocedural pain: Secondary | ICD-10-CM | POA: Diagnosis not present

## 2017-07-16 DIAGNOSIS — M1811 Unilateral primary osteoarthritis of first carpometacarpal joint, right hand: Secondary | ICD-10-CM | POA: Diagnosis not present

## 2017-07-16 DIAGNOSIS — M189 Osteoarthritis of first carpometacarpal joint, unspecified: Secondary | ICD-10-CM | POA: Diagnosis not present

## 2017-07-16 DIAGNOSIS — M180 Bilateral primary osteoarthritis of first carpometacarpal joints: Secondary | ICD-10-CM | POA: Diagnosis not present

## 2017-07-16 DIAGNOSIS — M654 Radial styloid tenosynovitis [de Quervain]: Secondary | ICD-10-CM | POA: Diagnosis not present

## 2017-07-20 DIAGNOSIS — M6740 Ganglion, unspecified site: Secondary | ICD-10-CM | POA: Diagnosis not present

## 2017-07-20 DIAGNOSIS — M1811 Unilateral primary osteoarthritis of first carpometacarpal joint, right hand: Secondary | ICD-10-CM | POA: Diagnosis not present

## 2017-07-20 DIAGNOSIS — M79644 Pain in right finger(s): Secondary | ICD-10-CM | POA: Diagnosis not present

## 2017-07-20 DIAGNOSIS — M79641 Pain in right hand: Secondary | ICD-10-CM | POA: Diagnosis not present

## 2017-07-25 IMAGING — MR MR CARD MORPHOLOGY WO/W CM
10 of 12 series · 14 of 16 positions shown · IV contrast (25    Multihance)
Comparison: none

CLINICAL DATA: Sarcoidosis, assess for cardiac involvement.

EXAM:
CARDIAC MRI
TECHNIQUE: The patient was scanned on a 1.5 Tesla GE magnet. A dedicated
cardiac coil was used. Functional imaging was done using Fiesta
sequences. [DATE], and 4 chamber views were done to assess for RWMA's.
Modified Alphamikechoromeo rule using a short axis stack was used to
calculate an ejection fraction on a dedicated work station using
Circle software. The patient received cc of Multihance. After 10
minutes inversion recovery sequences were used to assess for
infiltration and scar tissue.

[Series 3: bSSFP · sagittal · 8.0mm · 1.41mm/px · 1 of 17 slices shown (1 of 5)]
[im 1/17]
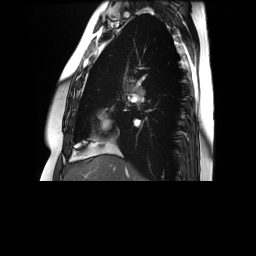

[Series 5: bSSFP · axial · 8.0mm · 1.25mm/px · 1 of 20 slices shown (2 of 5)]
[im 1/20]
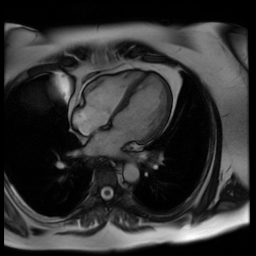

[Series 6: bSSFP · oblique · 8.0mm · 1.41mm/px · 5 of 360 slices shown (3 of 5)]
[im 1/360]
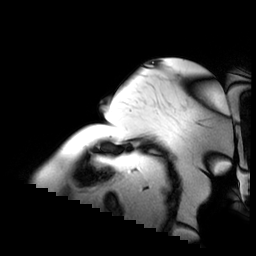
[im 90/360]
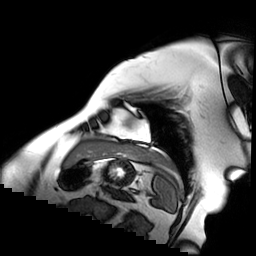
[im 180/360]
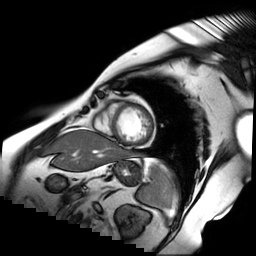
[im 270/360]
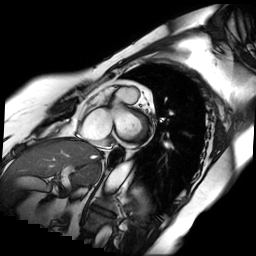
[im 360/360]
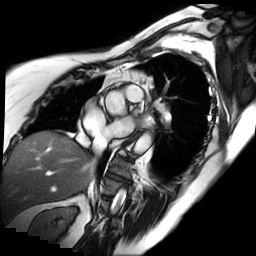

[Series 7: bSSFP · axial · 8.0mm · 1.41mm/px · 1 of 60 slices shown (4 of 5)]
[im 1/60]
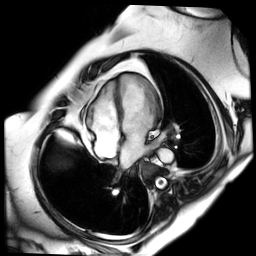

[Series 8: bSSFP · oblique · 8.0mm · 1.41mm/px · 1 of 20 slices shown (5 of 5)]
[im 1/20]
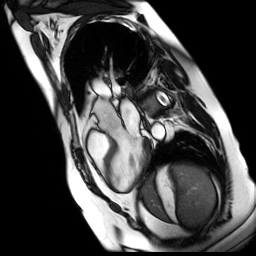

[Series 9: T2 fat-sat · oblique · 8.0mm · 1.41mm/px · 1 of 15 slices shown]
[im 1/15]
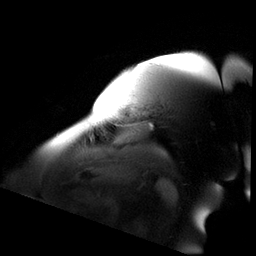

[Series 12: cine ir · oblique · 8.0mm · 1.37mm/px · 1 of 30 slices shown]
[im 1/30]
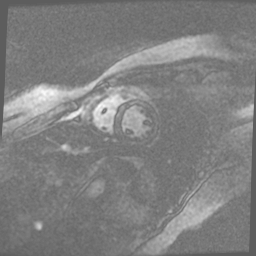

[Series 14: delayed ir prep · oblique · 8.0mm · 1.41mm/px · 1 of 6 slices shown (1 of 2)]
[im 1/6]
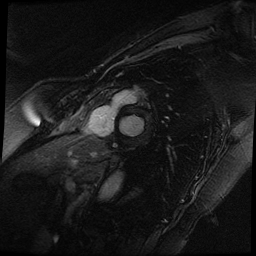

[Series 15: delayed ir prep · oblique · 8.0mm · 1.41mm/px · 1 of 13 slices shown (2 of 2)]
[im 1/13]
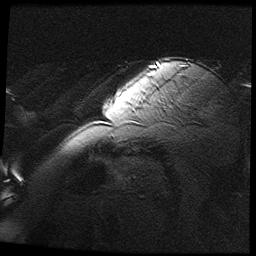

[Series 17: rad delayed ir · axial · 8.0mm · 1.41mm/px · 1 of 3 slices shown]
[im 1/3]
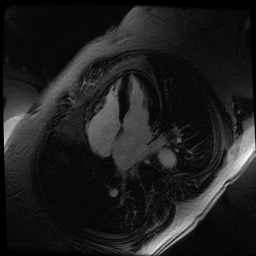

[14 of 16 positions shown; findings below may reference images not displayed]

CONTRAST:  Limited images show clear lung fields. There does appear
to be hilar lymphadenopathy.

Normal left ventricular size and wall thickness. EF 69% with normal
wall motion. Normal right ventricular size and systolic function, EF
48%. Mildly dilated left atrium. Normal right atrial size.
Trileaflet aortic valve with no significant regurgitation. No
significant mitral regurgitation noted.

On delayed enhancement imaging, there was no right or left
ventricular late gadolinium enhancement (LGE).

MEASUREMENTS:
MEASUREMENTS
LVEDV 121 mL

LV SV 83 mL

LV EF 69%

RVEDV 121 mL

RV SV 57 mL

RV EF 48%
FINDINGS: 1.  Normal left ventricular size and systolic function, EF 69%.

2.  Normal right ventricular size and systolic function, EF 48%.

3. No myocardial LGE, so no definite evidence for prior myocardial
infarction, infiltrative disease, or myocarditis.

4.  Overall, no evidence for cardiac involvement by sarcoidosis.
IMPRESSION: German Patricio Farel

## 2017-07-26 ENCOUNTER — Ambulatory Visit: Payer: BLUE CROSS/BLUE SHIELD | Admitting: Pulmonary Disease

## 2017-07-29 DIAGNOSIS — M961 Postlaminectomy syndrome, not elsewhere classified: Secondary | ICD-10-CM | POA: Diagnosis not present

## 2017-07-29 DIAGNOSIS — G43709 Chronic migraine without aura, not intractable, without status migrainosus: Secondary | ICD-10-CM | POA: Diagnosis not present

## 2017-07-29 DIAGNOSIS — M455 Ankylosing spondylitis of thoracolumbar region: Secondary | ICD-10-CM | POA: Diagnosis not present

## 2017-07-29 DIAGNOSIS — M47816 Spondylosis without myelopathy or radiculopathy, lumbar region: Secondary | ICD-10-CM | POA: Diagnosis not present

## 2017-08-05 DIAGNOSIS — R197 Diarrhea, unspecified: Secondary | ICD-10-CM | POA: Diagnosis not present

## 2017-08-05 DIAGNOSIS — R002 Palpitations: Secondary | ICD-10-CM | POA: Diagnosis not present

## 2017-08-17 DIAGNOSIS — M0609 Rheumatoid arthritis without rheumatoid factor, multiple sites: Secondary | ICD-10-CM | POA: Diagnosis not present

## 2017-08-27 DIAGNOSIS — G43709 Chronic migraine without aura, not intractable, without status migrainosus: Secondary | ICD-10-CM | POA: Diagnosis not present

## 2017-09-22 DIAGNOSIS — E039 Hypothyroidism, unspecified: Secondary | ICD-10-CM | POA: Diagnosis not present

## 2017-09-24 ENCOUNTER — Other Ambulatory Visit: Payer: Self-pay | Admitting: Family Medicine

## 2017-09-24 DIAGNOSIS — R221 Localized swelling, mass and lump, neck: Secondary | ICD-10-CM

## 2017-09-25 DIAGNOSIS — R221 Localized swelling, mass and lump, neck: Secondary | ICD-10-CM | POA: Diagnosis not present

## 2017-09-25 DIAGNOSIS — G43909 Migraine, unspecified, not intractable, without status migrainosus: Secondary | ICD-10-CM | POA: Diagnosis not present

## 2017-09-25 DIAGNOSIS — R51 Headache: Secondary | ICD-10-CM | POA: Diagnosis not present

## 2017-09-30 ENCOUNTER — Other Ambulatory Visit: Payer: BLUE CROSS/BLUE SHIELD

## 2017-10-06 DIAGNOSIS — J0100 Acute maxillary sinusitis, unspecified: Secondary | ICD-10-CM | POA: Diagnosis not present

## 2017-10-07 DIAGNOSIS — M961 Postlaminectomy syndrome, not elsewhere classified: Secondary | ICD-10-CM | POA: Diagnosis not present

## 2017-10-19 DIAGNOSIS — M961 Postlaminectomy syndrome, not elsewhere classified: Secondary | ICD-10-CM | POA: Diagnosis not present

## 2017-10-26 DIAGNOSIS — M455 Ankylosing spondylitis of thoracolumbar region: Secondary | ICD-10-CM | POA: Diagnosis not present

## 2017-10-26 DIAGNOSIS — M961 Postlaminectomy syndrome, not elsewhere classified: Secondary | ICD-10-CM | POA: Diagnosis not present

## 2017-10-26 DIAGNOSIS — M47816 Spondylosis without myelopathy or radiculopathy, lumbar region: Secondary | ICD-10-CM | POA: Diagnosis not present

## 2017-10-26 DIAGNOSIS — T85192D Other mechanical complication of implanted electronic neurostimulator (electrode) of spinal cord, subsequent encounter: Secondary | ICD-10-CM | POA: Diagnosis not present

## 2017-11-19 DIAGNOSIS — M792 Neuralgia and neuritis, unspecified: Secondary | ICD-10-CM | POA: Diagnosis not present

## 2017-11-19 DIAGNOSIS — M5416 Radiculopathy, lumbar region: Secondary | ICD-10-CM | POA: Diagnosis not present

## 2017-11-19 DIAGNOSIS — G894 Chronic pain syndrome: Secondary | ICD-10-CM | POA: Diagnosis not present

## 2017-11-29 DIAGNOSIS — M5416 Radiculopathy, lumbar region: Secondary | ICD-10-CM | POA: Diagnosis not present

## 2017-12-01 ENCOUNTER — Encounter (HOSPITAL_COMMUNITY): Payer: Self-pay | Admitting: Emergency Medicine

## 2017-12-01 ENCOUNTER — Emergency Department (HOSPITAL_COMMUNITY): Payer: BLUE CROSS/BLUE SHIELD

## 2017-12-01 ENCOUNTER — Emergency Department (HOSPITAL_COMMUNITY)
Admission: EM | Admit: 2017-12-01 | Discharge: 2017-12-01 | Disposition: A | Discharge status: Home / Self Care | Payer: BLUE CROSS/BLUE SHIELD | Attending: Emergency Medicine | Admitting: Emergency Medicine

## 2017-12-01 DIAGNOSIS — Z87891 Personal history of nicotine dependence: Secondary | ICD-10-CM | POA: Insufficient documentation

## 2017-12-01 DIAGNOSIS — R509 Fever, unspecified: Secondary | ICD-10-CM | POA: Diagnosis not present

## 2017-12-01 DIAGNOSIS — I10 Essential (primary) hypertension: Secondary | ICD-10-CM | POA: Diagnosis not present

## 2017-12-01 DIAGNOSIS — R5082 Postprocedural fever: Secondary | ICD-10-CM | POA: Insufficient documentation

## 2017-12-01 DIAGNOSIS — Z79899 Other long term (current) drug therapy: Secondary | ICD-10-CM | POA: Diagnosis not present

## 2017-12-01 DIAGNOSIS — E039 Hypothyroidism, unspecified: Secondary | ICD-10-CM | POA: Insufficient documentation

## 2017-12-01 DIAGNOSIS — R000 Tachycardia, unspecified: Secondary | ICD-10-CM | POA: Diagnosis not present

## 2017-12-01 DIAGNOSIS — J45909 Unspecified asthma, uncomplicated: Secondary | ICD-10-CM | POA: Diagnosis not present

## 2017-12-01 LAB — URINALYSIS, ROUTINE W REFLEX MICROSCOPIC
BACTERIA UA: NONE SEEN
Bilirubin Urine: NEGATIVE
Glucose, UA: NEGATIVE mg/dL
Ketones, ur: NEGATIVE mg/dL
Leukocytes, UA: NEGATIVE
NITRITE: NEGATIVE
PH: 5 (ref 5.0–8.0)
Protein, ur: NEGATIVE mg/dL
SPECIFIC GRAVITY, URINE: 1.009 (ref 1.005–1.030)

## 2017-12-01 LAB — COMPREHENSIVE METABOLIC PANEL
ALBUMIN: 3.8 g/dL (ref 3.5–5.0)
ALK PHOS: 79 U/L (ref 38–126)
ALT: 17 U/L (ref 14–54)
AST: 22 U/L (ref 15–41)
Anion gap: 8 (ref 5–15)
BILIRUBIN TOTAL: 0.7 mg/dL (ref 0.3–1.2)
BUN: 16 mg/dL (ref 6–20)
CALCIUM: 9 mg/dL (ref 8.9–10.3)
CO2: 26 mmol/L (ref 22–32)
Chloride: 103 mmol/L (ref 101–111)
Creatinine, Ser: 0.92 mg/dL (ref 0.44–1.00)
GFR calc Af Amer: >60 mL/min (ref >60)
GFR calc non Af Amer: >60 mL/min (ref >60)
GLUCOSE: 112 mg/dL — AB (ref 65–99)
Potassium: 3.8 mmol/L (ref 3.5–5.1)
Sodium: 137 mmol/L (ref 135–145)
TOTAL PROTEIN: 6.8 g/dL (ref 6.5–8.1)

## 2017-12-01 LAB — I-STAT CG4 LACTIC ACID, ED
LACTIC ACID, VENOUS: 0.91 mmol/L (ref 0.5–1.9)
Lactic Acid, Venous: 1.05 mmol/L (ref 0.5–1.9)

## 2017-12-01 LAB — CBC WITH DIFFERENTIAL/PLATELET
BASOS ABS: 0 10*3/uL (ref 0.0–0.1)
BASOS PCT: 0 %
EOS PCT: 1 %
Eosinophils Absolute: 0.1 10*3/uL (ref 0.0–0.7)
HCT: 40.8 % (ref 36.0–46.0)
Hemoglobin: 13.8 g/dL (ref 12.0–15.0)
Lymphocytes Relative: 24 %
Lymphs Abs: 1.7 10*3/uL (ref 0.7–4.0)
MCH: 29.9 pg (ref 26.0–34.0)
MCHC: 33.8 g/dL (ref 30.0–36.0)
MCV: 88.3 fL (ref 78.0–100.0)
MONO ABS: 0.9 10*3/uL (ref 0.1–1.0)
MONOS PCT: 12 %
Neutro Abs: 4.6 10*3/uL (ref 1.7–7.7)
Neutrophils Relative %: 63 %
PLATELETS: 237 10*3/uL (ref 150–400)
RBC: 4.62 MIL/uL (ref 3.87–5.11)
RDW: 13.4 % (ref 11.5–15.5)
WBC: 7.2 10*3/uL (ref 4.0–10.5)

## 2017-12-01 MED ORDER — SODIUM CHLORIDE 0.9 % IV BOLUS
1000.0000 mL | Freq: Once | INTRAVENOUS | Status: AC
  Administered 2017-12-01: 1000 mL via INTRAVENOUS

## 2017-12-01 MED ORDER — HYDROMORPHONE HCL 1 MG/ML IJ SOLN
1.0000 mg | Freq: Once | INTRAMUSCULAR | Status: AC
  Administered 2017-12-01: 1 mg via INTRAVENOUS
  Filled 2017-12-01: qty 1

## 2017-12-01 MED ORDER — ONDANSETRON HCL 4 MG/2ML IJ SOLN
4.0000 mg | Freq: Once | INTRAMUSCULAR | Status: AC
  Administered 2017-12-01: 4 mg via INTRAVENOUS
  Filled 2017-12-01: qty 2

## 2017-12-01 NOTE — ED Notes (Signed)
ED Provider at bedside. 

## 2017-12-01 NOTE — ED Provider Notes (Signed)
[name] DEPT Provider Note   CSN: [id number] Arrival date & time: 12/01/17  1151     History   Chief Complaint Chief Complaint  Patient presents with  . Fever    HPI [name] is a 52 y.o. female.  Patient is a 53 year old female with a history of ankylosing spondylitis, chronic back pain, chronic bronchitis and sarcoidosis on 10 mg of prednisone daily, atypical chest pain who is presenting today with 24 hours of fever with temperature measured up to 102 in the setting of spinal stimulator placement 2 days ago.  Patient had her surgery at the [facility] center and had no complications during the surgery.  She was feeling well after surgery and is been taking 4 mg of Dilaudid twice daily for pain.  Yesterday she noticed feeling hot, chilled and generally achy.  She measured her temperature and it was 102.  She also states her back pain has become worse.  She spoke with her pain management doctor today who recommended she come here for further evaluation.  She has had no drainage from her bandage and has not noticed redness spreading over her skin.   She always has a chronic cough but does not feel that her cough is any different today, there is no sputum production and she does not feel more short of breath than normal.  She denies any new numbness or tingling in her legs.  She does not have new weakness but feels generally ill and weak.  She denies any abdominal pain, vomiting or diarrhea but has had ongoing nausea.  She denies any dysuria.  She has been taking Keflex since the surgery.  The history is provided by the patient.  Fever   This is a new problem. The current episode started yesterday. The problem occurs constantly. The problem has not changed since onset.The maximum temperature noted was 102 to 102.9 F. The temperature was taken using an oral thermometer. Pertinent negatives include no chest pain, no diarrhea, no vomiting, no  congestion, no headaches and no cough. Associated symptoms comments: Nausea and back pain. She has tried acetaminophen and ibuprofen for the symptoms. The treatment provided no relief.    Past Medical History:  Diagnosis Date  . Ankylosing spondylitis ([place])   . Anxiety   . Arthritis    "back, hands, neck" (09/18/2014)  . Asthma   . Chronic back pain    "whole spine"  . Chronic bronchitis ([place])    "get it close to q yr" (09/18/2014)  . Depression   . Environmental allergies   . Erosion of suburethral sling ([place])   . Frequency of urination   . GERD (gastroesophageal reflux disease)   . Headache    "at least several times/wk" (09/18/2014)  . Heart murmur    "when I was a baby"  . Herpes simplex type 2 infection   . HLA B27 positive   . Hypothyroidism   . IBS (irritable bowel syndrome)   . IC (interstitial cystitis)   . Migraine    "maybe once/month" (09/18/2014)  . Nocturia   . NSVD (normal spontaneous vaginal delivery)    x3  . Osteoarthritis   . Pneumonia   . RLS (restless legs syndrome)   . Sarcoid   . Seizures ([place])    "when I was a teenager"  . Shortness of breath dyspnea    Exertion  . SUI (stress urinary incontinence, female)     Patient Active Problem List  Diagnosis Date Noted  . Complicated migraine 35/00/9381  . Class 1 obesity due to excess calories with body mass index (BMI) of 32.0 to 32.9 in adult   . Neurological symptoms 03/11/2017  . Steroid-induced hyperglycemia 03/11/2017  . Depression 03/11/2017  . Essential hypertension 03/11/2017  . Hypothyroidism 03/11/2017  . Chronic pain 03/11/2017  . Palpitations 11/04/2016  . Near syncope 11/04/2016  . Dyspnea 11/04/2016  . HA (headache) 11/14/2014  . Sarcoid 11/12/2014  . Left-sided weakness   . Left hemiparesis ([place]) 09/18/2014  . Weakness 09/18/2014  . Frontal skull lesion 09/18/2014  . Atypical chest pain 09/18/2014  . Extrinsic asthma 04/25/2013  . Weight gain 01/12/2013  . Erosion of graft  11/16/2012    Past Surgical History:  Procedure Laterality Date  . ABDOMINAL HYSTERECTOMY  2006  . ANTERIOR CERVICAL DECOMP/DISCECTOMY FUSION  2011  . APPENDECTOMY  1985  . ARTHROPLASTY     Left thumb  . BACK SURGERY  2008   X 2 in 2010  . CESAREAN SECTION  2000  . CRANIECTOMY FOR DEPRESSED SKULL FRACTURE Left 12/05/2014   Procedure: Left frontal stereotactic craniectomy for biopsy of skull lesion;  Surgeon: Consuella Lose, MD;  Location: [facility];  Service: Neurosurgery;  Laterality: Left;  Left frontal stereotactic craniectomy for biopsy of skull lesion  . CYSTO WITH HYDRODISTENSION N/A 11/30/2012   Procedure: CYSTOSCOPY/HYDRODISTENSION;  Surgeon: Bernestine Amass, MD;  Location: [facility];  Service: Urology;  Laterality: N/A;  . KNEE ARTHROSCOPY Right   . LUMBAR DISC SURGERY  02-02-2007   RIGHT SIDE L5 -- S1  . LUMBAR FUSION  12-04-2008   L4  -- S1  . LYNX RETROPUBIC SUBURETHRAL SLING  03-02-2007  . MASS EXCISION Left 03/19/2017   Procedure: EXCISION LEFT WRIST VOLAR MASS;  Surgeon: Charlotte Crumb, MD;  Location: [facility];  Service: Orthopedics;  Laterality: Left;  Marland Kitchen MEDIASTINOSCOPY N/A 10/26/2014   Procedure: MEDIASTINOSCOPY;  Surgeon: Melrose Nakayama, MD;  Location: [facility];  Service: Thoracic;  Laterality: N/A;  . PELVIC LAPAROSCOPY  1990's   LYSIS ADHESIONS  . PUBOVAGINAL SLING N/A 11/30/2012   Procedure: Gaynelle Arabian;  Surgeon: Bernestine Amass, MD;  Location: [facility];  Service: Urology;  Laterality: N/A;  1 HR EXAM UNDER ANESTHESIA, EXCISION OF SUB URETHRAL MESH, CYSTO, HOD   . TONSILLECTOMY  1986  . TUBAL LIGATION  2001   hulka clip  . VIDEO BRONCHOSCOPY WITH ENDOBRONCHIAL ULTRASOUND N/A 09/21/2014   Procedure: VIDEO BRONCHOSCOPY WITH ENDOBRONCHIAL ULTRASOUND;  Surgeon: Collene Gobble, MD;  Location: [facility];  Service: Thoracic;  Laterality: N/A;     OB History    Gravida  4   Para  4   Term      Preterm      AB       Living  4     SAB      TAB      Ectopic      Multiple      Live Births               Home Medications    Prior to Admission medications   Medication Sig Start Date End Date Taking? Authorizing Provider  albuterol (PROVENTIL HFA;VENTOLIN HFA) 108 (90 BASE) MCG/ACT inhaler Inhale 2 puffs into the lungs every 6 (six) hours as needed. For shortness of breath 04/25/13  Yes Elayne Snare, MD  amLODipine (NORVASC) 5 MG tablet Take 5 mg by mouth daily.  Yes [provider]  buPROPion (WELLBUTRIN XL) 300 MG 24 hr tablet Take 300 mg by mouth at bedtime. Reported on 08/20/2015 01/15/15  Yes [provider]  diazepam (VALIUM) 2 MG tablet Take 2 mg by mouth at bedtime.   Yes [provider]  HYDROmorphone (DILAUDID) 4 MG tablet Take 4 mg by mouth 2 (two) times daily as needed for pain. 11/16/16  Yes [provider]  levothyroxine (SYNTHROID, LEVOTHROID) 137 MCG tablet Take 137 mcg by mouth daily before breakfast.    Yes [provider]  omeprazole (PRILOSEC) 40 MG capsule Take 20 mg by mouth at bedtime.    Yes [provider]  ondansetron (ZOFRAN) 4 MG tablet Take 1 tablet (4 mg total) by mouth every 8 (eight) hours as needed for nausea or vomiting. 01/29/17  Yes Seabron Spates, CNM  PARoxetine (PAXIL) 40 MG tablet Take 40 mg by mouth at bedtime. 11/29/15  Yes [provider]  predniSONE (DELTASONE) 10 MG tablet Take q day 4,4,4,3,3,3,2,2,2,1,1,1 Patient taking differently: Take 10 mg by mouth daily.  03/18/17  Yes Daleen Bo, MD  propranolol (INDERAL) 40 MG tablet Take 40 mg by mouth 2 (two) times daily. 12/01/16  Yes [provider]  rOPINIRole (REQUIP) 2 MG tablet Take 2 mg by mouth at bedtime.   Yes [provider]  traMADol (ULTRAM) 50 MG tablet Take 1 tablet (50 mg total) by mouth every 6 (six) hours as needed. Patient taking differently: Take 50 mg by mouth daily as needed (for pain or migraines).  01/08/15   Yes Caryl Ada K, PA-C  traZODone (DESYREL) 50 MG tablet Take 50 mg by mouth at bedtime.   Yes [provider]  valACYclovir (VALTREX) 500 MG tablet Take 500 mg by mouth at bedtime.   Yes [provider]  BREO ELLIPTA 100-25 MCG/INH AEPB INHALE 1 PUFF INTO THE LUNGS DAILY. 08/20/16   Chesley Mires, MD  nitroGLYCERIN (NITROSTAT) 0.4 MG SL tablet Place 0.4 mg under the tongue every 5 (five) minutes as needed for chest pain.  10/30/16   [provider]    Family History Family History  Problem Relation Age of Onset  . Diabetes Mother   . Hypertension Mother   . Heart disease Mother   . Cancer Father        LUNG AND BRAIN  . Hypertension Father   . Heart disease Maternal Grandmother   . Cancer Maternal Grandfather        LUNG  . Heart disease Paternal Grandmother   . Cancer Paternal Grandfather     Social History Social History   Tobacco Use  . Smoking status: Former Smoker    Packs/day: 0.50    Years: 15.00    Pack years: 7.50    Types: Cigarettes    Last attempt to quit: 01/15/2014    Years since quitting: 3.8  . Smokeless tobacco: Never Used  Substance Use Topics  . Alcohol use: Yes    Alcohol/week: 0.0 oz    Comment: few glasses a week  . Drug use: Yes    Comment: marijuana medicinal patches on her back - 03/18/17 states she no longer uses this     Allergies   Lyrica [pregabalin]; Adalimumab; Topiramate; Hydrocodone-acetaminophen; Oxycodone-acetaminophen; Percocet [oxycodone-acetaminophen]; Sulfamethoxazole-trimethoprim; and Vicodin [hydrocodone-acetaminophen]   Review of Systems Review of Systems  Constitutional: Positive for fever.  HENT: Negative for congestion.   Respiratory: Negative for cough.   Cardiovascular: Negative for chest pain.  Gastrointestinal: Negative  for diarrhea and vomiting.  Neurological: Negative for headaches.  All other systems reviewed and are negative.    Physical Exam Updated Vital Signs BP 120/86 (BP  Location: Right Arm)   Pulse (!) 115   Temp 98.6 F (37 C) (Oral)   Resp 20   Ht 5' 6.5" (1.689 m)   Wt 81.2 kg (179 lb)   LMP 06/03/2012   SpO2 100%   BMI 28.46 kg/m   Physical Exam  Constitutional: She is oriented to person, place, and time. She appears well-developed and well-nourished. No distress.  HENT:  Head: Normocephalic and atraumatic.  Mouth/Throat: Oropharynx is clear and moist. Mucous membranes are dry.  Eyes: Pupils are equal, round, and reactive to light. Conjunctivae and EOM are normal.  Neck: Normal range of motion. Neck supple.  Cardiovascular: Regular rhythm and intact distal pulses. Tachycardia present.  No murmur heard. Pulmonary/Chest: Effort normal and breath sounds normal. No respiratory distress. She has no wheezes. She has no rales.  Abdominal: Soft. She exhibits no distension. There is no tenderness. There is no rebound and no guarding.  Musculoskeletal: Normal range of motion. She exhibits tenderness. She exhibits no edema.       Back:  Neurological: She is alert and oriented to person, place, and time.  Normal sensation in the lower ext.  Strength in the lower ext 5/5.  Skin: Skin is warm and dry. No rash noted. No erythema.  Psychiatric: She has a normal mood and affect. Her behavior is normal.  Nursing note and vitals reviewed.    ED Treatments / Results  Labs (all labs ordered are listed, but only abnormal results are displayed) Labs Reviewed  COMPREHENSIVE METABOLIC PANEL - Abnormal; Notable for the following components:      Result Value   Glucose, Bld 112 (*)    All other components within normal limits  URINALYSIS, ROUTINE W REFLEX MICROSCOPIC - Abnormal; Notable for the following components:   Hgb urine dipstick SMALL (*)    Squamous Epithelial / LPF 0-5 (*)    All other components within normal limits  CULTURE, BLOOD (ROUTINE X 2)  CULTURE, BLOOD (ROUTINE X 2)  CBC WITH DIFFERENTIAL/PLATELET  I-STAT CG4 LACTIC ACID, ED  I-STAT  CG4 LACTIC ACID, ED    EKG None  Radiology No results found.  Procedures Procedures (including critical care time)  Medications Ordered in ED Medications  HYDROmorphone (DILAUDID) injection 1 mg (has no administration in time range)  ondansetron (ZOFRAN) injection 4 mg (has no administration in time range)  sodium chloride 0.9 % bolus 1,000 mL (has no administration in time range)     Initial Impression / Assessment and Plan / ED Course  I have reviewed the triage vital signs and the nursing notes.  Pertinent labs & imaging results that were available during my care of the patient were reviewed by me and considered in my medical decision making (see chart for details).     Patient is a 52 year old female who developed fever yesterday and has been persistent for the last 24 hours in the setting of deep spinal stimulator placed 2 days ago on Monday.  Patient had no complications with the surgery but since she had been running a fever since last night her pain management doctor at [facility] recommended she come for evaluation.  Patient is afebrile here but states she took ibuprofen and Tylenol prior to being checked.  She is mildly tachycardic but otherwise stable in appearance.  Her surgical incisions appear  normal without drainage or signs of cellulitis.  Speaking with Dr. Evelene Croon her pain management doctor he states she received intraoperative antibiotics and also the stimulator is in the setting of vancomycin pocket and with only 2 days out it would be highly unusual for this area to be infected.  He did not recommend any imaging at this time and patient has no neurologic symptoms concerning for spinal cord involvement.  Patient does have sarcoidosis and does take prednisone daily but denies any new cough or worsening shortness of breath.  She is not tachypneic lung sounds are clear and oxygen saturation is 100% on room air.  We will do an x-ray to ensure no other acute process.   Patient's lactate is within normal limits, her CMP without acute findings and her CBC without leukocytosis.  Patient is taking Keflex currently.  UA is pending.  4:54 PM Will give IV fluids, patient has not had any pain medication since yesterday so feel that is exacerbating her symptoms.  Will control pain.  We will get blood cultures.  However if x-ray and urine are within normal limits and speaking with her specialist feel that she will be cleared for discharge home and close follow-up with her doctor.  UA wnl.  Final Clinical Impressions(s) / ED Diagnoses   Final diagnoses:  Fever postop    ED Discharge Orders    None       Blanchie Dessert, MD 12/01/17 [place] [place]

## 2017-12-01 NOTE — ED Triage Notes (Signed)
Pt reports that she had spinal cord stimulator implanted Monday at pain center since Monday night been having fevers.  Called surgeon today and was instructed to go to ED for further evaluation. Reports took Nyquil with fever reducer an hour ago.

## 2017-12-06 LAB — CULTURE, BLOOD (ROUTINE X 2): Culture: NO GROWTH

## 2017-12-10 DIAGNOSIS — M961 Postlaminectomy syndrome, not elsewhere classified: Secondary | ICD-10-CM | POA: Diagnosis not present

## 2017-12-10 DIAGNOSIS — G894 Chronic pain syndrome: Secondary | ICD-10-CM | POA: Diagnosis not present

## 2017-12-10 DIAGNOSIS — M792 Neuralgia and neuritis, unspecified: Secondary | ICD-10-CM | POA: Diagnosis not present

## 2017-12-28 ENCOUNTER — Encounter: Payer: Self-pay | Admitting: Pulmonary Disease

## 2017-12-28 ENCOUNTER — Ambulatory Visit: Payer: BLUE CROSS/BLUE SHIELD | Admitting: Pulmonary Disease

## 2017-12-28 ENCOUNTER — Ambulatory Visit: Payer: BLUE CROSS/BLUE SHIELD

## 2017-12-28 VITALS — BP 130/88 | HR 95 | Ht 66.0 in | Wt 179.0 lb

## 2017-12-28 DIAGNOSIS — J019 Acute sinusitis, unspecified: Secondary | ICD-10-CM | POA: Diagnosis not present

## 2017-12-28 DIAGNOSIS — J4530 Mild persistent asthma, uncomplicated: Secondary | ICD-10-CM | POA: Diagnosis not present

## 2017-12-28 DIAGNOSIS — H6690 Otitis media, unspecified, unspecified ear: Secondary | ICD-10-CM

## 2017-12-28 DIAGNOSIS — D869 Sarcoidosis, unspecified: Secondary | ICD-10-CM

## 2017-12-28 MED ORDER — AMOXICILLIN-POT CLAVULANATE 875-125 MG PO TABS: 1.0000 | ORAL_TABLET | Freq: Two times a day (BID) | ORAL | 0 refills | Status: DC

## 2017-12-28 NOTE — Progress Notes (Signed)
[facility]  Chief Complaint  Patient presents with  . Follow-up    Pt has SOB with exertion, no 59mn walk was completed, dry cough, some chest tightness.    Vital signs: BP 130/88 (BP Location: Left Arm, Cuff Size: Normal)   Pulse 95   Ht 5' 6" (1.676 m)   Wt 179 lb (81.2 kg)   LMP 06/03/2012   SpO2 95%   BMI 28.89 kg/m   History of Present Illness: RNOREEN MACKINTOSHis a 52y.o. female with sarcoidosis and asthma.  She has intermittent fever, and swelling in her Lt neck glands.  Has tenderness in left sinus and ear.  Not having cough, wheeze, or sputum.  Gets winded with brisk activity.  No rashes.  Remains on prednisone.    Physical Exam:  General - pleasant Eyes - pupils reactive ENT - left sinus tenderness, cloudy TM on Lt Cardiac - regular, no murmur Chest - no wheeze, rales Abd - soft, non tender Ext - no edema Skin - no rashes Neuro - normal strength Psych - normal mood  CXR 12/01/17 >> no acute disease process   Assessment/Plan:  Systemic sarcoidosis with polyarthritis and HLA B27 positive. - advised her to f/u with Dr. HTrudie Reedto determine whether she needs to remain on prednisone  Persistent asthma. - continue breo and prn albuterol  Acute sinusitis with Lt otitis media. - augmentin   Patient Instructions  Augmentin 1 pill twice daily  Follow up in 1 year    VChesley Mires MD [place]4/30/2019, 12:17 PM  Flow Sheet  Events 09/20/14 Quantiferon gold >> negative 10/26/14 Mediastinoscopy >> granulomas  Pulmonary function tests 02/05/15 >> FEV1 2.96 (94%), FEV1% 80, TLC 5.27 (95%), DLCO 80%, +BD  Imaging 09/18/14 CT head >> 12 x 6 mm Lt frontal lytic lesion 09/18/14 MRI brain >> 12 mm Lt frontal lytic lesion 09/18/14 Bone scan >> no other lesions 09/19/14 CT chest >> Rt paratracheal LAN 1.5 cm, Rt subcarinal LAN 2 cm, Rt hilar LAN 1.4 cm, Lt hilar LAN 1.7 cm 09/19/14 CT abd/pelvis  >> 1.6 cm LAN at gastrohepatic ligament, 1.3 cm LAN at portal vein, 0.9 cm LAN Rt renal artery 04/30/15 CT chest >> decreased mediastinal and hilar LAN, stable 2 mm nodule RML 11/04/16 CT chest >> mild LAN, coronary calcification 11/17/16 Cardiac MR >> EF 69%, normal RV fx, negative for sarcoidosis  Cardiac tests Echo 10/16/16 >> mild LVH, EF 55 to 60% LHC 10/16/16 >> non obstructive CAD  Serology 09/20/14 ACE 76, ANA negative 01/01/15 ACE 26 09/03/15 ACE 60 10/01/15 ACE 19  Past Medical History: She  has a past medical history of [place] ([place], Anxiety, Arthritis, Asthma, Chronic back pain, Chronic bronchitis ([place], Depression, Environmental allergies, E[place] of [place]urethral [place]ing ([place], Frequency of urination, GERD (gastroesophageal reflux disease), Headache, Heart murmur, Herpes simplex type 2 infection, HLA B27 positive, Hypothyroidism, IBS (irritable bowel syndrome), IC (interstitial cystitis), Migraine, Nocturia, NSVD (normal spontaneous vaginal delivery), Osteoarthritis, Pneumonia, RLS (restless legs syndrome), Sarcoid, Seizures ([place], Shortness of breath dyspnea, and SUI (stress urinary incontinence, female).  Past Surgical History: She  has a past surgical history that includes Cesarean section (2000); Pelvic laparoscopy (1990's); Tonsillectomy (1986); Appendectomy (1985); Anterior cervical decomp/discectomy fusion (2011); Abdominal hysterectomy (2006); Knee arthroscopy (Right); Lumbar fusion (12-04-2008); Lumbar disc surgery (02-02-2007); LYNX RETROPUBIC SUBURETHRAL SLING (03-02-2007); Tubal ligation (2001); Pubovaginal sling (N/A, 11/30/2012); cysto with hydrodistension (N/A, 11/30/2012); Video bronchoscopy with endobronchial ultrasound (N/A, 09/21/2014);  Mediastinoscopy (N/A, 10/26/2014); Back surgery (2008); Craniectomy for depressed skull fracture (Left, 12/05/2014); Arthroplasty; and Mass excision (Left, 03/19/2017).  Family History: Her family history includes Cancer in her  father, maternal grandfather, and paternal grandfather; Diabetes in her mother; Heart disease in her maternal grandmother, mother, and paternal grandmother; Hypertension in her father and mother.  Social History: She  reports that she quit smoking about 3 years ago. Her smoking use included cigarettes. She has a 7.50 pack-year smoking history. She has never used smokeless tobacco. She reports that she drinks about 1.2 oz of alcohol per week. She reports that she has current or past drug history.  Medications: Allergies as of 12/28/2017      Reactions   Lyrica [pregabalin] Anaphylaxis   Adalimumab Other (See Comments)   Muscle weakness Other reaction(s): Other (See Comments) Muscle weakness   Topiramate    Other reaction(s): Mental Status Changes (intolerance) Pt had hallucinations   Hydrocodone-acetaminophen Itching   Oxycodone-acetaminophen Itching   Percocet [oxycodone-acetaminophen] Itching   Sulfamethoxazole-trimethoprim Other (See Comments)   Fever Other reaction(s): Fever (intolerance)   Vicodin [hydrocodone-acetaminophen] Itching      Medication List        Accurate as of 12/28/17 12:17 PM. Always use your most recent med list.          albuterol 108 (90 Base) MCG/ACT inhaler Commonly known as:  PROVENTIL HFA;VENTOLIN HFA Inhale 2 puffs into the lungs every 6 (six) hours as needed. For shortness of breath   amLODipine 5 MG tablet Commonly known as:  NORVASC Take 5 mg by mouth daily.   amoxicillin-clavulanate 875-125 MG tablet Commonly known as:  AUGMENTIN Take 1 tablet by mouth 2 (two) times daily.   BREO ELLIPTA 100-25 MCG/INH Aepb Generic drug:  fluticasone furoate-vilanterol INHALE 1 PUFF INTO THE LUNGS DAILY.   buPROPion 300 MG 24 hr tablet Commonly known as:  WELLBUTRIN XL Take 300 mg by mouth at bedtime. Reported on 08/20/2015   diazepam 2 MG tablet Commonly known as:  VALIUM Take 2 mg by mouth at bedtime.   HYDROmorphone 4 MG tablet Commonly known  as:  DILAUDID Take 4 mg by mouth 2 (two) times daily as needed for pain.   levothyroxine 137 MCG tablet Commonly known as:  SYNTHROID, LEVOTHROID Take 137 mcg by mouth daily before breakfast.   nitroGLYCERIN 0.4 MG SL tablet Commonly known as:  NITROSTAT Place 0.4 mg under the tongue every 5 (five) minutes as needed for chest pain.   omeprazole 40 MG capsule Commonly known as:  PRILOSEC Take 20 mg by mouth at bedtime.   ondansetron 4 MG tablet Commonly known as:  ZOFRAN Take 1 tablet (4 mg total) by mouth every 8 (eight) hours as needed for nausea or vomiting.   PARoxetine 40 MG tablet Commonly known as:  PAXIL Take 40 mg by mouth at bedtime.   predniSONE 10 MG tablet Commonly known as:  DELTASONE Take q day 4,4,4,3,3,3,2,2,2,1,1,1   propranolol 40 MG tablet Commonly known as:  INDERAL Take 40 mg by mouth 2 (two) times daily.   rOPINIRole 2 MG tablet Commonly known as:  REQUIP Take 2 mg by mouth at bedtime.   traMADol 50 MG tablet Commonly known as:  ULTRAM Take 1 tablet (50 mg total) by mouth every 6 (six) hours as needed.   traZODone 50 MG tablet Commonly known as:  DESYREL Take 50 mg by mouth at bedtime.   valACYclovir 500 MG tablet Commonly known as:  VALTREX Take 500 mg  by mouth at bedtime.

## 2017-12-28 NOTE — Patient Instructions (Signed)
Augmentin 1 pill twice daily  Follow up in 1 year

## 2018-01-05 DIAGNOSIS — M150 Primary generalized (osteo)arthritis: Secondary | ICD-10-CM | POA: Diagnosis not present

## 2018-01-05 DIAGNOSIS — Z1589 Genetic susceptibility to other disease: Secondary | ICD-10-CM | POA: Diagnosis not present

## 2018-01-05 DIAGNOSIS — M2550 Pain in unspecified joint: Secondary | ICD-10-CM | POA: Diagnosis not present

## 2018-01-05 DIAGNOSIS — D8689 Sarcoidosis of other sites: Secondary | ICD-10-CM | POA: Diagnosis not present

## 2018-01-07 DIAGNOSIS — Z9689 Presence of other specified functional implants: Secondary | ICD-10-CM | POA: Diagnosis not present

## 2018-01-07 DIAGNOSIS — G894 Chronic pain syndrome: Secondary | ICD-10-CM | POA: Diagnosis not present

## 2018-01-07 DIAGNOSIS — M961 Postlaminectomy syndrome, not elsewhere classified: Secondary | ICD-10-CM | POA: Diagnosis not present

## 2018-01-07 DIAGNOSIS — M792 Neuralgia and neuritis, unspecified: Secondary | ICD-10-CM | POA: Diagnosis not present

## 2018-01-14 DIAGNOSIS — M961 Postlaminectomy syndrome, not elsewhere classified: Secondary | ICD-10-CM | POA: Diagnosis not present

## 2018-01-14 DIAGNOSIS — Z79899 Other long term (current) drug therapy: Secondary | ICD-10-CM | POA: Diagnosis not present

## 2018-01-14 DIAGNOSIS — M47816 Spondylosis without myelopathy or radiculopathy, lumbar region: Secondary | ICD-10-CM | POA: Diagnosis not present

## 2018-01-14 DIAGNOSIS — M455 Ankylosing spondylitis of thoracolumbar region: Secondary | ICD-10-CM | POA: Diagnosis not present

## 2018-01-14 DIAGNOSIS — M5416 Radiculopathy, lumbar region: Secondary | ICD-10-CM | POA: Diagnosis not present

## 2018-02-07 DIAGNOSIS — L03316 Cellulitis of umbilicus: Secondary | ICD-10-CM | POA: Diagnosis not present

## 2018-02-07 DIAGNOSIS — M459 Ankylosing spondylitis of unspecified sites in spine: Secondary | ICD-10-CM | POA: Diagnosis not present

## 2018-02-07 DIAGNOSIS — M545 Low back pain: Secondary | ICD-10-CM | POA: Diagnosis not present

## 2018-02-23 DIAGNOSIS — E860 Dehydration: Secondary | ICD-10-CM | POA: Diagnosis not present

## 2018-02-23 DIAGNOSIS — M26609 Unspecified temporomandibular joint disorder, unspecified side: Secondary | ICD-10-CM | POA: Diagnosis not present

## 2018-02-24 DIAGNOSIS — M459 Ankylosing spondylitis of unspecified sites in spine: Secondary | ICD-10-CM | POA: Diagnosis not present

## 2018-02-24 DIAGNOSIS — G8929 Other chronic pain: Secondary | ICD-10-CM | POA: Diagnosis not present

## 2018-02-24 DIAGNOSIS — T8189XA Other complications of procedures, not elsewhere classified, initial encounter: Secondary | ICD-10-CM | POA: Diagnosis not present

## 2018-03-17 DIAGNOSIS — Z79899 Other long term (current) drug therapy: Secondary | ICD-10-CM | POA: Diagnosis not present

## 2018-03-26 DIAGNOSIS — M459 Ankylosing spondylitis of unspecified sites in spine: Secondary | ICD-10-CM | POA: Diagnosis not present

## 2018-04-25 DIAGNOSIS — E039 Hypothyroidism, unspecified: Secondary | ICD-10-CM | POA: Diagnosis not present

## 2018-04-25 DIAGNOSIS — R7309 Other abnormal glucose: Secondary | ICD-10-CM | POA: Diagnosis not present

## 2018-04-25 DIAGNOSIS — I10 Essential (primary) hypertension: Secondary | ICD-10-CM | POA: Diagnosis not present

## 2018-04-26 DIAGNOSIS — M459 Ankylosing spondylitis of unspecified sites in spine: Secondary | ICD-10-CM | POA: Diagnosis not present

## 2018-04-27 ENCOUNTER — Other Ambulatory Visit: Payer: Self-pay | Admitting: *Deleted

## 2018-04-27 MED ORDER — FLUTICASONE FUROATE-VILANTEROL 100-25 MCG/INH IN AEPB: 1.0000 | INHALATION_SPRAY | Freq: Every day | RESPIRATORY_TRACT | 5 refills | Status: DC

## 2018-05-04 DIAGNOSIS — M47816 Spondylosis without myelopathy or radiculopathy, lumbar region: Secondary | ICD-10-CM | POA: Diagnosis not present

## 2018-05-04 DIAGNOSIS — M455 Ankylosing spondylitis of thoracolumbar region: Secondary | ICD-10-CM | POA: Diagnosis not present

## 2018-05-04 DIAGNOSIS — Z9689 Presence of other specified functional implants: Secondary | ICD-10-CM | POA: Diagnosis not present

## 2018-05-04 DIAGNOSIS — M961 Postlaminectomy syndrome, not elsewhere classified: Secondary | ICD-10-CM | POA: Diagnosis not present

## 2018-05-13 ENCOUNTER — Emergency Department (HOSPITAL_COMMUNITY)
Admission: EM | Admit: 2018-05-13 | Discharge: 2018-05-13 | Disposition: A | Discharge status: Home / Self Care | Payer: BLUE CROSS/BLUE SHIELD | Attending: Emergency Medicine | Admitting: Emergency Medicine

## 2018-05-13 ENCOUNTER — Encounter (HOSPITAL_COMMUNITY): Payer: Self-pay | Admitting: *Deleted

## 2018-05-13 ENCOUNTER — Emergency Department (HOSPITAL_COMMUNITY): Payer: BLUE CROSS/BLUE SHIELD

## 2018-05-13 ENCOUNTER — Other Ambulatory Visit: Payer: Self-pay

## 2018-05-13 DIAGNOSIS — Z79899 Other long term (current) drug therapy: Secondary | ICD-10-CM | POA: Insufficient documentation

## 2018-05-13 DIAGNOSIS — E039 Hypothyroidism, unspecified: Secondary | ICD-10-CM | POA: Diagnosis not present

## 2018-05-13 DIAGNOSIS — J45909 Unspecified asthma, uncomplicated: Secondary | ICD-10-CM | POA: Diagnosis not present

## 2018-05-13 DIAGNOSIS — R251 Tremor, unspecified: Secondary | ICD-10-CM

## 2018-05-13 DIAGNOSIS — R569 Unspecified convulsions: Secondary | ICD-10-CM | POA: Diagnosis not present

## 2018-05-13 DIAGNOSIS — R51 Headache: Secondary | ICD-10-CM | POA: Insufficient documentation

## 2018-05-13 DIAGNOSIS — Z87891 Personal history of nicotine dependence: Secondary | ICD-10-CM | POA: Insufficient documentation

## 2018-05-13 LAB — BASIC METABOLIC PANEL
Anion gap: 10 (ref 5–15)
BUN: 22 mg/dL — ABNORMAL HIGH (ref 6–20)
CO2: 23 mmol/L (ref 22–32)
Calcium: 9 mg/dL (ref 8.9–10.3)
Chloride: 105 mmol/L (ref 98–111)
Creatinine, Ser: 1.02 mg/dL — ABNORMAL HIGH (ref 0.44–1.00)
GFR calc non Af Amer: >60 mL/min (ref >60)
Glucose, Bld: 153 mg/dL — ABNORMAL HIGH (ref 70–99)
POTASSIUM: 4 mmol/L (ref 3.5–5.1)
SODIUM: 138 mmol/L (ref 135–145)

## 2018-05-13 LAB — CBC
HEMATOCRIT: 41.4 % (ref 36.0–46.0)
Hemoglobin: 13.7 g/dL (ref 12.0–15.0)
MCH: 28.6 pg (ref 26.0–34.0)
MCHC: 33.1 g/dL (ref 30.0–36.0)
MCV: 86.4 fL (ref 78.0–100.0)
Platelets: 316 10*3/uL (ref 150–400)
RBC: 4.79 MIL/uL (ref 3.87–5.11)
RDW: 14.8 % (ref 11.5–15.5)
WBC: 6.2 10*3/uL (ref 4.0–10.5)

## 2018-05-13 MED ORDER — HYDROMORPHONE HCL 2 MG PO TABS
4.0000 mg | ORAL_TABLET | Freq: Once | ORAL | Status: AC
  Administered 2018-05-13: 4 mg via ORAL
  Filled 2018-05-13: qty 2

## 2018-05-13 NOTE — ED Triage Notes (Signed)
Pt in c/o possible seizures, no history of same, states she will be awake and then wake up and be convulsing, she is aware of the convulsing and then goes back to sleep, husband reports she had an episode where both of her legs were convulsing, pt was again awake and alert, events like this are abnormal for her and new since last Friday, pt reports a medication changes last Friday, taken off trazadone and started on belsomra for sleep

## 2018-05-13 NOTE — ED Notes (Signed)
Pt given d/c paperwork and instructions for follow up. Pt verbalized understanding. Ambulated to lobby with husband.

## 2018-05-13 NOTE — Discharge Instructions (Addendum)
Follow-up with your neurologist as we discussed.  Return here for any problems

## 2018-05-13 NOTE — ED Provider Notes (Signed)
[facility] EMERGENCY DEPARTMENT Provider Note   CSN: [id number] Arrival date & time: 05/13/18  6295     History   Chief Complaint Chief Complaint  Patient presents with  . Seizures    HPI [name] is a 52 y.o. female.  52 year old female with history of chronic pain presents with possible seizure activity.  Patient characterizes this as lower limbs shaking as well as sometimes upper extremity shaking.  States that she woke up with this but was awake and alert when this was going on.  Denies having any loss of bowel or bladder function.  No tongue injury.  She has had mild headaches for some time.  Questionable history of MS.  No fever or chills.  Did have a recent medication change to help her sleep better.  States that the symptoms wax and wane and can last for 10 to 15 minutes.  Has not had much disturbance to her gait.  Is scheduled to have an outpatient EEG by her neurologist Dr. Ermalene Postin.     Past Medical History:  Diagnosis Date  . Ankylosing spondylitis ([place])   . Anxiety   . Arthritis    "back, hands, neck" (09/18/2014)  . Asthma   . Chronic back pain    "whole spine"  . Chronic bronchitis ([place])    "get it close to q yr" (09/18/2014)  . Depression   . Environmental allergies   . Erosion of suburethral sling ([place])   . Frequency of urination   . GERD (gastroesophageal reflux disease)   . Headache    "at least several times/wk" (09/18/2014)  . Heart murmur    "when I was a baby"  . Herpes simplex type 2 infection   . HLA B27 positive   . Hypothyroidism   . IBS (irritable bowel syndrome)   . IC (interstitial cystitis)   . Migraine    "maybe once/month" (09/18/2014)  . Nocturia   . NSVD (normal spontaneous vaginal delivery)    x3  . Osteoarthritis   . Pneumonia   . RLS (restless legs syndrome)   . Sarcoid   . Seizures ([place])    "when I was a teenager"  . Shortness of breath dyspnea    Exertion  . SUI (stress urinary incontinence,  female)     Patient Active Problem List   Diagnosis Date Noted  . Complicated migraine 28/41/3244  . Class 1 obesity due to excess calories with body mass index (BMI) of 32.0 to 32.9 in adult   . Neurological symptoms 03/11/2017  . Steroid-induced hyperglycemia 03/11/2017  . Depression 03/11/2017  . Essential hypertension 03/11/2017  . Hypothyroidism 03/11/2017  . Chronic pain 03/11/2017  . Palpitations 11/04/2016  . Near syncope 11/04/2016  . Dyspnea 11/04/2016  . HA (headache) 11/14/2014  . Sarcoid 11/12/2014  . Left-sided weakness   . Left hemiparesis ([place]) 09/18/2014  . Weakness 09/18/2014  . Frontal skull lesion 09/18/2014  . Atypical chest pain 09/18/2014  . Extrinsic asthma 04/25/2013  . Weight gain 01/12/2013  . Erosion of graft 11/16/2012    Past Surgical History:  Procedure Laterality Date  . ABDOMINAL HYSTERECTOMY  2006  . ANTERIOR CERVICAL DECOMP/DISCECTOMY FUSION  2011  . APPENDECTOMY  1985  . ARTHROPLASTY     Left thumb  . BACK SURGERY  2008   X 2 in 2010  . CESAREAN SECTION  2000  . CRANIECTOMY FOR DEPRESSED SKULL FRACTURE Left 12/05/2014   Procedure: Left frontal stereotactic craniectomy for biopsy  of skull lesion;  Surgeon: Consuella Lose, MD;  Location: [facility];  Service: Neurosurgery;  Laterality: Left;  Left frontal stereotactic craniectomy for biopsy of skull lesion  . CYSTO WITH HYDRODISTENSION N/A 11/30/2012   Procedure: CYSTOSCOPY/HYDRODISTENSION;  Surgeon: Bernestine Amass, MD;  Location: [facility];  Service: Urology;  Laterality: N/A;  . KNEE ARTHROSCOPY Right   . LUMBAR DISC SURGERY  02-02-2007   RIGHT SIDE L5 -- S1  . LUMBAR FUSION  12-04-2008   L4  -- S1  . LYNX RETROPUBIC SUBURETHRAL SLING  03-02-2007  . MASS EXCISION Left 03/19/2017   Procedure: EXCISION LEFT WRIST VOLAR MASS;  Surgeon: Charlotte Crumb, MD;  Location: [facility];  Service: Orthopedics;  Laterality: Left;  Marland Kitchen MEDIASTINOSCOPY N/A 10/26/2014   Procedure:  MEDIASTINOSCOPY;  Surgeon: Melrose Nakayama, MD;  Location: [facility];  Service: Thoracic;  Laterality: N/A;  . PELVIC LAPAROSCOPY  1990's   LYSIS ADHESIONS  . PUBOVAGINAL SLING N/A 11/30/2012   Procedure: Gaynelle Arabian;  Surgeon: Bernestine Amass, MD;  Location: [facility];  Service: Urology;  Laterality: N/A;  1 HR EXAM UNDER ANESTHESIA, EXCISION OF SUB URETHRAL MESH, CYSTO, HOD   . TONSILLECTOMY  1986  . TUBAL LIGATION  2001   hulka clip  . VIDEO BRONCHOSCOPY WITH ENDOBRONCHIAL ULTRASOUND N/A 09/21/2014   Procedure: VIDEO BRONCHOSCOPY WITH ENDOBRONCHIAL ULTRASOUND;  Surgeon: Collene Gobble, MD;  Location: [facility];  Service: Thoracic;  Laterality: N/A;     OB History    Gravida  4   Para  4   Term      Preterm      AB      Living  4     SAB      TAB      Ectopic      Multiple      Live Births               Home Medications    Prior to Admission medications   Medication Sig Start Date End Date Taking? Authorizing Provider  albuterol (PROVENTIL HFA;VENTOLIN HFA) 108 (90 BASE) MCG/ACT inhaler Inhale 2 puffs into the lungs every 6 (six) hours as needed. For shortness of breath 04/25/13   Elayne Snare, MD  amLODipine (NORVASC) 5 MG tablet Take 5 mg by mouth daily.     [provider]  amoxicillin-clavulanate (AUGMENTIN) 875-125 MG tablet Take 1 tablet by mouth 2 (two) times daily. 12/28/17   Chesley Mires, MD  buPROPion (WELLBUTRIN XL) 300 MG 24 hr tablet Take 300 mg by mouth at bedtime. Reported on 08/20/2015 01/15/15   [provider]  diazepam (VALIUM) 2 MG tablet Take 2 mg by mouth at bedtime.    [provider]  fluticasone furoate-vilanterol (BREO ELLIPTA) 100-25 MCG/INH AEPB Inhale 1 puff into the lungs daily. 04/27/18   Chesley Mires, MD  HYDROmorphone (DILAUDID) 4 MG tablet Take 4 mg by mouth 2 (two) times daily as needed for pain. 11/16/16   [provider]  levothyroxine (SYNTHROID, LEVOTHROID) 137 MCG tablet  Take 137 mcg by mouth daily before breakfast.     [provider]  nitroGLYCERIN (NITROSTAT) 0.4 MG SL tablet Place 0.4 mg under the tongue every 5 (five) minutes as needed for chest pain.  10/30/16   [provider]  omeprazole (PRILOSEC) 40 MG capsule Take 20 mg by mouth at bedtime.     [provider]  ondansetron (ZOFRAN) 4 MG tablet Take 1  tablet (4 mg total) by mouth every 8 (eight) hours as needed for nausea or vomiting. 01/29/17   Seabron Spates, CNM  PARoxetine (PAXIL) 40 MG tablet Take 40 mg by mouth at bedtime. 11/29/15   [provider]  predniSONE (DELTASONE) 10 MG tablet Take q day 4,4,4,3,3,3,2,2,2,1,1,1 Patient taking differently: Take 10 mg by mouth daily.  03/18/17   Daleen Bo, MD  propranolol (INDERAL) 40 MG tablet Take 40 mg by mouth 2 (two) times daily. 12/01/16   [provider]  rOPINIRole (REQUIP) 2 MG tablet Take 2 mg by mouth at bedtime.    [provider]  traMADol (ULTRAM) 50 MG tablet Take 1 tablet (50 mg total) by mouth every 6 (six) hours as needed. Patient taking differently: Take 50 mg by mouth daily as needed (for pain or migraines).  01/08/15   Fransico Meadow, PA-C  traZODone (DESYREL) 50 MG tablet Take 50 mg by mouth at bedtime.    [provider]  valACYclovir (VALTREX) 500 MG tablet Take 500 mg by mouth at bedtime.    [provider]    Family History Family History  Problem Relation Age of Onset  . Diabetes Mother   . Hypertension Mother   . Heart disease Mother   . Cancer Father        LUNG AND BRAIN  . Hypertension Father   . Heart disease Maternal Grandmother   . Cancer Maternal Grandfather        LUNG  . Heart disease Paternal Grandmother   . Cancer Paternal Grandfather     Social History Social History   Tobacco Use  . Smoking status: Former Smoker    Packs/day: 0.50    Years: 15.00    Pack years: 7.50    Types: Cigarettes    Last attempt to quit: 01/15/2014     Years since quitting: 4.3  . Smokeless tobacco: Never Used  Substance Use Topics  . Alcohol use: Yes    Alcohol/week: 2.0 standard drinks    Types: 2 Glasses of wine per week    Comment: few glasses a week  . Drug use: Yes    Comment: marijuana medicinal patches on her back - 03/18/17 states she no longer uses this     Allergies   Lyrica [pregabalin]; Adalimumab; Topiramate; Hydrocodone-acetaminophen; Oxycodone-acetaminophen; Percocet [oxycodone-acetaminophen]; Sulfamethoxazole-trimethoprim; and Vicodin [hydrocodone-acetaminophen]   Review of Systems Review of Systems  All other systems reviewed and are negative.    Physical Exam Updated Vital Signs BP 125/86 (BP Location: Right Arm)   Pulse (!) 103   Temp 98 F (36.7 C) (Oral)   Resp 20   LMP 06/03/2012   SpO2 100%   Physical Exam  Constitutional: She is oriented to person, place, and time. She appears well-developed and well-nourished.  Non-toxic appearance. No distress.  HENT:  Head: Normocephalic and atraumatic.  Eyes: Pupils are equal, round, and reactive to light. Conjunctivae, EOM and lids are normal.  Neck: Normal range of motion. Neck supple. No tracheal deviation present. No thyroid mass present.  Cardiovascular: Normal rate, regular rhythm and normal heart sounds. Exam reveals no gallop.  No murmur heard. Pulmonary/Chest: Effort normal and breath sounds normal. No stridor. No respiratory distress. She has no decreased breath sounds. She has no wheezes. She has no rhonchi. She has no rales.  Abdominal: Soft. Normal appearance and bowel sounds are normal. She exhibits no distension. There is no tenderness. There is no rebound and no CVA tenderness.  Musculoskeletal:  Normal range of motion. She exhibits no edema or tenderness.  Neurological: She is alert and oriented to person, place, and time. She has normal strength. No cranial nerve deficit or sensory deficit. GCS eye subscore is 4. GCS verbal subscore is 5. GCS  motor subscore is 6.  Skin: Skin is warm and dry. No abrasion and no rash noted.  Psychiatric: She has a normal mood and affect. Her speech is normal and behavior is normal.  Nursing note and vitals reviewed.    ED Treatments / Results  Labs (all labs ordered are listed, but only abnormal results are displayed) Labs Reviewed  BASIC METABOLIC PANEL - Abnormal; Notable for the following components:      Result Value   Glucose, Bld 153 (*)    BUN 22 (*)    Creatinine, Ser 1.02 (*)    All other components within normal limits  CBC    EKG None  Radiology No results found.  Procedures Procedures (including critical care time)  Medications Ordered in ED Medications  HYDROmorphone (DILAUDID) tablet 4 mg (has no administration in time range)     Initial Impression / Assessment and Plan / ED Course  I have reviewed the triage vital signs and the nursing notes.  Pertinent labs & imaging results that were available during my care of the patient were reviewed by me and considered in my medical decision making (see chart for details).     Head CT negative.  Case discussed with patient's neurologist Dr. Ermalene Postin and patient has had no seizures here in the department and he will follow-up with the patient to arrange for an outpatient EEG  Final Clinical Impressions(s) / ED Diagnoses   Final diagnoses:  None    ED Discharge Orders    None       Lacretia Leigh, MD 05/13/18 1429

## 2018-05-13 NOTE — ED Notes (Addendum)
Patient transported to CT 

## 2018-05-13 NOTE — ED Notes (Signed)
ED Provider at bedside. 

## 2018-05-20 DIAGNOSIS — R404 Transient alteration of awareness: Secondary | ICD-10-CM | POA: Diagnosis not present

## 2018-05-27 DIAGNOSIS — M459 Ankylosing spondylitis of unspecified sites in spine: Secondary | ICD-10-CM | POA: Diagnosis not present

## 2018-06-07 DIAGNOSIS — R509 Fever, unspecified: Secondary | ICD-10-CM | POA: Diagnosis not present

## 2018-06-07 DIAGNOSIS — Z23 Encounter for immunization: Secondary | ICD-10-CM | POA: Diagnosis not present

## 2018-06-07 DIAGNOSIS — E039 Hypothyroidism, unspecified: Secondary | ICD-10-CM | POA: Diagnosis not present

## 2018-06-07 DIAGNOSIS — D869 Sarcoidosis, unspecified: Secondary | ICD-10-CM | POA: Diagnosis not present

## 2018-06-26 DIAGNOSIS — M459 Ankylosing spondylitis of unspecified sites in spine: Secondary | ICD-10-CM | POA: Diagnosis not present

## 2018-06-29 ENCOUNTER — Other Ambulatory Visit: Payer: Self-pay

## 2018-06-29 ENCOUNTER — Emergency Department (HOSPITAL_BASED_OUTPATIENT_CLINIC_OR_DEPARTMENT_OTHER): Payer: BLUE CROSS/BLUE SHIELD

## 2018-06-29 ENCOUNTER — Encounter (HOSPITAL_BASED_OUTPATIENT_CLINIC_OR_DEPARTMENT_OTHER): Payer: Self-pay | Admitting: Emergency Medicine

## 2018-06-29 ENCOUNTER — Emergency Department (HOSPITAL_BASED_OUTPATIENT_CLINIC_OR_DEPARTMENT_OTHER)
Admission: EM | Admit: 2018-06-29 | Discharge: 2018-06-29 | Disposition: A | Discharge status: Home / Self Care | Payer: BLUE CROSS/BLUE SHIELD | Attending: Emergency Medicine | Admitting: Emergency Medicine

## 2018-06-29 DIAGNOSIS — R110 Nausea: Secondary | ICD-10-CM | POA: Diagnosis not present

## 2018-06-29 DIAGNOSIS — R0789 Other chest pain: Secondary | ICD-10-CM | POA: Insufficient documentation

## 2018-06-29 DIAGNOSIS — E079 Disorder of thyroid, unspecified: Secondary | ICD-10-CM | POA: Diagnosis not present

## 2018-06-29 DIAGNOSIS — Z87891 Personal history of nicotine dependence: Secondary | ICD-10-CM | POA: Diagnosis not present

## 2018-06-29 DIAGNOSIS — Z79899 Other long term (current) drug therapy: Secondary | ICD-10-CM | POA: Diagnosis not present

## 2018-06-29 DIAGNOSIS — I10 Essential (primary) hypertension: Secondary | ICD-10-CM | POA: Insufficient documentation

## 2018-06-29 DIAGNOSIS — R61 Generalized hyperhidrosis: Secondary | ICD-10-CM | POA: Diagnosis not present

## 2018-06-29 DIAGNOSIS — R05 Cough: Secondary | ICD-10-CM | POA: Insufficient documentation

## 2018-06-29 DIAGNOSIS — R079 Chest pain, unspecified: Secondary | ICD-10-CM | POA: Diagnosis not present

## 2018-06-29 HISTORY — DX: Fibromyalgia: M79.7

## 2018-06-29 HISTORY — DX: Essential (primary) hypertension: I10

## 2018-06-29 HISTORY — DX: Disorder of brain, unspecified: G93.9

## 2018-06-29 HISTORY — DX: Other chest pain: R07.89

## 2018-06-29 HISTORY — DX: Personal history of other diseases of the circulatory system: Z86.79

## 2018-06-29 HISTORY — DX: Disorder of thyroid, unspecified: E07.9

## 2018-06-29 HISTORY — DX: Migraine with aura, not intractable, without status migrainosus: G43.109

## 2018-06-29 HISTORY — DX: Other chronic pain: G89.29

## 2018-06-29 LAB — BASIC METABOLIC PANEL
Anion gap: 7 (ref 5–15)
BUN: 15 mg/dL (ref 6–20)
CO2: 27 mmol/L (ref 22–32)
CREATININE: 0.84 mg/dL (ref 0.44–1.00)
Calcium: 9.5 mg/dL (ref 8.9–10.3)
Chloride: 102 mmol/L (ref 98–111)
GFR calc non Af Amer: >60 mL/min (ref >60)
Glucose, Bld: 105 mg/dL — ABNORMAL HIGH (ref 70–99)
Potassium: 4 mmol/L (ref 3.5–5.1)
Sodium: 136 mmol/L (ref 135–145)

## 2018-06-29 LAB — CBC
HCT: 41.9 % (ref 36.0–46.0)
Hemoglobin: 14 g/dL (ref 12.0–15.0)
MCH: 29.2 pg (ref 26.0–34.0)
MCHC: 33.4 g/dL (ref 30.0–36.0)
MCV: 87.5 fL (ref 80.0–100.0)
NRBC: 0 % (ref 0.0–0.2)
PLATELETS: 316 10*3/uL (ref 150–400)
RBC: 4.79 MIL/uL (ref 3.87–5.11)
RDW: 13.6 % (ref 11.5–15.5)
WBC: 7.6 10*3/uL (ref 4.0–10.5)

## 2018-06-29 LAB — TROPONIN I: Troponin I: <0.03 ng/mL (ref <0.03)

## 2018-06-29 MED ORDER — ACETAMINOPHEN 500 MG PO TABS
1000.0000 mg | ORAL_TABLET | Freq: Once | ORAL | Status: AC
  Administered 2018-06-29: 1000 mg via ORAL
  Filled 2018-06-29: qty 2

## 2018-06-29 MED ORDER — MORPHINE SULFATE (PF) 2 MG/ML IV SOLN: 2.0000 mg | Freq: Once | INTRAVENOUS | Status: DC

## 2018-06-29 NOTE — Discharge Instructions (Signed)
Take 4 over the counter ibuprofen tablets 3 times a day or 2 over-the-counter naproxen tablets twice a day for pain. Also take tylenol 1000mg(2 extra strength) four times a day.    

## 2018-06-29 NOTE — ED Triage Notes (Signed)
Pt reports mid- LT CP, describes as sore, since Friday; sts pain worse today, had diaphoresis earlier

## 2018-06-29 NOTE — ED Notes (Addendum)
Morphine held d/t no ride for pt brought self,  Pt states she doesn't want to stay for 2300 delta trop, MD made aware

## 2018-06-29 NOTE — ED Provider Notes (Signed)
[facility] EMERGENCY DEPARTMENT Provider Note   CSN: [id number] Arrival date & time: 06/29/18  1945     History   Chief Complaint Chief Complaint  Patient presents with  . Chest Pain    HPI [name] is a 52 y.o. female.  52 yo F with a chief complaint of chest pain.  Going on for the past 4 days.  Left-sided feels like a pressure.  Will get gotten worse over the past day.  Having some diaphoresis nausea with it.  Worse with sitting in a certain position and lying on left side.  Denies trauma is at a very mild cough.  Denies hemoptysis denies unilateral lower extremity edema denies recent surgery denies recent hospitalization denies estrogen use.  Denies history of PE or DVT.  Denies prior MI.  Denies hyperlipidemia diabetes or smoking.  Has a history of hypertension.  Mom had heart attack in her 44s.  The history is provided by the patient.  Chest Pain   This is a new problem. The current episode started yesterday. The problem occurs constantly. The problem has not changed since onset.The pain is present in the lateral region. The pain is at a severity of 10/10. The pain is severe. The quality of the pain is described as brief and sharp. The pain radiates to the left shoulder. Duration of episode(s) is 2 days. Associated symptoms include shortness of breath and weakness. Pertinent negatives include no dizziness, no fever, no headaches, no nausea, no palpitations and no vomiting. She has tried nothing for the symptoms. The treatment provided no relief.  Her past medical history is significant for hypertension.  Pertinent negatives for past medical history include no diabetes, no DVT, no hyperlipidemia, no MI and no PE.  Her family medical history is significant for early MI (91 mother).    Past Medical History:  Diagnosis Date  . Atypical chest pain   . Chronic pain   . Complicated migraine   . Depression   . Fibromyalgia   . History of angina   . Hypertension    . Lesion of frontal lobe of brain   . Sarcoid   . Thyroid disease     There are no active problems to display for this patient.   Past Surgical History:  Procedure Laterality Date  . BACK SURGERY    . NECK SURGERY    . SPINAL CORD STIMULATOR IMPLANT       OB History   None      Home Medications    Prior to Admission medications   Medication Sig Start Date End Date Taking? Authorizing Provider  albuterol (PROVENTIL HFA;VENTOLIN HFA) 108 (90 Base) MCG/ACT inhaler Inhale into the lungs every 6 (six) hours as needed for wheezing or shortness of breath.   Yes [provider]  buPROPion (WELLBUTRIN XL) 300 MG 24 hr tablet Take 300 mg by mouth daily.   Yes [provider]  HYDROmorphone (DILAUDID) 8 MG tablet Take 16 mg by mouth daily.   Yes [provider]  levothyroxine (SYNTHROID, LEVOTHROID) 137 MCG tablet Take 137 mcg by mouth daily before breakfast.   Yes [provider]  naproxen (NAPROSYN) 500 MG tablet Take 500 mg by mouth 2 (two) times daily with a meal.   Yes [provider]  omeprazole (PRILOSEC) 40 MG capsule Take 40 mg by mouth daily.   Yes [provider]  ondansetron (ZOFRAN) 4 MG tablet Take 4 mg by mouth every 8 (eight) hours  as needed for nausea or vomiting.   Yes [provider]  PARoxetine (PAXIL) 40 MG tablet Take 40 mg by mouth every morning.   Yes [provider]  propranolol (INDERAL) 40 MG tablet Take 40 mg by mouth 3 (three) times daily.   Yes [provider]  rOPINIRole (REQUIP) 2 MG tablet Take 2 mg by mouth at bedtime.   Yes [provider]  traMADol (ULTRAM) 50 MG tablet Take by mouth every 6 (six) hours as needed.   Yes [provider]  valACYclovir (VALTREX) 500 MG tablet Take 500 mg by mouth 2 (two) times daily.   Yes [provider]    Family History No family history on file.  Social History Social History   Tobacco Use  . Smoking  status: Former Research scientist (life sciences)  . Smokeless tobacco: Never Used  Substance Use Topics  . Alcohol use: Not Currently    Frequency: Never  . Drug use: Never     Allergies   Bactrim [sulfamethoxazole-trimethoprim]; Humira [adalimumab]; Lyrica [pregabalin]; Percocet [oxycodone-acetaminophen]; and Vicodin [hydrocodone-acetaminophen]   Review of Systems Review of Systems  Constitutional: Negative for chills and fever.  HENT: Negative for congestion and rhinorrhea.   Eyes: Negative for redness and visual disturbance.  Respiratory: Positive for shortness of breath. Negative for wheezing.   Cardiovascular: Positive for chest pain. Negative for palpitations.  Gastrointestinal: Negative for nausea and vomiting.  Genitourinary: Negative for dysuria and urgency.  Musculoskeletal: Negative for arthralgias and myalgias.  Skin: Negative for pallor and wound.  Neurological: Positive for weakness. Negative for dizziness and headaches.     Physical Exam Updated Vital Signs BP (!) 146/87   Pulse 77   Temp 98.4 F (36.9 C) (Oral)   Resp 18   Ht 5' 6.5" (1.689 m)   Wt 77.1 kg   SpO2 97%   BMI 27.03 kg/m   Physical Exam  Constitutional: She is oriented to person, place, and time. She appears well-developed and well-nourished. No distress.  HENT:  Head: Normocephalic and atraumatic.  Eyes: Pupils are equal, round, and reactive to light. EOM are normal.  Neck: Normal range of motion. Neck supple.  Cardiovascular: Normal rate and regular rhythm. Exam reveals no gallop and no friction rub.  No murmur heard. Pulmonary/Chest: Effort normal. She has no wheezes. She has no rales. She exhibits tenderness.  Left-sided chest wall tenderness reproduces the patient's pain  Abdominal: Soft. She exhibits no distension and no mass. There is no tenderness. There is no guarding.  Musculoskeletal: She exhibits no edema or tenderness.  Neurological: She is alert and oriented to person, place, and time.  Skin: Skin  is warm and dry. She is not diaphoretic.  Psychiatric: She has a normal mood and affect. Her behavior is normal.  Nursing note and vitals reviewed.    ED Treatments / Results  Labs (all labs ordered are listed, but only abnormal results are displayed) Labs Reviewed  BASIC METABOLIC PANEL - Abnormal; Notable for the following components:      Result Value   Glucose, Bld 105 (*)    All other components within normal limits  CBC  TROPONIN I  TROPONIN I    EKG EKG Interpretation  Date/Time:  Wednesday June 29 2018 19:55:24 EDT Ventricular Rate:  75 PR Interval:  134 QRS Duration: 92 QT Interval:  384 QTC Calculation: 428 R Axis:   62 Text Interpretation:  Normal sinus rhythm Normal ECG No old tracing to compare Confirmed by Deno Etienne [phone])  on 06/29/2018 8:31:35 PM   Radiology Dg Chest 2 View  Result Date: 06/29/2018 CLINICAL DATA:  Chest pain. EXAM: CHEST - 2 VIEW COMPARISON:  None. FINDINGS: The heart size and mediastinal contours are within normal limits. Both lungs are clear. No pneumothorax or pleural effusion is noted. The visualized skeletal structures are unremarkable. IMPRESSION: No active cardiopulmonary disease. Electronically Signed   By: Marijo Conception, M.D.   On: 06/29/2018 20:44    Procedures Procedures (including critical care time)  Medications Ordered in ED Medications  morphine 2 MG/ML injection 2 mg (0 mg Intravenous Hold 06/29/18 2134)  acetaminophen (TYLENOL) tablet 1,000 mg (1,000 mg Oral Given 06/29/18 2130)     Initial Impression / Assessment and Plan / ED Course  I have reviewed the triage vital signs and the nursing notes.  Pertinent labs & imaging results that were available during my care of the patient were reviewed by me and considered in my medical decision making (see chart for details).     52 yo F with atypical chest pain.  Most likely this is chest wall pain based on exam.  Initial troponin is negative EKG is unremarkable.   Will obtain a delta.  The patient was good with the plan and then when the nurse asked her how she was getting home she said she was going to drive herself.  At that point she was told that it would be responsible to give her narcotic pain medication if she was driving home.  The patient then decided that she would rather not wait for a second troponin would like to leave now.  Her pain is most likely musculoskeletal I feel her risk of MI is very low.  We will have her follow-up with her family physician.  9:38 PM:  I have discussed the diagnosis/risks/treatment options with the patient and believe the pt to be eligible for discharge home to follow-up with PCP. We also discussed returning to the ED immediately if new or worsening sx occur. We discussed the sx which are most concerning (e.g., sudden worsening pain, fever, inability to tolerate by mouth) that necessitate immediate return. Medications administered to the patient during their visit and any new prescriptions provided to the patient are listed below.  Medications given during this visit Medications  morphine 2 MG/ML injection 2 mg (0 mg Intravenous Hold 06/29/18 2134)  acetaminophen (TYLENOL) tablet 1,000 mg (1,000 mg Oral Given 06/29/18 2130)     The patient appears reasonably screen and/or stabilized for discharge and I doubt any other medical condition or other [facility] requiring further screening, evaluation, or treatment in the ED at this time prior to discharge.    Final Clinical Impressions(s) / ED Diagnoses   Final diagnoses:  Atypical chest pain    ED Discharge Orders    None       Deno Etienne, DO 06/29/18 2139

## 2018-06-30 ENCOUNTER — Encounter (HOSPITAL_COMMUNITY): Payer: Self-pay | Admitting: *Deleted

## 2018-07-18 DIAGNOSIS — M150 Primary generalized (osteo)arthritis: Secondary | ICD-10-CM | POA: Diagnosis not present

## 2018-07-18 DIAGNOSIS — R5383 Other fatigue: Secondary | ICD-10-CM | POA: Diagnosis not present

## 2018-07-18 DIAGNOSIS — Z1589 Genetic susceptibility to other disease: Secondary | ICD-10-CM | POA: Diagnosis not present

## 2018-07-18 DIAGNOSIS — M2550 Pain in unspecified joint: Secondary | ICD-10-CM | POA: Diagnosis not present

## 2018-07-18 DIAGNOSIS — D8689 Sarcoidosis of other sites: Secondary | ICD-10-CM | POA: Diagnosis not present

## 2018-07-27 DIAGNOSIS — M459 Ankylosing spondylitis of unspecified sites in spine: Secondary | ICD-10-CM | POA: Diagnosis not present

## 2018-08-11 DIAGNOSIS — D8689 Sarcoidosis of other sites: Secondary | ICD-10-CM | POA: Diagnosis not present

## 2018-08-11 DIAGNOSIS — M7712 Lateral epicondylitis, left elbow: Secondary | ICD-10-CM | POA: Diagnosis not present

## 2018-08-26 DIAGNOSIS — M459 Ankylosing spondylitis of unspecified sites in spine: Secondary | ICD-10-CM | POA: Diagnosis not present

## 2018-09-04 DIAGNOSIS — M7712 Lateral epicondylitis, left elbow: Secondary | ICD-10-CM | POA: Diagnosis not present

## 2018-09-04 DIAGNOSIS — M542 Cervicalgia: Secondary | ICD-10-CM | POA: Diagnosis not present

## 2018-09-04 DIAGNOSIS — M7582 Other shoulder lesions, left shoulder: Secondary | ICD-10-CM | POA: Diagnosis not present

## 2018-09-05 DIAGNOSIS — M47816 Spondylosis without myelopathy or radiculopathy, lumbar region: Secondary | ICD-10-CM | POA: Diagnosis not present

## 2018-09-05 DIAGNOSIS — Z9689 Presence of other specified functional implants: Secondary | ICD-10-CM | POA: Diagnosis not present

## 2018-09-05 DIAGNOSIS — M455 Ankylosing spondylitis of thoracolumbar region: Secondary | ICD-10-CM | POA: Diagnosis not present

## 2018-09-05 DIAGNOSIS — M961 Postlaminectomy syndrome, not elsewhere classified: Secondary | ICD-10-CM | POA: Diagnosis not present

## 2018-09-20 ENCOUNTER — Emergency Department (HOSPITAL_COMMUNITY): Payer: BLUE CROSS/BLUE SHIELD

## 2018-09-20 ENCOUNTER — Emergency Department (HOSPITAL_BASED_OUTPATIENT_CLINIC_OR_DEPARTMENT_OTHER): Payer: BLUE CROSS/BLUE SHIELD

## 2018-09-20 ENCOUNTER — Other Ambulatory Visit: Payer: Self-pay

## 2018-09-20 ENCOUNTER — Emergency Department (HOSPITAL_COMMUNITY)
Admission: EM | Admit: 2018-09-20 | Discharge: 2018-09-20 | Disposition: A | Discharge status: Home / Self Care | Payer: BLUE CROSS/BLUE SHIELD | Attending: Emergency Medicine | Admitting: Emergency Medicine

## 2018-09-20 ENCOUNTER — Encounter (HOSPITAL_COMMUNITY): Payer: Self-pay

## 2018-09-20 DIAGNOSIS — R609 Edema, unspecified: Secondary | ICD-10-CM

## 2018-09-20 DIAGNOSIS — R079 Chest pain, unspecified: Secondary | ICD-10-CM

## 2018-09-20 DIAGNOSIS — E039 Hypothyroidism, unspecified: Secondary | ICD-10-CM | POA: Diagnosis not present

## 2018-09-20 DIAGNOSIS — Z79899 Other long term (current) drug therapy: Secondary | ICD-10-CM | POA: Insufficient documentation

## 2018-09-20 DIAGNOSIS — R0789 Other chest pain: Secondary | ICD-10-CM | POA: Insufficient documentation

## 2018-09-20 DIAGNOSIS — R52 Pain, unspecified: Secondary | ICD-10-CM

## 2018-09-20 DIAGNOSIS — I10 Essential (primary) hypertension: Secondary | ICD-10-CM | POA: Diagnosis not present

## 2018-09-20 DIAGNOSIS — R0602 Shortness of breath: Secondary | ICD-10-CM

## 2018-09-20 DIAGNOSIS — Z87891 Personal history of nicotine dependence: Secondary | ICD-10-CM | POA: Diagnosis not present

## 2018-09-20 DIAGNOSIS — J45909 Unspecified asthma, uncomplicated: Secondary | ICD-10-CM | POA: Diagnosis not present

## 2018-09-20 DIAGNOSIS — M7989 Other specified soft tissue disorders: Secondary | ICD-10-CM

## 2018-09-20 LAB — D-DIMER, QUANTITATIVE (NOT AT ARMC): D DIMER QUANT: 0.38 ug{FEU}/mL (ref 0.00–0.50)

## 2018-09-20 LAB — TROPONIN I: Troponin I: <0.03 ng/mL (ref <0.03)

## 2018-09-20 LAB — BASIC METABOLIC PANEL
Anion gap: 8 (ref 5–15)
BUN: 22 mg/dL — AB (ref 6–20)
CHLORIDE: 107 mmol/L (ref 98–111)
CO2: 24 mmol/L (ref 22–32)
CREATININE: 0.89 mg/dL (ref 0.44–1.00)
Calcium: 9.6 mg/dL (ref 8.9–10.3)
GFR calc Af Amer: >60 mL/min (ref >60)
GFR calc non Af Amer: >60 mL/min (ref >60)
Glucose, Bld: 131 mg/dL — ABNORMAL HIGH (ref 70–99)
Potassium: 4.5 mmol/L (ref 3.5–5.1)
Sodium: 139 mmol/L (ref 135–145)

## 2018-09-20 LAB — CBC
HEMATOCRIT: 43 % (ref 36.0–46.0)
Hemoglobin: 14.2 g/dL (ref 12.0–15.0)
MCH: 28.5 pg (ref 26.0–34.0)
MCHC: 33 g/dL (ref 30.0–36.0)
MCV: 86.2 fL (ref 80.0–100.0)
NRBC: 0 % (ref 0.0–0.2)
Platelets: 340 10*3/uL (ref 150–400)
RBC: 4.99 MIL/uL (ref 3.87–5.11)
RDW: 13.7 % (ref 11.5–15.5)
WBC: 12.5 10*3/uL — ABNORMAL HIGH (ref 4.0–10.5)

## 2018-09-20 LAB — POCT I-STAT TROPONIN I: Troponin i, poc: 0 ng/mL (ref 0.00–0.08)

## 2018-09-20 LAB — I-STAT BETA HCG BLOOD, ED (NOT ORDERABLE): I-stat hCG, quantitative: <5 m[IU]/mL (ref <5)

## 2018-09-20 MED ORDER — SODIUM CHLORIDE 0.9% FLUSH
3.0000 mL | Freq: Once | INTRAVENOUS | Status: AC
  Administered 2018-09-20: 3 mL via INTRAVENOUS

## 2018-09-20 MED ORDER — SODIUM CHLORIDE 0.9 % IV BOLUS
500.0000 mL | Freq: Once | INTRAVENOUS | Status: AC
  Administered 2018-09-20: 500 mL via INTRAVENOUS

## 2018-09-20 MED ORDER — LORAZEPAM 2 MG/ML IJ SOLN
1.0000 mg | Freq: Once | INTRAMUSCULAR | Status: AC
  Administered 2018-09-20: 1 mg via INTRAVENOUS
  Filled 2018-09-20: qty 1

## 2018-09-20 MED ORDER — FENTANYL CITRATE (PF) 100 MCG/2ML IJ SOLN: 50.0000 ug | Freq: Once | INTRAMUSCULAR | Status: DC

## 2018-09-20 MED ORDER — FENTANYL CITRATE (PF) 100 MCG/2ML IJ SOLN
25.0000 ug | Freq: Once | INTRAMUSCULAR | Status: AC
  Administered 2018-09-20: 25 ug via INTRAVENOUS
  Filled 2018-09-20: qty 2

## 2018-09-20 MED ORDER — FENTANYL CITRATE (PF) 100 MCG/2ML IJ SOLN
50.0000 ug | Freq: Once | INTRAMUSCULAR | Status: AC
  Administered 2018-09-20: 50 ug via INTRAVENOUS
  Filled 2018-09-20: qty 2

## 2018-09-20 MED ORDER — HYDROMORPHONE HCL 1 MG/ML IJ SOLN
1.0000 mg | Freq: Once | INTRAMUSCULAR | Status: AC
  Administered 2018-09-20: 1 mg via INTRAVENOUS
  Filled 2018-09-20: qty 1

## 2018-09-20 NOTE — ED Notes (Signed)
Bed: TU88 Expected date:  Expected time:  Means of arrival:  Comments: Triage 3

## 2018-09-20 NOTE — ED Triage Notes (Signed)
Patient c/o left chest pain that radiates into the left arm and bilateral rib cage pain x 3 days. Patient states she has been asleep for the past 5 days and her heart is beating 120/min Patient also c/o slight SOB.  Attempted to do EKG and patient has a spinal cord stimulator on and does not have the remote. Her husband went to get the remote so an EKG could be done. HR-98.

## 2018-09-20 NOTE — Discharge Instructions (Addendum)
You have been diagnosed today with atypical chest pain and chest wall pain. At this time there does not appear to be the presence of an emergent medical condition, however there is always the potential for conditions to change. Please read and follow the below instructions.  Please return to the Emergency Department immediately for any new or worsening symptoms. Please be sure to follow up with your Primary Care Provider this week regarding your visit today; please call their office to schedule an appointment even if you are feeling better for a follow-up visit.  Get help right away if: Your chest pain gets worse. You have a cough that gets worse, or you cough up blood. You have severe pain in your abdomen. You faint. You have sudden, unexplained chest discomfort. You have sudden, unexplained discomfort in your arms, back, neck, or jaw. You have shortness of breath at any time. You suddenly start to sweat, or your skin gets clammy. You feel nausea or you vomit. You suddenly feel lightheaded or dizzy. You have severe weakness, or unexplained weakness or fatigue. Your heart begins to beat quickly, or it feels like it is skipping beats. Get help right away if: You have nausea or vomiting. You feel sweaty or light-headed. You have a cough with mucus from your lungs (sputum) or you cough up blood. You develop shortness of breath.  Please read the additional information packets attached to your discharge summary.

## 2018-09-20 NOTE — ED Provider Notes (Addendum)
[facility] DEPT Provider Note   CSN: [id number] Arrival date & time: 09/20/18  1228     History   Chief Complaint Chief Complaint  Patient presents with  . Chest Pain  . bilateral rib pain  . Shortness of Breath    HPI [name] is a 53 y.o. female with history of sarcoidosis presenting today for chest pain.  Patient reports that 3 days ago she developed a left-sided chest pain radiating to the left arm that has been constant since time of onset.  Additionally patient reports that yesterday she developed bilateral rib pain.  Patient states that she has all of these pains at the same time, describes them as severe, constant, sharp/throbbing without alleviating factors.  Patient states that exertion and walking slightly exacerbates her pain.  Patient denies recent fever, cough, immobilization/surgery, hemoptysis.  Patient endorses mild shortness of breath that has been intermittent since time of onset.  Patient also endorses nausea without vomiting.  Of note patient endorses mild left calf tenderness to palpation that has been present for the past 3-4 days.  She denies swelling or color change of her extremities.  She denies injury or trauma to this area.  Additionally patient with history of chronic pain, has been using tramadol with minimal relief of pain over the past 3 days.  Patient states that her pain medication was recently decreased by her other provider which she believes is contributing to her pain today.  HPI  Past Medical History:  Diagnosis Date  . Ankylosing spondylitis ([place])   . Anxiety   . Arthritis    "back, hands, neck" (09/18/2014)  . Asthma   . Atypical chest pain   . Chronic back pain    "whole spine"  . Chronic bronchitis ([place])    "get it close to q yr" (09/18/2014)  . Chronic pain   . Complicated migraine   . Depression   . Environmental allergies   . Erosion of suburethral sling ([place])   . Fibromyalgia   .  Frequency of urination   . GERD (gastroesophageal reflux disease)   . Headache    "at least several times/wk" (09/18/2014)  . Heart murmur    "when I was a baby"  . Herpes simplex type 2 infection   . History of angina   . HLA B27 positive   . Hypertension   . Hypothyroidism   . IBS (irritable bowel syndrome)   . IC (interstitial cystitis)   . Lesion of frontal lobe of brain   . Migraine    "maybe once/month" (09/18/2014)  . Nocturia   . NSVD (normal spontaneous vaginal delivery)    x3  . Osteoarthritis   . Pneumonia   . RLS (restless legs syndrome)   . Sarcoid   . Seizures ([place])    "when I was a teenager"  . Shortness of breath dyspnea    Exertion  . SUI (stress urinary incontinence, female)   . Thyroid disease     Patient Active Problem List   Diagnosis Date Noted  . Complicated migraine 09/62/8366  . Class 1 obesity due to excess calories with body mass index (BMI) of 32.0 to 32.9 in adult   . Neurological symptoms 03/11/2017  . Steroid-induced hyperglycemia 03/11/2017  . Depression 03/11/2017  . Essential hypertension 03/11/2017  . Hypothyroidism 03/11/2017  . Chronic pain 03/11/2017  . Palpitations 11/04/2016  . Near syncope 11/04/2016  . Dyspnea 11/04/2016  . HA (headache) 11/14/2014  . Sarcoid  11/12/2014  . Left-sided weakness   . Left hemiparesis ([place]) 09/18/2014  . Weakness 09/18/2014  . Frontal skull lesion 09/18/2014  . Atypical chest pain 09/18/2014  . Extrinsic asthma 04/25/2013  . Weight gain 01/12/2013  . Erosion of graft 11/16/2012    Past Surgical History:  Procedure Laterality Date  . ABDOMINAL HYSTERECTOMY  2006  . ANTERIOR CERVICAL DECOMP/DISCECTOMY FUSION  2011  . APPENDECTOMY  1985  . ARTHROPLASTY     Left thumb  . BACK SURGERY  2008   X 2 in 2010  . BACK SURGERY    . CESAREAN SECTION  2000  . CRANIECTOMY FOR DEPRESSED SKULL FRACTURE Left 12/05/2014   Procedure: Left frontal stereotactic craniectomy for biopsy of skull lesion;   Surgeon: Consuella Lose, MD;  Location: [facility];  Service: Neurosurgery;  Laterality: Left;  Left frontal stereotactic craniectomy for biopsy of skull lesion  . CYSTO WITH HYDRODISTENSION N/A 11/30/2012   Procedure: CYSTOSCOPY/HYDRODISTENSION;  Surgeon: Bernestine Amass, MD;  Location: [facility];  Service: Urology;  Laterality: N/A;  . KNEE ARTHROSCOPY Right   . LUMBAR DISC SURGERY  02-02-2007   RIGHT SIDE L5 -- S1  . LUMBAR FUSION  12-04-2008   L4  -- S1  . LYNX RETROPUBIC SUBURETHRAL SLING  03-02-2007  . MASS EXCISION Left 03/19/2017   Procedure: EXCISION LEFT WRIST VOLAR MASS;  Surgeon: Charlotte Crumb, MD;  Location: [facility];  Service: Orthopedics;  Laterality: Left;  Marland Kitchen MEDIASTINOSCOPY N/A 10/26/2014   Procedure: MEDIASTINOSCOPY;  Surgeon: Melrose Nakayama, MD;  Location: [facility];  Service: Thoracic;  Laterality: N/A;  . NECK SURGERY    . PELVIC LAPAROSCOPY  1990's   LYSIS ADHESIONS  . PUBOVAGINAL SLING N/A 11/30/2012   Procedure: Gaynelle Arabian;  Surgeon: Bernestine Amass, MD;  Location[facility];  Service: Urology;  Laterality: N/A;  1 HR EXAM UNDER ANESTHESIA, EXCISION OF SUB URETHRAL MESH, CYSTO, HOD   . SPINAL CORD STIMULATOR IMPLANT    . TONSILLECTOMY  1986  . TUBAL LIGATION  2001   hulka clip  . VIDEO BRONCHOSCOPY WITH ENDOBRONCHIAL ULTRASOUND N/A 09/21/2014   Procedure: VIDEO BRONCHOSCOPY WITH ENDOBRONCHIAL ULTRASOUND;  Surgeon: Collene Gobble, MD;  Location: [facility];  Service: Thoracic;  Laterality: N/A;     OB History    Gravida  4   Para  4   Term  0   Preterm  0   AB  0   Living        SAB  0   TAB  0   Ectopic  0   Multiple      Live Births               Home Medications    Prior to Admission medications   Medication Sig Start Date End Date Taking? Authorizing Provider  BELSOMRA 15 MG TABS Take 15 mg by mouth at bedtime as needed for sleep. 09/02/18  Yes [provider]  buPROPion (WELLBUTRIN  XL) 300 MG 24 hr tablet Take 300 mg by mouth daily.   Yes [provider]  diphenhydrAMINE HCl (ZZZQUIL) 50 MG/30ML LIQD Take 30 mLs by mouth at bedtime.   Yes [provider]  levothyroxine (SYNTHROID, LEVOTHROID) 137 MCG tablet Take 137 mcg by mouth daily before breakfast.   Yes [provider]  naloxone (NARCAN) 2 MG/2ML injection 0.4 mg once.  09/05/18  Yes [provider]  naproxen (NAPROSYN) 500 MG tablet Take 500  mg by mouth 2 (two) times daily with a meal.   Yes [provider]  omeprazole (PRILOSEC) 40 MG capsule Take 20 mg by mouth at bedtime.    Yes [provider]  ondansetron (ZOFRAN) 4 MG tablet Take 4 mg by mouth every 8 (eight) hours as needed for nausea or vomiting.   Yes [provider]  PARoxetine (PAXIL) 40 MG tablet Take 40 mg by mouth at bedtime. 11/29/15  Yes [provider]  propranolol (INDERAL) 40 MG tablet Take 40 mg by mouth 2 (two) times daily. 12/01/16  Yes [provider]  rOPINIRole (REQUIP) 2 MG tablet Take 2 mg by mouth at bedtime.   Yes [provider]  traMADol (ULTRAM) 50 MG tablet Take 50 mg by mouth every 6 (six) hours as needed for moderate pain.    Yes [provider]  valACYclovir (VALTREX) 500 MG tablet Take 500 mg by mouth 2 (two) times daily.   Yes [provider]  albuterol (PROVENTIL HFA;VENTOLIN HFA) 108 (90 BASE) MCG/ACT inhaler Inhale 2 puffs into the lungs every 6 (six) hours as needed. For shortness of breath Patient not taking: Reported on 09/20/2018 04/25/13   Elayne Snare, MD  amoxicillin-clavulanate (AUGMENTIN) 875-125 MG tablet Take 1 tablet by mouth 2 (two) times daily. Patient not taking: Reported on 09/20/2018 12/28/17   Chesley Mires, MD  fluticasone furoate-vilanterol (BREO ELLIPTA) 100-25 MCG/INH AEPB Inhale 1 puff into the lungs daily. Patient not taking: Reported on 09/20/2018 04/27/18   Chesley Mires, MD  ondansetron (ZOFRAN) 4 MG tablet  Take 1 tablet (4 mg total) by mouth every 8 (eight) hours as needed for nausea or vomiting. Patient not taking: Reported on 09/20/2018 01/29/17   Seabron Spates, CNM  predniSONE (DELTASONE) 10 MG tablet Take q day 4,4,4,3,3,3,2,2,2,1,1,1 Patient not taking: Reported on 09/20/2018 03/18/17   Daleen Bo, MD  traMADol (ULTRAM) 50 MG tablet Take 1 tablet (50 mg total) by mouth every 6 (six) hours as needed. Patient not taking: Reported on 09/20/2018 01/08/15   Sidney Ace    Family History Family History  Problem Relation Age of Onset  . Diabetes Mother   . Hypertension Mother   . Heart disease Mother   . Cancer Father        LUNG AND BRAIN  . Hypertension Father   . Heart disease Maternal Grandmother   . Cancer Maternal Grandfather        LUNG  . Heart disease Paternal Grandmother   . Cancer Paternal Grandfather     Social History Social History   Tobacco Use  . Smoking status: Former Smoker    Packs/day: 0.50    Years: 15.00    Pack years: 7.50    Types: Cigarettes    Last attempt to quit: 01/15/2014    Years since quitting: 4.6  . Smokeless tobacco: Never Used  Substance Use Topics  . Alcohol use: Not Currently    Frequency: Never    Comment: few glasses a week  . Drug use: Never    Comment: marijuana medicinal patches on her back - 03/18/17 states she no longer uses this     Allergies   Lyrica [pregabalin]; Adalimumab; Bactrim [sulfamethoxazole-trimethoprim]; Humira [adalimumab]; Lyrica [pregabalin]; Percocet [oxycodone-acetaminophen]; Topiramate; Vicodin [hydrocodone-acetaminophen]; Hydrocodone-acetaminophen; Oxycodone-acetaminophen; Percocet [oxycodone-acetaminophen]; Sulfamethoxazole-trimethoprim; and Vicodin [hydrocodone-acetaminophen]   Review of Systems Review of Systems  Constitutional: Negative.  Negative for chills and fever.  Respiratory: Negative.  Negative for cough and shortness of breath (Resolved on  evaluation).   Cardiovascular: Positive  for chest pain. Negative for leg swelling.  Gastrointestinal: Negative.  Negative for abdominal pain, diarrhea, nausea and vomiting.  Musculoskeletal: Positive for myalgias (Left calf). Negative for arthralgias and joint swelling.  Neurological: Negative.  Negative for dizziness, weakness and headaches.  All other systems reviewed and are negative.  Physical Exam Updated Vital Signs BP 128/90   Pulse 87   Temp 98.4 F (36.9 C) (Oral)   Resp 19   Ht 5' 6.5" (1.689 m)   Wt 77.1 kg   LMP 06/03/2012   SpO2 94%   BMI 27.03 kg/m   Physical Exam Constitutional:      General: She is not in acute distress.    Appearance: She is well-developed.  HENT:     Head: Normocephalic and atraumatic.     Right Ear: External ear normal.     Left Ear: External ear normal.     Nose: Nose normal.  Eyes:     Pupils: Pupils are equal, round, and reactive to light.  Neck:     Musculoskeletal: Normal range of motion.     Trachea: Trachea normal. No tracheal deviation.  Cardiovascular:     Rate and Rhythm: Normal rate and regular rhythm.     Pulses:          Radial pulses are 2+ on the right side and 2+ on the left side.       Dorsalis pedis pulses are 2+ on the right side and 2+ on the left side.       Posterior tibial pulses are 2+ on the right side and 2+ on the left side.     Heart sounds: Normal heart sounds.  Pulmonary:     Effort: Pulmonary effort is normal. No respiratory distress.     Breath sounds: Normal breath sounds. No decreased breath sounds or rhonchi.  Chest:     Chest wall: Tenderness present. No deformity or crepitus.     Comments: Patient tender to light palpation of the chest. Abdominal:     General: There is no distension.     Palpations: Abdomen is soft.     Tenderness: There is no abdominal tenderness. There is no guarding or rebound.  Musculoskeletal: Normal range of motion.     Right lower leg: She exhibits no tenderness. No edema.     Left lower leg: She exhibits  tenderness. No edema.  Feet:     Right foot:     Protective Sensation: 3 sites tested. 3 sites sensed.     Left foot:     Protective Sensation: 3 sites tested. 3 sites sensed.  Skin:    General: Skin is warm and dry.  Neurological:     Mental Status: She is alert.     GCS: GCS eye subscore is 4. GCS verbal subscore is 5. GCS motor subscore is 6.     Comments: Speech is clear and goal oriented, follows commands Major Cranial nerves without deficit, no facial droop Moves extremities without ataxia, coordination intact  Psychiatric:        Behavior: Behavior normal.    ED Treatments / Results  Labs (all labs ordered are listed, but only abnormal results are displayed) Labs Reviewed  BASIC METABOLIC PANEL - Abnormal; Notable for the following components:      Result Value   Glucose, Bld 131 (*)    BUN 22 (*)    All other components within normal limits  CBC - Abnormal; Notable  for the following components:   WBC 12.5 (*)    All other components within normal limits  D-DIMER, QUANTITATIVE (NOT AT [facility])  TROPONIN I  I-STAT TROPONIN, ED  I-STAT BETA HCG BLOOD, ED ([facility], [facility], [facility] ONLY)  POCT I-STAT TROPONIN I  I-STAT BETA HCG BLOOD, ED (NOT ORDERABLE)    EKG EKG Interpretation  Date/Time:  Tuesday September 20 2018 13:49:22 EST Ventricular Rate:  84 PR Interval:    QRS Duration: 82 QT Interval:  364 QTC Calculation: 431 R Axis:   53 Text Interpretation:  Sinus rhythm RSR' in V1 or V2, probably normal variant No significant change since last tracing Confirmed by Wandra Arthurs [phone]) on 09/20/2018 2:09:02 PM   Radiology Dg Chest 2 View  Result Date: 09/20/2018 CLINICAL DATA:  Mid chest pain and tightness for 3 days, former smoker EXAM: CHEST - 2 VIEW COMPARISON:  12/01/2017 FINDINGS: Intraspinal stimulator present extending from T7 to superior T10. Normal heart size, mediastinal contours, and pulmonary vascularity. Lungs clear. No infiltrate, pleural effusion or pneumothorax.  Prior cervical spine fusion. IMPRESSION: No acute abnormalities. Electronically Signed   By: Lavonia Dana M.D.   On: 09/20/2018 13:15   Vas [place] Lower Extremity Venous (dvt) (only [facility])  Result Date: 09/20/2018  Lower Venous Study Indications: Swelling, Pain, Edema, SOB, and Chest pain.  Limitations: Body habitus. Comparison Study: No comparison study available Performing Technologist: Rudell Cobb  Examination Guidelines: A complete evaluation includes B-mode imaging, spectral Doppler, color Doppler, and power Doppler as needed of all accessible portions of each vessel. Bilateral testing is considered an integral part of a complete examination. Limited examinations for reoccurring indications may be performed as noted.  Right Venous Findings: +---------+---------------+---------+-----------+----------+-------+          CompressibilityPhasicitySpontaneityPropertiesSummary +---------+---------------+---------+-----------+----------+-------+ CFV      Full           Yes      Yes                          +---------+---------------+---------+-----------+----------+-------+ SFJ      Full                                                 +---------+---------------+---------+-----------+----------+-------+ FV Prox  Full                                                 +---------+---------------+---------+-----------+----------+-------+ FV Mid   Full                                                 +---------+---------------+---------+-----------+----------+-------+ FV DistalFull                                                 +---------+---------------+---------+-----------+----------+-------+ PFV      Full                                                 +---------+---------------+---------+-----------+----------+-------+  POP      Full           Yes      Yes                          +---------+---------------+---------+-----------+----------+-------+ PTV      Full                                                  +---------+---------------+---------+-----------+----------+-------+ PERO     Full                                                 +---------+---------------+---------+-----------+----------+-------+  Left Venous Findings: +---------+---------------+---------+-----------+----------+---------+          CompressibilityPhasicitySpontaneityPropertiesSummary   +---------+---------------+---------+-----------+----------+---------+ CFV      Full           Yes      Yes                            +---------+---------------+---------+-----------+----------+---------+ SFJ      Full                                                   +---------+---------------+---------+-----------+----------+---------+ FV Prox  Full                                                   +---------+---------------+---------+-----------+----------+---------+ FV Mid   Full                                                   +---------+---------------+---------+-----------+----------+---------+ FV DistalFull           Yes      Yes                  poor flow +---------+---------------+---------+-----------+----------+---------+ PFV      Full                                                   +---------+---------------+---------+-----------+----------+---------+ POP      Full           Yes      Yes                  poor flow +---------+---------------+---------+-----------+----------+---------+ PTV      Full                                                   +---------+---------------+---------+-----------+----------+---------+  PERO     Full                                                   +---------+---------------+---------+-----------+----------+---------+    Summary: Right: There is no evidence of deep vein thrombosis in the lower extremity. No cystic structure found in the popliteal fossa. Left: There is no evidence of deep vein  thrombosis in the lower extremity. No cystic structure found in the popliteal fossa.  *See table(s) above for measurements and observations.    Preliminary     Procedures Procedures (including critical care time)  Medications Ordered in ED Medications  sodium chloride flush (NS) 0.9 % injection 3 mL (3 mLs Intravenous Given 09/20/18 1503)  fentaNYL (SUBLIMAZE) injection 25 mcg (25 mcg Intravenous Given 09/20/18 1500)  sodium chloride 0.9 % bolus 500 mL (0 mLs Intravenous Stopped 09/20/18 1909)  fentaNYL (SUBLIMAZE) injection 50 mcg (50 mcg Intravenous Given 09/20/18 1616)  LORazepam (ATIVAN) injection 1 mg (1 mg Intravenous Given 09/20/18 1615)  HYDROmorphone (DILAUDID) injection 1 mg (1 mg Intravenous Given 09/20/18 1758)     Initial Impression / Assessment and Plan / ED Course  I have reviewed the triage vital signs and the nursing notes.  Pertinent labs & imaging results that were available during my care of the patient were reviewed by me and considered in my medical decision making (see chart for details).    53 year old female with history of sarcoidosis and chronic pain presenting today for chest pain x3 days, worsening since yesterday.  Mildly tender to left calf.  Minimal shortness of breath.  On initial evaluation patient with heart rate approximately 95 bpm, SPO2 on pulse oximeter is 97% on room air. Case discussed with Dr. Darl Householder, will proceed with cardiac workup, Ddimer and LE [place] for DVT. -------- Beta-hCG negative Initial troponin negative D-dimer negative BMP nonacute CBC nonacute Chest x-ray negative Preliminary results of bilateral lower extremity ultrasound negative for DVT Fluid bolus given Pain controlled Delta troponin negative ------------ Patient seen and evaluated by Dr. Stark Jock who agrees with discharge at this time. ------------- Patient is to be discharged with recommendation to follow up with PCP in regards to today's hospital visit.  Suspect musculoskeletal  etiology of patient's pain today.  Pain is consistently reproducible with palpation of the chest wall.  Chest pain is not likely of cardiac or pulmonary etiology d/t presentation, d-dimer negative, VSS, no tracheal deviation, no JVD or new murmur, RRR, breath sounds equal bilaterally, EKG without acute abnormalities, negative troponin x2, and negative CXR. Pt has been advised to return to the ED is CP becomes exertional, associated with diaphoresis or nausea, radiates to left jaw/arm, worsens or becomes concerning in any way. Pt appears reliable for follow up and is agreeable to discharge.   On reevaluation patient asked whether it is alright for her to take her home dose of tramadol for her chronic pain.  Patient states that she was recently told to stop taking her tramadol by her pain management provider. Patient states that her pain is now at baseline and states that she does not have any new pain at this time.  I advised the patient to follow-up with both her primary care provider as well as her pain management doctor regarding her chronic pain and that I am unable to prescribe additional pain medication for her chronic  pain today.  Patient and her husband state understanding and are agreeable with care plan of discharge and outpatient follow-up at this time.  At this time there does not appear to be any evidence of an acute emergency medical condition and the patient appears stable for discharge with appropriate outpatient follow up. Diagnosis was discussed with patient who verbalizes understanding of care plan and is agreeable to discharge. I have discussed return precautions with patient and husband who verbalize understanding of return precautions. Patient strongly encouraged to follow-up with their PCP and pain provider this week. All questions answered.  Patient's case rediscussed with Dr. Stark Jock who agrees with plan to discharge with follow-up.   Note: Portions of this report may have been  transcribed using voice recognition software. Every effort was made to ensure accuracy; however, inadvertent computerized transcription errors may still be present. Final Clinical Impressions(s) / ED Diagnoses   Final diagnoses:  Atypical chest pain  Chest wall pain    ED Discharge Orders    None       Deliah Boston, PA-C 09/20/18 1930    Gari Crown 09/20/18 1932    Veryl Speak, MD 09/20/18 2257

## 2018-09-20 NOTE — Progress Notes (Signed)
Bilateral lower extremities venous duplex exam completed. More details please see preliminary notes on CV PROC under chart review.  H (RDMS RVT) 09/20/18 3:29 PM

## 2018-09-23 DIAGNOSIS — R079 Chest pain, unspecified: Secondary | ICD-10-CM | POA: Diagnosis not present

## 2018-09-26 DIAGNOSIS — M459 Ankylosing spondylitis of unspecified sites in spine: Secondary | ICD-10-CM | POA: Diagnosis not present

## 2018-10-05 DIAGNOSIS — R7309 Other abnormal glucose: Secondary | ICD-10-CM | POA: Diagnosis not present

## 2018-10-05 DIAGNOSIS — D869 Sarcoidosis, unspecified: Secondary | ICD-10-CM | POA: Diagnosis not present

## 2018-10-05 DIAGNOSIS — R5382 Chronic fatigue, unspecified: Secondary | ICD-10-CM | POA: Diagnosis not present

## 2018-10-10 ENCOUNTER — Other Ambulatory Visit: Payer: Self-pay | Admitting: Family Medicine

## 2018-10-10 DIAGNOSIS — D869 Sarcoidosis, unspecified: Secondary | ICD-10-CM

## 2018-10-25 ENCOUNTER — Ambulatory Visit
Admission: RE | Admit: 2018-10-25 | Discharge: 2018-10-25 | Disposition: A | Discharge status: Home / Self Care | Payer: BLUE CROSS/BLUE SHIELD | Source: Ambulatory Visit | Attending: Family Medicine | Admitting: Family Medicine

## 2018-10-25 DIAGNOSIS — D869 Sarcoidosis, unspecified: Secondary | ICD-10-CM

## 2018-10-25 DIAGNOSIS — M4802 Spinal stenosis, cervical region: Secondary | ICD-10-CM | POA: Diagnosis not present

## 2018-10-25 DIAGNOSIS — M47812 Spondylosis without myelopathy or radiculopathy, cervical region: Secondary | ICD-10-CM | POA: Diagnosis not present

## 2018-10-27 DIAGNOSIS — M459 Ankylosing spondylitis of unspecified sites in spine: Secondary | ICD-10-CM | POA: Diagnosis not present

## 2018-11-11 DIAGNOSIS — R6889 Other general symptoms and signs: Secondary | ICD-10-CM | POA: Diagnosis not present

## 2018-11-11 DIAGNOSIS — J209 Acute bronchitis, unspecified: Secondary | ICD-10-CM | POA: Diagnosis not present

## 2018-11-17 DIAGNOSIS — R05 Cough: Secondary | ICD-10-CM | POA: Diagnosis not present

## 2018-11-25 DIAGNOSIS — G8929 Other chronic pain: Secondary | ICD-10-CM | POA: Diagnosis not present

## 2018-11-25 DIAGNOSIS — T8189XA Other complications of procedures, not elsewhere classified, initial encounter: Secondary | ICD-10-CM | POA: Diagnosis not present

## 2018-11-25 DIAGNOSIS — M459 Ankylosing spondylitis of unspecified sites in spine: Secondary | ICD-10-CM | POA: Diagnosis not present

## 2018-12-26 DIAGNOSIS — T8189XA Other complications of procedures, not elsewhere classified, initial encounter: Secondary | ICD-10-CM | POA: Diagnosis not present

## 2018-12-26 DIAGNOSIS — M459 Ankylosing spondylitis of unspecified sites in spine: Secondary | ICD-10-CM | POA: Diagnosis not present

## 2018-12-26 DIAGNOSIS — G8929 Other chronic pain: Secondary | ICD-10-CM | POA: Diagnosis not present

## 2018-12-28 DIAGNOSIS — R110 Nausea: Secondary | ICD-10-CM | POA: Diagnosis not present

## 2018-12-28 DIAGNOSIS — G43909 Migraine, unspecified, not intractable, without status migrainosus: Secondary | ICD-10-CM | POA: Diagnosis not present

## 2019-01-06 DIAGNOSIS — R5382 Chronic fatigue, unspecified: Secondary | ICD-10-CM | POA: Diagnosis not present

## 2019-01-06 DIAGNOSIS — D8989 Other specified disorders involving the immune mechanism, not elsewhere classified: Secondary | ICD-10-CM | POA: Diagnosis not present

## 2019-01-16 DIAGNOSIS — D8689 Sarcoidosis of other sites: Secondary | ICD-10-CM | POA: Diagnosis not present

## 2019-01-16 DIAGNOSIS — M150 Primary generalized (osteo)arthritis: Secondary | ICD-10-CM | POA: Diagnosis not present

## 2019-01-16 DIAGNOSIS — G894 Chronic pain syndrome: Secondary | ICD-10-CM | POA: Diagnosis not present

## 2019-01-16 DIAGNOSIS — M2550 Pain in unspecified joint: Secondary | ICD-10-CM | POA: Diagnosis not present

## 2019-01-18 DIAGNOSIS — M47816 Spondylosis without myelopathy or radiculopathy, lumbar region: Secondary | ICD-10-CM | POA: Diagnosis not present

## 2019-01-18 DIAGNOSIS — G43709 Chronic migraine without aura, not intractable, without status migrainosus: Secondary | ICD-10-CM | POA: Diagnosis not present

## 2019-01-18 DIAGNOSIS — M961 Postlaminectomy syndrome, not elsewhere classified: Secondary | ICD-10-CM | POA: Diagnosis not present

## 2019-01-18 DIAGNOSIS — Z5181 Encounter for therapeutic drug level monitoring: Secondary | ICD-10-CM | POA: Diagnosis not present

## 2019-01-18 DIAGNOSIS — Z9689 Presence of other specified functional implants: Secondary | ICD-10-CM | POA: Diagnosis not present

## 2019-01-18 DIAGNOSIS — Z79899 Other long term (current) drug therapy: Secondary | ICD-10-CM | POA: Diagnosis not present

## 2019-01-18 DIAGNOSIS — M455 Ankylosing spondylitis of thoracolumbar region: Secondary | ICD-10-CM | POA: Diagnosis not present

## 2019-03-07 DIAGNOSIS — M4722 Other spondylosis with radiculopathy, cervical region: Secondary | ICD-10-CM | POA: Diagnosis not present

## 2019-03-07 DIAGNOSIS — Z9682 Presence of neurostimulator: Secondary | ICD-10-CM | POA: Diagnosis not present

## 2019-03-07 DIAGNOSIS — Z981 Arthrodesis status: Secondary | ICD-10-CM | POA: Diagnosis not present

## 2019-03-21 DIAGNOSIS — Z20828 Contact with and (suspected) exposure to other viral communicable diseases: Secondary | ICD-10-CM | POA: Diagnosis not present

## 2019-04-05 ENCOUNTER — Telehealth: Payer: Self-pay | Admitting: Nurse Practitioner

## 2019-04-05 ENCOUNTER — Other Ambulatory Visit: Payer: Self-pay | Admitting: Physical Medicine and Rehabilitation

## 2019-04-05 DIAGNOSIS — M5412 Radiculopathy, cervical region: Secondary | ICD-10-CM

## 2019-04-05 NOTE — Telephone Encounter (Signed)
Phone call to patient to verify medication list and allergies for myelogram procedure. Pt instructed to hold Wellbutrin and Paxil for 48hrs prior to myelogram appointment time. Pt verbalized understanding. Pre and post procedure instructions reviewed with pt.

## 2019-04-14 ENCOUNTER — Other Ambulatory Visit: Payer: BLUE CROSS/BLUE SHIELD

## 2019-04-26 DIAGNOSIS — R197 Diarrhea, unspecified: Secondary | ICD-10-CM | POA: Diagnosis not present

## 2019-05-01 ENCOUNTER — Other Ambulatory Visit: Payer: Self-pay

## 2019-05-01 ENCOUNTER — Ambulatory Visit
Admission: RE | Admit: 2019-05-01 | Discharge: 2019-05-01 | Disposition: A | Discharge status: Home / Self Care | Payer: BLUE CROSS/BLUE SHIELD | Source: Ambulatory Visit | Attending: Physical Medicine and Rehabilitation | Admitting: Physical Medicine and Rehabilitation

## 2019-05-01 ENCOUNTER — Ambulatory Visit
Admission: RE | Admit: 2019-05-01 | Discharge: 2019-05-01 | Disposition: A | Discharge status: Home / Self Care | Payer: Medicare Other | Source: Ambulatory Visit | Attending: Physical Medicine and Rehabilitation | Admitting: Physical Medicine and Rehabilitation

## 2019-05-01 DIAGNOSIS — M5412 Radiculopathy, cervical region: Secondary | ICD-10-CM

## 2019-05-01 DIAGNOSIS — M9971 Connective tissue and disc stenosis of intervertebral foramina of cervical region: Secondary | ICD-10-CM | POA: Diagnosis not present

## 2019-05-01 MED ORDER — DIAZEPAM 5 MG PO TABS
10.0000 mg | ORAL_TABLET | Freq: Once | ORAL | Status: AC
  Administered 2019-05-01: 10 mg via ORAL

## 2019-05-01 MED ORDER — IOPAMIDOL (ISOVUE-M 300) INJECTION 61%
1.0000 mL | Freq: Once | INTRAMUSCULAR | Status: AC
  Administered 2019-05-01: 10 mL via INTRATHECAL

## 2019-05-01 NOTE — Progress Notes (Signed)
Patient states she has been off Paxil and Wellbutrin for at least the past two days.

## 2019-05-01 NOTE — Discharge Instructions (Signed)
Myelogram Discharge Instructions  1. Go home and rest quietly for the next 24 hours.  It is important to lie flat for the next 24 hours.  Get up only to go to the restroom.  You may lie in the bed or on a couch on your back, your stomach, your left side or your right side.  You may have one pillow under your head.  You may have pillows between your knees while you are on your side or under your knees while you are on your back.  2. DO NOT drive today.  Recline the seat as far back as it will go, while still wearing your seat belt, on the way home.  3. You may get up to go to the bathroom as needed.  You may sit up for 10 minutes to eat.  You may resume your normal diet and medications unless otherwise indicated.  Drink lots of extra fluids today and tomorrow.  4. The incidence of headache, nausea, or vomiting is about 5% (one in 20 patients).  If you develop a headache, lie flat and drink plenty of fluids until the headache goes away.  Caffeinated beverages may be helpful.  If you develop severe nausea and vomiting or a headache that does not go away with flat bed rest, call [phone].  5. You may resume normal activities after your 24 hours of bed rest is over; however, do not exert yourself strongly or do any heavy lifting tomorrow. If when you get up you have a headache when standing, go back to bed and force fluids for another 24 hours.  6. Call your physician for a follow-up appointment.  The results of your myelogram will be sent directly to your physician by the following day.  7. If you have any questions or if complications develop after you arrive home, please call [phone].  Discharge instructions have been explained to the patient.  The patient, or the person responsible for the patient, fully understands these instructions.  YOU MAY RESTART YOUR WELLBUTRIN AND PAXIL TOMORROW 05/02/2019 AT 10:30AM.

## 2019-05-04 DIAGNOSIS — R232 Flushing: Secondary | ICD-10-CM | POA: Diagnosis not present

## 2019-05-11 DIAGNOSIS — R232 Flushing: Secondary | ICD-10-CM | POA: Diagnosis not present

## 2019-05-29 DIAGNOSIS — Z9682 Presence of neurostimulator: Secondary | ICD-10-CM | POA: Diagnosis not present

## 2019-05-29 DIAGNOSIS — M47816 Spondylosis without myelopathy or radiculopathy, lumbar region: Secondary | ICD-10-CM | POA: Diagnosis not present

## 2019-05-29 DIAGNOSIS — M455 Ankylosing spondylitis of thoracolumbar region: Secondary | ICD-10-CM | POA: Diagnosis not present

## 2019-05-29 DIAGNOSIS — M961 Postlaminectomy syndrome, not elsewhere classified: Secondary | ICD-10-CM | POA: Diagnosis not present

## 2019-06-02 DIAGNOSIS — Z01818 Encounter for other preprocedural examination: Secondary | ICD-10-CM | POA: Diagnosis not present

## 2019-06-02 DIAGNOSIS — R112 Nausea with vomiting, unspecified: Secondary | ICD-10-CM | POA: Diagnosis not present

## 2019-07-17 DIAGNOSIS — Z1159 Encounter for screening for other viral diseases: Secondary | ICD-10-CM | POA: Diagnosis not present

## 2019-07-21 DIAGNOSIS — R112 Nausea with vomiting, unspecified: Secondary | ICD-10-CM | POA: Diagnosis not present

## 2019-07-21 DIAGNOSIS — Z1211 Encounter for screening for malignant neoplasm of colon: Secondary | ICD-10-CM | POA: Diagnosis not present

## 2019-07-21 DIAGNOSIS — K3189 Other diseases of stomach and duodenum: Secondary | ICD-10-CM | POA: Diagnosis not present

## 2019-07-21 DIAGNOSIS — K219 Gastro-esophageal reflux disease without esophagitis: Secondary | ICD-10-CM | POA: Diagnosis not present

## 2019-07-21 DIAGNOSIS — K319 Disease of stomach and duodenum, unspecified: Secondary | ICD-10-CM | POA: Diagnosis not present

## 2019-07-21 DIAGNOSIS — K2930 Chronic superficial gastritis without bleeding: Secondary | ICD-10-CM | POA: Diagnosis not present

## 2019-07-21 DIAGNOSIS — D122 Benign neoplasm of ascending colon: Secondary | ICD-10-CM | POA: Diagnosis not present

## 2019-07-21 DIAGNOSIS — D123 Benign neoplasm of transverse colon: Secondary | ICD-10-CM | POA: Diagnosis not present

## 2019-08-04 DIAGNOSIS — R55 Syncope and collapse: Secondary | ICD-10-CM | POA: Diagnosis not present

## 2019-08-14 DIAGNOSIS — Z23 Encounter for immunization: Secondary | ICD-10-CM | POA: Diagnosis not present

## 2019-08-14 DIAGNOSIS — R399 Unspecified symptoms and signs involving the genitourinary system: Secondary | ICD-10-CM | POA: Diagnosis not present

## 2019-08-14 DIAGNOSIS — R5382 Chronic fatigue, unspecified: Secondary | ICD-10-CM | POA: Diagnosis not present

## 2019-09-11 DIAGNOSIS — N951 Menopausal and female climacteric states: Secondary | ICD-10-CM | POA: Diagnosis not present

## 2019-09-11 DIAGNOSIS — R739 Hyperglycemia, unspecified: Secondary | ICD-10-CM | POA: Diagnosis not present

## 2019-09-27 DIAGNOSIS — G43709 Chronic migraine without aura, not intractable, without status migrainosus: Secondary | ICD-10-CM | POA: Diagnosis not present

## 2019-09-27 DIAGNOSIS — M47816 Spondylosis without myelopathy or radiculopathy, lumbar region: Secondary | ICD-10-CM | POA: Diagnosis not present

## 2019-09-27 DIAGNOSIS — M455 Ankylosing spondylitis of thoracolumbar region: Secondary | ICD-10-CM | POA: Diagnosis not present

## 2019-09-27 DIAGNOSIS — M5416 Radiculopathy, lumbar region: Secondary | ICD-10-CM | POA: Diagnosis not present

## 2019-09-27 DIAGNOSIS — M961 Postlaminectomy syndrome, not elsewhere classified: Secondary | ICD-10-CM | POA: Diagnosis not present

## 2019-09-27 DIAGNOSIS — Z9682 Presence of neurostimulator: Secondary | ICD-10-CM | POA: Diagnosis not present

## 2019-10-03 DIAGNOSIS — M25532 Pain in left wrist: Secondary | ICD-10-CM | POA: Diagnosis not present

## 2019-10-24 DIAGNOSIS — E663 Overweight: Secondary | ICD-10-CM | POA: Diagnosis not present

## 2019-10-24 DIAGNOSIS — Z6829 Body mass index (BMI) 29.0-29.9, adult: Secondary | ICD-10-CM | POA: Diagnosis not present

## 2019-10-24 DIAGNOSIS — D8689 Sarcoidosis of other sites: Secondary | ICD-10-CM | POA: Diagnosis not present

## 2019-10-24 DIAGNOSIS — M150 Primary generalized (osteo)arthritis: Secondary | ICD-10-CM | POA: Diagnosis not present

## 2019-10-24 DIAGNOSIS — M2550 Pain in unspecified joint: Secondary | ICD-10-CM | POA: Diagnosis not present

## 2019-10-24 DIAGNOSIS — Z1589 Genetic susceptibility to other disease: Secondary | ICD-10-CM | POA: Diagnosis not present

## 2019-10-24 DIAGNOSIS — G894 Chronic pain syndrome: Secondary | ICD-10-CM | POA: Diagnosis not present

## 2019-11-02 DIAGNOSIS — M7989 Other specified soft tissue disorders: Secondary | ICD-10-CM | POA: Diagnosis not present

## 2019-11-22 ENCOUNTER — Other Ambulatory Visit: Payer: Self-pay

## 2019-11-22 DIAGNOSIS — D1721 Benign lipomatous neoplasm of skin and subcutaneous tissue of right arm: Secondary | ICD-10-CM | POA: Diagnosis not present

## 2019-11-22 DIAGNOSIS — M67412 Ganglion, left shoulder: Secondary | ICD-10-CM | POA: Diagnosis not present

## 2019-11-22 DIAGNOSIS — M67432 Ganglion, left wrist: Secondary | ICD-10-CM | POA: Diagnosis not present

## 2019-11-22 DIAGNOSIS — D1739 Benign lipomatous neoplasm of skin and subcutaneous tissue of other sites: Secondary | ICD-10-CM | POA: Diagnosis not present

## 2019-12-28 DIAGNOSIS — H9209 Otalgia, unspecified ear: Secondary | ICD-10-CM | POA: Diagnosis not present

## 2019-12-28 DIAGNOSIS — H66003 Acute suppurative otitis media without spontaneous rupture of ear drum, bilateral: Secondary | ICD-10-CM | POA: Diagnosis not present

## 2020-02-01 DIAGNOSIS — M4726 Other spondylosis with radiculopathy, lumbar region: Secondary | ICD-10-CM | POA: Diagnosis not present

## 2020-02-01 DIAGNOSIS — Z79891 Long term (current) use of opiate analgesic: Secondary | ICD-10-CM | POA: Diagnosis not present

## 2020-02-01 DIAGNOSIS — M545 Low back pain: Secondary | ICD-10-CM | POA: Diagnosis not present

## 2020-02-01 DIAGNOSIS — Z9682 Presence of neurostimulator: Secondary | ICD-10-CM | POA: Diagnosis not present

## 2020-02-01 DIAGNOSIS — M455 Ankylosing spondylitis of thoracolumbar region: Secondary | ICD-10-CM | POA: Diagnosis not present

## 2020-02-01 DIAGNOSIS — Z9689 Presence of other specified functional implants: Secondary | ICD-10-CM | POA: Diagnosis not present

## 2020-02-01 DIAGNOSIS — M961 Postlaminectomy syndrome, not elsewhere classified: Secondary | ICD-10-CM | POA: Diagnosis not present

## 2020-02-01 DIAGNOSIS — G43709 Chronic migraine without aura, not intractable, without status migrainosus: Secondary | ICD-10-CM | POA: Diagnosis not present

## 2020-02-01 DIAGNOSIS — G479 Sleep disorder, unspecified: Secondary | ICD-10-CM | POA: Diagnosis not present

## 2020-02-02 ENCOUNTER — Inpatient Hospital Stay (HOSPITAL_COMMUNITY)
Admission: EM | Admit: 2020-02-02 | Discharge: 2020-02-04 | DRG: 093 | Disposition: A | Discharge status: Home / Self Care | Payer: BC Managed Care – PPO | Attending: Internal Medicine | Admitting: Internal Medicine

## 2020-02-02 ENCOUNTER — Emergency Department (HOSPITAL_COMMUNITY): Payer: BC Managed Care – PPO

## 2020-02-02 ENCOUNTER — Other Ambulatory Visit: Payer: Self-pay

## 2020-02-02 ENCOUNTER — Encounter (HOSPITAL_COMMUNITY): Payer: Self-pay | Admitting: Emergency Medicine

## 2020-02-02 DIAGNOSIS — M6281 Muscle weakness (generalized): Secondary | ICD-10-CM | POA: Diagnosis not present

## 2020-02-02 DIAGNOSIS — R200 Anesthesia of skin: Secondary | ICD-10-CM | POA: Diagnosis not present

## 2020-02-02 DIAGNOSIS — G8310 Monoplegia of lower limb affecting unspecified side: Secondary | ICD-10-CM | POA: Diagnosis not present

## 2020-02-02 DIAGNOSIS — M459 Ankylosing spondylitis of unspecified sites in spine: Secondary | ICD-10-CM | POA: Diagnosis not present

## 2020-02-02 DIAGNOSIS — M4726 Other spondylosis with radiculopathy, lumbar region: Secondary | ICD-10-CM | POA: Diagnosis not present

## 2020-02-02 DIAGNOSIS — J42 Unspecified chronic bronchitis: Secondary | ICD-10-CM | POA: Diagnosis present

## 2020-02-02 DIAGNOSIS — G8334 Monoplegia, unspecified affecting left nondominant side: Principal | ICD-10-CM | POA: Diagnosis present

## 2020-02-02 DIAGNOSIS — Z885 Allergy status to narcotic agent status: Secondary | ICD-10-CM

## 2020-02-02 DIAGNOSIS — Z8249 Family history of ischemic heart disease and other diseases of the circulatory system: Secondary | ICD-10-CM | POA: Diagnosis not present

## 2020-02-02 DIAGNOSIS — Y92410 Unspecified street and highway as the place of occurrence of the external cause: Secondary | ICD-10-CM | POA: Diagnosis not present

## 2020-02-02 DIAGNOSIS — Z20822 Contact with and (suspected) exposure to covid-19: Secondary | ICD-10-CM | POA: Diagnosis present

## 2020-02-02 DIAGNOSIS — Z981 Arthrodesis status: Secondary | ICD-10-CM

## 2020-02-02 DIAGNOSIS — M5126 Other intervertebral disc displacement, lumbar region: Secondary | ICD-10-CM | POA: Diagnosis not present

## 2020-02-02 DIAGNOSIS — K219 Gastro-esophageal reflux disease without esophagitis: Secondary | ICD-10-CM | POA: Diagnosis present

## 2020-02-02 DIAGNOSIS — S0990XA Unspecified injury of head, initial encounter: Secondary | ICD-10-CM | POA: Diagnosis not present

## 2020-02-02 DIAGNOSIS — S134XXA Sprain of ligaments of cervical spine, initial encounter: Secondary | ICD-10-CM | POA: Diagnosis not present

## 2020-02-02 DIAGNOSIS — Z9682 Presence of neurostimulator: Secondary | ICD-10-CM

## 2020-02-02 DIAGNOSIS — M961 Postlaminectomy syndrome, not elsewhere classified: Secondary | ICD-10-CM | POA: Diagnosis present

## 2020-02-02 DIAGNOSIS — M797 Fibromyalgia: Secondary | ICD-10-CM | POA: Diagnosis present

## 2020-02-02 DIAGNOSIS — I10 Essential (primary) hypertension: Secondary | ICD-10-CM | POA: Diagnosis present

## 2020-02-02 DIAGNOSIS — Z79891 Long term (current) use of opiate analgesic: Secondary | ICD-10-CM

## 2020-02-02 DIAGNOSIS — E039 Hypothyroidism, unspecified: Secondary | ICD-10-CM | POA: Diagnosis not present

## 2020-02-02 DIAGNOSIS — Z888 Allergy status to other drugs, medicaments and biological substances status: Secondary | ICD-10-CM

## 2020-02-02 DIAGNOSIS — M62830 Muscle spasm of back: Secondary | ICD-10-CM | POA: Diagnosis not present

## 2020-02-02 DIAGNOSIS — S22009A Unspecified fracture of unspecified thoracic vertebra, initial encounter for closed fracture: Secondary | ICD-10-CM | POA: Diagnosis not present

## 2020-02-02 DIAGNOSIS — S3992XA Unspecified injury of lower back, initial encounter: Secondary | ICD-10-CM | POA: Diagnosis not present

## 2020-02-02 DIAGNOSIS — G2581 Restless legs syndrome: Secondary | ICD-10-CM | POA: Diagnosis not present

## 2020-02-02 DIAGNOSIS — M542 Cervicalgia: Secondary | ICD-10-CM | POA: Diagnosis not present

## 2020-02-02 DIAGNOSIS — R0689 Other abnormalities of breathing: Secondary | ICD-10-CM | POA: Diagnosis not present

## 2020-02-02 DIAGNOSIS — F419 Anxiety disorder, unspecified: Secondary | ICD-10-CM | POA: Diagnosis present

## 2020-02-02 DIAGNOSIS — R29898 Other symptoms and signs involving the musculoskeletal system: Secondary | ICD-10-CM | POA: Diagnosis not present

## 2020-02-02 DIAGNOSIS — F329 Major depressive disorder, single episode, unspecified: Secondary | ICD-10-CM | POA: Diagnosis present

## 2020-02-02 DIAGNOSIS — G8929 Other chronic pain: Secondary | ICD-10-CM | POA: Diagnosis not present

## 2020-02-02 DIAGNOSIS — B009 Herpesviral infection, unspecified: Secondary | ICD-10-CM | POA: Diagnosis present

## 2020-02-02 DIAGNOSIS — M5124 Other intervertebral disc displacement, thoracic region: Secondary | ICD-10-CM | POA: Diagnosis not present

## 2020-02-02 DIAGNOSIS — Z96692 Finger-joint replacement of left hand: Secondary | ICD-10-CM | POA: Diagnosis present

## 2020-02-02 DIAGNOSIS — D869 Sarcoidosis, unspecified: Secondary | ICD-10-CM | POA: Diagnosis present

## 2020-02-02 DIAGNOSIS — R519 Headache, unspecified: Secondary | ICD-10-CM | POA: Diagnosis not present

## 2020-02-02 DIAGNOSIS — Z79899 Other long term (current) drug therapy: Secondary | ICD-10-CM

## 2020-02-02 DIAGNOSIS — Z87891 Personal history of nicotine dependence: Secondary | ICD-10-CM

## 2020-02-02 DIAGNOSIS — Z7951 Long term (current) use of inhaled steroids: Secondary | ICD-10-CM

## 2020-02-02 DIAGNOSIS — F32A Depression, unspecified: Secondary | ICD-10-CM | POA: Diagnosis present

## 2020-02-02 DIAGNOSIS — S199XXA Unspecified injury of neck, initial encounter: Secondary | ICD-10-CM | POA: Diagnosis not present

## 2020-02-02 DIAGNOSIS — R202 Paresthesia of skin: Secondary | ICD-10-CM | POA: Diagnosis not present

## 2020-02-02 DIAGNOSIS — M545 Low back pain: Secondary | ICD-10-CM | POA: Diagnosis not present

## 2020-02-02 DIAGNOSIS — K589 Irritable bowel syndrome without diarrhea: Secondary | ICD-10-CM | POA: Diagnosis not present

## 2020-02-02 DIAGNOSIS — Z90710 Acquired absence of both cervix and uterus: Secondary | ICD-10-CM | POA: Diagnosis not present

## 2020-02-02 DIAGNOSIS — Z79890 Hormone replacement therapy (postmenopausal): Secondary | ICD-10-CM

## 2020-02-02 LAB — CBC WITH DIFFERENTIAL/PLATELET
Abs Immature Granulocytes: 0.03 10*3/uL (ref 0.00–0.07)
Basophils Absolute: 0.1 10*3/uL (ref 0.0–0.1)
Basophils Relative: 1 %
Eosinophils Absolute: 0.2 10*3/uL (ref 0.0–0.5)
Eosinophils Relative: 2 %
HCT: 40.1 % (ref 36.0–46.0)
Hemoglobin: 13.3 g/dL (ref 12.0–15.0)
Immature Granulocytes: 0 %
Lymphocytes Relative: 30 %
Lymphs Abs: 2.3 10*3/uL (ref 0.7–4.0)
MCH: 28.7 pg (ref 26.0–34.0)
MCHC: 33.2 g/dL (ref 30.0–36.0)
MCV: 86.6 fL (ref 80.0–100.0)
Monocytes Absolute: 0.7 10*3/uL (ref 0.1–1.0)
Monocytes Relative: 9 %
Neutro Abs: 4.5 10*3/uL (ref 1.7–7.7)
Neutrophils Relative %: 58 %
Platelets: 321 10*3/uL (ref 150–400)
RBC: 4.63 MIL/uL (ref 3.87–5.11)
RDW: 14.1 % (ref 11.5–15.5)
WBC: 7.7 10*3/uL (ref 4.0–10.5)
nRBC: 0 % (ref 0.0–0.2)

## 2020-02-02 LAB — BASIC METABOLIC PANEL
Anion gap: 11 (ref 5–15)
BUN: 17 mg/dL (ref 6–20)
CO2: 19 mmol/L — ABNORMAL LOW (ref 22–32)
Calcium: 8.9 mg/dL (ref 8.9–10.3)
Chloride: 108 mmol/L (ref 98–111)
Creatinine, Ser: 0.96 mg/dL (ref 0.44–1.00)
GFR calc Af Amer: >60 mL/min (ref >60)
GFR calc non Af Amer: >60 mL/min (ref >60)
Glucose, Bld: 96 mg/dL (ref 70–99)
Potassium: 4 mmol/L (ref 3.5–5.1)
Sodium: 138 mmol/L (ref 135–145)

## 2020-02-02 LAB — SARS CORONAVIRUS 2 BY RT PCR (HOSPITAL ORDER, PERFORMED IN ~~LOC~~ HOSPITAL LAB): SARS Coronavirus 2: NEGATIVE

## 2020-02-02 MED ORDER — MORPHINE SULFATE (PF) 4 MG/ML IV SOLN
4.0000 mg | INTRAVENOUS | Status: DC | PRN
  Administered 2020-02-03 – 2020-02-04 (×9): 4 mg via INTRAVENOUS
  Filled 2020-02-02 (×9): qty 1

## 2020-02-02 MED ORDER — HYDROMORPHONE HCL 1 MG/ML IJ SOLN
2.0000 mg | Freq: Once | INTRAMUSCULAR | Status: AC
  Administered 2020-02-02: 2 mg via INTRAMUSCULAR
  Filled 2020-02-02: qty 2

## 2020-02-02 MED ORDER — ACETAMINOPHEN 325 MG PO TABS: 650.0000 mg | ORAL_TABLET | Freq: Four times a day (QID) | ORAL | Status: DC | PRN

## 2020-02-02 MED ORDER — CYCLOBENZAPRINE HCL 10 MG PO TABS
5.0000 mg | ORAL_TABLET | Freq: Three times a day (TID) | ORAL | Status: DC | PRN
  Administered 2020-02-03 – 2020-02-04 (×2): 5 mg via ORAL
  Filled 2020-02-02 (×3): qty 1

## 2020-02-02 MED ORDER — KETOROLAC TROMETHAMINE 60 MG/2ML IM SOLN
60.0000 mg | Freq: Once | INTRAMUSCULAR | Status: AC
  Administered 2020-02-02: 60 mg via INTRAMUSCULAR
  Filled 2020-02-02 (×2): qty 2
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
## 2020-02-03 ENCOUNTER — Observation Stay (HOSPITAL_COMMUNITY): Payer: BC Managed Care – PPO

## 2020-02-03 DIAGNOSIS — M4726 Other spondylosis with radiculopathy, lumbar region: Secondary | ICD-10-CM | POA: Diagnosis present

## 2020-02-03 DIAGNOSIS — S0990XA Unspecified injury of head, initial encounter: Secondary | ICD-10-CM | POA: Diagnosis not present

## 2020-02-03 DIAGNOSIS — M5124 Other intervertebral disc displacement, thoracic region: Secondary | ICD-10-CM | POA: Diagnosis not present

## 2020-02-03 DIAGNOSIS — J42 Unspecified chronic bronchitis: Secondary | ICD-10-CM | POA: Diagnosis present

## 2020-02-03 DIAGNOSIS — M961 Postlaminectomy syndrome, not elsewhere classified: Secondary | ICD-10-CM | POA: Diagnosis present

## 2020-02-03 DIAGNOSIS — Z8249 Family history of ischemic heart disease and other diseases of the circulatory system: Secondary | ICD-10-CM | POA: Diagnosis not present

## 2020-02-03 DIAGNOSIS — F329 Major depressive disorder, single episode, unspecified: Secondary | ICD-10-CM | POA: Diagnosis present

## 2020-02-03 DIAGNOSIS — G8929 Other chronic pain: Secondary | ICD-10-CM | POA: Diagnosis present

## 2020-02-03 DIAGNOSIS — R519 Headache, unspecified: Secondary | ICD-10-CM | POA: Diagnosis not present

## 2020-02-03 DIAGNOSIS — S134XXA Sprain of ligaments of cervical spine, initial encounter: Secondary | ICD-10-CM | POA: Diagnosis present

## 2020-02-03 DIAGNOSIS — Z87891 Personal history of nicotine dependence: Secondary | ICD-10-CM | POA: Diagnosis not present

## 2020-02-03 DIAGNOSIS — M459 Ankylosing spondylitis of unspecified sites in spine: Secondary | ICD-10-CM | POA: Diagnosis present

## 2020-02-03 DIAGNOSIS — R29898 Other symptoms and signs involving the musculoskeletal system: Secondary | ICD-10-CM

## 2020-02-03 DIAGNOSIS — K219 Gastro-esophageal reflux disease without esophagitis: Secondary | ICD-10-CM | POA: Diagnosis present

## 2020-02-03 DIAGNOSIS — G8334 Monoplegia, unspecified affecting left nondominant side: Secondary | ICD-10-CM | POA: Diagnosis present

## 2020-02-03 DIAGNOSIS — Z981 Arthrodesis status: Secondary | ICD-10-CM | POA: Diagnosis not present

## 2020-02-03 DIAGNOSIS — G2581 Restless legs syndrome: Secondary | ICD-10-CM | POA: Diagnosis present

## 2020-02-03 DIAGNOSIS — D869 Sarcoidosis, unspecified: Secondary | ICD-10-CM | POA: Diagnosis present

## 2020-02-03 DIAGNOSIS — G8310 Monoplegia of lower limb affecting unspecified side: Secondary | ICD-10-CM | POA: Diagnosis not present

## 2020-02-03 DIAGNOSIS — M5126 Other intervertebral disc displacement, lumbar region: Secondary | ICD-10-CM | POA: Diagnosis not present

## 2020-02-03 DIAGNOSIS — I10 Essential (primary) hypertension: Secondary | ICD-10-CM | POA: Diagnosis present

## 2020-02-03 DIAGNOSIS — M62830 Muscle spasm of back: Secondary | ICD-10-CM | POA: Diagnosis not present

## 2020-02-03 DIAGNOSIS — R202 Paresthesia of skin: Secondary | ICD-10-CM | POA: Diagnosis not present

## 2020-02-03 DIAGNOSIS — Z20822 Contact with and (suspected) exposure to covid-19: Secondary | ICD-10-CM | POA: Diagnosis present

## 2020-02-03 DIAGNOSIS — B009 Herpesviral infection, unspecified: Secondary | ICD-10-CM | POA: Diagnosis present

## 2020-02-03 DIAGNOSIS — K589 Irritable bowel syndrome without diarrhea: Secondary | ICD-10-CM | POA: Diagnosis present

## 2020-02-03 DIAGNOSIS — E039 Hypothyroidism, unspecified: Secondary | ICD-10-CM | POA: Diagnosis present

## 2020-02-03 DIAGNOSIS — F419 Anxiety disorder, unspecified: Secondary | ICD-10-CM | POA: Diagnosis present

## 2020-02-03 DIAGNOSIS — Z90710 Acquired absence of both cervix and uterus: Secondary | ICD-10-CM | POA: Diagnosis not present

## 2020-02-03 DIAGNOSIS — Z96692 Finger-joint replacement of left hand: Secondary | ICD-10-CM | POA: Diagnosis present

## 2020-02-03 DIAGNOSIS — Y92410 Unspecified street and highway as the place of occurrence of the external cause: Secondary | ICD-10-CM | POA: Diagnosis not present

## 2020-02-03 DIAGNOSIS — M797 Fibromyalgia: Secondary | ICD-10-CM | POA: Diagnosis present

## 2020-02-03 LAB — BASIC METABOLIC PANEL WITH GFR
Anion gap: 8 (ref 5–15)
BUN: 17 mg/dL (ref 6–20)
CO2: 24 mmol/L (ref 22–32)
Calcium: 9 mg/dL (ref 8.9–10.3)
Chloride: 105 mmol/L (ref 98–111)
Creatinine, Ser: 0.93 mg/dL (ref 0.44–1.00)
GFR calc Af Amer: >60 mL/min
GFR calc non Af Amer: >60 mL/min
Glucose, Bld: 109 mg/dL — ABNORMAL HIGH (ref 70–99)
Potassium: 3.9 mmol/L (ref 3.5–5.1)
Sodium: 137 mmol/L (ref 135–145)

## 2020-02-03 LAB — CBC
HCT: 36.7 % (ref 36.0–46.0)
Hemoglobin: 12.5 g/dL (ref 12.0–15.0)
MCH: 28.6 pg (ref 26.0–34.0)
MCHC: 34.1 g/dL (ref 30.0–36.0)
MCV: 84 fL (ref 80.0–100.0)
Platelets: 277 10*3/uL (ref 150–400)
RBC: 4.37 MIL/uL (ref 3.87–5.11)
RDW: 13.7 % (ref 11.5–15.5)
WBC: 6.7 10*3/uL (ref 4.0–10.5)
nRBC: 0 % (ref 0.0–0.2)

## 2020-02-03 LAB — PROTIME-INR
INR: 1.1 (ref 0.8–1.2)
Prothrombin Time: 13.3 seconds (ref 11.4–15.2)

## 2020-02-03 LAB — HIV ANTIBODY (ROUTINE TESTING W REFLEX): HIV Screen 4th Generation wRfx: NONREACTIVE

## 2020-02-03 MED ORDER — BUPROPION HCL ER (XL) 150 MG PO TB24
150.0000 mg | ORAL_TABLET | Freq: Every day | ORAL | Status: DC
  Administered 2020-02-03 – 2020-02-04 (×2): 150 mg via ORAL
  Filled 2020-02-03 (×2): qty 1

## 2020-02-03 MED ORDER — PAROXETINE HCL 20 MG PO TABS
40.0000 mg | ORAL_TABLET | Freq: Every day | ORAL | Status: DC
  Administered 2020-02-03: 40 mg via ORAL
  Filled 2020-02-03 (×2): qty 2

## 2020-02-03 MED ORDER — GABAPENTIN 400 MG PO CAPS
400.0000 mg | ORAL_CAPSULE | Freq: Every day | ORAL | Status: DC
  Administered 2020-02-03 (×2): 400 mg via ORAL
  Filled 2020-02-03 (×2): qty 1

## 2020-02-03 MED ORDER — ROPINIROLE HCL 1 MG PO TABS
2.0000 mg | ORAL_TABLET | Freq: Every day | ORAL | Status: DC
  Administered 2020-02-03 (×2): 2 mg via ORAL
  Filled 2020-02-03 (×2): qty 2

## 2020-02-03 MED ORDER — ZOLPIDEM TARTRATE 5 MG PO TABS
5.0000 mg | ORAL_TABLET | Freq: Every evening | ORAL | Status: DC | PRN
  Administered 2020-02-03: 5 mg via ORAL
  Filled 2020-02-03: qty 1

## 2020-02-03 MED ORDER — FLUTICASONE FUROATE-VILANTEROL 100-25 MCG/INH IN AEPB
1.0000 | INHALATION_SPRAY | Freq: Every day | RESPIRATORY_TRACT | Status: DC
  Filled 2020-02-03: qty 28

## 2020-02-03 MED ORDER — PANTOPRAZOLE SODIUM 40 MG PO TBEC
40.0000 mg | DELAYED_RELEASE_TABLET | Freq: Every day | ORAL | Status: DC
  Administered 2020-02-03 – 2020-02-04 (×2): 40 mg via ORAL
  Filled 2020-02-03 (×2): qty 1

## 2020-02-03 MED ORDER — LEVOTHYROXINE SODIUM 25 MCG PO TABS
137.0000 ug | ORAL_TABLET | Freq: Every day | ORAL | Status: DC
  Administered 2020-02-03 – 2020-02-04 (×2): 137 ug via ORAL
  Filled 2020-02-03 (×2): qty 1

## 2020-02-03 MED ORDER — LIDOCAINE HCL (PF) 1 % IJ SOLN
INTRAMUSCULAR | Status: AC
  Administered 2020-02-03: 5 mL via INTRADERMAL
  Filled 2020-02-03: qty 10

## 2020-02-03 MED ORDER — IOHEXOL 300 MG/ML  SOLN
10.0000 mL | Freq: Once | INTRAMUSCULAR | Status: AC | PRN
  Administered 2020-02-03: 10 mL via INTRATHECAL

## 2020-02-03 MED ORDER — LIDOCAINE HCL (PF) 1 % IJ SOLN: 5.0000 mL | Freq: Once | INTRAMUSCULAR | Status: DC

## 2020-02-03 MED ORDER — HYDROMORPHONE HCL 2 MG PO TABS
4.0000 mg | ORAL_TABLET | Freq: Three times a day (TID) | ORAL | Status: DC
  Administered 2020-02-03 – 2020-02-04 (×5): 4 mg via ORAL
  Filled 2020-02-03 (×5): qty 2

## 2020-02-03 NOTE — ED Notes (Signed)
Pt requesting pain medication as well as all nighttime medications. This RN provided same. No other needs expressed at this time. HOB adjusted for comfort, warm blanket provided, lights dimmed.

## 2020-02-03 NOTE — ED Notes (Signed)
Pharmacy messaged regarding unverified/unsent medication

## 2020-02-03 NOTE — Progress Notes (Signed)
Patient ID: [name], female   DOB: Oct 25, 1965, 54 y.o.   MRN: [id number] Describes a heaviness to the left leg but states the pain in the leg and the numbness and tingling have improved.  Complains of moderate aching back pain.  Seems to have some proximal leg weakness in the hip flexors of the quadricep though there is good muscle tone.  Strength is about 2 out of 5.  She wiggles her toes well.  She has her leg propped up on a pillow.  Recommend CT myelogram of thoracic and lumbar spine.  Please let us know when this has been completed so we can evaluate it.
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
## 2020-02-04 MED ORDER — ACETAMINOPHEN 325 MG PO TABS: 650.0000 mg | ORAL_TABLET | Freq: Four times a day (QID) | ORAL | 0 refills | Status: DC | PRN

## 2020-02-04 MED ORDER — CYCLOBENZAPRINE HCL 5 MG PO TABS: 5.0000 mg | ORAL_TABLET | Freq: Three times a day (TID) | ORAL | 0 refills | Status: DC | PRN

## 2020-02-04 NOTE — Care Management (Signed)
Spoke w patient, she declined OP therapies and DME. Has RW WC at home and accessible bathroom.
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
## 2020-02-04 NOTE — Progress Notes (Signed)
Discharge instructions given. Patient verbalized understanding and all questions were answered.  ?

## 2020-02-04 NOTE — Progress Notes (Signed)
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 sleeves on the right at L5 and S1. No finding to explain the acute left-sided symptoms. In the thoracic region, there is wide patency of the canal. No extradural defects. Neurostimulator appears normally positioned in the dorsal epidural space. CT THORACIC MYELOGRAM FINDINGS: Normal thoracic alignment. Neurostimulator in the dorsal midline from mid T7 to the level of the T9-10 disc. No disc pathology at T6-7 or above. At T7-8, there is a shallow right paracentral disc herniation that narrows the ventral subarachnoid space but does not affect the cord. At T8-9, there is a shallow right paracentral disc herniation that indents the thecal sac slightly but does not affect the cord. T9-10 disc bulges mildly. The T10-11 disc shows a shallow protrusion towards the left but no apparent neural compression. T11-12 and T12-L1 are normal. CT LUMBAR MYELOGRAM FINDINGS: Normal lumbar alignment. No disc abnormality at T12-L1, L1-2 or L2-3. L3-4: Minimal noncompressive disc bulge.  Minimal facet hypertrophy. L4-5: Previous disc arthroplasty. Allowing for artifact, there is no complicating feature. Canal and foramina appear widely patent. Mild facet osteoarthritis. L5-S1: Previous discectomy and fusion procedure. Fusion appears solid. Wide patency of the canal and foramina. As noted previously, the right L5  and S1 root sleeves are conjoined. This explains the density in the foramen on the right that was questioned at the previous noncontrast CT. The appearance is unchanged from the previous myelogram of 2010. IMPRESSION: No acute finding identified on these examinations. Good appearance of the lumbar spine, with previous disc arthroplasty at L4-5 and previous fusion procedure at L5-S1. No traumatic finding. 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 No abnormality seen to explain the left leg symptoms. There is mild facet osteoarthritis at L4-5. In the thoracic region, dorsal neurostimulator appears well positioned. There is a right posterolateral disc herniation at T7-8 which was described on an MRI study of 07/20/2016 and is probably chronic. This indents the thecal sac but does not appear to affect the cord. Smaller shallow disc herniation towards the right at T8-9, also previously described. Shallow left-sided disc protrusion at T10-11, also previously described. Electronically Signed   By: Nelson Chimes M.D.   On: 02/03/2020 14:21    Assessment/Plan: CT myelogram of thoracic and lumbar spine look good and show no etiology of left lower extremity symptoms.  Her strength is improved.  She is stable for discharge home from our standpoint with follow-up in 2 weeks in the office.  Please call for any further questions.  Estimated body mass index is 28.73 kg/m as calculated from the following:   Height as of this encounter: 5\' 6"  (1.676 m).   Weight as of this encounter: 80.7 kg.    LOS: 1 day    Eustace Moore 02/04/2020, 8:25 AM

## 2020-02-15 DIAGNOSIS — R29898 Other symptoms and signs involving the musculoskeletal system: Secondary | ICD-10-CM | POA: Diagnosis not present

## 2020-02-20 DIAGNOSIS — E039 Hypothyroidism, unspecified: Secondary | ICD-10-CM | POA: Diagnosis not present

## 2020-02-20 DIAGNOSIS — Z0000 Encounter for general adult medical examination without abnormal findings: Secondary | ICD-10-CM | POA: Diagnosis not present

## 2020-02-20 DIAGNOSIS — E559 Vitamin D deficiency, unspecified: Secondary | ICD-10-CM | POA: Diagnosis not present

## 2020-02-20 DIAGNOSIS — I10 Essential (primary) hypertension: Secondary | ICD-10-CM | POA: Diagnosis not present

## 2020-02-20 DIAGNOSIS — E785 Hyperlipidemia, unspecified: Secondary | ICD-10-CM | POA: Diagnosis not present

## 2020-02-28 ENCOUNTER — Other Ambulatory Visit: Payer: Self-pay

## 2020-02-28 ENCOUNTER — Encounter: Payer: Self-pay | Admitting: Physical Therapy

## 2020-02-28 ENCOUNTER — Ambulatory Visit: Payer: BC Managed Care – PPO | Attending: Family Medicine | Admitting: Physical Therapy

## 2020-02-28 DIAGNOSIS — M62830 Muscle spasm of back: Secondary | ICD-10-CM | POA: Insufficient documentation

## 2020-02-28 DIAGNOSIS — R2681 Unsteadiness on feet: Secondary | ICD-10-CM | POA: Insufficient documentation
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
## 2020-02-28 NOTE — Patient Instructions (Addendum)
Lower abdominal/core stability exercises  1. Practice your breathing technique: Inhale through your nose expanding your belly and rib cage. Try not to breathe into your chest. Exhale slowly and gradually out your mouth feeling a sense of softness to your body. Practice multiple times. This can be performed unlimited.  Access Code: [email or web address] URL: [email or web address] Date: 02/28/2020 Prepared by: Almyra Free  Exercises Seated Thoracic Extension Arms Overhead - 1 x daily - 7 x weekly - 10 reps - 1-2 sets - 2-3 sec hold Seated Trunk Rotation - 1 x daily - 7 x weekly - 10 reps - 1-2 sets - 2-3 sec hold Seated Diagonal Chops with Medicine Ball - 1 x daily - 7 x weekly - 10 reps - 1-2 sets - 2-3 hold  Patient Education Trigger Point Dry Needling

## 2020-03-06 ENCOUNTER — Encounter: Payer: BC Managed Care – PPO | Admitting: Physical Therapy

## 2020-03-12 ENCOUNTER — Encounter: Payer: BC Managed Care – PPO | Admitting: Physical Therapy

## 2020-03-27 ENCOUNTER — Encounter: Payer: BC Managed Care – PPO | Admitting: Physical Therapy

## 2020-03-28 DIAGNOSIS — R200 Anesthesia of skin: Secondary | ICD-10-CM | POA: Diagnosis not present

## 2020-03-28 DIAGNOSIS — M961 Postlaminectomy syndrome, not elsewhere classified: Secondary | ICD-10-CM | POA: Diagnosis not present

## 2020-03-28 DIAGNOSIS — R29898 Other symptoms and signs involving the musculoskeletal system: Secondary | ICD-10-CM | POA: Diagnosis not present

## 2020-03-29 ENCOUNTER — Ambulatory Visit: Payer: BC Managed Care – PPO | Admitting: Physical Therapy

## 2020-03-29 ENCOUNTER — Telehealth: Payer: Self-pay | Admitting: Physical Therapy

## 2020-03-29 NOTE — Telephone Encounter (Signed)
PTA called pt for missed appt. Pt had been sick all week and perhaps forgot to call for this appt. Left message to call our office.   Myrene Galas, PTA @TODAY @ 8:15 AM

## 2020-04-03 ENCOUNTER — Encounter: Payer: BC Managed Care – PPO | Admitting: Physical Therapy

## 2020-04-26 ENCOUNTER — Encounter: Payer: Self-pay | Admitting: Pulmonary Disease

## 2020-04-26 ENCOUNTER — Ambulatory Visit (INDEPENDENT_AMBULATORY_CARE_PROVIDER_SITE_OTHER): Payer: BC Managed Care – PPO | Admitting: Pulmonary Disease

## 2020-04-26 ENCOUNTER — Other Ambulatory Visit: Payer: Self-pay

## 2020-04-26 DIAGNOSIS — D869 Sarcoidosis, unspecified: Secondary | ICD-10-CM

## 2020-04-26 LAB — COMPREHENSIVE METABOLIC PANEL
ALT: 14 U/L (ref 0–35)
AST: 16 U/L (ref 0–37)
Albumin: 4.2 g/dL (ref 3.5–5.2)
Alkaline Phosphatase: 119 U/L — ABNORMAL HIGH (ref 39–117)
BUN: 19 mg/dL (ref 6–23)
CO2: 24 mEq/L (ref 19–32)
Calcium: 9 mg/dL (ref 8.4–10.5)
Chloride: 104 mEq/L (ref 96–112)
Creatinine, Ser: 0.88 mg/dL (ref 0.40–1.20)
GFR: 67.09 mL/min (ref >60.00)
Glucose, Bld: 191 mg/dL — ABNORMAL HIGH (ref 70–99)
Potassium: 4.1 mEq/L (ref 3.5–5.1)
Sodium: 136 mEq/L (ref 135–145)
Total Bilirubin: 0.3 mg/dL (ref 0.2–1.2)
Total Protein: 6.7 g/dL (ref 6.0–8.3)

## 2020-04-26 LAB — SEDIMENTATION RATE: Sed Rate: 7 mm/hr (ref 0–30)

## 2020-04-26 LAB — CBC
HCT: 38.4 % (ref 36.0–46.0)
Hemoglobin: 13 g/dL (ref 12.0–15.0)
MCHC: 33.8 g/dL (ref 30.0–36.0)
MCV: 84.2 fl (ref 78.0–100.0)
Platelets: 326 10*3/uL (ref 150.0–400.0)
RBC: 4.56 Mil/uL (ref 3.87–5.11)
RDW: 14.1 % (ref 11.5–15.5)
WBC: 6.9 10*3/uL (ref 4.0–10.5)

## 2020-04-26 NOTE — Patient Instructions (Signed)
Will arrange for lab tests, high resolution CT chest, and pulmonary function test  Follow up in 3 to 4 weeks with Dr. Halford Chessman or Nurse Practitioner
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
## 2020-04-29 LAB — ANGIOTENSIN CONVERTING ENZYME: Angiotensin-Converting Enzyme: 84 U/L — ABNORMAL HIGH (ref 9–67)

## 2020-05-03 DIAGNOSIS — R29898 Other symptoms and signs involving the musculoskeletal system: Secondary | ICD-10-CM | POA: Diagnosis not present

## 2020-05-08 ENCOUNTER — Ambulatory Visit
Admission: RE | Admit: 2020-05-08 | Discharge: 2020-05-08 | Disposition: A | Discharge status: Home / Self Care | Payer: BC Managed Care – PPO | Source: Ambulatory Visit | Attending: Pulmonary Disease | Admitting: Pulmonary Disease

## 2020-05-08 DIAGNOSIS — D869 Sarcoidosis, unspecified: Secondary | ICD-10-CM

## 2020-05-08 DIAGNOSIS — R0602 Shortness of breath: Secondary | ICD-10-CM | POA: Diagnosis not present

## 2020-05-17 MED ORDER — PREDNISONE 20 MG PO TABS: ORAL_TABLET | ORAL | 1 refills | Status: DC

## 2020-05-17 NOTE — Telephone Encounter (Signed)
Dr. Halford Chessman, please see pt's mychart message and advise:  Dr. Halford Chessman,  I have been asked by Dr. Dwyane Dee to come back to work, which I agreed to do. My first day is September 27th and I was scheduled for the PFT and to see Tammy October 1st.  I wanted to ask what can we do to treat the problems I am having with the sarcoidosis since my ACE level was high and the ct showed fairly enlarged lymph nodes. I am having problems getting short of breath.  Thanks,  [name]  Pt had to cancel her appt since she has gone back to work and did not have time to make appt.

## 2020-05-17 NOTE — Telephone Encounter (Signed)
Need to start with prednisone to get symptoms under control.  Can then arrange for follow up to discuss options for steroid sparing medications.  Please send script for prednisone 20 mg pills, and she needs to take 2 pills (40 mg) daily for 2 weeks, then 1.5 pills (30 mg) daily until next ROV.  She needs to reschedule visit with me or NP toward end of October.

## 2020-05-31 ENCOUNTER — Ambulatory Visit: Payer: BC Managed Care – PPO | Admitting: Adult Health

## 2020-06-03 DIAGNOSIS — Z9682 Presence of neurostimulator: Secondary | ICD-10-CM | POA: Diagnosis not present

## 2020-06-03 DIAGNOSIS — M455 Ankylosing spondylitis of thoracolumbar region: Secondary | ICD-10-CM | POA: Diagnosis not present

## 2020-06-03 DIAGNOSIS — M961 Postlaminectomy syndrome, not elsewhere classified: Secondary | ICD-10-CM | POA: Diagnosis not present

## 2020-06-03 DIAGNOSIS — G43709 Chronic migraine without aura, not intractable, without status migrainosus: Secondary | ICD-10-CM | POA: Diagnosis not present

## 2020-06-03 DIAGNOSIS — M47816 Spondylosis without myelopathy or radiculopathy, lumbar region: Secondary | ICD-10-CM | POA: Diagnosis not present

## 2020-06-03 DIAGNOSIS — G4700 Insomnia, unspecified: Secondary | ICD-10-CM | POA: Diagnosis not present

## 2020-08-20 DIAGNOSIS — H66001 Acute suppurative otitis media without spontaneous rupture of ear drum, right ear: Secondary | ICD-10-CM | POA: Diagnosis not present

## 2020-08-20 DIAGNOSIS — N952 Postmenopausal atrophic vaginitis: Secondary | ICD-10-CM | POA: Diagnosis not present

## 2020-09-14 DIAGNOSIS — Z20822 Contact with and (suspected) exposure to covid-19: Secondary | ICD-10-CM | POA: Diagnosis not present

## 2020-09-14 DIAGNOSIS — H66002 Acute suppurative otitis media without spontaneous rupture of ear drum, left ear: Secondary | ICD-10-CM | POA: Diagnosis not present

## 2020-09-14 DIAGNOSIS — U071 COVID-19: Secondary | ICD-10-CM | POA: Diagnosis not present

## 2020-10-04 MED ORDER — BREO ELLIPTA 100-25 MCG/INH IN AEPB: 1.0000 | INHALATION_SPRAY | Freq: Every day | RESPIRATORY_TRACT | 0 refills | Status: DC

## 2020-10-04 MED ORDER — ALBUTEROL SULFATE HFA 108 (90 BASE) MCG/ACT IN AERS
2.0000 | INHALATION_SPRAY | Freq: Four times a day (QID) | RESPIRATORY_TRACT | 0 refills | Status: DC | PRN
Start: 2020-10-04 — End: 2020-10-25

## 2020-10-10 DIAGNOSIS — Z9682 Presence of neurostimulator: Secondary | ICD-10-CM | POA: Diagnosis not present

## 2020-10-10 DIAGNOSIS — G4710 Hypersomnia, unspecified: Secondary | ICD-10-CM | POA: Diagnosis not present

## 2020-10-10 DIAGNOSIS — M455 Ankylosing spondylitis of thoracolumbar region: Secondary | ICD-10-CM | POA: Diagnosis not present

## 2020-10-10 DIAGNOSIS — G43009 Migraine without aura, not intractable, without status migrainosus: Secondary | ICD-10-CM | POA: Diagnosis not present

## 2020-10-24 ENCOUNTER — Other Ambulatory Visit: Payer: Self-pay | Admitting: Pulmonary Disease

## 2020-10-25 DIAGNOSIS — R590 Localized enlarged lymph nodes: Secondary | ICD-10-CM | POA: Diagnosis not present

## 2020-10-25 DIAGNOSIS — R112 Nausea with vomiting, unspecified: Secondary | ICD-10-CM | POA: Diagnosis not present

## 2020-10-25 DIAGNOSIS — E663 Overweight: Secondary | ICD-10-CM | POA: Diagnosis not present

## 2020-10-25 DIAGNOSIS — R1110 Vomiting, unspecified: Secondary | ICD-10-CM | POA: Diagnosis not present

## 2020-10-25 DIAGNOSIS — M2550 Pain in unspecified joint: Secondary | ICD-10-CM | POA: Diagnosis not present

## 2020-10-25 DIAGNOSIS — Z6828 Body mass index (BMI) 28.0-28.9, adult: Secondary | ICD-10-CM | POA: Diagnosis not present

## 2020-10-25 DIAGNOSIS — K439 Ventral hernia without obstruction or gangrene: Secondary | ICD-10-CM | POA: Diagnosis not present

## 2020-10-25 DIAGNOSIS — D8689 Sarcoidosis of other sites: Secondary | ICD-10-CM | POA: Diagnosis not present

## 2020-10-25 DIAGNOSIS — M150 Primary generalized (osteo)arthritis: Secondary | ICD-10-CM | POA: Diagnosis not present

## 2020-10-25 DIAGNOSIS — M0609 Rheumatoid arthritis without rheumatoid factor, multiple sites: Secondary | ICD-10-CM | POA: Diagnosis not present

## 2020-10-25 DIAGNOSIS — M459 Ankylosing spondylitis of unspecified sites in spine: Secondary | ICD-10-CM | POA: Diagnosis not present

## 2020-10-25 DIAGNOSIS — R1084 Generalized abdominal pain: Secondary | ICD-10-CM | POA: Diagnosis not present

## 2020-10-25 DIAGNOSIS — R1013 Epigastric pain: Secondary | ICD-10-CM | POA: Diagnosis not present

## 2020-10-25 DIAGNOSIS — R197 Diarrhea, unspecified: Secondary | ICD-10-CM | POA: Diagnosis not present

## 2020-10-25 DIAGNOSIS — G894 Chronic pain syndrome: Secondary | ICD-10-CM | POA: Diagnosis not present

## 2020-10-26 DIAGNOSIS — G4733 Obstructive sleep apnea (adult) (pediatric): Secondary | ICD-10-CM | POA: Diagnosis not present

## 2020-10-28 ENCOUNTER — Other Ambulatory Visit: Payer: Self-pay | Admitting: Pulmonary Disease

## 2020-11-04 ENCOUNTER — Telehealth: Payer: Self-pay | Admitting: Pulmonary Disease

## 2020-11-04 NOTE — Telephone Encounter (Signed)
Left message for [name] at [facility] that we have not done a recent CT on this pt. The only Ct that I see is in care everywhere done on 10/25/20 at [facility].

## 2020-11-05 NOTE — Telephone Encounter (Signed)
I have faxed over the HRCT to [facility] at [place]

## 2020-11-05 NOTE — Telephone Encounter (Signed)
Return call back from [place] she can be reached @ [phone] ext [phone].Hillery Hunter

## 2020-11-05 NOTE — Telephone Encounter (Signed)
lmtcb for [employer].

## 2020-11-15 DIAGNOSIS — R109 Unspecified abdominal pain: Secondary | ICD-10-CM | POA: Diagnosis not present

## 2020-11-15 DIAGNOSIS — R590 Localized enlarged lymph nodes: Secondary | ICD-10-CM | POA: Diagnosis not present

## 2020-11-15 DIAGNOSIS — R197 Diarrhea, unspecified: Secondary | ICD-10-CM | POA: Diagnosis not present

## 2020-11-18 DIAGNOSIS — G8929 Other chronic pain: Secondary | ICD-10-CM | POA: Diagnosis not present

## 2020-11-18 DIAGNOSIS — R1013 Epigastric pain: Secondary | ICD-10-CM | POA: Diagnosis not present

## 2020-11-18 DIAGNOSIS — K439 Ventral hernia without obstruction or gangrene: Secondary | ICD-10-CM | POA: Diagnosis not present

## 2020-11-18 DIAGNOSIS — R079 Chest pain, unspecified: Secondary | ICD-10-CM | POA: Diagnosis not present

## 2020-11-18 DIAGNOSIS — I700 Atherosclerosis of aorta: Secondary | ICD-10-CM | POA: Diagnosis not present

## 2020-11-18 DIAGNOSIS — R590 Localized enlarged lymph nodes: Secondary | ICD-10-CM | POA: Diagnosis not present

## 2020-11-18 DIAGNOSIS — Z90710 Acquired absence of both cervix and uterus: Secondary | ICD-10-CM | POA: Diagnosis not present

## 2020-11-18 DIAGNOSIS — R109 Unspecified abdominal pain: Secondary | ICD-10-CM | POA: Diagnosis not present

## 2020-11-21 DIAGNOSIS — R109 Unspecified abdominal pain: Secondary | ICD-10-CM | POA: Diagnosis not present

## 2020-11-21 DIAGNOSIS — R1011 Right upper quadrant pain: Secondary | ICD-10-CM | POA: Diagnosis not present

## 2020-11-21 DIAGNOSIS — K8020 Calculus of gallbladder without cholecystitis without obstruction: Secondary | ICD-10-CM | POA: Diagnosis not present

## 2020-12-09 DIAGNOSIS — R935 Abnormal findings on diagnostic imaging of other abdominal regions, including retroperitoneum: Secondary | ICD-10-CM | POA: Diagnosis not present

## 2020-12-09 DIAGNOSIS — M455 Ankylosing spondylitis of thoracolumbar region: Secondary | ICD-10-CM | POA: Diagnosis not present

## 2020-12-09 DIAGNOSIS — R590 Localized enlarged lymph nodes: Secondary | ICD-10-CM | POA: Diagnosis not present

## 2020-12-09 DIAGNOSIS — R197 Diarrhea, unspecified: Secondary | ICD-10-CM | POA: Diagnosis not present

## 2020-12-09 DIAGNOSIS — R232 Flushing: Secondary | ICD-10-CM | POA: Diagnosis not present

## 2020-12-09 DIAGNOSIS — D869 Sarcoidosis, unspecified: Secondary | ICD-10-CM | POA: Diagnosis not present

## 2020-12-25 DIAGNOSIS — D8689 Sarcoidosis of other sites: Secondary | ICD-10-CM | POA: Diagnosis not present

## 2021-01-02 DIAGNOSIS — M2550 Pain in unspecified joint: Secondary | ICD-10-CM | POA: Diagnosis not present

## 2021-01-15 DIAGNOSIS — M150 Primary generalized (osteo)arthritis: Secondary | ICD-10-CM | POA: Diagnosis not present

## 2021-01-15 DIAGNOSIS — E663 Overweight: Secondary | ICD-10-CM | POA: Diagnosis not present

## 2021-01-15 DIAGNOSIS — M2550 Pain in unspecified joint: Secondary | ICD-10-CM | POA: Diagnosis not present

## 2021-01-15 DIAGNOSIS — Z6828 Body mass index (BMI) 28.0-28.9, adult: Secondary | ICD-10-CM | POA: Diagnosis not present

## 2021-01-15 DIAGNOSIS — M459 Ankylosing spondylitis of unspecified sites in spine: Secondary | ICD-10-CM | POA: Diagnosis not present

## 2021-01-15 DIAGNOSIS — Z1589 Genetic susceptibility to other disease: Secondary | ICD-10-CM | POA: Diagnosis not present

## 2021-01-15 DIAGNOSIS — D869 Sarcoidosis, unspecified: Secondary | ICD-10-CM | POA: Diagnosis not present

## 2021-01-16 DIAGNOSIS — G8929 Other chronic pain: Secondary | ICD-10-CM | POA: Diagnosis not present

## 2021-01-21 DIAGNOSIS — Z87891 Personal history of nicotine dependence: Secondary | ICD-10-CM | POA: Diagnosis not present

## 2021-01-21 DIAGNOSIS — R109 Unspecified abdominal pain: Secondary | ICD-10-CM | POA: Diagnosis not present

## 2021-01-21 DIAGNOSIS — I10 Essential (primary) hypertension: Secondary | ICD-10-CM | POA: Diagnosis not present

## 2021-01-24 DIAGNOSIS — R109 Unspecified abdominal pain: Secondary | ICD-10-CM | POA: Diagnosis not present

## 2021-01-24 DIAGNOSIS — K581 Irritable bowel syndrome with constipation: Secondary | ICD-10-CM | POA: Diagnosis not present

## 2021-02-17 DIAGNOSIS — D8689 Sarcoidosis of other sites: Secondary | ICD-10-CM | POA: Diagnosis not present

## 2021-03-05 DIAGNOSIS — G8929 Other chronic pain: Secondary | ICD-10-CM | POA: Diagnosis not present

## 2021-03-05 DIAGNOSIS — Z9682 Presence of neurostimulator: Secondary | ICD-10-CM | POA: Diagnosis not present

## 2021-03-05 DIAGNOSIS — M542 Cervicalgia: Secondary | ICD-10-CM | POA: Diagnosis not present

## 2021-03-05 DIAGNOSIS — Z79899 Other long term (current) drug therapy: Secondary | ICD-10-CM | POA: Diagnosis not present

## 2021-03-05 DIAGNOSIS — M961 Postlaminectomy syndrome, not elsewhere classified: Secondary | ICD-10-CM | POA: Diagnosis not present

## 2021-03-05 DIAGNOSIS — G43709 Chronic migraine without aura, not intractable, without status migrainosus: Secondary | ICD-10-CM | POA: Diagnosis not present

## 2021-03-17 DIAGNOSIS — D8689 Sarcoidosis of other sites: Secondary | ICD-10-CM | POA: Diagnosis not present

## 2021-03-20 DIAGNOSIS — I10 Essential (primary) hypertension: Secondary | ICD-10-CM | POA: Diagnosis not present

## 2021-03-20 DIAGNOSIS — E559 Vitamin D deficiency, unspecified: Secondary | ICD-10-CM | POA: Diagnosis not present

## 2021-03-20 DIAGNOSIS — Z0000 Encounter for general adult medical examination without abnormal findings: Secondary | ICD-10-CM | POA: Diagnosis not present

## 2021-03-20 DIAGNOSIS — R739 Hyperglycemia, unspecified: Secondary | ICD-10-CM | POA: Diagnosis not present

## 2021-03-20 DIAGNOSIS — D869 Sarcoidosis, unspecified: Secondary | ICD-10-CM | POA: Diagnosis not present

## 2021-03-20 DIAGNOSIS — F3341 Major depressive disorder, recurrent, in partial remission: Secondary | ICD-10-CM | POA: Diagnosis not present

## 2021-03-20 DIAGNOSIS — I2510 Atherosclerotic heart disease of native coronary artery without angina pectoris: Secondary | ICD-10-CM | POA: Diagnosis not present

## 2021-03-20 DIAGNOSIS — E785 Hyperlipidemia, unspecified: Secondary | ICD-10-CM | POA: Diagnosis not present

## 2021-03-20 DIAGNOSIS — E039 Hypothyroidism, unspecified: Secondary | ICD-10-CM | POA: Diagnosis not present

## 2021-04-23 DIAGNOSIS — M0609 Rheumatoid arthritis without rheumatoid factor, multiple sites: Secondary | ICD-10-CM | POA: Diagnosis not present

## 2021-04-23 DIAGNOSIS — E663 Overweight: Secondary | ICD-10-CM | POA: Diagnosis not present

## 2021-04-23 DIAGNOSIS — Z6829 Body mass index (BMI) 29.0-29.9, adult: Secondary | ICD-10-CM | POA: Diagnosis not present

## 2021-04-23 DIAGNOSIS — M2550 Pain in unspecified joint: Secondary | ICD-10-CM | POA: Diagnosis not present

## 2021-04-23 DIAGNOSIS — D8689 Sarcoidosis of other sites: Secondary | ICD-10-CM | POA: Diagnosis not present

## 2021-04-23 DIAGNOSIS — Z1589 Genetic susceptibility to other disease: Secondary | ICD-10-CM | POA: Diagnosis not present

## 2021-04-23 DIAGNOSIS — M459 Ankylosing spondylitis of unspecified sites in spine: Secondary | ICD-10-CM | POA: Diagnosis not present

## 2021-04-23 DIAGNOSIS — G894 Chronic pain syndrome: Secondary | ICD-10-CM | POA: Diagnosis not present

## 2021-04-23 DIAGNOSIS — M7071 Other bursitis of hip, right hip: Secondary | ICD-10-CM | POA: Diagnosis not present

## 2021-04-23 DIAGNOSIS — M150 Primary generalized (osteo)arthritis: Secondary | ICD-10-CM | POA: Diagnosis not present

## 2021-04-23 DIAGNOSIS — R197 Diarrhea, unspecified: Secondary | ICD-10-CM | POA: Diagnosis not present

## 2021-04-29 DIAGNOSIS — R197 Diarrhea, unspecified: Secondary | ICD-10-CM | POA: Diagnosis not present

## 2021-04-29 DIAGNOSIS — R590 Localized enlarged lymph nodes: Secondary | ICD-10-CM | POA: Diagnosis not present

## 2021-04-29 DIAGNOSIS — R109 Unspecified abdominal pain: Secondary | ICD-10-CM | POA: Diagnosis not present

## 2021-05-12 DIAGNOSIS — D8689 Sarcoidosis of other sites: Secondary | ICD-10-CM | POA: Diagnosis not present

## 2021-06-09 DIAGNOSIS — R1010 Upper abdominal pain, unspecified: Secondary | ICD-10-CM | POA: Diagnosis not present

## 2021-07-07 DIAGNOSIS — D8689 Sarcoidosis of other sites: Secondary | ICD-10-CM | POA: Diagnosis not present

## 2021-07-15 DIAGNOSIS — R109 Unspecified abdominal pain: Secondary | ICD-10-CM | POA: Diagnosis not present

## 2021-07-15 DIAGNOSIS — R590 Localized enlarged lymph nodes: Secondary | ICD-10-CM | POA: Diagnosis not present

## 2021-07-15 DIAGNOSIS — R197 Diarrhea, unspecified: Secondary | ICD-10-CM | POA: Diagnosis not present

## 2021-07-15 DIAGNOSIS — R1010 Upper abdominal pain, unspecified: Secondary | ICD-10-CM | POA: Diagnosis not present

## 2021-07-16 DIAGNOSIS — M455 Ankylosing spondylitis of thoracolumbar region: Secondary | ICD-10-CM | POA: Diagnosis not present

## 2021-07-16 DIAGNOSIS — G43709 Chronic migraine without aura, not intractable, without status migrainosus: Secondary | ICD-10-CM | POA: Diagnosis not present

## 2021-07-16 DIAGNOSIS — M47816 Spondylosis without myelopathy or radiculopathy, lumbar region: Secondary | ICD-10-CM | POA: Diagnosis not present

## 2021-07-16 DIAGNOSIS — M961 Postlaminectomy syndrome, not elsewhere classified: Secondary | ICD-10-CM | POA: Diagnosis not present

## 2021-07-16 DIAGNOSIS — Z9689 Presence of other specified functional implants: Secondary | ICD-10-CM | POA: Diagnosis not present

## 2021-07-16 DIAGNOSIS — Z79899 Other long term (current) drug therapy: Secondary | ICD-10-CM | POA: Diagnosis not present

## 2021-07-16 DIAGNOSIS — G4733 Obstructive sleep apnea (adult) (pediatric): Secondary | ICD-10-CM | POA: Diagnosis not present

## 2021-07-18 DIAGNOSIS — R109 Unspecified abdominal pain: Secondary | ICD-10-CM | POA: Diagnosis not present

## 2021-07-18 DIAGNOSIS — R197 Diarrhea, unspecified: Secondary | ICD-10-CM | POA: Diagnosis not present

## 2021-07-18 DIAGNOSIS — R590 Localized enlarged lymph nodes: Secondary | ICD-10-CM | POA: Diagnosis not present

## 2021-08-20 DIAGNOSIS — Z6829 Body mass index (BMI) 29.0-29.9, adult: Secondary | ICD-10-CM | POA: Diagnosis not present

## 2021-08-20 DIAGNOSIS — E663 Overweight: Secondary | ICD-10-CM | POA: Diagnosis not present

## 2021-08-20 DIAGNOSIS — M0609 Rheumatoid arthritis without rheumatoid factor, multiple sites: Secondary | ICD-10-CM | POA: Diagnosis not present

## 2021-08-20 DIAGNOSIS — Z79899 Other long term (current) drug therapy: Secondary | ICD-10-CM | POA: Diagnosis not present

## 2021-08-20 DIAGNOSIS — M459 Ankylosing spondylitis of unspecified sites in spine: Secondary | ICD-10-CM | POA: Diagnosis not present

## 2021-08-20 DIAGNOSIS — R1013 Epigastric pain: Secondary | ICD-10-CM | POA: Diagnosis not present

## 2021-08-20 DIAGNOSIS — Z1589 Genetic susceptibility to other disease: Secondary | ICD-10-CM | POA: Diagnosis not present

## 2021-08-20 DIAGNOSIS — D869 Sarcoidosis, unspecified: Secondary | ICD-10-CM | POA: Diagnosis not present

## 2021-08-30 ENCOUNTER — Emergency Department (HOSPITAL_BASED_OUTPATIENT_CLINIC_OR_DEPARTMENT_OTHER)
Admission: EM | Admit: 2021-08-30 | Discharge: 2021-08-30 | Disposition: A | Discharge status: Home / Self Care | Payer: Medicare Other | Attending: Emergency Medicine | Admitting: Emergency Medicine

## 2021-08-30 ENCOUNTER — Other Ambulatory Visit: Payer: Self-pay

## 2021-08-30 DIAGNOSIS — M7989 Other specified soft tissue disorders: Secondary | ICD-10-CM | POA: Diagnosis not present

## 2021-08-30 DIAGNOSIS — Z5321 Procedure and treatment not carried out due to patient leaving prior to being seen by health care provider: Secondary | ICD-10-CM | POA: Diagnosis not present

## 2021-08-30 DIAGNOSIS — M25561 Pain in right knee: Secondary | ICD-10-CM | POA: Insufficient documentation

## 2021-08-30 NOTE — ED Notes (Signed)
Called times 3 with no answer

## 2021-08-30 NOTE — ED Notes (Signed)
Called for room x 3. No response

## 2021-08-30 NOTE — ED Triage Notes (Addendum)
Pt reports pain to right knee for one month. Swelling and pain, worse in the last 48 hours. Ambulatory.

## 2021-08-30 NOTE — ED Provider Notes (Signed)
Patient left without being seen  1. Patient left without being seen       Deno Etienne, DO 08/30/21 2152

## 2021-09-03 DIAGNOSIS — D8689 Sarcoidosis of other sites: Secondary | ICD-10-CM | POA: Diagnosis not present

## 2021-09-22 DIAGNOSIS — M940 Chondrocostal junction syndrome [Tietze]: Secondary | ICD-10-CM | POA: Diagnosis not present

## 2021-09-22 DIAGNOSIS — R079 Chest pain, unspecified: Secondary | ICD-10-CM | POA: Diagnosis not present

## 2021-09-22 DIAGNOSIS — B349 Viral infection, unspecified: Secondary | ICD-10-CM | POA: Diagnosis not present

## 2021-10-14 ENCOUNTER — Emergency Department (HOSPITAL_BASED_OUTPATIENT_CLINIC_OR_DEPARTMENT_OTHER)
Admission: EM | Admit: 2021-10-14 | Discharge: 2021-10-14 | Disposition: A | Discharge status: Home / Self Care | Payer: Medicare Other | Attending: Emergency Medicine | Admitting: Emergency Medicine

## 2021-10-14 ENCOUNTER — Encounter (HOSPITAL_BASED_OUTPATIENT_CLINIC_OR_DEPARTMENT_OTHER): Payer: Self-pay | Admitting: Emergency Medicine

## 2021-10-14 ENCOUNTER — Emergency Department (HOSPITAL_BASED_OUTPATIENT_CLINIC_OR_DEPARTMENT_OTHER): Payer: Medicare Other | Admitting: Radiology

## 2021-10-14 ENCOUNTER — Other Ambulatory Visit: Payer: Self-pay

## 2021-10-14 DIAGNOSIS — R739 Hyperglycemia, unspecified: Secondary | ICD-10-CM

## 2021-10-14 DIAGNOSIS — R0789 Other chest pain: Secondary | ICD-10-CM | POA: Insufficient documentation

## 2021-10-14 DIAGNOSIS — Z7984 Long term (current) use of oral hypoglycemic drugs: Secondary | ICD-10-CM | POA: Insufficient documentation

## 2021-10-14 DIAGNOSIS — E1165 Type 2 diabetes mellitus with hyperglycemia: Secondary | ICD-10-CM | POA: Diagnosis not present

## 2021-10-14 DIAGNOSIS — Z7952 Long term (current) use of systemic steroids: Secondary | ICD-10-CM | POA: Diagnosis not present

## 2021-10-14 DIAGNOSIS — Z79899 Other long term (current) drug therapy: Secondary | ICD-10-CM | POA: Diagnosis not present

## 2021-10-14 LAB — URINALYSIS, ROUTINE W REFLEX MICROSCOPIC
Bilirubin Urine: NEGATIVE
Glucose, UA: NEGATIVE mg/dL
Hgb urine dipstick: NEGATIVE
Ketones, ur: NEGATIVE mg/dL
Leukocytes,Ua: NEGATIVE
Nitrite: NEGATIVE
Protein, ur: NEGATIVE mg/dL
Specific Gravity, Urine: <1.005 — ABNORMAL LOW (ref 1.005–1.030)
pH: 5 (ref 5.0–8.0)

## 2021-10-14 LAB — BASIC METABOLIC PANEL
Anion gap: 11 (ref 5–15)
BUN: 14 mg/dL (ref 6–20)
CO2: 22 mmol/L (ref 22–32)
Calcium: 9.2 mg/dL (ref 8.9–10.3)
Chloride: 104 mmol/L (ref 98–111)
Creatinine, Ser: 0.93 mg/dL (ref 0.44–1.00)
GFR, Estimated: >60 mL/min (ref >60)
Glucose, Bld: 212 mg/dL — ABNORMAL HIGH (ref 70–99)
Potassium: 3.6 mmol/L (ref 3.5–5.1)
Sodium: 137 mmol/L (ref 135–145)

## 2021-10-14 LAB — TROPONIN I (HIGH SENSITIVITY)
Troponin I (High Sensitivity): 4 ng/L (ref <18)
Troponin I (High Sensitivity): 3 ng/L (ref <18)

## 2021-10-14 LAB — CBC
HCT: 38.9 % (ref 36.0–46.0)
Hemoglobin: 13 g/dL (ref 12.0–15.0)
MCH: 28.8 pg (ref 26.0–34.0)
MCHC: 33.4 g/dL (ref 30.0–36.0)
MCV: 86.1 fL (ref 80.0–100.0)
Platelets: 297 10*3/uL (ref 150–400)
RBC: 4.52 MIL/uL (ref 3.87–5.11)
RDW: 14.3 % (ref 11.5–15.5)
WBC: 8.1 10*3/uL (ref 4.0–10.5)
nRBC: 0 % (ref 0.0–0.2)

## 2021-10-14 LAB — PREGNANCY, URINE: Preg Test, Ur: NEGATIVE

## 2021-10-14 LAB — CBG MONITORING, ED
Glucose-Capillary: 220 mg/dL — ABNORMAL HIGH (ref 70–99)
Glucose-Capillary: 142 mg/dL — ABNORMAL HIGH (ref 70–99)

## 2021-10-14 MED ORDER — METFORMIN HCL 500 MG PO TABS: 500.0000 mg | ORAL_TABLET | Freq: Two times a day (BID) | ORAL | 0 refills | Status: DC

## 2021-10-14 NOTE — ED Provider Notes (Signed)
[facility]  Provider Note  CSN: [id number] Arrival date & time: 10/14/21 1633  History Chief Complaint  Patient presents with   Hyperglycemia    [name] is a 56 y.o. female with history of multiple medical problems including borderline DM (last A1C at PCP was 6.8) was previously on Metformin while on long term prednisone but she is not currently on steroids. She reports last night she felt strange, broke out in a sweat and felt dizzy. She checked her glucose and it was ~300. This morning it was ~250 and she was not feeling much better and so she came to the ED. She reports L upper chest pain which is chronic for her but otherwise she has been in her usual state of health.    Home Medications Prior to Admission medications   Medication Sig Start Date End Date Taking? Authorizing Provider  metFORMIN (GLUCOPHAGE) 500 MG tablet Take 1 tablet (500 mg total) by mouth 2 (two) times daily with a meal. 10/14/21 11/13/21 Yes Truddie Hidden, MD  acetaminophen (TYLENOL) 325 MG tablet Take 2 tablets (650 mg total) by mouth every 6 (six) hours as needed for mild pain or headache. 02/04/20   Regalado, Belkys A, MD  albuterol (VENTOLIN HFA) 108 (90 Base) MCG/ACT inhaler INHALE 2 PUFFS INTO THE LUNGS EVERY 6 (SIX) HOURS AS NEEDED FOR SHORTNESS OF BREATH 10/25/20   Chesley Mires, MD  BREO ELLIPTA 100-25 MCG/INH AEPB INHALE 1 PUFF INTO THE LUNGS DAILY 10/28/20   Chesley Mires, MD  buPROPion (WELLBUTRIN XL) 150 MG 24 hr tablet Take 150 mg by mouth daily.    [provider]  celecoxib (CELEBREX) 200 MG capsule Take 200 mg by mouth 2 (two) times daily as needed for mild pain.  10/24/19   [provider]  cyclobenzaprine (FLEXERIL) 5 MG tablet Take 1 tablet (5 mg total) by mouth 3 (three) times daily as needed for muscle spasms. Patient taking differently: Take 5 mg by mouth 3 (three) times daily as needed for muscle spasms. Two tablets to equal 10mg  02/04/20    Regalado, Belkys A, MD  gabapentin (NEURONTIN) 400 MG capsule Take 400 mg by mouth at bedtime. 01/25/20   [provider]  HYDROmorphone HCl (EXALGO) 8 MG TB24 Take 8 mg by mouth daily. Takes two tablets to equal 16mg     [provider]  levothyroxine (SYNTHROID, LEVOTHROID) 137 MCG tablet Take 137 mcg by mouth daily before breakfast.    [provider]  naloxone (NARCAN) 2 MG/2ML injection 0.4 mg once.  09/05/18   [provider]  omeprazole (PRILOSEC) 40 MG capsule Take 40 mg by mouth at bedtime.     [provider]  ondansetron (ZOFRAN) 4 MG tablet Take 1 tablet (4 mg total) by mouth every 8 (eight) hours as needed for nausea or vomiting. 01/29/17   Seabron Spates, CNM  PARoxetine (PAXIL) 40 MG tablet Take 40 mg by mouth at bedtime. 11/29/15   [provider]  predniSONE (DELTASONE) 20 MG tablet Take 2pills daily x2weeks, then 1.5pills daily until next office visit 05/17/20   Chesley Mires, MD  promethazine (PHENERGAN) 25 MG tablet Take 1 tablet by mouth every 12 (twelve) hours as needed for nausea/vomiting. 01/18/20   [provider]  rOPINIRole (REQUIP) 2 MG tablet Take 2 mg by mouth at bedtime.    [provider]  valACYclovir (VALTREX) 500 MG tablet Take 500 mg by mouth daily.     [provider]     Allergies    Lyrica [pregabalin], Topiramate, Humira [adalimumab], Percocet [oxycodone-acetaminophen], Sulfamethoxazole-trimethoprim, and Vicodin [hydrocodone-acetaminophen]   Review of Systems   Review of Systems Please see HPI for pertinent positives and negatives  Physical Exam BP 135/88    Pulse 86    Temp 98.2 F (36.8 C)    Resp 20    Ht 5' 6.5" (1.689 m)    Wt 81.6 kg    LMP 06/03/2012    SpO2 98%    BMI 28.62 kg/m   Physical Exam Vitals and nursing note reviewed.  Constitutional:      Appearance: Normal appearance.  HENT:     Head: Normocephalic and atraumatic.     Nose: Nose normal.      Mouth/Throat:     Mouth: Mucous membranes are moist.  Eyes:     Extraocular Movements: Extraocular movements intact.     Conjunctiva/sclera: Conjunctivae normal.  Cardiovascular:     Rate and Rhythm: Normal rate.  Pulmonary:     Effort: Pulmonary effort is normal.     Breath sounds: Normal breath sounds.  Chest:     Chest wall: Tenderness (L upper chest wall reproduces symptoms) present.  Abdominal:     General: Abdomen is flat.     Palpations: Abdomen is soft.     Tenderness: There is no abdominal tenderness.  Musculoskeletal:        General: No swelling. Normal range of motion.     Cervical back: Neck supple.  Skin:    General: Skin is warm and dry.  Neurological:     General: No focal deficit present.     Mental Status: She is alert.  Psychiatric:        Mood and Affect: Mood normal.    ED Results / Procedures / Treatments   EKG EKG Interpretation  Date/Time:  Tuesday October 14 2021 16:42:50 EST Ventricular Rate:  108 PR Interval:  146 QRS Duration: 86 QT Interval:  354 QTC Calculation: 474 R Axis:   45 Text Interpretation: Sinus tachycardia Otherwise normal ECG When compared with ECG of 20-Sep-2018 13:49, Since last tracing Rate faster Otherwise no significant change Confirmed by Calvert Cantor [phone]) on 10/14/2021 5:01:36 PM  Procedures Procedures  Medications Ordered in the ED Medications - No data to display  Initial Impression and Plan  Patient with hyperglycemia, not currently on any DM meds. Labs done in triage show normal CBC, BMP with mild hyperglycemia but no anion gap. UA without ketones or glucosuria. EKG, CXR and Trop x 2 done for reported CP and dizziness are unremarkable. She is familiar with metformin from prior use, will start that and recommend close PCP follow up for long term management. Encouraged to RTED for any other concerns.   ED Course       MDM Rules/Calculators/A&P Medical Decision Making Problems Addressed: Chest wall pain:  chronic illness or injury with exacerbation, progression, or side effects of treatment Hyperglycemia: acute illness or injury that poses a threat to life or bodily functions  Amount and/or Complexity of Data Reviewed Labs: ordered. Decision-making details documented in ED Course. Radiology: ordered and independent interpretation performed. Decision-making details documented in ED Course. ECG/medicine tests: ordered and independent interpretation performed. Decision-making details documented in ED Course.  Risk Prescription drug management. Decision regarding hospitalization.    Final Clinical Impression(s) / ED Diagnoses Final diagnoses:  Hyperglycemia  Chest wall pain    Rx / DC Orders ED Discharge Orders  Ordered    metFORMIN (GLUCOPHAGE) 500 MG tablet  2 times daily with meals        10/14/21 2114             Truddie Hidden, MD 10/14/21 2115

## 2021-10-14 NOTE — ED Triage Notes (Signed)
Pt reports hyperglycemia since yesterday (250 and up) and her normal is around 140. Also having dizziness and diaphoresis since last night. Denies any pain.

## 2021-10-24 DIAGNOSIS — N9089 Other specified noninflammatory disorders of vulva and perineum: Secondary | ICD-10-CM | POA: Diagnosis not present

## 2021-10-29 DIAGNOSIS — R5383 Other fatigue: Secondary | ICD-10-CM | POA: Diagnosis not present

## 2021-10-29 DIAGNOSIS — Z79899 Other long term (current) drug therapy: Secondary | ICD-10-CM | POA: Diagnosis not present

## 2021-10-29 DIAGNOSIS — Z111 Encounter for screening for respiratory tuberculosis: Secondary | ICD-10-CM | POA: Diagnosis not present

## 2021-10-29 DIAGNOSIS — D8689 Sarcoidosis of other sites: Secondary | ICD-10-CM | POA: Diagnosis not present

## 2021-10-29 DIAGNOSIS — D869 Sarcoidosis, unspecified: Secondary | ICD-10-CM | POA: Diagnosis not present

## 2021-11-07 DIAGNOSIS — Z03818 Encounter for observation for suspected exposure to other biological agents ruled out: Secondary | ICD-10-CM | POA: Diagnosis not present

## 2021-11-07 DIAGNOSIS — Z20822 Contact with and (suspected) exposure to covid-19: Secondary | ICD-10-CM | POA: Diagnosis not present

## 2021-11-07 DIAGNOSIS — J069 Acute upper respiratory infection, unspecified: Secondary | ICD-10-CM | POA: Diagnosis not present

## 2021-11-07 DIAGNOSIS — Z131 Encounter for screening for diabetes mellitus: Secondary | ICD-10-CM | POA: Diagnosis not present

## 2021-11-12 DIAGNOSIS — G8929 Other chronic pain: Secondary | ICD-10-CM | POA: Diagnosis not present

## 2021-11-12 DIAGNOSIS — F3341 Major depressive disorder, recurrent, in partial remission: Secondary | ICD-10-CM | POA: Diagnosis not present

## 2021-11-12 DIAGNOSIS — I10 Essential (primary) hypertension: Secondary | ICD-10-CM | POA: Diagnosis not present

## 2021-11-12 DIAGNOSIS — E785 Hyperlipidemia, unspecified: Secondary | ICD-10-CM | POA: Diagnosis not present

## 2021-11-12 DIAGNOSIS — E039 Hypothyroidism, unspecified: Secondary | ICD-10-CM | POA: Diagnosis not present

## 2021-11-12 DIAGNOSIS — J0110 Acute frontal sinusitis, unspecified: Secondary | ICD-10-CM | POA: Diagnosis not present

## 2021-11-12 DIAGNOSIS — R000 Tachycardia, unspecified: Secondary | ICD-10-CM | POA: Diagnosis not present

## 2021-11-12 DIAGNOSIS — E1165 Type 2 diabetes mellitus with hyperglycemia: Secondary | ICD-10-CM | POA: Diagnosis not present

## 2021-11-25 DIAGNOSIS — R102 Pelvic and perineal pain: Secondary | ICD-10-CM | POA: Diagnosis not present

## 2021-11-25 DIAGNOSIS — L900 Lichen sclerosus et atrophicus: Secondary | ICD-10-CM | POA: Diagnosis not present

## 2021-11-25 DIAGNOSIS — B3731 Acute candidiasis of vulva and vagina: Secondary | ICD-10-CM | POA: Diagnosis not present

## 2021-11-25 DIAGNOSIS — N9089 Other specified noninflammatory disorders of vulva and perineum: Secondary | ICD-10-CM | POA: Diagnosis not present

## 2021-11-25 DIAGNOSIS — N904 Leukoplakia of vulva: Secondary | ICD-10-CM | POA: Diagnosis not present

## 2021-11-26 DIAGNOSIS — G43909 Migraine, unspecified, not intractable, without status migrainosus: Secondary | ICD-10-CM | POA: Diagnosis not present

## 2021-11-26 DIAGNOSIS — Z79899 Other long term (current) drug therapy: Secondary | ICD-10-CM | POA: Diagnosis not present

## 2021-11-26 DIAGNOSIS — M961 Postlaminectomy syndrome, not elsewhere classified: Secondary | ICD-10-CM | POA: Diagnosis not present

## 2021-11-26 DIAGNOSIS — Z9682 Presence of neurostimulator: Secondary | ICD-10-CM | POA: Diagnosis not present

## 2021-11-26 DIAGNOSIS — G894 Chronic pain syndrome: Secondary | ICD-10-CM | POA: Diagnosis not present

## 2021-11-26 DIAGNOSIS — G4733 Obstructive sleep apnea (adult) (pediatric): Secondary | ICD-10-CM | POA: Diagnosis not present

## 2021-11-26 DIAGNOSIS — M455 Ankylosing spondylitis of thoracolumbar region: Secondary | ICD-10-CM | POA: Diagnosis not present

## 2021-12-29 ENCOUNTER — Encounter: Payer: Self-pay | Admitting: Pulmonary Disease

## 2021-12-29 ENCOUNTER — Ambulatory Visit (INDEPENDENT_AMBULATORY_CARE_PROVIDER_SITE_OTHER): Payer: Medicare Other | Admitting: Pulmonary Disease

## 2021-12-29 VITALS — BP 122/72 | HR 103 | Temp 98.2°F | Ht 66.5 in | Wt 188.2 lb

## 2021-12-29 DIAGNOSIS — D869 Sarcoidosis, unspecified: Secondary | ICD-10-CM | POA: Diagnosis not present

## 2021-12-29 DIAGNOSIS — J4550 Severe persistent asthma, uncomplicated: Secondary | ICD-10-CM | POA: Diagnosis not present

## 2021-12-29 MED ORDER — FLUTICASONE FUROATE-VILANTEROL 100-25 MCG/ACT IN AEPB: 1.0000 | INHALATION_SPRAY | Freq: Every day | RESPIRATORY_TRACT | 5 refills | Status: AC

## 2021-12-29 MED ORDER — FLUTICASONE PROPIONATE 50 MCG/ACT NA SUSP: 1.0000 | Freq: Every evening | NASAL | 2 refills | Status: DC

## 2021-12-29 MED ORDER — AZELASTINE HCL 0.15 % NA SOLN: 1.0000 | Freq: Every morning | NASAL | 2 refills | Status: DC

## 2021-12-29 MED ORDER — ALBUTEROL SULFATE HFA 108 (90 BASE) MCG/ACT IN AERS: 2.0000 | INHALATION_SPRAY | Freq: Four times a day (QID) | RESPIRATORY_TRACT | 6 refills | Status: AC | PRN

## 2021-12-29 NOTE — Patient Instructions (Signed)
Astepro 1 spray in each nostril in the morning for 2 weeks, then as needed. ?Flonase 1 spray in each nostril in the evening for 2 weeks, then as needed. ?Breo one spray daily, and rinse your mouth after each use. ?Albuterol two puffs every 6 hours as needed for cough, wheeze, or chest congestion. ? ?Follow up in 6 weeks ?

## 2021-12-29 NOTE — Progress Notes (Signed)
? ?[facility] ? ?Chief Complaint  ?Patient presents with  ? Follow-up  ?  Patient states that she has been having shortness of breath all the time and worse with exertion. Also tired.   ? ? ?Constitutional:  ?BP 122/72 (BP Location: Right Arm, Patient Position: Sitting, Cuff Size: Normal)   Pulse (!) 103   Temp 98.2 ?F (36.8 ?C) (Oral)   Ht 5' 6.5" (1.689 m)   Wt 188 lb 3.2 oz (85.4 kg)   LMP 06/03/2012   SpO2 96%   BMI 29.92 kg/m?  ? ?Past Medical History:  ?Ankylosing spondylitis, Asthma, Depression, Allergies, Fibromyalgia, GERD, Headache, HSV 2, HTN, IBS, Hypothyroidism, Interstitial cystitis, Pneumonia, RLS, Seizures as teenager, Obstructive sleep apnea ? ?Past Surgical History:  ?Her  has a past surgical history that includes Cesarean section (2000); Pelvic laparoscopy (1990's); Tonsillectomy (1986); Appendectomy (1985); Anterior cervical decomp/discectomy fusion (2011); Abdominal hysterectomy (2006); Knee arthroscopy (Right); Lumbar fusion (12-04-2008); Lumbar disc surgery (02-02-2007); LYNX RETROPUBIC SUBURETHRAL SLING (03-02-2007); Tubal ligation (2001); Pubovaginal sling (N/A, 11/30/2012); cysto with hydrodistension (N/A, 11/30/2012); Video bronchoscopy with endobronchial ultrasound (N/A, 09/21/2014); Mediastinoscopy (N/A, 10/26/2014); Back surgery (2008); Craniectomy for depressed skull fracture (Left, 12/05/2014); Arthroplasty; Mass excision (Left, 03/19/2017); Spinal cord stimulator implant; Back surgery; and Neck surgery. ? ?Brief Summary:  ?[name] is a 57 y.o. female with asthma, and sarcoidosis with skull and lung involvement. ?  ? ? ? ?Subjective:  ? ?I last saw her in August 2021. ? ?Chest xray from 11/18/20 was normal. ? ?She has been working with GI about chronic diarrhea. ? ?She hasn't used inhalers in more than 1 year.   ? ?She has more runny nose and sniffling.  Has been getting cough, wheeze, and short of breath with activity.    ? ?Physical Exam:  ? ?Appearance - well kempt  ? ?ENMT - no sinus tenderness, no oral exudate, no LAN, Mallampati 3 airway, no stridor ? ?Respiratory - equal breath sounds bilaterally, no wheezing or rales ? ?CV - s1s2 regular rate and rhythm, no murmurs ? ?Ext - no clubbing, no edema ? ?Skin - no rashes ? ?Psych - normal mood and affect ?  ?Pulmonary testing:  ?Quantiferon gold 09/20/14 >> negative ?ACE 09/20/14 >> 76 ?Mediastinoscopy 10/26/14 >> granulomas ?PFT 02/05/15 >> FEV1 2.96 (94%), FEV1% 80, TLC 5.27 (95%), DLCO 80%, +BD ?ACE 09/03/15 >> 60 ?  ?Chest Imaging:  ?CT chest 09/19/14 >> Rt paratracheal LAN 1.5 cm, Rt subcarinal LAN 2 cm, Rt hilar LAN 1.4 cm, Lt hilar LAN 1.7 cm ?CT chest 04/30/15 >> decreased mediastinal and hilar LAN, stable 2 mm nodule RML ?CT chest 11/04/16 >> mild LAN, coronary calcification ?HRCT chest 05/08/20 >> lungs clear ? ?Cardiac Tests:  ?Echo 10/16/16 >> mild LVH, EF 55 to 60% ?LHC 10/16/16 >> non obstructive CAD ? ?Social History:  ?She  reports that she quit smoking about 7 years ago. Her smoking use included cigarettes. She has a 7.50 pack-year smoking history. She has never used smokeless tobacco. She reports that she does not currently use alcohol. She reports that she does not use drugs. ? ?Family History:  ?Her family history includes Cancer in her father, maternal grandfather, and paternal grandfather; Diabetes in her mother; Heart disease in her maternal grandmother, mother, and paternal grandmother; Hypertension in her father and mother. ?  ? ? ?Assessment/Plan:  ? ?Allergic asthma and rhinitis. ?- seems to be main respiratory issue at present ?- add astepro and flonase  for 2 weeks, then prn ?- restart breo 100 one puff daily ?- prn albuterol ? ?Systemic sarcoidosis with polyarthritis, Ankylosing spondylitis, HLA B27 positive. ?- followed by Dr. Gavin Pound with [facility] ?- remains on simponi ?- pulmonary involvement has not been an issue with her sarcoid for a  while ? ?Persistent asthma. ?- continue breo and prn albuterol ?  ?Chronic pain, Migraine headaches, Obstructive sleep apnea. ?- followed by Dr. Ermalene Postin with [facility] neurology ? ?Time Spent Involved in Patient Care on Day of Examination:  ?37 minutes ? ?Follow up:  ? ?Patient Instructions  ?Astepro 1 spray in each nostril in the morning for 2 weeks, then as needed. ?Flonase 1 spray in each nostril in the evening for 2 weeks, then as needed. ?Breo one spray daily, and rinse your mouth after each use. ?Albuterol two puffs every 6 hours as needed for cough, wheeze, or chest congestion. ? ?Follow up in 6 weeks ? ?Medication List:  ? ?Allergies as of 12/29/2021   ? ?   Reactions  ? Lyrica [pregabalin] Anaphylaxis  ? Topiramate Other (See Comments)  ? Pt had hallucinations  ? Humira [adalimumab] Other (See Comments)  ? Muscle weakness  ? Infliximab Other (See Comments), Itching, Rash, Hives  ? Other reaction(s): infusion reaction, Other (See Comments)  ? Percocet [oxycodone-acetaminophen] Itching  ? Sulfamethoxazole-trimethoprim Other (See Comments)  ? Fever  ? Vicodin [hydrocodone-acetaminophen] Itching  ? ?  ? ?  ?Medication List  ?  ? ?  ? Accurate as of Dec 29, 2021  3:05 PM. If you have any questions, ask your nurse or doctor.  ?  ?  ? ?  ? ?STOP taking these medications   ? ?acetaminophen 325 MG tablet ?Commonly known as: TYLENOL ?Stopped by: Chesley Mires, MD ?  ?buPROPion 150 MG 24 hr tablet ?Commonly known as: WELLBUTRIN XL ?Stopped by: Chesley Mires, MD ?  ?cyclobenzaprine 5 MG tablet ?Commonly known as: FLEXERIL ?Stopped by: Chesley Mires, MD ?  ?predniSONE 20 MG tablet ?Commonly known as: DELTASONE ?Stopped by: Chesley Mires, MD ?  ? ?  ? ?TAKE these medications   ? ?albuterol 108 (90 Base) MCG/ACT inhaler ?Commonly known as: VENTOLIN HFA ?Inhale 2 puffs into the lungs every 6 (six) hours as needed for wheezing or shortness of breath. ?What changed:  ?reasons to take this ?additional instructions ?Changed by: Chesley Mires,  MD ?  ?Azelastine HCl 0.15 % Soln ?Commonly known as: Astepro ?Place 1 spray into the nose every morning. ?Started by: Chesley Mires, MD ?  ?busPIRone 7.5 MG tablet ?Commonly known as: BUSPAR ?Take 1 tablet by mouth 3 (three) times daily as needed. ?  ?celecoxib 200 MG capsule ?Commonly known as: CELEBREX ?Take 200 mg by mouth 2 (two) times daily as needed for mild pain. ?  ?fluticasone 50 MCG/ACT nasal spray ?Commonly known as: FLONASE ?Place 1 spray into both nostrils at bedtime. ?Started by: Chesley Mires, MD ?  ?fluticasone furoate-vilanterol 100-25 MCG/ACT Aepb ?Commonly known as: Breo Ellipta ?Inhale 1 puff into the lungs daily. ?What changed: medication strength ?Changed by: Chesley Mires, MD ?  ?gabapentin 400 MG capsule ?Commonly known as: NEURONTIN ?Take 400 mg by mouth at bedtime. ?  ?HYDROmorphone HCl 8 MG Tb24 ?Commonly known as: EXALGO ?Take 8 mg by mouth daily. Takes two tablets to equal $Remove'16mg'XgAxAup$  ?  ?levothyroxine 137 MCG tablet ?Commonly known as: SYNTHROID ?Take 137 mcg by mouth daily before breakfast. ?  ?metFORMIN 500 MG tablet ?Commonly known as: GLUCOPHAGE ?Take  1 tablet (500 mg total) by mouth 2 (two) times daily with a meal. ?  ?naloxone 2 MG/2ML injection ?Commonly known as: NARCAN ?0.4 mg once. ?  ?omeprazole 40 MG capsule ?Commonly known as: PRILOSEC ?Take 40 mg by mouth at bedtime. ?  ?ondansetron 4 MG tablet ?Commonly known as: Zofran ?Take 1 tablet (4 mg total) by mouth every 8 (eight) hours as needed for nausea or vomiting. ?  ?PARoxetine 40 MG tablet ?Commonly known as: PAXIL ?Take 40 mg by mouth at bedtime. ?  ?promethazine 25 MG tablet ?Commonly known as: PHENERGAN ?Take 1 tablet by mouth every 12 (twelve) hours as needed for nausea/vomiting. ?  ?rOPINIRole 2 MG tablet ?Commonly known as: REQUIP ?Take 2 mg by mouth at bedtime. ?  ?SIMPONI [place] ?Inject into the skin. ?  ?valACYclovir 500 MG tablet ?Commonly known as: VALTREX ?Take 500 mg by mouth daily. ?  ? ?  ? ? ?Signature:  ?Chesley Mires,  MD ?[place] ?Pager - [phone] - [phone] ?12/29/2021, 3:05 PM ?  ? ? ? ? ? ? ? ? ?

## 2021-12-31 DIAGNOSIS — Z79899 Other long term (current) drug therapy: Secondary | ICD-10-CM | POA: Diagnosis not present

## 2021-12-31 DIAGNOSIS — M0609 Rheumatoid arthritis without rheumatoid factor, multiple sites: Secondary | ICD-10-CM | POA: Diagnosis not present

## 2021-12-31 DIAGNOSIS — D8689 Sarcoidosis of other sites: Secondary | ICD-10-CM | POA: Diagnosis not present

## 2022-01-29 DIAGNOSIS — E039 Hypothyroidism, unspecified: Secondary | ICD-10-CM | POA: Diagnosis not present

## 2022-01-29 DIAGNOSIS — R0789 Other chest pain: Secondary | ICD-10-CM | POA: Diagnosis not present

## 2022-01-29 DIAGNOSIS — E785 Hyperlipidemia, unspecified: Secondary | ICD-10-CM | POA: Diagnosis not present

## 2022-01-29 DIAGNOSIS — E1165 Type 2 diabetes mellitus with hyperglycemia: Secondary | ICD-10-CM | POA: Diagnosis not present

## 2022-02-05 DIAGNOSIS — E1165 Type 2 diabetes mellitus with hyperglycemia: Secondary | ICD-10-CM | POA: Diagnosis not present

## 2022-02-05 DIAGNOSIS — E039 Hypothyroidism, unspecified: Secondary | ICD-10-CM | POA: Diagnosis not present

## 2022-02-05 DIAGNOSIS — E782 Mixed hyperlipidemia: Secondary | ICD-10-CM | POA: Diagnosis not present

## 2022-02-05 DIAGNOSIS — I10 Essential (primary) hypertension: Secondary | ICD-10-CM | POA: Diagnosis not present

## 2022-02-09 ENCOUNTER — Ambulatory Visit: Payer: 59 | Admitting: Nurse Practitioner

## 2022-02-18 DIAGNOSIS — M0609 Rheumatoid arthritis without rheumatoid factor, multiple sites: Secondary | ICD-10-CM | POA: Diagnosis not present

## 2022-02-18 DIAGNOSIS — Z1589 Genetic susceptibility to other disease: Secondary | ICD-10-CM | POA: Diagnosis not present

## 2022-02-18 DIAGNOSIS — M459 Ankylosing spondylitis of unspecified sites in spine: Secondary | ICD-10-CM | POA: Diagnosis not present

## 2022-02-18 DIAGNOSIS — E669 Obesity, unspecified: Secondary | ICD-10-CM | POA: Diagnosis not present

## 2022-02-18 DIAGNOSIS — Z6831 Body mass index (BMI) 31.0-31.9, adult: Secondary | ICD-10-CM | POA: Diagnosis not present

## 2022-02-18 DIAGNOSIS — D8689 Sarcoidosis of other sites: Secondary | ICD-10-CM | POA: Diagnosis not present

## 2022-02-18 DIAGNOSIS — Z79899 Other long term (current) drug therapy: Secondary | ICD-10-CM | POA: Diagnosis not present

## 2022-03-04 DIAGNOSIS — M0579 Rheumatoid arthritis with rheumatoid factor of multiple sites without organ or systems involvement: Secondary | ICD-10-CM | POA: Diagnosis not present

## 2022-03-04 DIAGNOSIS — M0609 Rheumatoid arthritis without rheumatoid factor, multiple sites: Secondary | ICD-10-CM | POA: Diagnosis not present

## 2022-03-04 DIAGNOSIS — Z79899 Other long term (current) drug therapy: Secondary | ICD-10-CM | POA: Diagnosis not present

## 2022-03-04 DIAGNOSIS — D8689 Sarcoidosis of other sites: Secondary | ICD-10-CM | POA: Diagnosis not present

## 2022-04-29 DIAGNOSIS — E782 Mixed hyperlipidemia: Secondary | ICD-10-CM | POA: Diagnosis not present

## 2022-04-29 DIAGNOSIS — E559 Vitamin D deficiency, unspecified: Secondary | ICD-10-CM | POA: Diagnosis not present

## 2022-04-29 DIAGNOSIS — D8689 Sarcoidosis of other sites: Secondary | ICD-10-CM | POA: Diagnosis not present

## 2022-04-29 DIAGNOSIS — E1165 Type 2 diabetes mellitus with hyperglycemia: Secondary | ICD-10-CM | POA: Diagnosis not present

## 2022-04-29 DIAGNOSIS — Z0000 Encounter for general adult medical examination without abnormal findings: Secondary | ICD-10-CM | POA: Diagnosis not present

## 2022-04-29 DIAGNOSIS — E039 Hypothyroidism, unspecified: Secondary | ICD-10-CM | POA: Diagnosis not present

## 2022-04-29 DIAGNOSIS — R2689 Other abnormalities of gait and mobility: Secondary | ICD-10-CM | POA: Diagnosis not present

## 2022-04-29 DIAGNOSIS — E538 Deficiency of other specified B group vitamins: Secondary | ICD-10-CM | POA: Diagnosis not present

## 2022-04-29 DIAGNOSIS — I10 Essential (primary) hypertension: Secondary | ICD-10-CM | POA: Diagnosis not present

## 2022-04-29 DIAGNOSIS — R202 Paresthesia of skin: Secondary | ICD-10-CM | POA: Diagnosis not present

## 2022-04-29 DIAGNOSIS — R200 Anesthesia of skin: Secondary | ICD-10-CM | POA: Diagnosis not present

## 2022-04-29 DIAGNOSIS — F4321 Adjustment disorder with depressed mood: Secondary | ICD-10-CM | POA: Diagnosis not present

## 2022-05-20 DIAGNOSIS — G4733 Obstructive sleep apnea (adult) (pediatric): Secondary | ICD-10-CM | POA: Diagnosis not present

## 2022-05-20 DIAGNOSIS — M961 Postlaminectomy syndrome, not elsewhere classified: Secondary | ICD-10-CM | POA: Diagnosis not present

## 2022-05-20 DIAGNOSIS — Z9682 Presence of neurostimulator: Secondary | ICD-10-CM | POA: Diagnosis not present

## 2022-05-20 DIAGNOSIS — Z79899 Other long term (current) drug therapy: Secondary | ICD-10-CM | POA: Diagnosis not present

## 2022-05-20 DIAGNOSIS — G43909 Migraine, unspecified, not intractable, without status migrainosus: Secondary | ICD-10-CM | POA: Diagnosis not present

## 2022-05-20 DIAGNOSIS — M459 Ankylosing spondylitis of unspecified sites in spine: Secondary | ICD-10-CM | POA: Diagnosis not present

## 2022-06-22 DIAGNOSIS — H01004 Unspecified blepharitis left upper eyelid: Secondary | ICD-10-CM | POA: Diagnosis not present

## 2022-07-13 DIAGNOSIS — Z23 Encounter for immunization: Secondary | ICD-10-CM | POA: Diagnosis not present

## 2022-07-30 DIAGNOSIS — Z1331 Encounter for screening for depression: Secondary | ICD-10-CM | POA: Diagnosis not present

## 2022-07-30 DIAGNOSIS — Z136 Encounter for screening for cardiovascular disorders: Secondary | ICD-10-CM | POA: Diagnosis not present

## 2022-07-30 DIAGNOSIS — K047 Periapical abscess without sinus: Secondary | ICD-10-CM | POA: Diagnosis not present

## 2022-08-05 DIAGNOSIS — Z03818 Encounter for observation for suspected exposure to other biological agents ruled out: Secondary | ICD-10-CM | POA: Diagnosis not present

## 2022-08-05 DIAGNOSIS — H9202 Otalgia, left ear: Secondary | ICD-10-CM | POA: Diagnosis not present

## 2022-08-05 DIAGNOSIS — E039 Hypothyroidism, unspecified: Secondary | ICD-10-CM | POA: Diagnosis not present

## 2022-08-05 DIAGNOSIS — R52 Pain, unspecified: Secondary | ICD-10-CM | POA: Diagnosis not present

## 2022-08-05 DIAGNOSIS — R6883 Chills (without fever): Secondary | ICD-10-CM | POA: Diagnosis not present

## 2022-08-05 DIAGNOSIS — E559 Vitamin D deficiency, unspecified: Secondary | ICD-10-CM | POA: Diagnosis not present

## 2022-08-05 DIAGNOSIS — E538 Deficiency of other specified B group vitamins: Secondary | ICD-10-CM | POA: Diagnosis not present

## 2022-08-05 DIAGNOSIS — E1165 Type 2 diabetes mellitus with hyperglycemia: Secondary | ICD-10-CM | POA: Diagnosis not present

## 2022-08-13 DIAGNOSIS — Z79899 Other long term (current) drug therapy: Secondary | ICD-10-CM | POA: Diagnosis not present

## 2022-08-13 DIAGNOSIS — M459 Ankylosing spondylitis of unspecified sites in spine: Secondary | ICD-10-CM | POA: Diagnosis not present

## 2022-08-13 DIAGNOSIS — D8689 Sarcoidosis of other sites: Secondary | ICD-10-CM | POA: Diagnosis not present

## 2022-08-13 DIAGNOSIS — R5383 Other fatigue: Secondary | ICD-10-CM | POA: Diagnosis not present

## 2022-10-06 DIAGNOSIS — Z79899 Other long term (current) drug therapy: Secondary | ICD-10-CM | POA: Diagnosis not present

## 2022-10-06 DIAGNOSIS — D8689 Sarcoidosis of other sites: Secondary | ICD-10-CM | POA: Diagnosis not present

## 2022-10-06 DIAGNOSIS — E663 Overweight: Secondary | ICD-10-CM | POA: Diagnosis not present

## 2022-10-06 DIAGNOSIS — M459 Ankylosing spondylitis of unspecified sites in spine: Secondary | ICD-10-CM | POA: Diagnosis not present

## 2022-10-06 DIAGNOSIS — Z1589 Genetic susceptibility to other disease: Secondary | ICD-10-CM | POA: Diagnosis not present

## 2022-10-06 DIAGNOSIS — Z6829 Body mass index (BMI) 29.0-29.9, adult: Secondary | ICD-10-CM | POA: Diagnosis not present

## 2022-10-06 DIAGNOSIS — M0579 Rheumatoid arthritis with rheumatoid factor of multiple sites without organ or systems involvement: Secondary | ICD-10-CM | POA: Diagnosis not present

## 2022-10-08 DIAGNOSIS — M459 Ankylosing spondylitis of unspecified sites in spine: Secondary | ICD-10-CM | POA: Diagnosis not present

## 2022-10-27 DIAGNOSIS — R1032 Left lower quadrant pain: Secondary | ICD-10-CM | POA: Diagnosis not present

## 2022-10-27 DIAGNOSIS — R109 Unspecified abdominal pain: Secondary | ICD-10-CM | POA: Diagnosis not present

## 2022-10-28 ENCOUNTER — Ambulatory Visit (HOSPITAL_BASED_OUTPATIENT_CLINIC_OR_DEPARTMENT_OTHER)
Admission: RE | Admit: 2022-10-28 | Discharge: 2022-10-28 | Disposition: A | Discharge status: Home / Self Care | Payer: Medicare Other | Source: Ambulatory Visit | Attending: Family Medicine | Admitting: Family Medicine

## 2022-10-28 ENCOUNTER — Other Ambulatory Visit (HOSPITAL_BASED_OUTPATIENT_CLINIC_OR_DEPARTMENT_OTHER): Payer: Self-pay | Admitting: Family Medicine

## 2022-10-28 DIAGNOSIS — R109 Unspecified abdominal pain: Secondary | ICD-10-CM

## 2022-11-03 ENCOUNTER — Other Ambulatory Visit (HOSPITAL_BASED_OUTPATIENT_CLINIC_OR_DEPARTMENT_OTHER): Payer: Self-pay | Admitting: Family Medicine

## 2022-11-03 ENCOUNTER — Ambulatory Visit (HOSPITAL_BASED_OUTPATIENT_CLINIC_OR_DEPARTMENT_OTHER): Admission: RE | Admit: 2022-11-03 | Payer: Medicare Other | Source: Ambulatory Visit

## 2022-11-03 ENCOUNTER — Emergency Department (HOSPITAL_BASED_OUTPATIENT_CLINIC_OR_DEPARTMENT_OTHER): Payer: Medicare Other

## 2022-11-03 ENCOUNTER — Other Ambulatory Visit: Payer: Self-pay

## 2022-11-03 ENCOUNTER — Emergency Department (HOSPITAL_BASED_OUTPATIENT_CLINIC_OR_DEPARTMENT_OTHER)
Admission: EM | Admit: 2022-11-03 | Discharge: 2022-11-03 | Disposition: A | Discharge status: Home / Self Care | Payer: Medicare Other | Attending: Emergency Medicine | Admitting: Emergency Medicine

## 2022-11-03 DIAGNOSIS — I700 Atherosclerosis of aorta: Secondary | ICD-10-CM | POA: Diagnosis not present

## 2022-11-03 DIAGNOSIS — R112 Nausea with vomiting, unspecified: Secondary | ICD-10-CM | POA: Insufficient documentation

## 2022-11-03 DIAGNOSIS — Z79890 Hormone replacement therapy (postmenopausal): Secondary | ICD-10-CM | POA: Diagnosis not present

## 2022-11-03 DIAGNOSIS — Z79899 Other long term (current) drug therapy: Secondary | ICD-10-CM | POA: Insufficient documentation

## 2022-11-03 DIAGNOSIS — Z7951 Long term (current) use of inhaled steroids: Secondary | ICD-10-CM | POA: Diagnosis not present

## 2022-11-03 DIAGNOSIS — R1012 Left upper quadrant pain: Secondary | ICD-10-CM | POA: Insufficient documentation

## 2022-11-03 DIAGNOSIS — R1013 Epigastric pain: Secondary | ICD-10-CM | POA: Insufficient documentation

## 2022-11-03 DIAGNOSIS — R109 Unspecified abdominal pain: Secondary | ICD-10-CM | POA: Diagnosis not present

## 2022-11-03 DIAGNOSIS — R1010 Upper abdominal pain, unspecified: Secondary | ICD-10-CM

## 2022-11-03 DIAGNOSIS — J45909 Unspecified asthma, uncomplicated: Secondary | ICD-10-CM | POA: Insufficient documentation

## 2022-11-03 DIAGNOSIS — I10 Essential (primary) hypertension: Secondary | ICD-10-CM | POA: Diagnosis not present

## 2022-11-03 DIAGNOSIS — R1011 Right upper quadrant pain: Secondary | ICD-10-CM | POA: Insufficient documentation

## 2022-11-03 DIAGNOSIS — R1084 Generalized abdominal pain: Secondary | ICD-10-CM | POA: Diagnosis not present

## 2022-11-03 DIAGNOSIS — E039 Hypothyroidism, unspecified: Secondary | ICD-10-CM | POA: Diagnosis not present

## 2022-11-03 LAB — LIPASE, BLOOD: Lipase: <10 U/L — ABNORMAL LOW (ref 11–51)

## 2022-11-03 LAB — CBC
HCT: 41.1 % (ref 36.0–46.0)
Hemoglobin: 14.2 g/dL (ref 12.0–15.0)
MCH: 29 pg (ref 26.0–34.0)
MCHC: 34.5 g/dL (ref 30.0–36.0)
MCV: 83.9 fL (ref 80.0–100.0)
Platelets: 336 10*3/uL (ref 150–400)
RBC: 4.9 MIL/uL (ref 3.87–5.11)
RDW: 14.2 % (ref 11.5–15.5)
WBC: 12 10*3/uL — ABNORMAL HIGH (ref 4.0–10.5)
nRBC: 0 % (ref 0.0–0.2)

## 2022-11-03 LAB — COMPREHENSIVE METABOLIC PANEL
ALT: 13 U/L (ref 0–44)
AST: 14 U/L — ABNORMAL LOW (ref 15–41)
Albumin: 4.1 g/dL (ref 3.5–5.0)
Alkaline Phosphatase: 83 U/L (ref 38–126)
Anion gap: 11 (ref 5–15)
BUN: 18 mg/dL (ref 6–20)
CO2: 24 mmol/L (ref 22–32)
Calcium: 9.8 mg/dL (ref 8.9–10.3)
Chloride: 102 mmol/L (ref 98–111)
Creatinine, Ser: 1.14 mg/dL — ABNORMAL HIGH (ref 0.44–1.00)
GFR, Estimated: 56 mL/min — ABNORMAL LOW (ref >60)
Glucose, Bld: 106 mg/dL — ABNORMAL HIGH (ref 70–99)
Potassium: 3.6 mmol/L (ref 3.5–5.1)
Sodium: 137 mmol/L (ref 135–145)
Total Bilirubin: 0.6 mg/dL (ref 0.3–1.2)
Total Protein: 6.8 g/dL (ref 6.5–8.1)

## 2022-11-03 LAB — URINALYSIS, ROUTINE W REFLEX MICROSCOPIC
Bilirubin Urine: NEGATIVE
Glucose, UA: NEGATIVE mg/dL
Hgb urine dipstick: NEGATIVE
Ketones, ur: NEGATIVE mg/dL
Leukocytes,Ua: NEGATIVE
Nitrite: NEGATIVE
Protein, ur: NEGATIVE mg/dL
Specific Gravity, Urine: 1.012 (ref 1.005–1.030)
pH: 5.5 (ref 5.0–8.0)

## 2022-11-03 LAB — LACTIC ACID, PLASMA: Lactic Acid, Venous: 1.6 mmol/L (ref 0.5–1.9)

## 2022-11-03 MED ORDER — MORPHINE SULFATE (PF) 4 MG/ML IV SOLN
4.0000 mg | Freq: Once | INTRAVENOUS | Status: AC
  Administered 2022-11-03: 4 mg via INTRAVENOUS
  Filled 2022-11-03: qty 1

## 2022-11-03 MED ORDER — ONDANSETRON HCL 4 MG PO TABS: 4.0000 mg | ORAL_TABLET | Freq: Three times a day (TID) | ORAL | 0 refills | Status: DC | PRN

## 2022-11-03 MED ORDER — ALUM & MAG HYDROXIDE-SIMETH 200-200-20 MG/5ML PO SUSP
30.0000 mL | Freq: Once | ORAL | Status: AC
  Administered 2022-11-03: 30 mL via ORAL
  Filled 2022-11-03: qty 30

## 2022-11-03 MED ORDER — PROMETHAZINE HCL 25 MG/ML IJ SOLN
INTRAMUSCULAR | Status: AC
  Filled 2022-11-03: qty 1

## 2022-11-03 MED ORDER — IOHEXOL 300 MG/ML  SOLN
100.0000 mL | Freq: Once | INTRAMUSCULAR | Status: AC | PRN
  Administered 2022-11-03: 80 mL via INTRAVENOUS

## 2022-11-03 MED ORDER — SODIUM CHLORIDE 0.9 % IV BOLUS
1000.0000 mL | Freq: Once | INTRAVENOUS | Status: AC
  Administered 2022-11-03: 1000 mL via INTRAVENOUS

## 2022-11-03 MED ORDER — SODIUM CHLORIDE 0.9 % IV SOLN
25.0000 mg | Freq: Four times a day (QID) | INTRAVENOUS | Status: DC | PRN
  Administered 2022-11-03: 25 mg via INTRAVENOUS
  Filled 2022-11-03: qty 1

## 2022-11-03 MED ORDER — TRAMADOL HCL 50 MG PO TABS: 50.0000 mg | ORAL_TABLET | Freq: Four times a day (QID) | ORAL | 0 refills | Status: DC | PRN

## 2022-11-03 MED ORDER — LIDOCAINE VISCOUS HCL 2 % MT SOLN
15.0000 mL | Freq: Once | OROMUCOSAL | Status: AC
  Administered 2022-11-03: 15 mL via ORAL
  Filled 2022-11-03: qty 15

## 2022-11-03 NOTE — ED Triage Notes (Signed)
Pt here for upper abd pain that radiates to back that she woke up with on Friday and has gotten worse since. Pt saw PCP today and recommended a CT, she got a phenergan injection, but still felt bad so she came here. Pt also states she had bruising and abd pain on Tuesday, was seen and got a CT scan then. Pt reports N/V/D.

## 2022-11-03 NOTE — Discharge Instructions (Signed)
Your history, exam, evaluation today are suggestive of a gastritis for GI cause of your pain given the reassuring ultrasound imaging and labs today.  We feel you are safe for discharge home.  Please use the pain medicine and your reflux medication to help treat.  Please follow-up with outpatient GI.  If any symptoms change or worsen acutely, please return to the nearest emergency department.

## 2022-11-03 NOTE — ED Notes (Signed)
Pt requesting water, per MD Tegeler OK for pt to have water.

## 2022-11-03 NOTE — ED Provider Notes (Signed)
[facility] Provider Note   CSN: [id number] Arrival date & time: 11/03/22  1740     History  Chief Complaint  Patient presents with   Abdominal Pain    [name] is a 57 y.o. female.  The history is provided by the patient and medical records. No language interpreter was used.  Abdominal Pain Pain location:  RUQ, LUQ and epigastric Pain quality: aching   Pain radiates to:  L flank Pain severity:  Severe Onset quality:  Gradual Duration:  1 week Timing:  Constant Progression:  Waxing and waning Chronicity:  New Context: not trauma   Relieved by:  Nothing Worsened by:  Nothing Associated symptoms: chills, constipation, fatigue, nausea and vomiting   Associated symptoms: no chest pain, no cough, no diarrhea, no dysuria, no fever, no hematemesis, no hematochezia, no hematuria, no melena, no shortness of breath, no vaginal bleeding and no vaginal discharge        Home Medications Prior to Admission medications   Medication Sig Start Date End Date Taking? Authorizing Provider  albuterol (VENTOLIN HFA) 108 (90 Base) MCG/ACT inhaler Inhale 2 puffs into the lungs every 6 (six) hours as needed for wheezing or shortness of breath. 12/29/21   Chesley Mires, MD  Azelastine HCl (ASTEPRO) 0.15 % SOLN Place 1 spray into the nose every morning. 12/29/21   Chesley Mires, MD  busPIRone (BUSPAR) 7.5 MG tablet Take 1 tablet by mouth 3 (three) times daily as needed. 12/21/21   [provider]  celecoxib (CELEBREX) 200 MG capsule Take 200 mg by mouth 2 (two) times daily as needed for mild pain.  10/24/19   [provider]  fluticasone (FLONASE) 50 MCG/ACT nasal spray Place 1 spray into both nostrils at bedtime. 12/29/21   Chesley Mires, MD  fluticasone furoate-vilanterol (BREO ELLIPTA) 100-25 MCG/ACT AEPB Inhale 1 puff into the lungs daily. 12/29/21   Chesley Mires, MD  gabapentin (NEURONTIN) 400 MG capsule Take 400 mg by mouth at  bedtime. 01/25/20   [provider]  Golimumab ([place]) Inject into the skin.    [provider]  HYDROmorphone HCl (EXALGO) 8 MG TB24 Take 8 mg by mouth daily. Takes two tablets to equal '16mg'$     [provider]  levothyroxine (SYNTHROID, LEVOTHROID) 137 MCG tablet Take 137 mcg by mouth daily before breakfast.    [provider]  metFORMIN (GLUCOPHAGE) 500 MG tablet Take 1 tablet (500 mg total) by mouth 2 (two) times daily with a meal. 10/14/21 11/13/21  Truddie Hidden, MD  naloxone [facility]) 2 MG/2ML injection 0.4 mg once.  09/05/18   [provider]  omeprazole (PRILOSEC) 40 MG capsule Take 40 mg by mouth at bedtime.     [provider]  ondansetron (ZOFRAN) 4 MG tablet Take 1 tablet (4 mg total) by mouth every 8 (eight) hours as needed for nausea or vomiting. 01/29/17   Seabron Spates, CNM  PARoxetine (PAXIL) 40 MG tablet Take 40 mg by mouth at bedtime. 11/29/15   [provider]  promethazine (PHENERGAN) 25 MG tablet Take 1 tablet by mouth every 12 (twelve) hours as needed for nausea/vomiting. 01/18/20   [provider]  rOPINIRole (REQUIP) 2 MG tablet Take 2 mg by mouth at bedtime.    [provider]  valACYclovir (VALTREX) 500 MG tablet Take 500 mg by mouth daily.     [provider]      Allergies    Lyrica [pregabalin],  Topiramate, Humira [adalimumab], Infliximab, Percocet [oxycodone-acetaminophen], Sulfamethoxazole-trimethoprim, and Vicodin [hydrocodone-acetaminophen]    Review of Systems   Review of Systems  Constitutional:  Positive for chills and fatigue. Negative for diaphoresis and fever.  HENT:  Negative for congestion.   Eyes:  Negative for visual disturbance.  Respiratory:  Negative for cough, chest tightness, shortness of breath and wheezing.   Cardiovascular:  Negative for chest pain, palpitations and leg swelling.  Gastrointestinal:  Positive for abdominal pain, constipation, nausea  and vomiting. Negative for blood in stool, diarrhea, hematemesis, hematochezia and melena.  Genitourinary:  Positive for flank pain. Negative for dysuria, frequency, hematuria, vaginal bleeding and vaginal discharge.  Musculoskeletal:  Positive for back pain. Negative for neck pain and neck stiffness.  Skin:  Negative for rash and wound.  Neurological:  Negative for headaches.  Psychiatric/Behavioral:  Negative for agitation and confusion.   All other systems reviewed and are negative.   Physical Exam Updated Vital Signs BP (!) 139/97 (BP Location: Right Arm)   Pulse (!) 110   Temp 98.1 F (36.7 C) (Oral)   Resp 17   LMP 06/03/2012   SpO2 98%  Physical Exam Vitals and nursing note reviewed.  Constitutional:      General: She is not in acute distress.    Appearance: She is well-developed. She is ill-appearing. She is not toxic-appearing or diaphoretic.  HENT:     Head: Normocephalic and atraumatic.     Right Ear: External ear normal.     Left Ear: External ear normal.     Nose: Nose normal.     Mouth/Throat:     Pharynx: No oropharyngeal exudate.  Eyes:     Conjunctiva/sclera: Conjunctivae normal.     Pupils: Pupils are equal, round, and reactive to light.  Cardiovascular:     Rate and Rhythm: Normal rate.     Heart sounds: Normal heart sounds. No murmur heard. Pulmonary:     Effort: Pulmonary effort is normal. No respiratory distress.     Breath sounds: No stridor. No wheezing, rhonchi or rales.  Chest:     Chest wall: No tenderness.  Abdominal:     General: Abdomen is flat. Bowel sounds are normal. There is no distension.     Tenderness: There is abdominal tenderness in the right upper quadrant, epigastric area and left upper quadrant. There is left CVA tenderness. There is no right CVA tenderness, guarding or rebound.  Musculoskeletal:     Cervical back: Normal range of motion and neck supple.  Skin:    General: Skin is warm.     Capillary Refill: Capillary refill  takes less than 2 seconds.     Findings: No erythema or rash.  Neurological:     General: No focal deficit present.     Mental Status: She is alert and oriented to person, place, and time.     Motor: No abnormal muscle tone.     Coordination: Coordination normal.     Deep Tendon Reflexes: Reflexes are normal and symmetric.  Psychiatric:        Mood and Affect: Mood normal.     ED Results / Procedures / Treatments   Labs (all labs ordered are listed, but only abnormal results are displayed) Labs Reviewed  LIPASE, BLOOD - Abnormal; Notable for the following components:      Result Value   Lipase <10 (*)    All other components within normal limits  COMPREHENSIVE METABOLIC PANEL - Abnormal; Notable for the following components:  Glucose, Bld 106 (*)    Creatinine, Ser 1.14 (*)    AST 14 (*)    GFR, Estimated 56 (*)    All other components within normal limits  CBC - Abnormal; Notable for the following components:   WBC 12.0 (*)    All other components within normal limits  URINALYSIS, ROUTINE W REFLEX MICROSCOPIC - Abnormal; Notable for the following components:   Color, Urine COLORLESS (*)    All other components within normal limits  LACTIC ACID, PLASMA  LACTIC ACID, PLASMA    EKG EKG Interpretation  Date/Time:  Tuesday November 03 2022 19:02:18 EST Ventricular Rate:  101 PR Interval:  135 QRS Duration: 82 QT Interval:  367 QTC Calculation: 476 R Axis:   57 Text Interpretation: Sinus tachycardia Borderline T wave abnormalities when compared to prior, similar appearance. No STEMI Confirmed by Antony Blackbird [id number] on 11/03/2022 8:07:54 PM  Radiology CT ABDOMEN PELVIS W CONTRAST  Result Date: 11/03/2022 CLINICAL DATA:  Progressively worsening upper abdominal pain radiating to the back since last Friday. EXAM: CT ABDOMEN AND PELVIS WITH CONTRAST TECHNIQUE: Multidetector CT imaging of the abdomen and pelvis was performed using the standard protocol following bolus  administration of intravenous contrast. RADIATION DOSE REDUCTION: This exam was performed according to the departmental dose-optimization program which includes automated exposure control, adjustment of the mA and/or kV according to patient size and/or use of iterative reconstruction technique. CONTRAST:  4m OMNIPAQUE IOHEXOL 300 MG/ML  SOLN COMPARISON:  CT abdomen pelvis dated October 28, 2022. FINDINGS: Lower chest: No acute abnormality. Hepatobiliary: No focal liver abnormality is seen. No gallstones, gallbladder wall thickening, or biliary dilatation. Pancreas: Unremarkable. No pancreatic ductal dilatation or surrounding inflammatory changes. Spleen: Normal in size without focal abnormality. Adrenals/Urinary Tract: Adrenal glands are unremarkable. Kidneys are normal, without renal calculi, focal lesion, or hydronephrosis. Bladder is unremarkable. Stomach/Bowel: Stomach is within normal limits. History of prior appendectomy. No evidence of bowel wall thickening, distention, or inflammatory changes. Vascular/Lymphatic: Aortic atherosclerosis. No enlarged abdominal or pelvic lymph nodes. Reproductive: Status post hysterectomy. No adnexal masses. Other: New trace free fluid in the pelvis.  No pneumoperitoneum. Musculoskeletal: No acute or significant osseous findings. IMPRESSION: 1. No acute intra-abdominal process. 2. New trace free fluid in the pelvis, nonspecific. 3.  Aortic Atherosclerosis (ICD10-I70.0). Electronically Signed   By: WTitus DubinM.D.   On: 11/03/2022 20:03   UK[place]Abdomen Limited RUQ (LIVER/GB)  Result Date: 11/03/2022 CLINICAL DATA:  Upper abdominal pain radiating to the back for the past 5 days. EXAM: ULTRASOUND ABDOMEN LIMITED RIGHT UPPER QUADRANT COMPARISON:  CT abdomen pelvis dated October 28, 2022. FINDINGS: Gallbladder: No gallstones or wall thickening visualized. No sonographic Murphy sign noted by sonographer. Common bile duct: Diameter: 6 mm, normal. Liver: No focal lesion  identified. Within normal limits in parenchymal echogenicity. Portal vein is patent on color Doppler imaging with normal direction of blood flow towards the liver. Other: None. IMPRESSION: 1. Normal right upper quadrant ultrasound. Electronically Signed   By: WTitus DubinM.D.   On: 11/03/2022 19:42    Procedures Procedures    Medications Ordered in ED Medications  promethazine (PHENERGAN) 25 mg in sodium chloride 0.9 % 50 mL IVPB (0 mg Intravenous Stopped 11/03/22 1904)  promethazine (PHENERGAN) 25 MG/ML injection (has no administration in time range)  morphine (PF) 4 MG/ML injection 4 mg (4 mg Intravenous Given 11/03/22 1847)  sodium chloride 0.9 % bolus 1,000 mL (0 mLs Intravenous Stopped 11/03/22 2055)  iohexol (OMNIPAQUE) 300  MG/ML solution 100 mL (80 mLs Intravenous Contrast Given 11/03/22 1939)  alum & mag hydroxide-simeth (MAALOX/MYLANTA) 200-200-20 MG/5ML suspension 30 mL (30 mLs Oral Given 11/03/22 2256)    And  lidocaine (XYLOCAINE) 2 % viscous mouth solution 15 mL (15 mLs Oral Given 11/03/22 2256)  morphine (PF) 4 MG/ML injection 4 mg (4 mg Intravenous Given 11/03/22 2256)    ED Course/ Medical Decision Making/ A&P                             Medical Decision Making Amount and/or Complexity of Data Reviewed Labs: ordered. Radiology: ordered.  Risk OTC drugs. Prescription drug management.    [name] is a 57 y.o. female    With a past medical history significant for hypertension, hypothyroidism, asthma, sarcoid, interstitial cystitis, seizures, ankylosing spondylitis, previous appendectomy, previous pubovaginal sling, spinal cord stimulator, and migraines who presents with 1 week of worsening nausea, vomiting, upper abdominal pain, decreased oral intake.  According to patient, she was sent from PCP for repeat CT scan evaluation of uncontrolled abdominal pain.  Patient says that for the last week or so she has had pain across her upper abdomen with nausea and vomiting.   She has had minimal intake.  She reports some constipation but no diarrhea for me.  She reports no trauma.  No rashes or skin changes.  Denies any chest pain shortness of breath or palpitations but is having some chills.  Denies cough.  Denies any leg pain or leg swelling.  Reports some left flank/back pain but no midline back pain.  No trauma reported.  On exam, lungs clear and chest nontender.  Abdomen is quite tender across the upper abdomen.  Bowel sounds are appreciated.  Left CVA area is tender but right side is not.  Midline nontender.  Good pulses in extremities.  Patient uncomfortable and nauseous.  Patient reports he was given a dose of Phenergan that seem to help but it has worn off at PCP office.  Clinically I do feel need to get repeat imaging and ultrasound to rule out acute cholecystitis, pancreatitis with complication or other abnormality.  Will get screening labs, give her some pain medicine, nausea medicine, fluids, and continue the workup.  Anticipate reassessment after workup to determine disposition.  10:53 PM CT and ultrasound reassuring.  Labs overall reassuring.  Clinical I spoke she could have gastritis or an ulcer.  Patient says she has had reflux troubles in the past.  She takes Meprazole.  Will give GI cocktail and a dose of pain medicine if she starts to improve and passes p.o. challenge, will discharge home for outpatient GI follow-up.  Patient agrees with this plan.  Anticipate reassessment shortly.         Final Clinical Impression(s) / ED Diagnoses Final diagnoses:  Pain of upper abdomen  Nausea and vomiting, unspecified vomiting type    Rx / DC Orders ED Discharge Orders          Ordered    traMADol (ULTRAM) 50 MG tablet  Every 6 hours PRN        11/03/22 2300    ondansetron (ZOFRAN) 4 MG tablet  Every 8 hours PRN        11/03/22 2300            Clinical Impression: 1. Pain of upper abdomen   2. Nausea and vomiting, unspecified vomiting  type     Disposition: Discharge  Condition: Good  I have discussed the results, Dx and Tx plan with the pt(& family if present). He/she/they expressed understanding and agree(s) with the plan. Discharge instructions discussed at great length. Strict return precautions discussed and pt &/or family have verbalized understanding of the instructions. No further questions at time of discharge.    New Prescriptions   ONDANSETRON (ZOFRAN) 4 MG TABLET    Take 1 tablet (4 mg total) by mouth every 8 (eight) hours as needed for nausea or vomiting.   TRAMADOL (ULTRAM) 50 MG TABLET    Take 1 tablet (50 mg total) by mouth every 6 (six) hours as needed.    Follow Up: Gastroenterology, [address] [place] [id number] [phone]     [facility] [place] [phone]       , Gwenyth Allegra, MD 11/03/22 [phone]

## 2022-11-03 NOTE — ED Notes (Signed)
Pt states she does not need to use the bathroom, aware next urine is needed for sample.

## 2022-12-01 DIAGNOSIS — R194 Change in bowel habit: Secondary | ICD-10-CM | POA: Diagnosis not present

## 2022-12-03 DIAGNOSIS — R194 Change in bowel habit: Secondary | ICD-10-CM | POA: Diagnosis not present

## 2022-12-03 DIAGNOSIS — R5383 Other fatigue: Secondary | ICD-10-CM | POA: Diagnosis not present

## 2022-12-03 DIAGNOSIS — Z111 Encounter for screening for respiratory tuberculosis: Secondary | ICD-10-CM | POA: Diagnosis not present

## 2022-12-03 DIAGNOSIS — M459 Ankylosing spondylitis of unspecified sites in spine: Secondary | ICD-10-CM | POA: Diagnosis not present

## 2022-12-03 DIAGNOSIS — Z79899 Other long term (current) drug therapy: Secondary | ICD-10-CM | POA: Diagnosis not present

## 2022-12-03 DIAGNOSIS — D8689 Sarcoidosis of other sites: Secondary | ICD-10-CM | POA: Diagnosis not present

## 2022-12-04 DIAGNOSIS — R197 Diarrhea, unspecified: Secondary | ICD-10-CM | POA: Diagnosis not present

## 2023-01-12 DIAGNOSIS — K8681 Exocrine pancreatic insufficiency: Secondary | ICD-10-CM | POA: Diagnosis not present

## 2023-01-12 DIAGNOSIS — K529 Noninfective gastroenteritis and colitis, unspecified: Secondary | ICD-10-CM | POA: Diagnosis not present

## 2023-01-13 DIAGNOSIS — I10 Essential (primary) hypertension: Secondary | ICD-10-CM | POA: Diagnosis not present

## 2023-01-13 DIAGNOSIS — E785 Hyperlipidemia, unspecified: Secondary | ICD-10-CM | POA: Diagnosis not present

## 2023-01-13 DIAGNOSIS — G4719 Other hypersomnia: Secondary | ICD-10-CM | POA: Diagnosis not present

## 2023-01-13 DIAGNOSIS — E668 Other obesity: Secondary | ICD-10-CM | POA: Diagnosis not present

## 2023-01-19 DIAGNOSIS — K8681 Exocrine pancreatic insufficiency: Secondary | ICD-10-CM | POA: Diagnosis not present

## 2023-02-03 DIAGNOSIS — M25531 Pain in right wrist: Secondary | ICD-10-CM | POA: Diagnosis not present

## 2023-02-08 DIAGNOSIS — G4733 Obstructive sleep apnea (adult) (pediatric): Secondary | ICD-10-CM | POA: Diagnosis not present

## 2023-02-08 DIAGNOSIS — E785 Hyperlipidemia, unspecified: Secondary | ICD-10-CM | POA: Diagnosis not present

## 2023-02-08 DIAGNOSIS — I10 Essential (primary) hypertension: Secondary | ICD-10-CM | POA: Diagnosis not present

## 2023-02-11 DIAGNOSIS — M459 Ankylosing spondylitis of unspecified sites in spine: Secondary | ICD-10-CM | POA: Diagnosis not present

## 2023-03-02 DIAGNOSIS — E6609 Other obesity due to excess calories: Secondary | ICD-10-CM | POA: Diagnosis not present

## 2023-03-02 DIAGNOSIS — R0789 Other chest pain: Secondary | ICD-10-CM | POA: Diagnosis not present

## 2023-03-02 DIAGNOSIS — Z794 Long term (current) use of insulin: Secondary | ICD-10-CM | POA: Diagnosis not present

## 2023-03-02 DIAGNOSIS — E1122 Type 2 diabetes mellitus with diabetic chronic kidney disease: Secondary | ICD-10-CM | POA: Diagnosis not present

## 2023-03-02 DIAGNOSIS — N1831 Chronic kidney disease, stage 3a: Secondary | ICD-10-CM | POA: Diagnosis not present

## 2023-03-02 DIAGNOSIS — E1165 Type 2 diabetes mellitus with hyperglycemia: Secondary | ICD-10-CM | POA: Diagnosis not present

## 2023-03-02 DIAGNOSIS — Z6831 Body mass index (BMI) 31.0-31.9, adult: Secondary | ICD-10-CM | POA: Diagnosis not present

## 2023-03-02 DIAGNOSIS — E785 Hyperlipidemia, unspecified: Secondary | ICD-10-CM | POA: Diagnosis not present

## 2023-03-02 DIAGNOSIS — E039 Hypothyroidism, unspecified: Secondary | ICD-10-CM | POA: Diagnosis not present

## 2023-03-11 DIAGNOSIS — L089 Local infection of the skin and subcutaneous tissue, unspecified: Secondary | ICD-10-CM | POA: Diagnosis not present

## 2023-03-11 DIAGNOSIS — L0231 Cutaneous abscess of buttock: Secondary | ICD-10-CM | POA: Diagnosis not present

## 2023-03-15 DIAGNOSIS — E668 Other obesity: Secondary | ICD-10-CM | POA: Diagnosis not present

## 2023-03-15 DIAGNOSIS — E1165 Type 2 diabetes mellitus with hyperglycemia: Secondary | ICD-10-CM | POA: Diagnosis not present

## 2023-03-15 DIAGNOSIS — I10 Essential (primary) hypertension: Secondary | ICD-10-CM | POA: Diagnosis not present

## 2023-03-15 DIAGNOSIS — Z6831 Body mass index (BMI) 31.0-31.9, adult: Secondary | ICD-10-CM | POA: Diagnosis not present

## 2023-03-15 DIAGNOSIS — E785 Hyperlipidemia, unspecified: Secondary | ICD-10-CM | POA: Diagnosis not present

## 2023-04-06 DIAGNOSIS — D8689 Sarcoidosis of other sites: Secondary | ICD-10-CM | POA: Diagnosis not present

## 2023-04-06 DIAGNOSIS — M0579 Rheumatoid arthritis with rheumatoid factor of multiple sites without organ or systems involvement: Secondary | ICD-10-CM | POA: Diagnosis not present

## 2023-04-06 DIAGNOSIS — Z79899 Other long term (current) drug therapy: Secondary | ICD-10-CM | POA: Diagnosis not present

## 2023-04-06 DIAGNOSIS — Z6830 Body mass index (BMI) 30.0-30.9, adult: Secondary | ICD-10-CM | POA: Diagnosis not present

## 2023-04-06 DIAGNOSIS — Z1589 Genetic susceptibility to other disease: Secondary | ICD-10-CM | POA: Diagnosis not present

## 2023-04-06 DIAGNOSIS — E669 Obesity, unspecified: Secondary | ICD-10-CM | POA: Diagnosis not present

## 2023-04-06 DIAGNOSIS — G894 Chronic pain syndrome: Secondary | ICD-10-CM | POA: Diagnosis not present

## 2023-04-08 DIAGNOSIS — M459 Ankylosing spondylitis of unspecified sites in spine: Secondary | ICD-10-CM | POA: Diagnosis not present

## 2023-04-08 DIAGNOSIS — R5383 Other fatigue: Secondary | ICD-10-CM | POA: Diagnosis not present

## 2023-04-08 DIAGNOSIS — Z79899 Other long term (current) drug therapy: Secondary | ICD-10-CM | POA: Diagnosis not present

## 2023-04-22 DIAGNOSIS — Z8619 Personal history of other infectious and parasitic diseases: Secondary | ICD-10-CM | POA: Diagnosis not present

## 2023-04-22 DIAGNOSIS — K8681 Exocrine pancreatic insufficiency: Secondary | ICD-10-CM | POA: Diagnosis not present

## 2023-04-27 DIAGNOSIS — R8290 Unspecified abnormal findings in urine: Secondary | ICD-10-CM | POA: Diagnosis not present

## 2023-05-04 DIAGNOSIS — M47816 Spondylosis without myelopathy or radiculopathy, lumbar region: Secondary | ICD-10-CM | POA: Diagnosis not present

## 2023-05-04 DIAGNOSIS — M961 Postlaminectomy syndrome, not elsewhere classified: Secondary | ICD-10-CM | POA: Diagnosis not present

## 2023-05-04 DIAGNOSIS — M455 Ankylosing spondylitis of thoracolumbar region: Secondary | ICD-10-CM | POA: Diagnosis not present

## 2023-05-04 DIAGNOSIS — G4733 Obstructive sleep apnea (adult) (pediatric): Secondary | ICD-10-CM | POA: Diagnosis not present

## 2023-05-04 DIAGNOSIS — G43709 Chronic migraine without aura, not intractable, without status migrainosus: Secondary | ICD-10-CM | POA: Diagnosis not present

## 2023-05-04 DIAGNOSIS — Z9689 Presence of other specified functional implants: Secondary | ICD-10-CM | POA: Diagnosis not present

## 2023-05-05 DIAGNOSIS — G4733 Obstructive sleep apnea (adult) (pediatric): Secondary | ICD-10-CM | POA: Diagnosis not present

## 2023-05-05 DIAGNOSIS — I10 Essential (primary) hypertension: Secondary | ICD-10-CM | POA: Diagnosis not present

## 2023-05-05 DIAGNOSIS — E785 Hyperlipidemia, unspecified: Secondary | ICD-10-CM | POA: Diagnosis not present

## 2023-05-06 DIAGNOSIS — I10 Essential (primary) hypertension: Secondary | ICD-10-CM | POA: Diagnosis not present

## 2023-05-06 DIAGNOSIS — D869 Sarcoidosis, unspecified: Secondary | ICD-10-CM | POA: Diagnosis not present

## 2023-05-06 DIAGNOSIS — E559 Vitamin D deficiency, unspecified: Secondary | ICD-10-CM | POA: Diagnosis not present

## 2023-05-06 DIAGNOSIS — G894 Chronic pain syndrome: Secondary | ICD-10-CM | POA: Diagnosis not present

## 2023-05-06 DIAGNOSIS — Z794 Long term (current) use of insulin: Secondary | ICD-10-CM | POA: Diagnosis not present

## 2023-05-06 DIAGNOSIS — F3341 Major depressive disorder, recurrent, in partial remission: Secondary | ICD-10-CM | POA: Diagnosis not present

## 2023-05-06 DIAGNOSIS — E538 Deficiency of other specified B group vitamins: Secondary | ICD-10-CM | POA: Diagnosis not present

## 2023-05-06 DIAGNOSIS — E785 Hyperlipidemia, unspecified: Secondary | ICD-10-CM | POA: Diagnosis not present

## 2023-05-06 DIAGNOSIS — E039 Hypothyroidism, unspecified: Secondary | ICD-10-CM | POA: Diagnosis not present

## 2023-05-06 DIAGNOSIS — G4733 Obstructive sleep apnea (adult) (pediatric): Secondary | ICD-10-CM | POA: Diagnosis not present

## 2023-05-06 DIAGNOSIS — Z0000 Encounter for general adult medical examination without abnormal findings: Secondary | ICD-10-CM | POA: Diagnosis not present

## 2023-05-06 DIAGNOSIS — E1165 Type 2 diabetes mellitus with hyperglycemia: Secondary | ICD-10-CM | POA: Diagnosis not present

## 2023-05-12 DIAGNOSIS — G4733 Obstructive sleep apnea (adult) (pediatric): Secondary | ICD-10-CM | POA: Diagnosis not present

## 2023-05-12 DIAGNOSIS — Z981 Arthrodesis status: Secondary | ICD-10-CM | POA: Diagnosis not present

## 2023-05-12 DIAGNOSIS — M5412 Radiculopathy, cervical region: Secondary | ICD-10-CM | POA: Diagnosis not present

## 2023-05-12 DIAGNOSIS — M47816 Spondylosis without myelopathy or radiculopathy, lumbar region: Secondary | ICD-10-CM | POA: Diagnosis not present

## 2023-05-12 DIAGNOSIS — M961 Postlaminectomy syndrome, not elsewhere classified: Secondary | ICD-10-CM | POA: Diagnosis not present

## 2023-05-12 DIAGNOSIS — Z9689 Presence of other specified functional implants: Secondary | ICD-10-CM | POA: Diagnosis not present

## 2023-05-12 DIAGNOSIS — M455 Ankylosing spondylitis of thoracolumbar region: Secondary | ICD-10-CM | POA: Diagnosis not present

## 2023-05-12 DIAGNOSIS — G43709 Chronic migraine without aura, not intractable, without status migrainosus: Secondary | ICD-10-CM | POA: Diagnosis not present

## 2023-05-31 DIAGNOSIS — R202 Paresthesia of skin: Secondary | ICD-10-CM | POA: Diagnosis not present

## 2023-05-31 DIAGNOSIS — R200 Anesthesia of skin: Secondary | ICD-10-CM | POA: Diagnosis not present

## 2023-06-02 DIAGNOSIS — M4802 Spinal stenosis, cervical region: Secondary | ICD-10-CM | POA: Diagnosis not present

## 2023-06-02 DIAGNOSIS — M4726 Other spondylosis with radiculopathy, lumbar region: Secondary | ICD-10-CM | POA: Diagnosis not present

## 2023-06-02 DIAGNOSIS — G2581 Restless legs syndrome: Secondary | ICD-10-CM | POA: Diagnosis not present

## 2023-06-11 NOTE — Progress Notes (Deleted)
CARDIOLOGY CONSULT NOTE       Patient ID: Sukhpreet Fiechtner MRN: [id number] DOB/AGE: 11/23/1965 57 y.o.  Admit date: (Not on file) Referring Physician: Jorge Ny FNP Primary Physician: Shirlean Mylar, MD Primary Cardiologist: New Reason for Consultation: Chest pain  Active Problems:   * No active hospital problems. *   HPI:  57 y.o. complicated history including DM, HTN, Hypothyroid, Asthma, Sarcoid, Seizures, ankylosing Spondylitis, migraines and spinal stimulator. Seen in ED 11/03/22 with gastritis Had abdominal pain  Seen by FNP 03/02/23 complained of SSCP on /off several times/week. 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 I last saw patient in 2018 Her cardiac MRI was normal EF 69% and no evidence of cardiac sarcoid She had prior cath 2018 in [place] with normal right dominant cores and echo with EF 55-60%   ***  ROS All other systems reviewed and negative except as noted above  Past Medical History:  Diagnosis Date   Ankylosing spondylitis (HCC)    Anxiety    Arthritis    "back, hands, neck" (09/18/2014)   Asthma    Atypical chest pain    Chronic back pain    "whole spine"   Chronic bronchitis (HCC)    "get it close to q yr" (09/18/2014)   Chronic pain    Complicated migraine    Depression    Environmental allergies    Erosion of suburethral sling (HCC)    Fibromyalgia    Frequency of urination    GERD (gastroesophageal reflux disease)    Headache    "at least several times/wk" (09/18/2014)   Heart murmur    "when I was a baby"   Herpes simplex type 2 infection    History of angina    HLA B27 positive    Hypertension    Hypothyroidism    IBS (irritable bowel syndrome)    IC (interstitial cystitis)    Lesion of frontal lobe of brain    Migraine    "maybe once/month" (09/18/2014)   Nocturia    NSVD (normal spontaneous vaginal delivery)    x3   Osteoarthritis     Pneumonia    RLS (restless legs syndrome)    Sarcoid    Seizures (HCC)    "when I was a teenager"   Shortness of breath dyspnea    Exertion   SUI (stress urinary incontinence, female)    Thyroid disease     Family History  Problem Relation Age of Onset   Diabetes Mother    Hypertension Mother    Heart disease Mother    Cancer Father        LUNG AND BRAIN   Hypertension Father    Heart disease Maternal Grandmother    Cancer Maternal Grandfather        LUNG   Heart disease Paternal Grandmother    Cancer Paternal Grandfather     Social History   Socioeconomic History   Marital status: Married    Spouse name: Not on file   Number of children: Not on file   Years of education: Not on file   Highest education level: Not on file  Occupational History   Occupation: unemployed due to medical reasons  Tobacco Use   Smoking status: Former    Current packs/day: 0.00    Average packs/day: 0.5 packs/day for 15.0 years (7.5 ttl pk-yrs)    Types: Cigarettes  Start date: 01/16/1999    Quit date: 01/15/2014    Years since quitting: 9.4   Smokeless tobacco: Never  Vaping Use   Vaping status: Former  Substance and Sexual Activity   Alcohol use: Not Currently    Comment: few glasses a week   Drug use: Never    Comment: marijuana medicinal patches on her back - 03/18/17 states she no longer uses this   Sexual activity: Not Currently  Other Topics Concern   Not on file  Social History Narrative   ** Merged History Encounter **       Social Determinants of Health   Financial Resource Strain: Not on file  Food Insecurity: Not on file  Transportation Needs: Not on file  Physical Activity: Not on file  Stress: Not on file  Social Connections: Not on file  Intimate Partner Violence: Not on file    Past Surgical History:  Procedure Laterality Date   ABDOMINAL HYSTERECTOMY  2006   ANTERIOR CERVICAL DECOMP/DISCECTOMY FUSION  2011   APPENDECTOMY  1985   ARTHROPLASTY      Left thumb   BACK SURGERY  2008   X 2 in 2010   BACK SURGERY     CESAREAN SECTION  2000   CRANIECTOMY FOR DEPRESSED SKULL FRACTURE Left 12/05/2014   Procedure: Left frontal stereotactic craniectomy for biopsy of skull lesion;  Surgeon: Lisbeth Renshaw, MD;  Location: [facility];  Service: Neurosurgery;  Laterality: Left;  Left frontal stereotactic craniectomy for biopsy of skull lesion   CYSTO WITH HYDRODISTENSION N/A 11/30/2012   Procedure: CYSTOSCOPY/HYDRODISTENSION;  Surgeon: Valetta Fuller, MD;  Location: [facility];  Service: Urology;  Laterality: N/A;   KNEE ARTHROSCOPY Right    LUMBAR DISC SURGERY  02-02-2007   RIGHT SIDE L5 -- S1   LUMBAR FUSION  12-04-2008   L4  -- S1   LYNX RETROPUBIC SUBURETHRAL SLING  03-02-2007   MASS EXCISION Left 03/19/2017   Procedure: EXCISION LEFT WRIST VOLAR MASS;  Surgeon: Dairl Ponder, MD;  Location: [facility];  Service: Orthopedics;  Laterality: Left;   MEDIASTINOSCOPY N/A 10/26/2014   Procedure: MEDIASTINOSCOPY;  Surgeon: Loreli Slot, MD;  [facility];  Service: Thoracic;  Laterality: N/A;   NECK SURGERY     PELVIC LAPAROSCOPY  1990's   LYSIS ADHESIONS   PUBOVAGINAL SLING N/A 11/30/2012   Procedure: Leonides Grills;  Surgeon: Valetta Fuller, MD;  [facility];  Service: Urology;  Laterality: N/A;  1 HR EXAM UNDER ANESTHESIA, EXCISION OF SUB URETHRAL MESH, CYSTO, HOD    SPINAL CORD STIMULATOR IMPLANT     TONSILLECTOMY  1986   TUBAL LIGATION  2001   hulka clip   VIDEO BRONCHOSCOPY WITH ENDOBRONCHIAL ULTRASOUND N/A 09/21/2014   Procedure: VIDEO BRONCHOSCOPY WITH ENDOBRONCHIAL ULTRASOUND;  Surgeon: Leslye Peer, MD;  Location: [facility];  Service: Thoracic;  Laterality: N/A;      Current Outpatient Medications:    albuterol (VENTOLIN HFA) 108 (90 Base) MCG/ACT inhaler, Inhale 2 puffs into the lungs every 6 (six) hours as needed for wheezing or shortness of breath., Disp: 8 g, Rfl: 6   Azelastine  HCl (ASTEPRO) 0.15 % SOLN, Place 1 spray into the nose every morning., Disp: 30 mL, Rfl: 2   busPIRone (BUSPAR) 7.5 MG tablet, Take 1 tablet by mouth 3 (three) times daily as needed., Disp: , Rfl:    celecoxib (CELEBREX) 200 MG capsule, Take 200 mg by mouth 2 (two) times  daily as needed for mild pain. , Disp: , Rfl:    fluticasone (FLONASE) 50 MCG/ACT nasal spray, Place 1 spray into both nostrils at bedtime., Disp: 16 g, Rfl: 2   fluticasone furoate-vilanterol (BREO ELLIPTA) 100-25 MCG/ACT AEPB, Inhale 1 puff into the lungs daily., Disp: 30 each, Rfl: 5   gabapentin (NEURONTIN) 400 MG capsule, Take 400 mg by mouth at bedtime., Disp: , Rfl:    Golimumab (SIMPONI [place]), Inject into the skin., Disp: , Rfl:    HYDROmorphone HCl (EXALGO) 8 MG TB24, Take 8 mg by mouth daily. Takes two tablets to equal 16mg , Disp: , Rfl:    levothyroxine (SYNTHROID, LEVOTHROID) 137 MCG tablet, Take 137 mcg by mouth daily before breakfast., Disp: , Rfl:    metFORMIN (GLUCOPHAGE) 500 MG tablet, Take 1 tablet (500 mg total) by mouth 2 (two) times daily with a meal., Disp: 60 tablet, Rfl: 0   naloxone (NARCAN) 2 MG/2ML injection, 0.4 mg once. , Disp: , Rfl:    omeprazole (PRILOSEC) 40 MG capsule, Take 40 mg by mouth at bedtime. , Disp: , Rfl:    ondansetron (ZOFRAN) 4 MG tablet, Take 1 tablet (4 mg total) by mouth every 8 (eight) hours as needed for nausea or vomiting., Disp: 20 tablet, Rfl: 0   ondansetron (ZOFRAN) 4 MG tablet, Take 1 tablet (4 mg total) by mouth every 8 (eight) hours as needed for nausea or vomiting., Disp: 20 tablet, Rfl: 0   PARoxetine (PAXIL) 40 MG tablet, Take 40 mg by mouth at bedtime., Disp: , Rfl: 3   promethazine (PHENERGAN) 25 MG tablet, Take 1 tablet by mouth every 12 (twelve) hours as needed for nausea/vomiting., Disp: , Rfl:    rOPINIRole (REQUIP) 2 MG tablet, Take 2 mg by mouth at bedtime., Disp: , Rfl:    traMADol (ULTRAM) 50 MG tablet, Take 1 tablet (50 mg total) by mouth every 6 (six) hours as  needed., Disp: 15 tablet, Rfl: 0   valACYclovir (VALTREX) 500 MG tablet, Take 500 mg by mouth daily. , Disp: , Rfl:     Physical Exam: Last menstrual period 06/03/2012.    Affect appropriate Overweight chronically ill female  HEENT: normal Neck supple with no adenopathy JVP normal no bruits no thyromegaly Lungs clear with no wheezing and good diaphragmatic motion Heart:  S1/S2 no murmur, no rub, gallop or click PMI normal Abdomen: benighn, BS positve, no tenderness, no AAA no bruit.  No HSM or HJR Spinal stimulator  Distal pulses intact with no bruits No edema Neuro non-focal Skin warm and dry No muscular weakness   Labs:   Lab Results  Component Value Date   WBC 12.0 (H) 11/03/2022   HGB 14.2 11/03/2022   HCT 41.1 11/03/2022   MCV 83.9 11/03/2022   PLT 336 11/03/2022   No results for input(s): "NA", "K", "CL", "CO2", "BUN", "CREATININE", "CALCIUM", "PROT", "BILITOT", "ALKPHOS", "ALT", "AST", "GLUCOSE" in the last 168 hours.  Invalid input(s): "LABALBU" Lab Results  Component Value Date   CKTOTAL 26 12/25/2008   CKMB 0.5 12/25/2008   TROPONINI <0.03 09/20/2018    Lab Results  Component Value Date   CHOL 193 03/11/2017   CHOL 215 (H) 01/07/2015   Lab Results  Component Value Date   HDL 57 03/11/2017   HDL 55.00 01/07/2015   Lab Results  Component Value Date   LDLCALC 76 03/11/2017   Lab Results  Component Value Date   TRIG 301 (H) 03/11/2017   TRIG 279.0 (H) 01/07/2015  Lab Results  Component Value Date   CHOLHDL 3.4 03/11/2017   CHOLHDL 4 01/07/2015   Lab Results  Component Value Date   LDLDIRECT 100.0 01/07/2015      Radiology: No results found.  EKG: SR rate 101 nonspecific ST changes 11/05/22   ASSESSMENT AND PLAN:   SSCP:  atypical ECG with no specific changes. Cath 2018 normal and cardiac MRI no sarcoid. Shared decision making favor *** DM:  Discussed low carb diet.  Target hemoglobin A1c is 6.5 or less.  Continue current  medications. Asthma:  no active wheezing continue ventolin/asterpro history of mild pulmonary sarcoid  Thyroid:  continue synthroid replacement TSH normal  GERD:  weight loss continue prilosec.  Anxiety/Deression on Paxil and buspar  ***  F/U PRN   Signed: Charlton Haws 06/11/2023, 2:06 PM

## 2023-06-15 DIAGNOSIS — M459 Ankylosing spondylitis of unspecified sites in spine: Secondary | ICD-10-CM | POA: Diagnosis not present

## 2023-06-22 ENCOUNTER — Ambulatory Visit: Payer: Medicare Other | Admitting: Cardiovascular Disease

## 2023-07-28 NOTE — Progress Notes (Signed)
CARDIOLOGY CONSULT NOTE       Patient ID: [name] MRN: [id number] DOB/AGE: 1966-02-02 57 y.o.  Referring Physician: Hyman Hopes Primary Physician: Shirlean Mylar, MD Primary Cardiologist: New Reason for Consultation: Chest pain   HPI:  57 y.o. referred by Dr Hyman Hopes for chest pain. History of HTN, hypothyroidism, asthma, sarcoid, seizures, ankylosing spondylitis, migraines, spinal stimulator Chronic dyspnea sees Dr Craige Cotta pulmonary. Asthma Rx with astepro, breo and albuterol.  Sees rheumatology Dr Nickola Major for sarcoidosis with polyarthritis and HLA B27 ankylosing spondylitis. Rx with Simponi and pulmonary sarcoid not active issue   Seen by me April 2018. Normal cardiac MRI with no sarcoid EF 69% Cath in [place] 2018 no obstructive CAD 30% LAD mid stenosis   Media referral indicated chest pain with strong family history of CAD   On statin but forgets to take it at times. 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 Some self limited palpitations at night   Her son married Dr Lonie Peak daughter and living near [place] I was lucky enough to attend the  [employer] at [employer].   ROS All other systems reviewed and negative except as noted above  Past Medical History:  Diagnosis Date   Ankylosing spondylitis (HCC)    Anxiety    Arthritis    "back, hands, neck" (09/18/2014)   Asthma    Atypical chest pain    Chronic back pain    "whole spine"   Chronic bronchitis (HCC)    "get it close to q yr" (09/18/2014)   Chronic pain    Complicated migraine    Depression    Environmental allergies    Erosion of suburethral sling (HCC)    Fibromyalgia    Frequency of urination    GERD (gastroesophageal reflux disease)    Headache    "at least several times/wk" (09/18/2014)   Heart murmur    "when I was a baby"   Herpes simplex type 2  infection    History of angina    HLA B27 positive    Hypertension    Hypothyroidism    IBS (irritable bowel syndrome)    IC (interstitial cystitis)    Lesion of frontal lobe of brain    Migraine    "maybe once/month" (09/18/2014)   Nocturia    NSVD (normal spontaneous vaginal delivery)    x3   Osteoarthritis    Pneumonia    RLS (restless legs syndrome)    Sarcoid    Seizures (HCC)    "when I was a teenager"   Shortness of breath dyspnea    Exertion   SUI (stress urinary incontinence, female)    Thyroid disease     Family History  Problem Relation Age of Onset   Diabetes Mother    Hypertension Mother    Heart disease Mother    Cancer Father        LUNG AND BRAIN   Hypertension Father    Heart disease Maternal Grandmother    Cancer Maternal Grandfather        LUNG   Heart disease Paternal Grandmother    Cancer Paternal Grandfather     Social History   Socioeconomic History   Marital status: Married    Spouse name: Not on file   Number of children: Not on file  Years of education: Not on file   Highest education level: Not on file  Occupational History   Occupation: unemployed due to medical reasons  Tobacco Use   Smoking status: Former    Current packs/day: 0.00    Average packs/day: 0.5 packs/day for 15.0 years (7.5 ttl pk-yrs)    Types: Cigarettes    Start date: 01/16/1999    Quit date: 01/15/2014    Years since quitting: 9.5   Smokeless tobacco: Never  Vaping Use   Vaping status: Former  Substance and Sexual Activity   Alcohol use: Not Currently    Comment: few glasses a week   Drug use: Never    Comment: marijuana medicinal patches on her back - 03/18/17 states she no longer uses this   Sexual activity: Not Currently  Other Topics Concern   Not on file  Social History Narrative   ** Merged History Encounter **       Social Determinants of Health   Financial Resource Strain: Not on file  Food Insecurity: Not on file  Transportation Needs: Not  on file  Physical Activity: Not on file  Stress: Not on file  Social Connections: Not on file  Intimate Partner Violence: Not on file    Past Surgical History:  Procedure Laterality Date   ABDOMINAL HYSTERECTOMY  2006   ANTERIOR CERVICAL DECOMP/DISCECTOMY FUSION  2011   APPENDECTOMY  1985   ARTHROPLASTY     Left thumb   BACK SURGERY  2008   X 2 in 2010   BACK SURGERY     CESAREAN SECTION  2000   CRANIECTOMY FOR DEPRESSED SKULL FRACTURE Left 12/05/2014   Procedure: Left frontal stereotactic craniectomy for biopsy of skull lesion;  Surgeon: Lisbeth Renshaw, MD;  Location: [facility];  Service: Neurosurgery;  Laterality: Left;  Left frontal stereotactic craniectomy for biopsy of skull lesion   CYSTO WITH HYDRODISTENSION N/A 11/30/2012   Procedure: CYSTOSCOPY/HYDRODISTENSION;  Surgeon: Valetta Fuller, MD;  Location: [facility];  Service: Urology;  Laterality: N/A;   KNEE ARTHROSCOPY Right    LUMBAR DISC SURGERY  02-02-2007   RIGHT SIDE L5 -- S1   LUMBAR FUSION  12-04-2008   L4  -- S1   LYNX RETROPUBIC SUBURETHRAL SLING  03-02-2007   MASS EXCISION Left 03/19/2017   Procedure: EXCISION LEFT WRIST VOLAR MASS;  Surgeon: Dairl Ponder, MD;  Location[facility];  Service: Orthopedics;  Laterality: Left;   MEDIASTINOSCOPY N/A 10/26/2014   Procedure: MEDIASTINOSCOPY;  Surgeon: Loreli Slot, MD;  Location: [facility];  Service: Thoracic;  Laterality: N/A;   NECK SURGERY     PELVIC LAPAROSCOPY  1990's   LYSIS ADHESIONS   PUBOVAGINAL SLING N/A 11/30/2012   Procedure: Leonides Grills;  Surgeon: Valetta Fuller, MD;  Location[facility];  Service: Urology;  Laterality: N/A;  1 HR EXAM UNDER ANESTHESIA, EXCISION OF SUB URETHRAL MESH, CYSTO, HOD    SPINAL CORD STIMULATOR IMPLANT     TONSILLECTOMY  1986   TUBAL LIGATION  2001   hulka clip   VIDEO BRONCHOSCOPY WITH ENDOBRONCHIAL ULTRASOUND N/A 09/21/2014   Procedure: VIDEO BRONCHOSCOPY WITH ENDOBRONCHIAL  ULTRASOUND;  Surgeon: Leslye Peer, MD;  Location: [facility];  Service: Thoracic;  Laterality: N/A;      Current Outpatient Medications:    albuterol (VENTOLIN HFA) 108 (90 Base) MCG/ACT inhaler, Inhale 2 puffs into the lungs every 6 (six) hours as needed for wheezing or shortness of breath., Disp: 8 g,  Rfl: 6   Azelastine HCl (ASTEPRO) 0.15 % SOLN, Place 1 spray into the nose every morning., Disp: 30 mL, Rfl: 2   busPIRone (BUSPAR) 7.5 MG tablet, Take 1 tablet by mouth 3 (three) times daily as needed., Disp: , Rfl:    celecoxib (CELEBREX) 200 MG capsule, Take 200 mg by mouth 2 (two) times daily as needed for mild pain. , Disp: , Rfl:    fluticasone (FLONASE) 50 MCG/ACT nasal spray, Place 1 spray into both nostrils at bedtime., Disp: 16 g, Rfl: 2   fluticasone furoate-vilanterol (BREO ELLIPTA) 100-25 MCG/ACT AEPB, Inhale 1 puff into the lungs daily., Disp: 30 each, Rfl: 5   gabapentin (NEURONTIN) 400 MG capsule, Take 400 mg by mouth at bedtime., Disp: , Rfl:    Golimumab (SIMPONI [place]), Inject into the skin., Disp: , Rfl:    HYDROmorphone HCl (EXALGO) 8 MG TB24, Take 8 mg by mouth daily. Takes two tablets to equal 16mg , Disp: , Rfl:    levothyroxine (SYNTHROID, LEVOTHROID) 137 MCG tablet, Take 137 mcg by mouth daily before breakfast., Disp: , Rfl:    naloxone (NARCAN) 2 MG/2ML injection, 0.4 mg once. , Disp: , Rfl:    omeprazole (PRILOSEC) 40 MG capsule, Take 40 mg by mouth at bedtime. , Disp: , Rfl:    ondansetron (ZOFRAN) 4 MG tablet, Take 1 tablet (4 mg total) by mouth every 8 (eight) hours as needed for nausea or vomiting., Disp: 20 tablet, Rfl: 0   ondansetron (ZOFRAN) 4 MG tablet, Take 1 tablet (4 mg total) by mouth every 8 (eight) hours as needed for nausea or vomiting., Disp: 20 tablet, Rfl: 0   PARoxetine (PAXIL) 40 MG tablet, Take 40 mg by mouth at bedtime., Disp: , Rfl: 3   promethazine (PHENERGAN) 25 MG tablet, Take 1 tablet by mouth every 12 (twelve) hours as needed for nausea/vomiting.,  Disp: , Rfl:    rOPINIRole (REQUIP) 2 MG tablet, Take 2 mg by mouth at bedtime., Disp: , Rfl:    traMADol (ULTRAM) 50 MG tablet, Take 1 tablet (50 mg total) by mouth every 6 (six) hours as needed., Disp: 15 tablet, Rfl: 0   valACYclovir (VALTREX) 500 MG tablet, Take 500 mg by mouth daily. , Disp: , Rfl:    metFORMIN (GLUCOPHAGE) 500 MG tablet, Take 1 tablet (500 mg total) by mouth 2 (two) times daily with a meal., Disp: 60 tablet, Rfl: 0    Physical Exam: Blood pressure 130/82, pulse 94, height 5' 6.5" (1.689 m), weight 187 lb (84.8 kg), last menstrual period 06/03/2012, SpO2 99%.    Affect appropriate Healthy:  appears stated age HEENT: normal Neck supple with no adenopathy JVP normal no bruits no thyromegaly Lungs clear with no wheezing and good diaphragmatic motion Heart:  S1/S2 no murmur, no rub, gallop or click PMI normal Abdomen: benighn, BS positve, no tenderness, no AAA no bruit.  No HSM or HJR Distal pulses intact with no bruits No edema Neuro non-focal Skin warm and dry No muscular weakness   Labs:   Lab Results  Component Value Date   WBC 12.0 (H) 11/03/2022   HGB 14.2 11/03/2022   HCT 41.1 11/03/2022   MCV 83.9 11/03/2022   PLT 336 11/03/2022   No results for input(s): "NA", "K", "CL", "CO2", "BUN", "CREATININE", "CALCIUM", "PROT", "BILITOT", "ALKPHOS", "ALT", "AST", "GLUCOSE" in the last 168 hours.  Invalid input(s): "LABALBU" Lab Results  Component Value Date   CKTOTAL 26 12/25/2008   CKMB 0.5 12/25/2008   TROPONINI <  0.03 09/20/2018    Lab Results  Component Value Date   CHOL 193 03/11/2017   CHOL 215 (H) 01/07/2015   Lab Results  Component Value Date   HDL 57 03/11/2017   HDL 55.00 01/07/2015   Lab Results  Component Value Date   LDLCALC 76 03/11/2017   Lab Results  Component Value Date   TRIG 301 (H) 03/11/2017   TRIG 279.0 (H) 01/07/2015   Lab Results  Component Value Date   CHOLHDL 3.4 03/11/2017   CHOLHDL 4 01/07/2015   Lab  Results  Component Value Date   LDLDIRECT 100.0 01/07/2015      Radiology: No results found.  EKG: 11/05/22 ST rate 101 nonspecific ST changes     ASSESSMENT AND PLAN:   Chest pain: atypical. Strong family history Cath 2018 non obstructive dx. Shared decision making favor PET/CT to further risk stratify HLD:  not controlled statin compliance issues   Hypothyroid:  on synthroid replacement TSH normal [facility] labs DM:  Discussed low carb diet.  Target hemoglobin A1c is 6.5 or less.  Continue current medications. Rheum:  ankylosing spondylitis on Simponi  Sarcoid: dormant lung dx with some asthma f/u Sood has inhalers no active wheezing on exam.   PET/CT  F/U in a year if testing low risk   Signed: Charlton Haws 08/11/2023, 11:08 AM

## 2023-08-10 DIAGNOSIS — M459 Ankylosing spondylitis of unspecified sites in spine: Secondary | ICD-10-CM | POA: Diagnosis not present

## 2023-08-11 ENCOUNTER — Ambulatory Visit: Payer: Medicare Other | Attending: Cardiovascular Disease | Admitting: Cardiovascular Disease

## 2023-08-11 ENCOUNTER — Encounter: Payer: Self-pay | Admitting: Cardiovascular Disease

## 2023-08-11 VITALS — BP 130/82 | HR 94 | Ht 66.5 in | Wt 187.0 lb

## 2023-08-11 DIAGNOSIS — R072 Precordial pain: Secondary | ICD-10-CM | POA: Diagnosis not present

## 2023-08-11 NOTE — Patient Instructions (Addendum)
Medication Instructions:  Your physician recommends that you continue on your current medications as directed. Please refer to the Current Medication list given to you today.  *If you need a refill on your cardiac medications before your next appointment, please call your pharmacy*  Lab Work: If you have labs (blood work) drawn today and your tests are completely normal, you will receive your results only by: MyChart Message (if you have MyChart) OR A paper copy in the mail If you have any lab test that is abnormal or we need to change your treatment, we will call you to review the results.  Testing/Procedures: Your physician has requested that you have cardiac PET CT. Cardiac computed tomography (CT) is a painless test that uses an x-ray machine to take clear, detailed pictures of your heart. For further information please visit [email or web address] Please follow instruction sheet as given.  Follow-Up: At [facility], you and your health needs are our priority.  As part of our continuing mission to provide you with exceptional heart care, we have created designated Provider Care Teams.  These Care Teams include your primary Cardiologist (physician) and Advanced Practice Providers (APPs -  Physician Assistants and Nurse Practitioners) who all work together to provide you with the care you need, when you need it.  We recommend signing up for the patient portal called "MyChart".  Sign up information is provided on this After Visit Summary.  MyChart is used to connect with patients for Virtual Visits (Telemedicine).  Patients are able to view lab/test results, encounter notes, upcoming appointments, etc.  Non-urgent messages can be sent to your provider as well.   To learn more about what you can do with MyChart, go to [email or web address].    Your next appointment:   12 month(s)  Provider:   Charlton Haws, MD     Other Instructions    Please report to Radiology at the [facility] Main Entrance 30 minutes early for your test.  [address] [place], [place] [address]    How to Prepare for Your Cardiac PET/CT Stress Test:  Nothing to eat or drink, except water, 3 hours prior to arrival time.  NO caffeine/decaffeinated products, or chocolate 12 hours prior to arrival. (Please note decaffeinated beverages (teas/coffees) still contain caffeine).  If you have caffeine within 12 hours prior, the test will need to be rescheduled.  Medication instructions: Do not take nitrates (isosorbide mononitrate, Ranexa) the day before or day of test Do not take tamsulosin the day before or morning of test Hold theophylline containing medications for 12 hours. Hold Dipyridamole 48 hours prior to the test.  Diabetic Preparation: If able to eat breakfast prior to 3 hour fasting, you may take all medications, including your insulin. Do not worry if you miss your breakfast dose of insulin - start at your next meal. If you do not eat prior to 3 hour fast-Hold all diabetes (oral and insulin) medications. Patients who wear a continuous glucose monitor MUST remove the device prior to scanning.  You may take your remaining medications with water.  NO perfume, cologne or lotion on chest or abdomen area. FEMALES - Please avoid wearing dresses to this appointment.  Total time is 1 to 2 hours; you may want to bring reading material for the waiting time.  IF YOU THINK YOU MAY BE PREGNANT, OR ARE NURSING PLEASE INFORM THE TECHNOLOGIST.  In preparation for your appointment, medication and supplies will be purchased.  Appointment availability is limited,  so if you need to cancel or reschedule, please call the Radiology Department at [phone] [facility]) OR [phone] [facility]) 24 hours in advance to avoid a cancellation fee of $100.00  What to Expect When you Arrive:  Once you arrive and check in for your appointment, you will be taken to a preparation room within the  Radiology Department.  A technologist or Nurse will obtain your medical history, verify that you are correctly prepped for the exam, and explain the procedure.  Afterwards, an IV will be started in your arm and electrodes will be placed on your skin for EKG monitoring during the stress portion of the exam. Then you will be escorted to the PET/CT scanner.  There, staff will get you positioned on the scanner and obtain a blood pressure and EKG.  During the exam, you will continue to be connected to the EKG and blood pressure machines.  A small, safe amount of a radioactive tracer will be injected in your IV to obtain a series of pictures of your heart along with an injection of a stress agent.    After your Exam:  It is recommended that you eat a meal and drink a caffeinated beverage to counter act any effects of the stress agent.  Drink plenty of fluids for the remainder of the day and urinate frequently for the first couple of hours after the exam.  Your doctor will inform you of your test results within 7-10 business days.  For more information and frequently asked questions, please visit our website: [email or web address]  For questions about your test or how to prepare for your test, please call: Cardiac Imaging Nurse Navigators Office: [phone]

## 2023-08-24 DIAGNOSIS — R051 Acute cough: Secondary | ICD-10-CM | POA: Diagnosis not present

## 2023-08-24 DIAGNOSIS — E1165 Type 2 diabetes mellitus with hyperglycemia: Secondary | ICD-10-CM | POA: Diagnosis not present

## 2023-08-24 DIAGNOSIS — B338 Other specified viral diseases: Secondary | ICD-10-CM | POA: Diagnosis not present

## 2023-09-15 DIAGNOSIS — G43709 Chronic migraine without aura, not intractable, without status migrainosus: Secondary | ICD-10-CM | POA: Diagnosis not present

## 2023-09-15 DIAGNOSIS — M455 Ankylosing spondylitis of thoracolumbar region: Secondary | ICD-10-CM | POA: Diagnosis not present

## 2023-09-15 DIAGNOSIS — G4733 Obstructive sleep apnea (adult) (pediatric): Secondary | ICD-10-CM | POA: Diagnosis not present

## 2023-09-15 DIAGNOSIS — M961 Postlaminectomy syndrome, not elsewhere classified: Secondary | ICD-10-CM | POA: Diagnosis not present

## 2023-09-15 DIAGNOSIS — M47816 Spondylosis without myelopathy or radiculopathy, lumbar region: Secondary | ICD-10-CM | POA: Diagnosis not present

## 2023-09-15 DIAGNOSIS — Z9689 Presence of other specified functional implants: Secondary | ICD-10-CM | POA: Diagnosis not present

## 2023-09-24 DIAGNOSIS — M47816 Spondylosis without myelopathy or radiculopathy, lumbar region: Secondary | ICD-10-CM | POA: Diagnosis not present

## 2023-09-24 DIAGNOSIS — Z981 Arthrodesis status: Secondary | ICD-10-CM | POA: Diagnosis not present

## 2023-09-24 DIAGNOSIS — M545 Low back pain, unspecified: Secondary | ICD-10-CM | POA: Diagnosis not present

## 2023-09-24 DIAGNOSIS — M533 Sacrococcygeal disorders, not elsewhere classified: Secondary | ICD-10-CM | POA: Diagnosis not present

## 2023-09-24 DIAGNOSIS — M455 Ankylosing spondylitis of thoracolumbar region: Secondary | ICD-10-CM | POA: Diagnosis not present

## 2023-10-06 DIAGNOSIS — M459 Ankylosing spondylitis of unspecified sites in spine: Secondary | ICD-10-CM | POA: Diagnosis not present

## 2023-10-07 DIAGNOSIS — G894 Chronic pain syndrome: Secondary | ICD-10-CM | POA: Diagnosis not present

## 2023-10-07 DIAGNOSIS — E669 Obesity, unspecified: Secondary | ICD-10-CM | POA: Diagnosis not present

## 2023-10-07 DIAGNOSIS — D8689 Sarcoidosis of other sites: Secondary | ICD-10-CM | POA: Diagnosis not present

## 2023-10-07 DIAGNOSIS — M459 Ankylosing spondylitis of unspecified sites in spine: Secondary | ICD-10-CM | POA: Diagnosis not present

## 2023-10-07 DIAGNOSIS — Z79899 Other long term (current) drug therapy: Secondary | ICD-10-CM | POA: Diagnosis not present

## 2023-10-07 DIAGNOSIS — Z1589 Genetic susceptibility to other disease: Secondary | ICD-10-CM | POA: Diagnosis not present

## 2023-10-07 DIAGNOSIS — M0579 Rheumatoid arthritis with rheumatoid factor of multiple sites without organ or systems involvement: Secondary | ICD-10-CM | POA: Diagnosis not present

## 2023-10-07 DIAGNOSIS — Z6831 Body mass index (BMI) 31.0-31.9, adult: Secondary | ICD-10-CM | POA: Diagnosis not present

## 2023-10-08 ENCOUNTER — Other Ambulatory Visit (HOSPITAL_COMMUNITY): Payer: Self-pay | Admitting: *Deleted

## 2023-10-08 DIAGNOSIS — R079 Chest pain, unspecified: Secondary | ICD-10-CM

## 2023-10-11 ENCOUNTER — Telehealth (HOSPITAL_COMMUNITY): Payer: Self-pay | Admitting: *Deleted

## 2023-10-11 NOTE — Telephone Encounter (Signed)
 Reaching out to patient to offer assistance regarding upcoming cardiac imaging study; pt verbalizes understanding of appt date/time, parking situation and where to check in, pre-test NPO status and medications ordered, and verified current allergies; name and call back number provided for further questions should they arise Kerri Peed RN Navigator Cardiac Imaging [facility] [phone] office [phone] cell   Patient aware to avoid caffeine  for 12 hours prior to test.  Patient aware to bring remote for spinal stimulator.

## 2023-10-12 ENCOUNTER — Encounter (HOSPITAL_COMMUNITY)
Admission: RE | Admit: 2023-10-12 | Discharge: 2023-10-12 | Disposition: A | Discharge status: Home / Self Care | Payer: Medicare Other | Source: Ambulatory Visit | Attending: Cardiovascular Disease | Admitting: Cardiovascular Disease

## 2023-10-12 DIAGNOSIS — I700 Atherosclerosis of aorta: Secondary | ICD-10-CM | POA: Diagnosis not present

## 2023-10-12 DIAGNOSIS — I2510 Atherosclerotic heart disease of native coronary artery without angina pectoris: Secondary | ICD-10-CM | POA: Diagnosis not present

## 2023-10-12 DIAGNOSIS — R072 Precordial pain: Secondary | ICD-10-CM | POA: Diagnosis not present

## 2023-10-12 LAB — NM PET CT CARDIAC PERFUSION MULTI W/ABSOLUTE BLOODFLOW
MBFR: 2.2
Nuc Rest EF: 68 %
Nuc Stress EF: 71 %
Rest MBF: 0.96 ml/g/min
Rest Nuclear Isotope Dose: 22.1 mCi
ST Depression (mm): 0 mm
Stress MBF: 2.11 ml/g/min
Stress Nuclear Isotope Dose: 22.3 mCi
TID: 1.03

## 2023-10-12 MED ORDER — RUBIDIUM RB82 GENERATOR (RUBYFILL)
22.1000 | PACK | Freq: Once | INTRAVENOUS | Status: AC
  Administered 2023-10-12: 22.1 via INTRAVENOUS

## 2023-10-12 MED ORDER — REGADENOSON 0.4 MG/5ML IV SOLN
0.4000 mg | Freq: Once | INTRAVENOUS | Status: AC
  Administered 2023-10-12: 0.4 mg via INTRAVENOUS

## 2023-10-12 MED ORDER — REGADENOSON 0.4 MG/5ML IV SOLN
INTRAVENOUS | Status: AC
  Filled 2023-10-12: qty 5

## 2023-10-12 MED ORDER — RUBIDIUM RB82 GENERATOR (RUBYFILL)
22.3000 | PACK | Freq: Once | INTRAVENOUS | Status: AC
  Administered 2023-10-12: 22.3 via INTRAVENOUS

## 2023-10-13 ENCOUNTER — Telehealth: Payer: Self-pay | Admitting: Cardiovascular Disease

## 2023-10-13 DIAGNOSIS — Z1330 Encounter for screening examination for mental health and behavioral disorders, unspecified: Secondary | ICD-10-CM | POA: Diagnosis not present

## 2023-10-13 DIAGNOSIS — E669 Obesity, unspecified: Secondary | ICD-10-CM | POA: Diagnosis not present

## 2023-10-13 DIAGNOSIS — Z8249 Family history of ischemic heart disease and other diseases of the circulatory system: Secondary | ICD-10-CM | POA: Diagnosis not present

## 2023-10-13 DIAGNOSIS — K219 Gastro-esophageal reflux disease without esophagitis: Secondary | ICD-10-CM | POA: Diagnosis not present

## 2023-10-13 DIAGNOSIS — K589 Irritable bowel syndrome without diarrhea: Secondary | ICD-10-CM | POA: Diagnosis not present

## 2023-10-13 DIAGNOSIS — G4733 Obstructive sleep apnea (adult) (pediatric): Secondary | ICD-10-CM | POA: Diagnosis not present

## 2023-10-13 DIAGNOSIS — Z87898 Personal history of other specified conditions: Secondary | ICD-10-CM | POA: Diagnosis not present

## 2023-10-13 DIAGNOSIS — R011 Cardiac murmur, unspecified: Secondary | ICD-10-CM | POA: Diagnosis not present

## 2023-10-13 NOTE — Telephone Encounter (Signed)
Patient was returning call. Please advise ?

## 2023-10-13 NOTE — Telephone Encounter (Signed)
Wendall Stade, MD to Me     10/13/23  8:33 AM Result Note Normal PET/CT   Called patient back with test results.

## 2023-10-27 DIAGNOSIS — F54 Psychological and behavioral factors associated with disorders or diseases classified elsewhere: Secondary | ICD-10-CM | POA: Diagnosis not present

## 2023-10-27 DIAGNOSIS — E669 Obesity, unspecified: Secondary | ICD-10-CM | POA: Diagnosis not present

## 2023-11-03 DIAGNOSIS — G4733 Obstructive sleep apnea (adult) (pediatric): Secondary | ICD-10-CM | POA: Diagnosis not present

## 2023-11-03 DIAGNOSIS — I10 Essential (primary) hypertension: Secondary | ICD-10-CM | POA: Diagnosis not present

## 2023-11-04 ENCOUNTER — Encounter (INDEPENDENT_AMBULATORY_CARE_PROVIDER_SITE_OTHER): Payer: Self-pay | Admitting: Otolaryngology

## 2023-11-10 ENCOUNTER — Emergency Department (HOSPITAL_BASED_OUTPATIENT_CLINIC_OR_DEPARTMENT_OTHER)
Admission: EM | Admit: 2023-11-10 | Discharge: 2023-11-11 | Disposition: A | Discharge status: Home / Self Care | Attending: Emergency Medicine | Admitting: Emergency Medicine

## 2023-11-10 ENCOUNTER — Other Ambulatory Visit: Payer: Self-pay

## 2023-11-10 ENCOUNTER — Encounter: Payer: Self-pay | Admitting: Endocrinology

## 2023-11-10 ENCOUNTER — Ambulatory Visit: Payer: Medicare Other | Admitting: Endocrinology

## 2023-11-10 VITALS — BP 148/98 | HR 99 | Resp 20 | Ht 66.5 in | Wt 198.0 lb

## 2023-11-10 DIAGNOSIS — E1165 Type 2 diabetes mellitus with hyperglycemia: Secondary | ICD-10-CM

## 2023-11-10 DIAGNOSIS — R739 Hyperglycemia, unspecified: Secondary | ICD-10-CM

## 2023-11-10 DIAGNOSIS — Z7985 Long-term (current) use of injectable non-insulin antidiabetic drugs: Secondary | ICD-10-CM | POA: Diagnosis not present

## 2023-11-10 DIAGNOSIS — Z794 Long term (current) use of insulin: Secondary | ICD-10-CM | POA: Diagnosis not present

## 2023-11-10 LAB — BASIC METABOLIC PANEL
Anion gap: 11 (ref 5–15)
BUN: 12 mg/dL (ref 6–20)
CO2: 20 mmol/L — ABNORMAL LOW (ref 22–32)
Calcium: 9.5 mg/dL (ref 8.9–10.3)
Chloride: 100 mmol/L (ref 98–111)
Creatinine, Ser: 0.86 mg/dL (ref 0.44–1.00)
GFR, Estimated: >60 mL/min (ref >60)
Glucose, Bld: 466 mg/dL — ABNORMAL HIGH (ref 70–99)
Potassium: 4 mmol/L (ref 3.5–5.1)
Sodium: 131 mmol/L — ABNORMAL LOW (ref 135–145)

## 2023-11-10 LAB — POCT GLYCOSYLATED HEMOGLOBIN (HGB A1C): Hemoglobin A1C: 9.7 % — AB (ref 4.0–5.6)

## 2023-11-10 LAB — CBC
HCT: 36.9 % (ref 36.0–46.0)
Hemoglobin: 12.5 g/dL (ref 12.0–15.0)
MCH: 28.7 pg (ref 26.0–34.0)
MCHC: 33.9 g/dL (ref 30.0–36.0)
MCV: 84.6 fL (ref 80.0–100.0)
Platelets: 269 10*3/uL (ref 150–400)
RBC: 4.36 MIL/uL (ref 3.87–5.11)
RDW: 13.9 % (ref 11.5–15.5)
WBC: 8.1 10*3/uL (ref 4.0–10.5)
nRBC: 0 % (ref 0.0–0.2)

## 2023-11-10 LAB — CBG MONITORING, ED: Glucose-Capillary: 446 mg/dL — ABNORMAL HIGH (ref 70–99)

## 2023-11-10 MED ORDER — TOUJEO MAX SOLOSTAR 300 UNIT/ML ~~LOC~~ SOPN: 20.0000 [IU] | PEN_INJECTOR | Freq: Every day | SUBCUTANEOUS | 3 refills | Status: DC

## 2023-11-10 MED ORDER — SEMAGLUTIDE(0.25 OR 0.5MG/DOS) 2 MG/3ML ~~LOC~~ SOPN: 0.5000 mg | PEN_INJECTOR | SUBCUTANEOUS | 3 refills | Status: DC

## 2023-11-10 MED ORDER — INSULIN LISPRO (1 UNIT DIAL) 100 UNIT/ML (KWIKPEN): PEN_INJECTOR | SUBCUTANEOUS | 4 refills | Status: DC

## 2023-11-10 NOTE — Progress Notes (Unsigned)
 Outpatient Endocrinology Note [name], MD   Patient's Name: [name]    DOB: 02/07/1966    MRN: [id number]                                                    REASON OF VISIT: New consult for type 2 diabetes mellitus  REFERRING PROVIDER: Camie Patience, FNP  PCP: Camie Patience, FNP  HISTORY OF PRESENT ILLNESS:   [name] is a 58 y.o. old female with past medical history listed below, is here for new consult for type 2 diabetes mellitus.   Pertinent Diabetes History: Patient was diagnosed with type 2 diabetes mellitus in 2020.  Patient has uncontrolled type 2 diabetes mellitus.  No personal history of pancreatitis and / or family history of medullary thyroid carcinoma or MEN 2B syndrome.   Patient reports she may have had acute pancreatitis in March 2024 while evaluated in the ER, with a review of the records in chart CT abdomen at that time was unremarkable pancreas, lipase was less than 10, not elevated, was not mentioned above diagnosis of acute pancreatitis.  Patient reports she has been following with [facility] gastroenterology and reports has exocrine pancreatic insufficiency and takes Creon intermittently.  No records available to review.  Chronic Diabetes Complications : Retinopathy: no. Last ophthalmology exam was done on annually, reportedly, following with ophthalmology regularly.  Nephropathy: no Peripheral neuropathy: no Coronary artery disease: on Stroke: on  Relevant comorbidities and cardiovascular risk factors: Obesity: yes Body mass index is 31.48 kg/m.  Hypertension: Yes  Hyperlipidemia : Yes, on statin   Current / Home Diabetic regimen includes:  Toujeo 8 units daily in the morning.  Humalog sliding scale 10 units 2-3 times a day. Ozempic off for 1 month, was taking 1 mg weekly in the past.   Prior diabetic medications: Metformin regular and extended release, had a GI intolerance and stopped.  She had taken [facility] in the past,  switched to Ozempic.  Glycemic data:    CONTINUOUS GLUCOSE MONITORING SYSTEM (CGMS) INTERPRETATION: At today's visit, we reviewed CGM downloads. The full report is scanned in the media. Reviewing the CGM trends, blood glucose are as follows:  FreeStyle Libre 2+ CGM-  Sensor Download (Sensor download was reviewed and summarized below.) Dates: February 27 to November 10, 2023, 14 days Sensor Average: 302  Glucose Management Indicator: 10.5%  % data captured: 41    Interpretation: -Mostly significant hyperglycemia with blood sugar in the range of 250-400 range, postprandially and sometime overnight related to bedtime snacks.  Occasionally blood sugar trending down to the range of 200s with the use of Humalog.  No hypoglycemia.  Hypoglycemia: Patient has no hypoglycemic episodes. Patient has hypoglycemia awareness.  Factors modifying glucose control: 1.  Diabetic diet assessment: 3 meals a day and sometimes bedtime snack.  2.  Staying active or exercising:   3.  Medication compliance: compliant most of the time.  Interval history  Patient presented to establish type 2 diabetes care.  Hemoglobin A1c 9.7%.  REVIEW OF SYSTEMS As per history of present illness.   PAST MEDICAL HISTORY: Past Medical History:  Diagnosis Date   Ankylosing spondylitis (HCC)    Anxiety    Arthritis    "back, hands, neck" (09/18/2014)   Asthma    Atypical chest pain  Chronic back pain    "whole spine"   Chronic bronchitis (HCC)    "get it close to q yr" (09/18/2014)   Chronic pain    Complicated migraine    Depression    Environmental allergies    Erosion of suburethral sling (HCC)    Fibromyalgia    Frequency of urination    GERD (gastroesophageal reflux disease)    Headache    "at least several times/wk" (09/18/2014)   Heart murmur    "when I was a baby"   Herpes simplex type 2 infection    History of angina    HLA B27 positive    Hypertension    Hypothyroidism    IBS (irritable bowel  syndrome)    IC (interstitial cystitis)    Lesion of frontal lobe of brain    Migraine    "maybe once/month" (09/18/2014)   Nocturia    NSVD (normal spontaneous vaginal delivery)    x3   Osteoarthritis    Pneumonia    RLS (restless legs syndrome)    Sarcoid    Seizures (HCC)    "when I was a teenager"   Shortness of breath dyspnea    Exertion   SUI (stress urinary incontinence, female)    Thyroid disease     PAST SURGICAL HISTORY: Past Surgical History:  Procedure Laterality Date   ABDOMINAL HYSTERECTOMY  2006   ANTERIOR CERVICAL DECOMP/DISCECTOMY FUSION  2011   APPENDECTOMY  1985   ARTHROPLASTY     Left thumb   BACK SURGERY  2008   X 2 in 2010   BACK SURGERY     CESAREAN SECTION  2000   CRANIECTOMY FOR DEPRESSED SKULL FRACTURE Left 12/05/2014   Procedure: Left frontal stereotactic craniectomy for biopsy of skull lesion;  Surgeon: Lisbeth Renshaw, MD;  Location: [facility];  Service: Neurosurgery;  Laterality: Left;  Left frontal stereotactic craniectomy for biopsy of skull lesion   CYSTO WITH HYDRODISTENSION N/A 11/30/2012   Procedure: CYSTOSCOPY/HYDRODISTENSION;  Surgeon: Valetta Fuller, MD;  Location: [facility];  Service: Urology;  Laterality: N/A;   KNEE ARTHROSCOPY Right    LUMBAR DISC SURGERY  02-02-2007   RIGHT SIDE L5 -- S1   LUMBAR FUSION  12-04-2008   L4  -- S1   LYNX RETROPUBIC SUBURETHRAL SLING  03-02-2007   MASS EXCISION Left 03/19/2017   Procedure: EXCISION LEFT WRIST VOLAR MASS;  Surgeon: Dairl Ponder, MD;  Location: [facility] OR;  Service: Orthopedics;  Laterality: Left;   MEDIASTINOSCOPY N/A 10/26/2014   Procedure: MEDIASTINOSCOPY;  Surgeon: Loreli Slot, MD;  [facility] OR;  Service: Thoracic;  Laterality: N/A;   NECK SURGERY     PELVIC LAPAROSCOPY  1990's   LYSIS ADHESIONS   PUBOVAGINAL SLING N/A 11/30/2012   Procedure: Leonides Grills;  Surgeon: Valetta Fuller, MD;  Location: [facility];  Service: Urology;   Laterality: N/A;  1 HR EXAM UNDER ANESTHESIA, EXCISION OF SUB URETHRAL MESH, CYSTO, HOD    SPINAL CORD STIMULATOR IMPLANT     TONSILLECTOMY  1986   TUBAL LIGATION  2001   hulka clip   VIDEO BRONCHOSCOPY WITH ENDOBRONCHIAL ULTRASOUND N/A 09/21/2014   Procedure: VIDEO BRONCHOSCOPY WITH ENDOBRONCHIAL ULTRASOUND;  Surgeon: Leslye Peer, MD;  Location: [facility] OR;  Service: Thoracic;  Laterality: N/A;    ALLERGIES: Allergies  Allergen Reactions   Lyrica [Pregabalin] Anaphylaxis   Topiramate Other (See Comments)    Pt had hallucinations   Humira [Adalimumab] Other (  See Comments)    Muscle weakness   Infliximab Other (See Comments), Itching, Rash and Hives    Other reaction(s): infusion reaction, Other (See Comments)   Percocet [Oxycodone-Acetaminophen] Itching   Sulfamethoxazole-Trimethoprim Other (See Comments)    Fever     Vicodin [Hydrocodone-Acetaminophen] Itching    FAMILY HISTORY:  Family History  Problem Relation Age of Onset   Diabetes Mother    Hypertension Mother    Heart disease Mother    Cancer Father        LUNG AND BRAIN   Hypertension Father    Heart disease Maternal Grandmother    Cancer Maternal Grandfather        LUNG   Heart disease Paternal Grandmother    Cancer Paternal Grandfather     SOCIAL HISTORY: Social History   Socioeconomic History   Marital status: Married    Spouse name: Not on file   Number of children: Not on file   Years of education: Not on file   Highest education level: Not on file  Occupational History   Occupation: unemployed due to medical reasons  Tobacco Use   Smoking status: Former    Current packs/day: 0.00    Average packs/day: 0.5 packs/day for 15.0 years (7.5 ttl pk-yrs)    Types: Cigarettes    Start date: 01/16/1999    Quit date: 01/15/2014    Years since quitting: 9.8   Smokeless tobacco: Never  Vaping Use   Vaping status: Former  Substance and Sexual Activity   Alcohol use: Not Currently    Comment: few glasses  a week   Drug use: Never    Comment: marijuana medicinal patches on her back - 03/18/17 states she no longer uses this   Sexual activity: Not Currently  Other Topics Concern   Not on file  Social History Narrative   ** Merged History Encounter **       Social Drivers of Health   Financial Resource Strain: Low Risk  (10/25/2023)   Received from [facility]   Overall Financial Resource Strain (CARDIA)    Difficulty of Paying Living Expenses: Not hard at all  Food Insecurity: No Food Insecurity (10/25/2023)   Received from [facility]   Hunger Vital Sign    Worried About Running Out of Food in the Last Year: Never true    Ran Out of Food in the Last Year: Never true  Transportation Needs: No Transportation Needs (10/25/2023)   Received from [facility] - Transportation    Lack of Transportation (Medical): No    Lack of Transportation (Non-Medical): No  Physical Activity: Unknown (10/25/2023)   Received from [facility]   Exercise Vital Sign    Days of Exercise per Week: 0 days    Minutes of Exercise per Session: Not on file  Stress: No Stress Concern Present (10/25/2023)   Received from [facility] - Occupational Stress Questionnaire    Feeling of Stress : Not at all  Social Connections: Moderately Integrated (10/25/2023)   Received from [facility]   Social Network    How would you rate your social network (family, work, friends)?: Adequate participation with social networks    MEDICATIONS:  Current Outpatient Medications  Medication Sig Dispense Refill   albuterol (VENTOLIN HFA) 108 (90 Base) MCG/ACT inhaler Inhale 2 puffs into the lungs every 6 (six) hours as needed for wheezing or shortness of breath. 8 g 6   Azelastine HCl (  ASTEPRO) 0.15 % SOLN Place 1 spray into the nose every morning. 30 mL 2   busPIRone (BUSPAR) 7.5 MG tablet Take 1 tablet by mouth 3 (three) times daily as needed.     celecoxib (CELEBREX)  200 MG capsule Take 200 mg by mouth 2 (two) times daily as needed for mild pain.      fluticasone (FLONASE) 50 MCG/ACT nasal spray Place 1 spray into both nostrils at bedtime. 16 g 2   fluticasone furoate-vilanterol (BREO ELLIPTA) 100-25 MCG/ACT AEPB Inhale 1 puff into the lungs daily. 30 each 5   gabapentin (NEURONTIN) 400 MG capsule Take 400 mg by mouth at bedtime.     Golimumab (SIMPONI [place]) Inject into the skin.     HYDROmorphone HCl (EXALGO) 8 MG TB24 Take 8 mg by mouth daily. Takes two tablets to equal 16mg      insulin glargine, 2 Unit Dial, (TOUJEO MAX SOLOSTAR) 300 UNIT/ML Solostar Pen Inject 20 Units into the skin daily. 9 mL 3   insulin lispro (HUMALOG KWIKPEN) 100 UNIT/ML KwikPen Take 10 units with meals plus sliding scale as instructed 3 times a day. 30 mL 4   levothyroxine (SYNTHROID, LEVOTHROID) 137 MCG tablet Take 137 mcg by mouth daily before breakfast.     naloxone (NARCAN) 2 MG/2ML injection 0.4 mg once.      omeprazole (PRILOSEC) 40 MG capsule Take 40 mg by mouth at bedtime.      ondansetron (ZOFRAN) 4 MG tablet Take 1 tablet (4 mg total) by mouth every 8 (eight) hours as needed for nausea or vomiting. 20 tablet 0   ondansetron (ZOFRAN) 4 MG tablet Take 1 tablet (4 mg total) by mouth every 8 (eight) hours as needed for nausea or vomiting. 20 tablet 0   PARoxetine (PAXIL) 40 MG tablet Take 40 mg by mouth at bedtime.  3   promethazine (PHENERGAN) 25 MG tablet Take 1 tablet by mouth every 12 (twelve) hours as needed for nausea/vomiting.     rOPINIRole (REQUIP) 2 MG tablet Take 2 mg by mouth at bedtime.     Semaglutide,0.25 or 0.5MG /DOS, 2 MG/3ML SOPN Inject 0.5 mg into the skin once a week. 9 mL 3   traMADol (ULTRAM) 50 MG tablet Take 1 tablet (50 mg total) by mouth every 6 (six) hours as needed. 15 tablet 0   valACYclovir (VALTREX) 500 MG tablet Take 500 mg by mouth daily.      No current facility-administered medications for this visit.    PHYSICAL EXAM: Vitals:   11/10/23  1317 11/10/23 1319 11/10/23 1408  BP: (!) 162/100 (!) 170/110 (!) 148/98  Pulse: 99    Resp: 20    SpO2: 97%    Weight: 198 lb (89.8 kg)    Height: 5' 6.5" (1.689 m)     Body mass index is 31.48 kg/m.  Wt Readings from Last 3 Encounters:  11/10/23 198 lb (89.8 kg)  11/10/23 198 lb (89.8 kg)  08/11/23 187 lb (84.8 kg)    General: Well developed, well nourished female in no apparent distress.  HEENT: AT/[place], no external lesions.  Eyes: Conjunctiva clear and no icterus. Neck: Neck supple  Lungs: Respirations not labored Neurologic: Alert, oriented, normal speech Extremities / Skin: Dry. No sores or rashes noted.  Psychiatric: Does not appear depressed or anxious  Diabetic Foot Exam - Simple   No data filed     LABS Reviewed Lab Results  Component Value Date   HGBA1C 9.7 (A) 11/10/2023   HGBA1C 5.8 (  H) 03/11/2017   No results found for: "FRUCTOSAMINE" Lab Results  Component Value Date   CHOL 193 03/11/2017   HDL 57 03/11/2017   LDLCALC 76 03/11/2017   LDLDIRECT 100.0 01/07/2015   TRIG 301 (H) 03/11/2017   CHOLHDL 3.4 03/11/2017   No results found for: "MICRALBCREAT" Lab Results  Component Value Date   CREATININE 0.86 11/10/2023   Lab Results  Component Value Date   GFR 67.09 04/26/2020    ASSESSMENT / PLAN  1. Uncontrolled type 2 diabetes mellitus with hyperglycemia (HCC)     Diabetes Mellitus type 2, complicated by no known complications. - Diabetic status / severity: Uncontrolled.  Lab Results  Component Value Date   HGBA1C 9.7 (A) 11/10/2023    - Hemoglobin A1c goal : <6.5%  Discussed about type 2 diabetes mellitus and importance of controlling blood sugar to prevent potential chronic diabetic complications including diabetic retinopathy, neuropathy and nephropathy.  Patient has mostly hyperglycemia.  She needs multidose insulin therapy.  Patient agreed to start mealtime insulin.  Adjusted diabetes regimen as follows.  - Medications: See  below.  Increase Toujeo to 20 units daily in the morning. Take Humalog 5-10 units with meals 3 times a day before eating, plus sliding scale as follows.  Moderate Sliding Scale Blood Glucose        Insulin 60-150                     None 151-200                   3 units 201-250                   5 units 251-300                   7 units 301-350                   9 units 351-400                  11 units    >400                       12 units and call provider  Restart Ozempic 0.5 mg weekly.  - Home glucose testing: CGM and check as needed. - Discussed/ Gave Hypoglycemia treatment plan.  # Consult : not required at this time.   # Annual urine for microalbuminuria/ creatinine ratio, no microalbuminuria currently. Last No results found for: "MICRALBCREAT"  # Foot check nightly.  # Annual dilated diabetic eye exams.   - Diet: Make healthy diabetic food choices - Life style / activity / exercise: Discussed.  2. Blood pressure  -  BP Readings from Last 1 Encounters:  11/11/23 121/85    - Control is in target.  - No change in current plans.  3. Lipid status / Hyperlipidemia - Last  Lab Results  Component Value Date   LDLCALC 76 03/11/2017   - Continue atorvastatin, managed by primary care provider.  # Primary hypothyroidism -Current on levothyroxine, managed by primary care provider.  Diagnoses and all orders for this visit:  Uncontrolled type 2 diabetes mellitus with hyperglycemia (HCC) -     POCT glycosylated hemoglobin (Hb A1C) -     Discontinue: insulin glargine, 2 Unit Dial, (TOUJEO MAX SOLOSTAR) 300 UNIT/ML Solostar Pen; Inject 20 Units into the skin daily. -     Discontinue: insulin lispro (HUMALOG KWIKPEN)  100 UNIT/ML KwikPen; Take 10 units with meals plus sliding scale as instructed 3 times a day. -     Discontinue: Semaglutide,0.25 or 0.5MG /DOS, 2 MG/3ML SOPN; Inject 0.5 mg into the skin once a week. -     insulin glargine, 2 Unit Dial, (TOUJEO MAX  SOLOSTAR) 300 UNIT/ML Solostar Pen; Inject 20 Units into the skin daily. -     Semaglutide,0.25 or 0.5MG /DOS, 2 MG/3ML SOPN; Inject 0.5 mg into the skin once a week. -     insulin lispro (HUMALOG KWIKPEN) 100 UNIT/ML KwikPen; Take 10 units with meals plus sliding scale as instructed 3 times a day.    DISPOSITION Follow up in clinic in 6 weeks suggested.   All questions answered and patient verbalized understanding of the plan.  [place]ra[place] Davan Nawabi, MD [facility] [address] [place], [address] [place], [place] [address] Phone # [phone]  At least part of this note was generated using voice recognition software. Inadvertent word errors may have occurred, which were not recognized during the proofreading process.

## 2023-11-10 NOTE — ED Triage Notes (Signed)
 C/o nausea, headache, and blurred vision since around 2230. Dexcom showed glucose "above 400"

## 2023-11-10 NOTE — Patient Instructions (Addendum)
 Diabetes regimen  Increase Toujeo to 20 units daily in the morning. Take Humalog 5-10 units with meals 3 times a day before eating, plus sliding scale as follows.  Moderate Sliding Scale Blood Glucose        Insulin 60-150                     None 151-200                   3 units 201-250                   5 units 251-300                   7 units 301-350                   9 units 351-400                  11 units    >400                       12 units and call provider  Restart Ozempic 0.5 mg weekly.

## 2023-11-10 NOTE — Progress Notes (Unsigned)
 Patient's BP elevated at 162/100 , rechecked 170/100. Will recheck at the end of visit as advised by MD.

## 2023-11-11 ENCOUNTER — Encounter: Payer: Self-pay | Admitting: Endocrinology

## 2023-11-11 DIAGNOSIS — E1165 Type 2 diabetes mellitus with hyperglycemia: Secondary | ICD-10-CM | POA: Diagnosis not present

## 2023-11-11 LAB — URINALYSIS, ROUTINE W REFLEX MICROSCOPIC
Bacteria, UA: NONE SEEN
Bilirubin Urine: NEGATIVE
Glucose, UA: >1000 mg/dL — AB
Hgb urine dipstick: NEGATIVE
Ketones, ur: NEGATIVE mg/dL
Leukocytes,Ua: NEGATIVE
Nitrite: NEGATIVE
Protein, ur: NEGATIVE mg/dL
Specific Gravity, Urine: 1.026 (ref 1.005–1.030)
pH: 5.5 (ref 5.0–8.0)

## 2023-11-11 LAB — CBG MONITORING, ED: Glucose-Capillary: 150 mg/dL — ABNORMAL HIGH (ref 70–99)

## 2023-11-11 MED ORDER — ACETAMINOPHEN 500 MG PO TABS
1000.0000 mg | ORAL_TABLET | Freq: Once | ORAL | Status: AC
  Administered 2023-11-11: 1000 mg via ORAL
  Filled 2023-11-11: qty 2

## 2023-11-11 MED ORDER — SEMAGLUTIDE(0.25 OR 0.5MG/DOS) 2 MG/3ML ~~LOC~~ SOPN: 0.5000 mg | PEN_INJECTOR | SUBCUTANEOUS | 3 refills | Status: DC

## 2023-11-11 MED ORDER — TOUJEO MAX SOLOSTAR 300 UNIT/ML ~~LOC~~ SOPN: 20.0000 [IU] | PEN_INJECTOR | Freq: Every day | SUBCUTANEOUS | 3 refills | Status: DC

## 2023-11-11 MED ORDER — SODIUM CHLORIDE 0.9 % IV BOLUS
1000.0000 mL | Freq: Once | INTRAVENOUS | Status: AC
  Administered 2023-11-11: 1000 mL via INTRAVENOUS

## 2023-11-11 MED ORDER — INSULIN ASPART 100 UNIT/ML IV SOLN
8.0000 [IU] | Freq: Once | INTRAVENOUS | Status: AC
  Administered 2023-11-11: 8 [IU] via INTRAVENOUS

## 2023-11-11 MED ORDER — INSULIN ASPART 100 UNIT/ML IJ SOLN: 10.0000 [IU] | Freq: Once | INTRAMUSCULAR | Status: DC

## 2023-11-11 MED ORDER — INSULIN LISPRO (1 UNIT DIAL) 100 UNIT/ML (KWIKPEN): PEN_INJECTOR | SUBCUTANEOUS | 4 refills | Status: DC

## 2023-11-11 NOTE — Discharge Instructions (Signed)
 Keep a record of your blood sugars and follow-up with your doctor in the next week to go over the results.  Return to the emergency department if you develop chest pain, abdominal pain, nausea/vomiting, or for persistently elevated sugars.

## 2023-11-11 NOTE — ED Provider Notes (Signed)
 [place] EMERGENCY DEPARTMENT AT [facility] Provider Note   CSN: [id number] Arrival date & time: 11/10/23  2253     History  Chief Complaint  Patient presents with   Hyperglycemia    [name] is a 58 y.o. female.  Patient is a 58 year old female with past medical history of diabetes, hypertension, hypothyroidism, frontal skull lesion.  Patient presenting today with complaints of elevated blood sugar.  She has been checking it throughout the day and getting numbers ranging from 250-400.  She denies any changes in her diet and reports being compliant with her insulin.  She denies any other symptoms other than thirst and urinary frequency.  No alleviating factors.  She did take a dose of NovoLog earlier this evening with little improvement.  The history is provided by the patient.       Home Medications Prior to Admission medications   Medication Sig Start Date End Date Taking? Authorizing Provider  albuterol (VENTOLIN HFA) 108 (90 Base) MCG/ACT inhaler Inhale 2 puffs into the lungs every 6 (six) hours as needed for wheezing or shortness of breath. 12/29/21   Coralyn Helling, MD  Azelastine HCl (ASTEPRO) 0.15 % SOLN Place 1 spray into the nose every morning. 12/29/21   Coralyn Helling, MD  busPIRone (BUSPAR) 7.5 MG tablet Take 1 tablet by mouth 3 (three) times daily as needed. 12/21/21   [provider]  celecoxib (CELEBREX) 200 MG capsule Take 200 mg by mouth 2 (two) times daily as needed for mild pain.  10/24/19   [provider]  fluticasone (FLONASE) 50 MCG/ACT nasal spray Place 1 spray into both nostrils at bedtime. 12/29/21   Coralyn Helling, MD  fluticasone furoate-vilanterol (BREO ELLIPTA) 100-25 MCG/ACT AEPB Inhale 1 puff into the lungs daily. 12/29/21   Coralyn Helling, MD  gabapentin (NEURONTIN) 400 MG capsule Take 400 mg by mouth at bedtime. 01/25/20   [provider]  Golimumab (SIMPONI Benjamin) Inject into the skin.    [provider]   HYDROmorphone HCl (EXALGO) 8 MG TB24 Take 8 mg by mouth daily. Takes two tablets to equal 16mg     [provider]  insulin glargine, 2 Unit Dial, (TOUJEO MAX SOLOSTAR) 300 UNIT/ML Solostar Pen Inject 20 Units into the skin daily. 11/10/23   Thapa, [place], MD  insulin lispro (HUMALOG KWIKPEN) 100 UNIT/ML KwikPen Take 10 units with meals plus sliding scale as instructed 3 times a day. 11/10/23   Thapa, [place], MD  levothyroxine (SYNTHROID, LEVOTHROID) 137 MCG tablet Take 137 mcg by mouth daily before breakfast.    [provider]  metFORMIN (GLUCOPHAGE) 500 MG tablet Take 1 tablet (500 mg total) by mouth 2 (two) times daily with a meal. 10/14/21 11/13/21  Pollyann Savoy, MD  naloxone [facility]) 2 MG/2ML injection 0.4 mg once.  09/05/18   [provider]  omeprazole (PRILOSEC) 40 MG capsule Take 40 mg by mouth at bedtime.     [provider]  ondansetron (ZOFRAN) 4 MG tablet Take 1 tablet (4 mg total) by mouth every 8 (eight) hours as needed for nausea or vomiting. 01/29/17   Aviva Signs, CNM  ondansetron (ZOFRAN) 4 MG tablet Take 1 tablet (4 mg total) by mouth every 8 (eight) hours as needed for nausea or vomiting. 11/03/22   Tegeler, Canary Brim, MD  PARoxetine (PAXIL) 40 MG tablet Take 40 mg by mouth at bedtime. 11/29/15   [provider]  promethazine (PHENERGAN) 25 MG tablet Take 1 tablet by mouth  every 12 (twelve) hours as needed for nausea/vomiting. 01/18/20   [provider]  rOPINIRole (REQUIP) 2 MG tablet Take 2 mg by mouth at bedtime.    [provider]  Semaglutide,0.25 or 0.5MG /DOS, 2 MG/3ML SOPN Inject 0.5 mg into the skin once a week. 11/10/23   Thapa, [place], MD  traMADol (ULTRAM) 50 MG tablet Take 1 tablet (50 mg total) by mouth every 6 (six) hours as needed. 11/03/22   Tegeler, Canary Brim, MD  valACYclovir (VALTREX) 500 MG tablet Take 500 mg by mouth daily.     [provider]      Allergies    Lyrica [pregabalin],  Topiramate, Humira [adalimumab], Infliximab, Percocet [oxycodone-acetaminophen], Sulfamethoxazole-trimethoprim, and Vicodin [hydrocodone-acetaminophen]    Review of Systems   Review of Systems  All other systems reviewed and are negative.   Physical Exam Updated Vital Signs BP (!) 161/85 (BP Location: Right Arm)   Pulse (!) 109   Temp 98 F (36.7 C)   Resp 16   Ht 5\' 6"  (1.676 m)   Wt 89.8 kg   LMP 06/03/2012   SpO2 99%   BMI 31.96 kg/m  Physical Exam Vitals and nursing note reviewed.  Constitutional:      General: She is not in acute distress.    Appearance: She is well-developed. She is not diaphoretic.  HENT:     Head: Normocephalic and atraumatic.  Cardiovascular:     Rate and Rhythm: Normal rate and regular rhythm.     Heart sounds: No murmur heard.    No friction rub. No gallop.  Pulmonary:     Effort: Pulmonary effort is normal. No respiratory distress.     Breath sounds: Normal breath sounds. No wheezing.  Abdominal:     General: Bowel sounds are normal. There is no distension.     Palpations: Abdomen is soft.     Tenderness: There is no abdominal tenderness.  Musculoskeletal:        General: Normal range of motion.     Cervical back: Normal range of motion and neck supple.  Skin:    General: Skin is warm and dry.  Neurological:     General: No focal deficit present.     Mental Status: She is alert and oriented to person, place, and time.    ED Results / Procedures / Treatments   Labs (all labs ordered are listed, but only abnormal results are displayed) Labs Reviewed  BASIC METABOLIC PANEL - Abnormal; Notable for the following components:      Result Value   Sodium 131 (*)    CO2 20 (*)    Glucose, Bld 466 (*)    All other components within normal limits  URINALYSIS, ROUTINE W REFLEX MICROSCOPIC - Abnormal; Notable for the following components:   Color, Urine COLORLESS (*)    Glucose, UA >1,000 (*)    All other components within normal limits   CBG MONITORING, ED - Abnormal; Notable for the following components:   Glucose-Capillary 446 (*)    All other components within normal limits  CBC    EKG None  Radiology No results found.  Procedures Procedures    Medications Ordered in ED Medications  sodium chloride 0.9 % bolus 1,000 mL (has no administration in time range)  insulin aspart (novoLOG) injection 8 Units (has no administration in time range)    ED Course/ Medical Decision Making/ A&P  Patient is a 58 year old female with history of diabetes.  Patient presenting with elevated blood  sugar.  She has had blood sugars as high as 400 throughout the day.  She arrives here with stable vital signs and is afebrile.  Physical examination is unremarkable.  Laboratory studies obtained including CBC, basic metabolic panel.  Blood counts are unremarkable, but glucose is 466 without evidence for DKA.  Patient has been hydrated with normal saline and given IV NovoLog.  Her blood sugars are now 150 and she is feeling improved.  I feel as though patient can safely be discharged with outpatient follow-up.  She is to monitor her sugars closely at home and keep a record of them.  Final Clinical Impression(s) / ED Diagnoses Final diagnoses:  None    Rx / DC Orders ED Discharge Orders     None         Geoffery Lyons, MD 11/11/23 [phone]

## 2023-11-14 ENCOUNTER — Encounter: Payer: Self-pay | Admitting: Endocrinology

## 2023-11-15 MED ORDER — NOVOLOG FLEXPEN 100 UNIT/ML ~~LOC~~ SOPN: PEN_INJECTOR | SUBCUTANEOUS | 11 refills | Status: DC

## 2023-11-15 NOTE — Telephone Encounter (Signed)
 Humalog and NovoLog are about the same.  They can be used interchangeably and works in the same way.  I have sent prescription for NovoLog you can try using that as well.

## 2023-11-17 DIAGNOSIS — J4540 Moderate persistent asthma, uncomplicated: Secondary | ICD-10-CM | POA: Diagnosis not present

## 2023-11-20 DIAGNOSIS — I10 Essential (primary) hypertension: Secondary | ICD-10-CM | POA: Diagnosis not present

## 2023-11-20 DIAGNOSIS — E1122 Type 2 diabetes mellitus with diabetic chronic kidney disease: Secondary | ICD-10-CM | POA: Diagnosis not present

## 2023-11-20 DIAGNOSIS — E1165 Type 2 diabetes mellitus with hyperglycemia: Secondary | ICD-10-CM | POA: Diagnosis not present

## 2023-11-20 DIAGNOSIS — N1831 Chronic kidney disease, stage 3a: Secondary | ICD-10-CM | POA: Diagnosis not present

## 2023-11-22 DIAGNOSIS — E039 Hypothyroidism, unspecified: Secondary | ICD-10-CM | POA: Diagnosis not present

## 2023-11-22 DIAGNOSIS — D869 Sarcoidosis, unspecified: Secondary | ICD-10-CM | POA: Diagnosis not present

## 2023-11-22 DIAGNOSIS — E1165 Type 2 diabetes mellitus with hyperglycemia: Secondary | ICD-10-CM | POA: Diagnosis not present

## 2023-11-22 DIAGNOSIS — I10 Essential (primary) hypertension: Secondary | ICD-10-CM | POA: Diagnosis not present

## 2023-11-22 DIAGNOSIS — G4733 Obstructive sleep apnea (adult) (pediatric): Secondary | ICD-10-CM | POA: Diagnosis not present

## 2023-11-29 DIAGNOSIS — E039 Hypothyroidism, unspecified: Secondary | ICD-10-CM | POA: Diagnosis not present

## 2023-11-29 DIAGNOSIS — I10 Essential (primary) hypertension: Secondary | ICD-10-CM | POA: Diagnosis not present

## 2023-11-29 DIAGNOSIS — E1165 Type 2 diabetes mellitus with hyperglycemia: Secondary | ICD-10-CM | POA: Diagnosis not present

## 2023-11-29 DIAGNOSIS — N1831 Chronic kidney disease, stage 3a: Secondary | ICD-10-CM | POA: Diagnosis not present

## 2023-11-30 DIAGNOSIS — K8681 Exocrine pancreatic insufficiency: Secondary | ICD-10-CM | POA: Diagnosis not present

## 2023-11-30 DIAGNOSIS — Z8619 Personal history of other infectious and parasitic diseases: Secondary | ICD-10-CM | POA: Diagnosis not present

## 2023-11-30 DIAGNOSIS — Z860101 Personal history of adenomatous and serrated colon polyps: Secondary | ICD-10-CM | POA: Diagnosis not present

## 2023-11-30 DIAGNOSIS — K219 Gastro-esophageal reflux disease without esophagitis: Secondary | ICD-10-CM | POA: Diagnosis not present

## 2023-12-01 DIAGNOSIS — R5383 Other fatigue: Secondary | ICD-10-CM | POA: Diagnosis not present

## 2023-12-01 DIAGNOSIS — M459 Ankylosing spondylitis of unspecified sites in spine: Secondary | ICD-10-CM | POA: Diagnosis not present

## 2023-12-01 DIAGNOSIS — Z111 Encounter for screening for respiratory tuberculosis: Secondary | ICD-10-CM | POA: Diagnosis not present

## 2023-12-07 ENCOUNTER — Emergency Department (HOSPITAL_BASED_OUTPATIENT_CLINIC_OR_DEPARTMENT_OTHER)

## 2023-12-07 ENCOUNTER — Other Ambulatory Visit: Payer: Self-pay

## 2023-12-07 ENCOUNTER — Observation Stay (HOSPITAL_BASED_OUTPATIENT_CLINIC_OR_DEPARTMENT_OTHER)
Admission: EM | Admit: 2023-12-07 | Discharge: 2023-12-09 | Disposition: A | Discharge status: Home / Self Care | Attending: Internal Medicine | Admitting: Internal Medicine

## 2023-12-07 ENCOUNTER — Encounter (INDEPENDENT_AMBULATORY_CARE_PROVIDER_SITE_OTHER): Payer: Self-pay

## 2023-12-07 ENCOUNTER — Encounter (HOSPITAL_BASED_OUTPATIENT_CLINIC_OR_DEPARTMENT_OTHER): Payer: Self-pay

## 2023-12-07 ENCOUNTER — Ambulatory Visit (INDEPENDENT_AMBULATORY_CARE_PROVIDER_SITE_OTHER): Admitting: Otolaryngology

## 2023-12-07 VITALS — BP 162/82 | HR 88 | Ht 66.5 in | Wt 201.6 lb

## 2023-12-07 DIAGNOSIS — I10 Essential (primary) hypertension: Secondary | ICD-10-CM | POA: Insufficient documentation

## 2023-12-07 DIAGNOSIS — E66811 Obesity, class 1: Secondary | ICD-10-CM | POA: Diagnosis not present

## 2023-12-07 DIAGNOSIS — R079 Chest pain, unspecified: Secondary | ICD-10-CM

## 2023-12-07 DIAGNOSIS — G43909 Migraine, unspecified, not intractable, without status migrainosus: Secondary | ICD-10-CM | POA: Diagnosis not present

## 2023-12-07 DIAGNOSIS — Z4789 Encounter for other orthopedic aftercare: Secondary | ICD-10-CM | POA: Diagnosis not present

## 2023-12-07 DIAGNOSIS — Z794 Long term (current) use of insulin: Secondary | ICD-10-CM | POA: Insufficient documentation

## 2023-12-07 DIAGNOSIS — R0789 Other chest pain: Secondary | ICD-10-CM | POA: Diagnosis not present

## 2023-12-07 DIAGNOSIS — Z79899 Other long term (current) drug therapy: Secondary | ICD-10-CM | POA: Diagnosis not present

## 2023-12-07 DIAGNOSIS — Z87891 Personal history of nicotine dependence: Secondary | ICD-10-CM | POA: Insufficient documentation

## 2023-12-07 DIAGNOSIS — R519 Headache, unspecified: Secondary | ICD-10-CM

## 2023-12-07 DIAGNOSIS — Z91198 Patient's noncompliance with other medical treatment and regimen for other reason: Secondary | ICD-10-CM | POA: Diagnosis not present

## 2023-12-07 DIAGNOSIS — G4733 Obstructive sleep apnea (adult) (pediatric): Secondary | ICD-10-CM

## 2023-12-07 DIAGNOSIS — R2990 Unspecified symptoms and signs involving the nervous system: Secondary | ICD-10-CM | POA: Diagnosis not present

## 2023-12-07 DIAGNOSIS — F419 Anxiety disorder, unspecified: Secondary | ICD-10-CM | POA: Diagnosis not present

## 2023-12-07 DIAGNOSIS — E1165 Type 2 diabetes mellitus with hyperglycemia: Secondary | ICD-10-CM | POA: Insufficient documentation

## 2023-12-07 DIAGNOSIS — Z981 Arthrodesis status: Secondary | ICD-10-CM | POA: Diagnosis not present

## 2023-12-07 DIAGNOSIS — E039 Hypothyroidism, unspecified: Secondary | ICD-10-CM | POA: Diagnosis not present

## 2023-12-07 DIAGNOSIS — E1142 Type 2 diabetes mellitus with diabetic polyneuropathy: Secondary | ICD-10-CM | POA: Insufficient documentation

## 2023-12-07 DIAGNOSIS — Z7985 Long-term (current) use of injectable non-insulin antidiabetic drugs: Secondary | ICD-10-CM | POA: Diagnosis not present

## 2023-12-07 DIAGNOSIS — R200 Anesthesia of skin: Secondary | ICD-10-CM | POA: Diagnosis not present

## 2023-12-07 DIAGNOSIS — R531 Weakness: Principal | ICD-10-CM

## 2023-12-07 DIAGNOSIS — K219 Gastro-esophageal reflux disease without esophagitis: Secondary | ICD-10-CM | POA: Diagnosis not present

## 2023-12-07 DIAGNOSIS — E785 Hyperlipidemia, unspecified: Secondary | ICD-10-CM | POA: Diagnosis not present

## 2023-12-07 DIAGNOSIS — R29818 Other symptoms and signs involving the nervous system: Secondary | ICD-10-CM | POA: Diagnosis not present

## 2023-12-07 DIAGNOSIS — R262 Difficulty in walking, not elsewhere classified: Secondary | ICD-10-CM | POA: Diagnosis present

## 2023-12-07 DIAGNOSIS — Z789 Other specified health status: Secondary | ICD-10-CM

## 2023-12-07 LAB — CBC WITH DIFFERENTIAL/PLATELET
Abs Immature Granulocytes: 0.04 10*3/uL (ref 0.00–0.07)
Basophils Absolute: 0 10*3/uL (ref 0.0–0.1)
Basophils Relative: 0 %
Eosinophils Absolute: 0.3 10*3/uL (ref 0.0–0.5)
Eosinophils Relative: 3 %
HCT: 35.7 % — ABNORMAL LOW (ref 36.0–46.0)
Hemoglobin: 12.4 g/dL (ref 12.0–15.0)
Immature Granulocytes: 0 %
Lymphocytes Relative: 47 %
Lymphs Abs: 4.1 10*3/uL — ABNORMAL HIGH (ref 0.7–4.0)
MCH: 29.1 pg (ref 26.0–34.0)
MCHC: 34.7 g/dL (ref 30.0–36.0)
MCV: 83.8 fL (ref 80.0–100.0)
Monocytes Absolute: 0.7 10*3/uL (ref 0.1–1.0)
Monocytes Relative: 7 %
Neutro Abs: 3.8 10*3/uL (ref 1.7–7.7)
Neutrophils Relative %: 43 %
Platelets: 265 10*3/uL (ref 150–400)
RBC: 4.26 MIL/uL (ref 3.87–5.11)
RDW: 13.8 % (ref 11.5–15.5)
WBC: 8.9 10*3/uL (ref 4.0–10.5)
nRBC: 0 % (ref 0.0–0.2)

## 2023-12-07 LAB — RAPID URINE DRUG SCREEN, HOSP PERFORMED
Amphetamines: NOT DETECTED
Barbiturates: NOT DETECTED
Benzodiazepines: NOT DETECTED
Cocaine: NOT DETECTED
Opiates: NOT DETECTED
Tetrahydrocannabinol: NOT DETECTED

## 2023-12-07 LAB — COMPREHENSIVE METABOLIC PANEL WITH GFR
ALT: 31 U/L (ref 0–44)
AST: 23 U/L (ref 15–41)
Albumin: 4.3 g/dL (ref 3.5–5.0)
Alkaline Phosphatase: 99 U/L (ref 38–126)
Anion gap: 8 (ref 5–15)
BUN: 12 mg/dL (ref 6–20)
CO2: 25 mmol/L (ref 22–32)
Calcium: 8.8 mg/dL — ABNORMAL LOW (ref 8.9–10.3)
Chloride: 101 mmol/L (ref 98–111)
Creatinine, Ser: 0.84 mg/dL (ref 0.44–1.00)
GFR, Estimated: >60 mL/min (ref >60)
Glucose, Bld: 288 mg/dL — ABNORMAL HIGH (ref 70–99)
Potassium: 4.1 mmol/L (ref 3.5–5.1)
Sodium: 134 mmol/L — ABNORMAL LOW (ref 135–145)
Total Bilirubin: 0.5 mg/dL (ref 0.0–1.2)
Total Protein: 6.5 g/dL (ref 6.5–8.1)

## 2023-12-07 LAB — URINALYSIS, ROUTINE W REFLEX MICROSCOPIC
Bilirubin Urine: NEGATIVE
Glucose, UA: 250 mg/dL — AB
Hgb urine dipstick: NEGATIVE
Ketones, ur: NEGATIVE mg/dL
Leukocytes,Ua: NEGATIVE
Nitrite: NEGATIVE
Protein, ur: NEGATIVE mg/dL
Specific Gravity, Urine: 1.023 (ref 1.005–1.030)
pH: 6 (ref 5.0–8.0)

## 2023-12-07 LAB — TROPONIN I (HIGH SENSITIVITY)
Troponin I (High Sensitivity): 3 ng/L (ref <18)
Troponin I (High Sensitivity): 3 ng/L (ref <18)

## 2023-12-07 LAB — ETHANOL: Alcohol, Ethyl (B): <10 mg/dL (ref <10)

## 2023-12-07 LAB — CBG MONITORING, ED: Glucose-Capillary: 266 mg/dL — ABNORMAL HIGH (ref 70–99)

## 2023-12-07 LAB — PROTIME-INR
INR: 1 (ref 0.8–1.2)
Prothrombin Time: 12.9 s (ref 11.4–15.2)

## 2023-12-07 LAB — APTT: aPTT: 24 s (ref 24–36)

## 2023-12-07 MED ORDER — TENECTEPLASE FOR STROKE: 0.2500 mg/kg | PACK | Freq: Once | INTRAVENOUS | Status: DC

## 2023-12-07 MED ORDER — ROSUVASTATIN CALCIUM 5 MG PO TABS
10.0000 mg | ORAL_TABLET | Freq: Every day | ORAL | Status: DC
  Administered 2023-12-08 – 2023-12-09 (×2): 10 mg via ORAL
  Filled 2023-12-07 (×2): qty 2

## 2023-12-07 MED ORDER — ROPINIROLE HCL 1 MG PO TABS
2.0000 mg | ORAL_TABLET | Freq: Every day | ORAL | Status: DC
  Administered 2023-12-07 – 2023-12-08 (×2): 2 mg via ORAL
  Filled 2023-12-07 (×2): qty 2

## 2023-12-07 MED ORDER — PROCHLORPERAZINE EDISYLATE 10 MG/2ML IJ SOLN
10.0000 mg | Freq: Once | INTRAMUSCULAR | Status: AC
  Administered 2023-12-07: 10 mg via INTRAVENOUS
  Filled 2023-12-07: qty 2

## 2023-12-07 MED ORDER — TENECTEPLASE 50 MG IV KIT
PACK | INTRAVENOUS | Status: AC
  Filled 2023-12-07: qty 10

## 2023-12-07 MED ORDER — GABAPENTIN 400 MG PO CAPS
400.0000 mg | ORAL_CAPSULE | Freq: Every day | ORAL | Status: DC
  Administered 2023-12-07 – 2023-12-08 (×2): 400 mg via ORAL
  Filled 2023-12-07 (×2): qty 1

## 2023-12-07 MED ORDER — PANTOPRAZOLE SODIUM 40 MG PO TBEC
40.0000 mg | DELAYED_RELEASE_TABLET | Freq: Every day | ORAL | Status: DC
  Administered 2023-12-08 – 2023-12-09 (×2): 40 mg via ORAL
  Filled 2023-12-07 (×2): qty 1

## 2023-12-07 MED ORDER — DIPHENHYDRAMINE HCL 50 MG/ML IJ SOLN
25.0000 mg | Freq: Once | INTRAMUSCULAR | Status: AC
  Administered 2023-12-07: 25 mg via INTRAVENOUS
  Filled 2023-12-07: qty 1

## 2023-12-07 MED ORDER — ALTEPLASE 2 MG IJ SOLR
INTRAMUSCULAR | Status: DC
Start: 2023-12-07 — End: 2023-12-07
  Filled 2023-12-07: qty 2

## 2023-12-07 MED ORDER — SODIUM CHLORIDE 0.9% FLUSH
3.0000 mL | Freq: Once | INTRAVENOUS | Status: AC
  Administered 2023-12-07: 3 mL via INTRAVENOUS

## 2023-12-07 MED ORDER — IOHEXOL 350 MG/ML SOLN
100.0000 mL | Freq: Once | INTRAVENOUS | Status: AC | PRN
  Administered 2023-12-07: 100 mL via INTRAVENOUS

## 2023-12-07 MED ORDER — ALTEPLASE 100 MG IV SOLR
INTRAVENOUS | Status: AC
  Filled 2023-12-07: qty 100

## 2023-12-07 MED ORDER — PAROXETINE HCL 20 MG PO TABS
40.0000 mg | ORAL_TABLET | Freq: Every day | ORAL | Status: DC
  Administered 2023-12-07 – 2023-12-08 (×2): 40 mg via ORAL
  Filled 2023-12-07 (×3): qty 2

## 2023-12-07 MED ORDER — VALACYCLOVIR HCL 500 MG PO TABS
500.0000 mg | ORAL_TABLET | Freq: Every day | ORAL | Status: DC
  Administered 2023-12-08 – 2023-12-09 (×2): 500 mg via ORAL
  Filled 2023-12-07 (×2): qty 1

## 2023-12-07 MED ORDER — FLUTICASONE FUROATE-VILANTEROL 100-25 MCG/ACT IN AEPB
1.0000 | INHALATION_SPRAY | Freq: Every day | RESPIRATORY_TRACT | Status: DC
  Administered 2023-12-08 – 2023-12-09 (×2): 1 via RESPIRATORY_TRACT
  Filled 2023-12-07: qty 28

## 2023-12-07 MED ORDER — LEVOTHYROXINE SODIUM 25 MCG PO TABS
137.0000 ug | ORAL_TABLET | Freq: Every day | ORAL | Status: DC
  Administered 2023-12-08 – 2023-12-09 (×2): 137 ug via ORAL
  Filled 2023-12-07 (×2): qty 1

## 2023-12-07 NOTE — ED Notes (Signed)
 Called Thomas at [facility] for hosp consult

## 2023-12-07 NOTE — ED Triage Notes (Signed)
 Patient arrives with complaints of chest pain, elevated BP, and headache with left arm numbness that started at 1230 today.

## 2023-12-07 NOTE — Progress Notes (Signed)
 Admit: Code stroke admit for rule out.  Htn/ Chest pain / left sided weakness.  No tnkase mild symptoms.  EKG: Wnl per edmd. Pt also has had neck surgery.  Pt will be accepted for med tele bed.

## 2023-12-07 NOTE — ED Notes (Signed)
 Attempted to call report to IP unit. Asked to call back in 10 minutes.

## 2023-12-07 NOTE — ED Provider Notes (Signed)
 [place] EMERGENCY DEPARTMENT AT [facility] Provider Note   CSN: [id number] Arrival date & time: 12/07/23  1618     History {Add pertinent medical, surgical, social history, OB history to HPI:1} Chief Complaint  Patient presents with   Chest Pain   Hypertension    [name] is a 58 y.o. female.  HPI      58yo female with history of type 2 diabetes, hypertension, ankylosing spondylitis, sarcoidosis, irritable bowel syndrome, presents with concern for elevated blood pressure, headache, chest pain and left arm weakness.  Reports that her blood pressures were elevated this morning at her primary care doctor's office.  They continue to be increased and at around 1230 she developed symptoms of headache, left-sided weakness, nausea.  Feels like her vision is overall blurred but no specific visual field deficits.  Reports that she has chronic double vision that is not changed. No speech problems. Has felt generally weak walking. Has nausea. Just not feeling right.   She reports she did have similar left-sided symptoms when she was diagnosed with sarcoidosis.  Chart review shows that she had come in as a code stroke in January 2016, had evaluation which showed no evidence of CVA on MRI, but did show a lytic lesion in the skull and was found to have mediastinal lymphadenopathy which was found to be sarcoidosis.    Past Medical History:  Diagnosis Date   Ankylosing spondylitis (HCC)    Anxiety    Arthritis    "back, hands, neck" (09/18/2014)   Asthma    Atypical chest pain    Chronic back pain    "whole spine"   Chronic bronchitis (HCC)    "get it close to q yr" (09/18/2014)   Chronic pain    Complicated migraine    Depression    Environmental allergies    Erosion of suburethral sling (HCC)    Fibromyalgia    Frequency of urination    GERD (gastroesophageal reflux disease)    Headache    "at least several times/wk" (09/18/2014)   Heart murmur    "when I  was a baby"   Herpes simplex type 2 infection    History of angina    HLA B27 positive    Hypertension    Hypothyroidism    IBS (irritable bowel syndrome)    IC (interstitial cystitis)    Lesion of frontal lobe of brain    Migraine    "maybe once/month" (09/18/2014)   Nocturia    NSVD (normal spontaneous vaginal delivery)    x3   Osteoarthritis    Pneumonia    RLS (restless legs syndrome)    Sarcoid    Seizures (HCC)    "when I was a teenager"   Shortness of breath dyspnea    Exertion   SUI (stress urinary incontinence, female)    Thyroid disease     ations Prior to Admission medications   Medication Sig Start Date End Date Taking? Authorizing Provider  albuterol (VENTOLIN HFA) 108 (90 Base) MCG/ACT inhaler Inhale 2 puffs into the lungs every 6 (six) hours as needed for wheezing or shortness of breath. 12/29/21   Coralyn Helling, MD  Azelastine HCl (ASTEPRO) 0.15 % SOLN Place 1 spray into the nose every morning. 12/29/21   Coralyn Helling, MD  busPIRone (BUSPAR) 7.5 MG tablet Take 1 tablet by mouth 3 (three) times daily as needed. 12/21/21   [provider]  celecoxib (CELEBREX) 200 MG capsule Take 200 mg by mouth  2 (two) times daily as needed for mild pain.  10/24/19   [provider]  fluticasone (FLONASE) 50 MCG/ACT nasal spray Place 1 spray into both nostrils at bedtime. 12/29/21   Coralyn Helling, MD  fluticasone furoate-vilanterol (BREO ELLIPTA) 100-25 MCG/ACT AEPB Inhale 1 puff into the lungs daily. 12/29/21   Coralyn Helling, MD  gabapentin (NEURONTIN) 400 MG capsule Take 400 mg by mouth at bedtime. 01/25/20   [provider]  Golimumab (SIMPONI ) Inject into the skin.    [provider]  HYDROmorphone HCl (EXALGO) 8 MG TB24 Take 8 mg by mouth daily. Takes two tablets to equal 16mg     [provider]  insulin aspart (NOVOLOG FLEXPEN) 100 UNIT/ML FlexPen Take 5-10 units plus sliding scale with meals 3 times a day, maximum 40 units/day. 11/15/23    Thapa, [place], MD  insulin glargine, 2 Unit Dial, (TOUJEO MAX SOLOSTAR) 300 UNIT/ML Solostar Pen Inject 20 Units into the skin daily. 11/11/23   Thapa, [place], MD  insulin lispro (HUMALOG KWIKPEN) 100 UNIT/ML KwikPen Take 10 units with meals plus sliding scale as instructed 3 times a day. 11/11/23   Thapa, [place], MD  levothyroxine (SYNTHROID, LEVOTHROID) 137 MCG tablet Take 137 mcg by mouth daily before breakfast.    [provider]  naloxone [facility]) 2 MG/2ML injection 0.4 mg once.  09/05/18   [provider]  omeprazole (PRILOSEC) 40 MG capsule Take 40 mg by mouth at bedtime.     [provider]  ondansetron (ZOFRAN) 4 MG tablet Take 1 tablet (4 mg total) by mouth every 8 (eight) hours as needed for nausea or vomiting. 01/29/17   Aviva Signs, CNM  ondansetron (ZOFRAN) 4 MG tablet Take 1 tablet (4 mg total) by mouth every 8 (eight) hours as needed for nausea or vomiting. 11/03/22   Tegeler, Canary Brim, MD  PARoxetine (PAXIL) 40 MG tablet Take 40 mg by mouth at bedtime. 11/29/15   [provider]  promethazine (PHENERGAN) 25 MG tablet Take 1 tablet by mouth every 12 (twelve) hours as needed for nausea/vomiting. 01/18/20   [provider]  rOPINIRole (REQUIP) 2 MG tablet Take 2 mg by mouth at bedtime.    [provider]  Semaglutide,0.25 or 0.5MG /DOS, 2 MG/3ML SOPN Inject 0.5 mg into the skin once a week. 11/11/23   Thapa, [place], MD  traMADol (ULTRAM) 50 MG tablet Take 1 tablet (50 mg total) by mouth every 6 (six) hours as needed. 11/03/22   Tegeler, Canary Brim, MD  valACYclovir (VALTREX) 500 MG tablet Take 500 mg by mouth daily.     [provider]      Allergies    Lyrica [pregabalin], Topiramate, Humira [adalimumab], Infliximab, Percocet [oxycodone-acetaminophen], Sulfamethoxazole-trimethoprim, and Vicodin [hydrocodone-acetaminophen]    Review of Systems   Review of Systems  Physical Exam Updated Vital Signs BP (!) 158/95 (BP  Location: Right Arm)   Pulse 87   Resp 20   LMP 06/03/2012   SpO2 98%  Physical Exam  ED Results / Procedures / Treatments   Labs (all labs ordered are listed, but only abnormal results are displayed) Labs Reviewed  BASIC METABOLIC PANEL WITH GFR  CBC  TROPONIN I (HIGH SENSITIVITY)    EKG None  Radiology No results found.  Procedures Procedures  {Document cardiac monitor, telemetry assessment procedure when appropriate:1}  Medications Ordered in ED Medications - No data to display  ED Course/ Medical Decision Making/ A&P   {   Click here for ABCD2,  HEART and other calculatorsREFRESH Note before signing :1}                                58yo female with history of type 2 diabetes, hypertension, ankylosing spondylitis, sarcoidosis, irritable bowel syndrome, presents with concern for elevated blood pressure, headache, chest pain and left arm weakness.  In triage initially reported chest symptoms, headache, blood pressures, then stated left arm numbness. On my exam found to have weakness and numbness of the left side and code stroke was called.  CT head completed and evaluated by me without acute abnormalities.     {Document critical care time when appropriate:1} {Document review of labs and clinical decision tools ie heart score, Chads2Vasc2 etc:1}  {Document your independent review of radiology images, and any outside records:1} {Document your discussion with family members, caretakers, and with consultants:1} {Document social determinants of health affecting pt's care:1} {Document your decision making why or why not admission, treatments were needed:1} Final Clinical Impression(s) / ED Diagnoses Final diagnoses:  None    Rx / DC Orders ED Discharge Orders     None

## 2023-12-07 NOTE — Progress Notes (Signed)
 [facility] Telemedicine Consult   Requesting Provider: Alvira Monday Consult Participants: Patient ,bedside and atrium Rns Dr Dalene Seltzer and myself Location of the provider: [facility] stroke center Location of the patient: [facility]  This consult was provided via telemedicine with 2-way video and audio communication. The patient/family was informed that care would be provided in this way and agreed to receive care in this manner.    Chief Complaint: Headache and left-sided weakness  HPI: [name] is a 58 year old lady with past medical history of diabetes, hypertension, hypothyroidism and frontal skull lesion who presented with sudden onset of headache and left-sided weakness which she developed at 1230 today.  Patient felt her blood pressure was quite high and thought this was responsible of symptoms.  When she initially presented she mentioned chest pain to the ED physician.  Code stroke was called because of left-sided weakness.  Patient states she still has significant headache and left-sided weakness may be improving but is still present.    LKW: 12:30 PM today tnk given?: No, patient at the end of treatment.  And deficits to mild last known well not entirely clear IR Thrombectomy? No, clinical presentation not compatible with LVO Modified Rankin Scale: 0-Completely asymptomatic and back to baseline post- stroke Time of teleneurologist evaluation: 1646  Exam: Vitals:   12/07/23 1633 12/07/23 1634  BP:    Pulse:    Resp:    Temp: 98 F (36.7 C)   SpO2:  100%    General: Mildly obese anxious looking middle-aged lady. She is awake alert interactive.  No aphasia apraxia or dysarthria.  Extraocular movements are full range without nystagmus.  Face is symmetric without weakness.  Tongue is midline.  Motor system exam shows no left upper or lower extremity drift but unable to elevate left upper or lower extremity to the same degree as the right but there is no clear  drift.  Fine finger movements appear to be diminished on the left.  She orbits right over left upper extremity.  Finger-to-nose and eatable coordinations are slow but accurate on the left.  Gait not tested 1A: Level of Consciousness - 0 1B: Ask Month and Age - 0 1C: 'Blink Eyes' & 'Squeeze Hands' - 0 2: Test Horizontal Extraocular Movements - 0 3: Test Visual Fields - 0 4: Test Facial Palsy - 0 5A: Test Left Arm Motor Drift - 0 unable to elevate left upper extremity to the same degree as the right side but down  4.  Fine finger movements are diminished on the left.  Orbits right over left upper extremity. 5B: Test Right Arm Motor Drift - 0 6A: Test Left Leg Motor Drift - 0 unable to elevate left leg to the same degree as the right but no drift. 6B: Test Right Leg Motor Drift - 0 7: Test Limb Ataxia - 0 8: Test Sensation - 1 9: Test Language/Aphasia- 0 10: Test Dysarthria - 0 11: Test Extinction/Inattention - 0 NIHSS score: 1   Imaging Reviewed: CT head no acute abnormality.  Aspects score of 10  Labs reviewed in epic and pertinent values follow: Pending at this time   Assessment: 58 year old lady with sudden onset of headache and mild left-sided weakness possibly right small subcortical infarct. Patient has presented towards the end of time window for thrombolysis and has only mild deficits on my exam and her last known well is not quite definite and I believe there may be no clear evidence of benefit over risk of thrombolysis in  her case hence I am not  in favor of giving IV TNK at this time.  I have explained this to the patient and answered questions. Recommendations: Transfer patient to [facility] for admission and further workup for stroke.  Check CT angiogram of the brain and neck.  Check MRI scan later today and if positive for stroke do rest of the stroke workup for   stroke restratification.  Symptomatic treatment for headache as per ER physician. Discussed with ER  physician Dr. Dalene Seltzer answered questions.     This patient is receiving care for possible acute neurological changes. There was 55 minutes of care by this provider at the time of service, including time for direct evaluation via telemedicine, review of medical records, imaging studies and discussion of findings with providers, the patient and/or family.  Sabrina Heady, MD [facility] [phone]  If 7pm- 7am, please page neurology on call as listed in AMION.

## 2023-12-07 NOTE — ED Notes (Signed)
 1640 stroke alert called for pt with c/o HA and left side weakness/sensation changes with a LKW of 1230.  Dr. Dalene Seltzer at bedside with pt.  NIH 1.    1641 Dr. Selina Cooley paged  1645  pt to CT  1647 Dr. Pearlean Brownie on camera.  NIH 1  1701 no TNK given due to outside of time window for administration after evaluation completed.     1705  CTA with perfusion ordered.  Dr. Pearlean Brownie will follow up with Dr. Dalene Seltzer in the ED.

## 2023-12-07 NOTE — ED Notes (Signed)
 Patient left department on stretcher with [facility]

## 2023-12-07 NOTE — Progress Notes (Signed)
 Dear Dr. Cheree Ditto, Here is my assessment for our mutual patient, [name]. Thank you for allowing me the opportunity to care for your patient. Please do not hesitate to contact me should you have any other questions. Sincerely, Dr. Jovita Kussmaul  Otolaryngology Clinic Note Referring provider: Dr. Cheree Ditto HPI:  Shadi Larner is a 58 y.o. female kindly referred by Dr. Cheree Ditto for evaluation of OSA and CPAP intolerance Diagnosed with OSA in 2022.  Initial visit (11/2023):  Using CPAP: not using it currently because of intolerance; reports she pulls it off in her sleep Interventions: Nasal pillow - does not work, tried oral appliance and failed Cannot tolerate currently due to pulling mask off and cannot tolerate it Insomnia: no Chest surgery: no Denies dysphonia, dysphagia Workup so far: Home sleep study, Pulmonary and Sleep Eval  Does have RLS - on ropinorole; does not interfere with sleep  H&N Surgery: Tonsillectomy, ACDF (left side - 2011) Personal or FHx of bleeding dz or anesthesia difficulty: no  GLP-1: Ozempic - yes AP/AC: no  Tobacco: quit. Lives in [place], [place]  PMHx: Sarcoidosis and Asthma, Depression, Fibromyalgia, Ankylosing Spondylitis, RA, Hypothyroidism, Chronic pain (prior on dilaudid), T2DM on insulin  Independent Review of Additional Tests or Records:  Dr. Craige Cotta (Pulm) 11/17/2023: noted asthma and sarcoidosis, some sinus congestion; has OSA with trouble keeping CPAP on; tried oral appliance but did not tolerate; Dx: OSA; Rx: CPAP and follow up for Inspire Spirometry independently reviewed (11/17/2023): FEV 2.64, FEV1% 84 Ignacia [facility]) 11/04/2023 notes reviewed in media tab: CPAP for 6 months, can't adjust; nasal pillows, mild sleepiness while driving; Dx: CPAP intolerance, OSA; Rx: ef ENT Sleep study reviewed (02/05/2023): AHI 23.6, Central apnea index: 6.9    PMH/Meds/All/SocHx/FamHx/ROS:   Past Medical History:  Diagnosis Date   Ankylosing  spondylitis (HCC)    Anxiety    Arthritis    "back, hands, neck" (09/18/2014)   Asthma    Atypical chest pain    Chronic back pain    "whole spine"   Chronic bronchitis (HCC)    "get it close to q yr" (09/18/2014)   Chronic pain    Complicated migraine    Depression    Environmental allergies    Erosion of suburethral sling (HCC)    Fibromyalgia    Frequency of urination    GERD (gastroesophageal reflux disease)    Headache    "at least several times/wk" (09/18/2014)   Heart murmur    "when I was a baby"   Herpes simplex type 2 infection    History of angina    HLA B27 positive    Hypertension    Hypothyroidism    IBS (irritable bowel syndrome)    IC (interstitial cystitis)    Lesion of frontal lobe of brain    Migraine    "maybe once/month" (09/18/2014)   Nocturia    NSVD (normal spontaneous vaginal delivery)    x3   Osteoarthritis    Pneumonia    RLS (restless legs syndrome)    Sarcoid    Seizures (HCC)    "when I was a teenager"   Shortness of breath dyspnea    Exertion   SUI (stress urinary incontinence, female)    Thyroid disease      Past Surgical History:  Procedure Laterality Date   ABDOMINAL HYSTERECTOMY  2006   ANTERIOR CERVICAL DECOMP/DISCECTOMY FUSION  2011   APPENDECTOMY  1985   ARTHROPLASTY     Left thumb   BACK SURGERY  2008   X 2 in 2010   BACK SURGERY     CESAREAN SECTION  2000   CRANIECTOMY FOR DEPRESSED SKULL FRACTURE Left 12/05/2014   Procedure: Left frontal stereotactic craniectomy for biopsy of skull lesion;  Surgeon: Lisbeth Renshaw, MD;  Location: [facility];  Service: Neurosurgery;  Laterality: Left;  Left frontal stereotactic craniectomy for biopsy of skull lesion   CYSTO WITH HYDRODISTENSION N/A 11/30/2012   Procedure: CYSTOSCOPY/HYDRODISTENSION;  Surgeon: Valetta Fuller, MD;  Location: [facility];  Service: Urology;  Laterality: N/A;   KNEE ARTHROSCOPY Right    LUMBAR DISC SURGERY  02-02-2007   RIGHT SIDE L5 -- S1    LUMBAR FUSION  12-04-2008   L4  -- S1   LYNX RETROPUBIC SUBURETHRAL SLING  03-02-2007   MASS EXCISION Left 03/19/2017   Procedure: EXCISION LEFT WRIST VOLAR MASS;  Surgeon: Dairl Ponder, MD;  Location: [facility] OR;  Service: Orthopedics;  Laterality: Left;   MEDIASTINOSCOPY N/A 10/26/2014   Procedure: MEDIASTINOSCOPY;  Surgeon: Loreli Slot, MD;  Location: [facility] OR;  Service: Thoracic;  Laterality: N/A;   NECK SURGERY     PELVIC LAPAROSCOPY  1990's   LYSIS ADHESIONS   PUBOVAGINAL SLING N/A 11/30/2012   Procedure: Leonides Grills;  Surgeon: Valetta Fuller, MD;  Location: [facility];  Service: Urology;  Laterality: N/A;  1 HR EXAM UNDER ANESTHESIA, EXCISION OF SUB URETHRAL MESH, CYSTO, HOD    SPINAL CORD STIMULATOR IMPLANT     TONSILLECTOMY  1986   TUBAL LIGATION  2001   hulka clip   VIDEO BRONCHOSCOPY WITH ENDOBRONCHIAL ULTRASOUND N/A 09/21/2014   Procedure: VIDEO BRONCHOSCOPY WITH ENDOBRONCHIAL ULTRASOUND;  Surgeon: Leslye Peer, MD;  Location: [facility] OR;  Service: Thoracic;  Laterality: N/A;    Family History  Problem Relation Age of Onset   Diabetes Mother    Hypertension Mother    Heart disease Mother    Cancer Father        LUNG AND BRAIN   Hypertension Father    Heart disease Maternal Grandmother    Cancer Maternal Grandfather        LUNG   Heart disease Paternal Grandmother    Cancer Paternal Grandfather      Social Connections: Moderately Integrated (10/25/2023)   Received from [facility]   Social Network    How would you rate your social network (family, work, friends)?: Adequate participation with social networks      Current Outpatient Medications:    albuterol (VENTOLIN HFA) 108 (90 Base) MCG/ACT inhaler, Inhale 2 puffs into the lungs every 6 (six) hours as needed for wheezing or shortness of breath., Disp: 8 g, Rfl: 6   celecoxib (CELEBREX) 200 MG capsule, Take 200 mg by mouth 2 (two) times daily as needed for mild pain. , Disp: , Rfl:     gabapentin (NEURONTIN) 400 MG capsule, Take 400 mg by mouth at bedtime., Disp: , Rfl:    Golimumab (SIMPONI [place]), Inject into the skin., Disp: , Rfl:    insulin aspart (NOVOLOG FLEXPEN) 100 UNIT/ML FlexPen, Take 5-10 units plus sliding scale with meals 3 times a day, maximum 40 units/day., Disp: 15 mL, Rfl: 11   insulin glargine, 2 Unit Dial, (TOUJEO MAX SOLOSTAR) 300 UNIT/ML Solostar Pen, Inject 20 Units into the skin daily., Disp: 9 mL, Rfl: 3   insulin lispro (HUMALOG KWIKPEN) 100 UNIT/ML KwikPen, Take 10 units with meals plus sliding scale as instructed 3 times a day., Disp:  30 mL, Rfl: 4   levothyroxine (SYNTHROID, LEVOTHROID) 137 MCG tablet, Take 137 mcg by mouth daily before breakfast., Disp: , Rfl:    omeprazole (PRILOSEC) 40 MG capsule, Take 40 mg by mouth at bedtime. , Disp: , Rfl:    ondansetron (ZOFRAN) 4 MG tablet, Take 1 tablet (4 mg total) by mouth every 8 (eight) hours as needed for nausea or vomiting., Disp: 20 tablet, Rfl: 0   ondansetron (ZOFRAN) 4 MG tablet, Take 1 tablet (4 mg total) by mouth every 8 (eight) hours as needed for nausea or vomiting., Disp: 20 tablet, Rfl: 0   PARoxetine (PAXIL) 40 MG tablet, Take 40 mg by mouth at bedtime., Disp: , Rfl: 3   promethazine (PHENERGAN) 25 MG tablet, Take 1 tablet by mouth every 12 (twelve) hours as needed for nausea/vomiting., Disp: , Rfl:    rOPINIRole (REQUIP) 2 MG tablet, Take 2 mg by mouth at bedtime., Disp: , Rfl:    Semaglutide,0.25 or 0.5MG /DOS, 2 MG/3ML SOPN, Inject 0.5 mg into the skin once a week., Disp: 9 mL, Rfl: 3   valACYclovir (VALTREX) 500 MG tablet, Take 500 mg by mouth daily. , Disp: , Rfl:    Azelastine HCl (ASTEPRO) 0.15 % SOLN, Place 1 spray into the nose every morning., Disp: 30 mL, Rfl: 2   busPIRone (BUSPAR) 7.5 MG tablet, Take 1 tablet by mouth 3 (three) times daily as needed., Disp: , Rfl:    fluticasone (FLONASE) 50 MCG/ACT nasal spray, Place 1 spray into both nostrils at bedtime., Disp: 16 g, Rfl: 2    fluticasone furoate-vilanterol (BREO ELLIPTA) 100-25 MCG/ACT AEPB, Inhale 1 puff into the lungs daily., Disp: 30 each, Rfl: 5   HYDROmorphone HCl (EXALGO) 8 MG TB24, Take 8 mg by mouth daily. Takes two tablets to equal 16mg , Disp: , Rfl:    naloxone (NARCAN) 2 MG/2ML injection, 0.4 mg once. , Disp: , Rfl:    traMADol (ULTRAM) 50 MG tablet, Take 1 tablet (50 mg total) by mouth every 6 (six) hours as needed., Disp: 15 tablet, Rfl: 0   Physical Exam:   BP (!) 162/82 (BP Location: Right Arm, Patient Position: Sitting, Cuff Size: Large)   Pulse 88   Ht 5' 6.5" (1.689 m)   Wt 201 lb 9.6 oz (91.4 kg)   LMP 06/03/2012   SpO2 94%   BMI 32.05 kg/m   Salient findings:  CN II-XII intact  Bilateral EAC clear and TM intact with well pneumatized middle ear spaces Anterior rhinoscopy: Septum relatively midline; bilateral inferior turbinates with mild hypertrophy No lesions of oral cavity/oropharynx; dentition fair; tonsils absent; Friedman tongue 2 No obviously palpable neck masses/lymphadenopathy/thyromegaly; well healed neck incision No respiratory distress or stridor  Seprately Identifiable Procedures:  Procedure Note Pre-procedure diagnosis:  Obstructive sleep apnea, rule out structural cause Post-procedure diagnosis: Same Procedure: Transnasal Fiberoptic Laryngoscopy, CPT 40981 - Mod 25 Indication: see above Complications: None apparent EBL: 0 mL  The procedure was undertaken to further evaluate complaint above, with mirror exam inadequate for appropriate examination due to gag reflex and poor patient tolerance  Procedure:  Patient was identified as correct patient. Verbal consent was obtained. The nose was sprayed with oxymetazoline and 4% lidocaine. The The flexible laryngoscope was passed through the nose to view the nasal cavity, pharynx (oropharynx, hypopharynx) and larynx.  The larynx was examined at rest and during multiple phonatory tasks. Documentation was obtained and reviewed with  patient. The scope was removed. The patient tolerated the procedure well.  Findings:  The nasal cavity and nasopharynx did not reveal any masses or lesions, mucosa appeared to be without obvious lesions. The tongue base, pharyngeal walls, piriform sinuses, vallecula, epiglottis and postcricoid region are normal in appearance; Muller maneuver negative. The visualized portion of the subglottis and proximal trachea is widely patent. The vocal folds are mobile bilaterally. There are no lesions on the free edge of the vocal folds nor elsewhere in the larynx worrisome for malignancy.    Electronically signed by: Read Drivers, MD 12/07/2023 9:13 AM   Impression & Plans:  Margree Gimbel is a 58 y.o. female with:  1. OSA (obstructive sleep apnea)   2. Intolerance of continuous positive airway pressure (CPAP) ventilation    We discussed options including continued CPAP v/s Inspire. Currently she is a candidate based on BMI but not based on % of Central sleep events (29%); we discussed that this was on a home sleep test, and would recommend in lab study to confirm that she is NOT a candidate.  We had a discussion regarding risks of Inspire including lack of benefit, persistent symptoms, pneumothorax, tongue soreness or weakness, Floor of mouth numbness, injury to major vessels, hematoma, implant infection, bleeding, scarring, tethering of neck, persistent symptoms, need for further procedures, and risk of anesthesia among others.   She would like to proceed but will get in lab sleep test given Central sleep apnea number  - f/u depending on if she qualifies; she'll forward [place] the results  See below regarding exact medications prescribed this encounter including dosages and route: No orders of the defined types were placed in this encounter.     Thank you for allowing me the opportunity to care for your patient. Please do not hesitate to contact me should you have any other  questions.  Sincerely, Jovita Kussmaul, MD Otolaryngologist (ENT), [facility] Phone: [phone] Fax: [phone]  12/07/2023, 9:13 AM   I have personally spent 49 minutes involved in face-to-face and non-face-to-face activities for this patient on the day of the visit.  Professional time spent excludes any procedures performed but includes the following activities, in addition to those noted in the documentation: preparing to see the patient (review of outside documentation and results), performing a medically appropriate examination,  extensive chart check and counseling, documenting in the electronic health record, independently interpreting results (sleep test).

## 2023-12-07 NOTE — Plan of Care (Signed)

## 2023-12-08 ENCOUNTER — Observation Stay (HOSPITAL_COMMUNITY)

## 2023-12-08 DIAGNOSIS — R531 Weakness: Secondary | ICD-10-CM

## 2023-12-08 DIAGNOSIS — R9082 White matter disease, unspecified: Secondary | ICD-10-CM | POA: Diagnosis not present

## 2023-12-08 DIAGNOSIS — I10 Essential (primary) hypertension: Secondary | ICD-10-CM | POA: Diagnosis not present

## 2023-12-08 LAB — HEMOGLOBIN A1C
Hgb A1c MFr Bld: 10 % — ABNORMAL HIGH (ref 4.8–5.6)
Mean Plasma Glucose: 240.3 mg/dL

## 2023-12-08 LAB — CBC
HCT: 37.5 % (ref 36.0–46.0)
Hemoglobin: 13 g/dL (ref 12.0–15.0)
MCH: 29.1 pg (ref 26.0–34.0)
MCHC: 34.7 g/dL (ref 30.0–36.0)
MCV: 84.1 fL (ref 80.0–100.0)
Platelets: 288 10*3/uL (ref 150–400)
RBC: 4.46 MIL/uL (ref 3.87–5.11)
RDW: 13.9 % (ref 11.5–15.5)
WBC: 10.6 10*3/uL — ABNORMAL HIGH (ref 4.0–10.5)
nRBC: 0 % (ref 0.0–0.2)

## 2023-12-08 LAB — ECHOCARDIOGRAM COMPLETE
Area-P 1/2: 3.17 cm2
Height: 66.5 in
S' Lateral: 2 cm
Weight: 3225.6 [oz_av]

## 2023-12-08 LAB — LIPID PANEL
Cholesterol: 200 mg/dL (ref 0–200)
HDL: 52 mg/dL (ref >40)
LDL Cholesterol: 116 mg/dL — ABNORMAL HIGH (ref 0–99)
Total CHOL/HDL Ratio: 3.8 ratio
Triglycerides: 162 mg/dL — ABNORMAL HIGH (ref <150)
VLDL: 32 mg/dL (ref 0–40)

## 2023-12-08 LAB — GLUCOSE, CAPILLARY
Glucose-Capillary: 242 mg/dL — ABNORMAL HIGH (ref 70–99)
Glucose-Capillary: 246 mg/dL — ABNORMAL HIGH (ref 70–99)
Glucose-Capillary: 214 mg/dL — ABNORMAL HIGH (ref 70–99)
Glucose-Capillary: 322 mg/dL — ABNORMAL HIGH (ref 70–99)
Glucose-Capillary: 172 mg/dL — ABNORMAL HIGH (ref 70–99)

## 2023-12-08 LAB — BASIC METABOLIC PANEL WITH GFR
Anion gap: 10 (ref 5–15)
BUN: 12 mg/dL (ref 6–20)
CO2: 26 mmol/L (ref 22–32)
Calcium: 9.2 mg/dL (ref 8.9–10.3)
Chloride: 104 mmol/L (ref 98–111)
Creatinine, Ser: 0.75 mg/dL (ref 0.44–1.00)
GFR, Estimated: >60 mL/min (ref >60)
Glucose, Bld: 167 mg/dL — ABNORMAL HIGH (ref 70–99)
Potassium: 4.1 mmol/L (ref 3.5–5.1)
Sodium: 140 mmol/L (ref 135–145)

## 2023-12-08 LAB — PHOSPHORUS: Phosphorus: 4.2 mg/dL (ref 2.5–4.6)

## 2023-12-08 LAB — MAGNESIUM: Magnesium: 2 mg/dL (ref 1.7–2.4)

## 2023-12-08 MED ORDER — ENOXAPARIN SODIUM 40 MG/0.4ML IJ SOSY
40.0000 mg | PREFILLED_SYRINGE | Freq: Every day | INTRAMUSCULAR | Status: DC
  Administered 2023-12-08 – 2023-12-09 (×2): 40 mg via SUBCUTANEOUS
  Filled 2023-12-08 (×2): qty 0.4

## 2023-12-08 MED ORDER — HYDROMORPHONE HCL 1 MG/ML IJ SOLN
0.2000 mg | Freq: Once | INTRAMUSCULAR | Status: AC
  Administered 2023-12-08: 0.2 mg via INTRAVENOUS
  Filled 2023-12-08: qty 0.5

## 2023-12-08 MED ORDER — PROCHLORPERAZINE EDISYLATE 10 MG/2ML IJ SOLN
5.0000 mg | Freq: Four times a day (QID) | INTRAMUSCULAR | Status: DC | PRN
  Administered 2023-12-09: 5 mg via INTRAVENOUS
  Filled 2023-12-08: qty 2

## 2023-12-08 MED ORDER — INSULIN ASPART 100 UNIT/ML IJ SOLN
0.0000 [IU] | Freq: Three times a day (TID) | INTRAMUSCULAR | Status: DC
  Administered 2023-12-08: 7 [IU] via SUBCUTANEOUS
  Administered 2023-12-08 (×2): 3 [IU] via SUBCUTANEOUS
  Administered 2023-12-09: 1 [IU] via SUBCUTANEOUS
  Administered 2023-12-09: 2 [IU] via SUBCUTANEOUS

## 2023-12-08 MED ORDER — IOHEXOL 350 MG/ML SOLN
75.0000 mL | Freq: Once | INTRAVENOUS | Status: AC | PRN
Start: 2023-12-08 — End: 2023-12-08
  Administered 2023-12-08: 75 mL via INTRAVENOUS

## 2023-12-08 MED ORDER — INSULIN ASPART 100 UNIT/ML IJ SOLN
0.0000 [IU] | Freq: Every day | INTRAMUSCULAR | Status: DC
  Administered 2023-12-08: 2 [IU] via SUBCUTANEOUS

## 2023-12-08 MED ORDER — STROKE: EARLY STAGES OF RECOVERY BOOK
Freq: Once | Status: AC
  Filled 2023-12-08: qty 1

## 2023-12-08 MED ORDER — ACETAMINOPHEN 325 MG PO TABS
650.0000 mg | ORAL_TABLET | Freq: Four times a day (QID) | ORAL | Status: DC | PRN
  Administered 2023-12-08: 650 mg via ORAL
  Filled 2023-12-08: qty 2

## 2023-12-08 MED ORDER — POLYETHYLENE GLYCOL 3350 17 G PO PACK: 17.0000 g | PACK | Freq: Every day | ORAL | Status: DC | PRN

## 2023-12-08 MED ORDER — OXYCODONE HCL 5 MG PO TABS
5.0000 mg | ORAL_TABLET | Freq: Four times a day (QID) | ORAL | Status: DC | PRN
  Administered 2023-12-08 – 2023-12-09 (×2): 5 mg via ORAL
  Filled 2023-12-08 (×2): qty 1

## 2023-12-08 MED ORDER — LABETALOL HCL 5 MG/ML IV SOLN: 5.0000 mg | INTRAVENOUS | Status: DC | PRN

## 2023-12-08 MED ORDER — DIPHENHYDRAMINE HCL 50 MG/ML IJ SOLN: 12.5000 mg | Freq: Four times a day (QID) | INTRAMUSCULAR | Status: DC | PRN

## 2023-12-08 MED ORDER — INSULIN GLARGINE-YFGN 100 UNIT/ML ~~LOC~~ SOLN
5.0000 [IU] | Freq: Two times a day (BID) | SUBCUTANEOUS | Status: DC
  Administered 2023-12-08 – 2023-12-09 (×4): 5 [IU] via SUBCUTANEOUS
  Filled 2023-12-08 (×5): qty 0.05

## 2023-12-08 MED ORDER — ASPIRIN 81 MG PO TBEC
81.0000 mg | DELAYED_RELEASE_TABLET | Freq: Every day | ORAL | Status: DC
  Administered 2023-12-08 – 2023-12-09 (×2): 81 mg via ORAL
  Filled 2023-12-08 (×2): qty 1

## 2023-12-08 MED ORDER — MELATONIN 5 MG PO TABS: 5.0000 mg | ORAL_TABLET | Freq: Every evening | ORAL | Status: DC | PRN

## 2023-12-08 MED ORDER — PERFLUTREN LIPID MICROSPHERE
1.0000 mL | INTRAVENOUS | Status: AC | PRN
Start: 2023-12-08 — End: 2023-12-08
  Administered 2023-12-08: 5 mL via INTRAVENOUS

## 2023-12-08 MED ORDER — LACTATED RINGERS IV SOLN: INTRAVENOUS | Status: AC

## 2023-12-08 MED ORDER — HYDROMORPHONE HCL 1 MG/ML IJ SOLN
0.5000 mg | Freq: Once | INTRAMUSCULAR | Status: AC
  Administered 2023-12-08: 0.5 mg via INTRAVENOUS
  Filled 2023-12-08: qty 0.5

## 2023-12-08 NOTE — Evaluation (Signed)
 Physical Therapy Evaluation Patient Details Name: [name] MRN: [id number] DOB: March 28, 1966 Today's Date: 12/08/2023  History of Present Illness  58 yo female who presents to [facility] from [facility] on 12/07/23 with c/o headaches and L sided weakness. CT negative for acute abnormalities. PMH including chronic back pain and cervical post laminectomy syndrome with spinal stimulator, hx of lumbar fusion, HTN, migraine, asthma, hypothyroidism.  Clinical Impression  Pt is presenting below baseline level of functioning. Pt reports that she has been falling to the L for a while. She has a RW at home which she was encouraged to use in order to prevent falls and injury. Pt denies vertigo; including vertigo with transitions. Currently pt requires CGA for sit to stand, CGA to Min A for gait with unilateral UE support and CGA for stairs with bil rail. Pt is at a high risk for falls. Due to pt current functional status, home set up and available assistance at home recommending skilled physical therapy services 3x/week in order to address strength, balance and functional mobility to decrease risk for falls, injury and re-hospitalization.         If plan is discharge home, recommend the following: A little help with walking and/or transfers;Help with stairs or ramp for entrance;Assist for transportation;Assistance with cooking/housework     Equipment Recommendations None recommended by PT     Functional Status Assessment Patient has had a recent decline in their functional status and demonstrates the ability to make significant improvements in function in a reasonable and predictable amount of time.     Precautions / Restrictions Precautions Precautions: Fall Recall of Precautions/Restrictions: Intact Restrictions Weight Bearing Restrictions Per Provider Order: No      Mobility  Bed Mobility Overal bed mobility: Modified Independent             General bed mobility comments: slight increase  in time.    Transfers Overall transfer level: Needs assistance Equipment used: None Transfers: Sit to/from Stand Sit to Stand: Contact guard assist           General transfer comment: CGA for stability, pt grabbing for bed foot plate and IV pole for balance.    Ambulation/Gait Ambulation/Gait assistance: Contact guard assist, Min assist Gait Distance (Feet): 150 Feet Assistive device: IV Pole Gait Pattern/deviations: Step-through pattern, Decreased step length - right, Decreased step length - left Gait velocity: decreased Gait velocity interpretation: <1.8 ft/sec, indicate of risk for recurrent falls   General Gait Details: Short steps, with uneven step length and foot placement between wide and regular BOS. 3x LOB to the L requiring MIn A to prevent fall using IV pole for ambulation.  Stairs Stairs: Yes Stairs assistance: Contact guard assist Stair Management: Two rails, Alternating pattern, Forwards Number of Stairs: 2 General stair comments: Pt requires CGA for balance during stairs. With sturdy support pt is improve with mobility from gait with IV pole.     Balance Overall balance assessment: Needs assistance Sitting-balance support: Bilateral upper extremity supported, Feet supported Sitting balance-Leahy Scale: Good     Standing balance support: Single extremity supported, During functional activity, Reliant on assistive device for balance Standing balance-Leahy Scale: Poor Standing balance comment: 3x LOB to the L requires MIn A to maintain balance.         Pertinent Vitals/Pain Pain Assessment Pain Assessment: 0-10 Pain Score: 5  Pain Location: head Pain Descriptors / Indicators: Headache Pain Intervention(s): Monitored during session, Patient requesting pain meds-RN notified    Home Living Family/patient expects  to be discharged to:: Private residence Living Arrangements: Spouse/significant other;Children Available Help at Discharge: Family;Available  PRN/intermittently Type of Home: House Home Access: Stairs to enter Entrance Stairs-Rails: Can reach both;Right;Left Entrance Stairs-Number of Steps: 3-4   Home Layout: One level Home Equipment: Grab bars - tub/shower;Grab bars - toilet;Shower seat - built Charity fundraiser (2 wheels);Wheelchair - manual      Prior Function Prior Level of Function : Independent/Modified Independent;Driving             Mobility Comments: Ind no AD ADLs Comments: ind     Extremity/Trunk Assessment   Upper Extremity Assessment Upper Extremity Assessment: Defer to OT evaluation;LUE deficits/detail LUE Deficits / Details: grossly 4-/5 throughout    Lower Extremity Assessment Lower Extremity Assessment: LLE deficits/detail;Overall WFL for tasks assessed LLE Deficits / Details: 4-/5 L knee extension, flexion, 4+/5 DF, 4/5 hip flexion    Cervical / Trunk Assessment Cervical / Trunk Assessment: Normal  Communication   Communication Communication: No apparent difficulties    Cognition Arousal: Alert Behavior During Therapy: WFL for tasks assessed/performed   PT - Cognitive impairments: No apparent impairments     Following commands: Intact       Cueing Cueing Techniques: Verbal cues     General Comments General comments (skin integrity, edema, etc.): No noted skin issues. Pt demonstrated no signs/symptoms of cardiac/respiratory distress throughout session.        Assessment/Plan    PT Assessment Patient needs continued PT services  PT Problem List Decreased balance;Decreased mobility;Decreased safety awareness       PT Treatment Interventions DME instruction;Functional mobility training;Gait training;Therapeutic activities;Stair training;Therapeutic exercise;Neuromuscular re-education;Balance training;Patient/family education    PT Goals (Current goals can be found in the Care Plan section)  Acute Rehab PT Goals Patient Stated Goal: Improve mobility, avoid falls PT Goal  Formulation: With patient Time For Goal Achievement: 12/22/23 Potential to Achieve Goals: Good    Frequency Min 2X/week        AM-PAC PT "6 Clicks" Mobility  Outcome Measure Help needed turning from your back to your side while in a flat bed without using bedrails?: None Help needed moving from lying on your back to sitting on the side of a flat bed without using bedrails?: None Help needed moving to and from a bed to a chair (including a wheelchair)?: A Little Help needed standing up from a chair using your arms (e.g., wheelchair or bedside chair)?: A Little Help needed to walk in hospital room?: A Little Help needed climbing 3-5 steps with a railing? : A Little 6 Click Score: 20    End of Session Equipment Utilized During Treatment: Gait belt Activity Tolerance: Patient tolerated treatment well;Patient limited by pain Patient left: in bed;with bed alarm set;with call bell/phone within reach Nurse Communication: Mobility status PT Visit Diagnosis: Unsteadiness on feet (R26.81);Other abnormalities of gait and mobility (R26.89)    Time: 1027-2536 PT Time Calculation (min) (ACUTE ONLY): 18 min   Charges:   PT Evaluation $PT Eval Low Complexity: 1 Low   PT General Charges $$ ACUTE PT VISIT: 1 Visit        Harrel Carina, DPT, CLT  Acute Rehabilitation Services Office: [phone] (Secure chat preferred)]  Claudia Desanctis 12/08/2023, 10:46 AM

## 2023-12-08 NOTE — Progress Notes (Signed)
 Same day note   [name] is a 58 y.o. female with medical history significant for obesity, type 2 diabetes, hypertension, hypothyroidism, chronic back pain status post spinal stimulator placement, presented initially to [facility] ER with complaints of sudden onset headache and left-sided weakness. Symptoms improved after arrival to the ER but were still present.  In the ED patient had slightly elevated blood pressure with mild tachycardia.  EKG showed normal sinus rhythm.  Chest x-ray was nonacute.  Labs showed mild hyponatremia with sodium of 134.  Patient was seen by teleneurologist and recommended further workup and admission.  CTA of the head and neck without any large vessel occlusion.  Patient was then admitted to [facility].  Patient seen and examined at bedside.  Patient was admitted to the hospital for left-sided weakness and headache.  At the time of my evaluation, patient complains of mild headache today.  Has a history of migraines  Physical examination reveals obese female, no focal deficits at this time  Laboratory data and imaging was reviewed  Assessment and Plan.  Left-sided weakness with concern for possible TIA versus CVA CT angiogram of the head and neck with no evidence for large vessel occlusion.    Check 2D echocardiogram.  Lipid profile with LDL of 116.  Recent hemoglobin A1c from month back was 9.7.  Neurology has been consulted and following.  Unlikely to be able to do MRI of the brain due to stimulator.  Plan for repeat CT scan after 24 hours if unable to do MRI of the brain..  Continue PT OT speech therapy evaluation.   Hyperlipidemia Resume home Crestor.  LDL of 116.   Type 2 diabetes with hyperglycemia Continue basal insulin and short acting insulin, recent hemoglobin A1c from month back was 9.7.   Diabetic polyneuropathy Continue gabapentin   Hypothyroidism Continue Synthroid   Class I obesity There is no height or weight on file to  calculate BMI.  Patient would benefit from ongoing weight loss as outpatient.   Chronic anxiety/depression Continue Paxil.   GERD Continue pantoprazole.    No Charge  Signed,  Tenny Craw, MD [facility]

## 2023-12-08 NOTE — Evaluation (Signed)
 Occupational Therapy Evaluation Patient Details Name: [name] MRN: [id number] DOB: 04/06/1966 Today's Date: 12/08/2023   History of Present Illness   58 yo female who presents to [facility] from [facility] on 12/07/23 with c/o headaches and L sided weakness. CT negative for acute abnormalities. PMH including chronic back pain and cervical post laminectomy syndrome with spinal stimulator, hx of lumbar fusion, HTN, migraine, asthma, hypothyroidism.     Clinical Impressions Pt admitted for above, PTA pt reports being ind with ADLs/iADLs and ambulating no AD. Pt currently presenting with impaired vision to her L field to which she notes as blurry compared to her baseline. Pt also demonstrating some L inattention with impaired LUE coordination and sensation to light touch. Pt needing CGA no AD for mobility in room and CGA to setup A for ADLs. OT to continue following pt acutely to address listed deficits and educate pt on visual strategies prn. Patient would benefit from post acute Home OT services to help maximize functional independence in natural environment.      If plan is discharge home, recommend the following:   Assistance with cooking/housework;Assist for transportation     Functional Status Assessment   Patient has had a recent decline in their functional status and/or demonstrates limited ability to make significant improvements in function in a reasonable and predictable amount of time     Equipment Recommendations   None recommended by OT     Recommendations for Other Services         Precautions/Restrictions   Precautions Precautions: Fall Recall of Precautions/Restrictions: Intact Restrictions Weight Bearing Restrictions Per Provider Order: No     Mobility Bed Mobility Overal bed mobility: Modified Independent                  Transfers Overall transfer level: Needs assistance Equipment used: None Transfers: Sit to/from Stand Sit to Stand:  Contact guard assist                  Balance Overall balance assessment: Needs assistance Sitting-balance support: Feet supported, No upper extremity supported       Standing balance support: Single extremity supported, During functional activity, Reliant on assistive device for balance Standing balance-Leahy Scale: Fair Standing balance comment: ambulatory to bathroom no AD.                           ADL either performed or assessed with clinical judgement   ADL Overall ADL's : Needs assistance/impaired Eating/Feeding: Independent;Sitting   Grooming: Standing;Supervision/safety;Oral care   Upper Body Bathing: Standing;Contact guard assist   Lower Body Bathing: Sitting/lateral leans;Set up   Upper Body Dressing : Sitting;Set up   Lower Body Dressing: Set up;Sitting/lateral leans   Toilet Transfer: Contact guard assist;Ambulation   Toileting- Clothing Manipulation and Hygiene: Contact guard assist;Sit to/from stand       Functional mobility during ADLs: Contact guard assist       Vision Baseline Vision/History: 1 Wears glasses (wears prism glasses for diplopia at baseline) Ability to See in Adequate Light: 1 Impaired Patient Visual Report: Blurring of vision;Diplopia (diplopia aat baseline) Vision Assessment?: Yes Eye Alignment: Within Functional Limits Ocular Range of Motion: Within Functional Limits Alignment/Gaze Preference: Within Defined Limits Tracking/Visual Pursuits: Decreased smoothness of horizontal tracking (decreased smoothness tracking L) Saccades: Other (comment) (NT) Convergence: Impaired (comment) (Pt notes having eye pain with convergence, reports this is baseline. Pt eyes beginning to converge but pt closed eyes towards end of  assessment) Visual Fields: No apparent deficits     Perception Perception: Impaired Preception Impairment Details: Inattention/Neglect Perception-Other Comments: L inattention, may have trouble with this  secondary to vision. Pt not able to identify when therapist is moving L hand   Praxis Praxis: [facility]       Pertinent Vitals/Pain Pain Assessment Pain Assessment: 0-10 Pain Score: 5  Pain Location: head Pain Descriptors / Indicators: Headache Pain Intervention(s): Monitored during session     Extremity/Trunk Assessment Upper Extremity Assessment Upper Extremity Assessment: LUE deficits/detail;Overall [facility] for tasks assessed LUE Deficits / Details: grossly 4-/5 throughout, weak grasp and impaired coordination with rapid alt movement LUE Sensation: decreased light touch (with vision occluded pt not stating any light touch sensation on L side. with vision present pt reports it feels faint)   Lower Extremity Assessment Lower Extremity Assessment: LLE deficits/detail;Overall WFL for tasks assessed LLE Deficits / Details: 4-/5 L knee extension, flexion, 4+/5 DF, 4/5 hip flexion   Cervical / Trunk Assessment Cervical / Trunk Assessment: Normal   Communication Communication Communication: No apparent difficulties   Cognition Arousal: Alert Behavior During Therapy: WFL for tasks assessed/performed Cognition: No apparent impairments                               Following commands: Intact       Cueing  General Comments   Cueing Techniques: Verbal cues  Bp 132/94 sitting EOB at end of session   Exercises     Shoulder Instructions      Home Living Family/patient expects to be discharged to:: Private residence Living Arrangements: Spouse/significant other;Children Available Help at Discharge: Family;Available PRN/intermittently Type of Home: House Home Access: Stairs to enter [employer] of Steps: 3-4 Entrance Stairs-Rails: Can reach both;Right;Left Home Layout: One level     Bathroom Shower/Tub: Walk-in shower (ADA shower)   Bathroom Toilet: Standard     Home Equipment: Grab bars - tub/shower;Grab bars - toilet;Shower seat - built Consulting civil engineer (2 wheels);Wheelchair - manual          Prior Functioning/Environment Prior Level of Function : Independent/Modified Independent;Driving             Mobility Comments: Ind no AD ADLs Comments: ind    OT Problem List: Impaired balance (sitting and/or standing);Impaired vision/perception   OT Treatment/Interventions: Self-care/ADL training;Balance training;Therapeutic exercise;Therapeutic activities;Patient/family education;DME and/or AE instruction;Visual/perceptual remediation/compensation      OT Goals(Current goals can be found in the care plan section)   Acute Rehab OT Goals Patient Stated Goal: To go home OT Goal Formulation: With patient Time For Goal Achievement: 12/22/23 Potential to Achieve Goals: Good ADL Goals Pt/caregiver will Perform Home Exercise Program: Increased strength;Left upper extremity;With written HEP provided (and increased LUE coordination) Additional ADL Goal #1: Pt will verablize/demonstrate understanding of compensatory strategies for visual deficits Additional ADL Goal #2: Pt will demonstrate understanding of compensatory strategies to attend and locate 3/3 objects on L side   OT Frequency:  Min 2X/week    Co-evaluation              AM-PAC OT "6 Clicks" Daily Activity     Outcome Measure Help from another person eating meals?: None Help from another person taking care of personal grooming?: A Little Help from another person toileting, which includes using toliet, bedpan, or urinal?: A Little Help from another person bathing (including washing, rinsing, drying)?: A Little Help from another person to put on and taking  off regular upper body clothing?: A Little Help from another person to put on and taking off regular lower body clothing?: A Little 6 Click Score: 19   End of Session Nurse Communication: Mobility status  Activity Tolerance: Patient tolerated treatment well Patient left: in bed;with call bell/phone within  reach  OT Visit Diagnosis: Unsteadiness on feet (R26.81);Low vision, both eyes (H54.2)                Time: 1610-9604 OT Time Calculation (min): 35 min Charges:  OT General Charges $OT Visit: 1 Visit OT Evaluation $OT Eval Low Complexity: 1 Low OT Treatments $Therapeutic Activity: 8-22 mins  12/08/2023  AB, OTR/L  Acute Rehabilitation Services  Office: [phone]   Tristan Schroeder 12/08/2023, 11:13 AM

## 2023-12-08 NOTE — Progress Notes (Signed)
 STROKE TEAM PROGRESS NOTE :    Subjective : Patient presented yesterday to [facility] with sudden onset of headache and left-sided weakness and numbness.  CT head was unremarkable.  CT angiogram showed no large vessel stenosis or occlusion.  Patient was transferred to [facility] for stroke workup.  MRI scan still pending.  Patient states headache is improving.  She still has some left-sided weakness and numbness which is unchanged.  Vital signs are stable.  Neurological exam :  She is awake alert oriented to time place and person.  Speech and language are normal.  Extraocular movements are full range without nystagmus.  Face is symmetric without weakness.  Tongue is midline motor system exam shows no left upper or lower extremity drift but poor effort and unable to elevate left upper and lower extremity to the same degree as the right.  Subjective diminished touch pinprick and vibration sensation in the left hemibody including before and splits the midline.  Finger-to-nose coordination is accurate.  Gait not tested.   CT head no acute abnormality CT angiogram of brain and neck no large vessel stenosis. LDL cholesterol 160 mg percent.  Triglycerides 162 mg percent. Hemoglobin A1c pending 2D echo pending MRI cannot be done as patient has a spinal cord stimulator.  Will repeat CT head with and without contrast  IMPRESSION : 58 year old lady with strokelike episode with sudden onset of headache with left-sided weakness and numbness with nonorganic features on exam with global weakness and splitting the midline of sensation. MRI cannot be done due to spinal cord stimulator.  Will repeat CT head with and without contrast.  Plan repeat CT head with and without contrast as unable to do MRI.  Mobilize out of bed.  Therapy consults.  Aspirin for stroke prevention. Aggressive risk factor modification. Discussed with patient and answered questions.  Discussed with Dr. Tyson Babinski Greater than 50% time  during this 50-minute visit was spent in counseling and coordination of care and discussion patient care team managing patient.  Delia Heady, MD

## 2023-12-08 NOTE — Progress Notes (Signed)
 Echocardiogram 2D Echocardiogram has been performed.  Warren Lacy Makinze Jani RDCS 12/08/2023, 12:24 PM

## 2023-12-08 NOTE — Care Management Obs Status (Signed)
 MEDICARE OBSERVATION STATUS NOTIFICATION   Patient Details  Name: [name] MRN: [id number] Date of Birth: 01-Oct-1965   Medicare Observation Status Notification Given:  Yes  [place]oon/Obs letter given and signed  Dorena Bodo 12/08/2023, 10:27 AM

## 2023-12-08 NOTE — Progress Notes (Signed)
 SLP Cancellation Note  Patient Details Name: [name] MRN: [id number] DOB: 1966/04/22   Cancelled treatment:         SLP attempted to visit patient to complete cognitive-linguistic evaluation. Patient completing ECHO. SLP to return as patient/therapist schedule allows.    Anaka Beazer F Kitai Purdom 12/08/2023, 2:25 PM

## 2023-12-08 NOTE — H&P (Signed)
 History and Physical  [name] EXB:[id number] DOB: [birth year] DOA: 12/07/2023  Referring physician: Accepted by Dr. Allena Katz, [facility], hospitalist service. PCP: Camie Patience, FNP  Outpatient Specialists: Bariatric, cardiology, neurology, radiology. Patient coming from: Home.  Chief Complaint: Code stroke.  HPI: [name] is a 58 y.o. female with medical history significant for obesity, type 2 diabetes, hypertension, hypothyroidism, chronic back pain status post spinal stimulator placement, who initially presents to [facility] ER with complaints of sudden onset headache and left-sided weakness.  Endorses symptoms started after waking up at 7 AM today.  Symptoms improved after arrival to the ER but were still present.  In the ER, the patient was seen by tele neurohospitalist.  TNK was not administered.  Neurology recommended transfer to [facility] for stroke workup.  CTA head and neck with no evidence of LVO.  ED Course: Temperature 98.8.  BP 160/88, pulse 104, respiratory rate 18, O2 saturation 95% on room air.  Review of Systems: Review of systems as noted in the HPI. All other systems reviewed and are negative.   Past Medical History:  Diagnosis Date   Ankylosing spondylitis (HCC)    Anxiety    Arthritis    "back, hands, neck" (09/18/2014)   Asthma    Atypical chest pain    Chronic back pain    "whole spine"   Chronic bronchitis (HCC)    "get it close to q yr" (09/18/2014)   Chronic pain    Complicated migraine    Depression    Environmental allergies    Erosion of suburethral sling (HCC)    Fibromyalgia    Frequency of urination    GERD (gastroesophageal reflux disease)    Headache    "at least several times/wk" (09/18/2014)   Heart murmur    "when I was a baby"   Herpes simplex type 2 infection    History of angina    HLA B27 positive    Hypertension    Hypothyroidism    IBS (irritable bowel syndrome)    IC (interstitial cystitis)     Lesion of frontal lobe of brain    Migraine    "maybe once/month" (09/18/2014)   Nocturia    NSVD (normal spontaneous vaginal delivery)    x3   Osteoarthritis    Pneumonia    RLS (restless legs syndrome)    Sarcoid    Seizures (HCC)    "when I was a teenager"   Shortness of breath dyspnea    Exertion   SUI (stress urinary incontinence, female)    Thyroid disease    Past Surgical History:  Procedure Laterality Date   ABDOMINAL HYSTERECTOMY  2006   ANTERIOR CERVICAL DECOMP/DISCECTOMY FUSION  2011   APPENDECTOMY  1985   ARTHROPLASTY     Left thumb   BACK SURGERY  2008   X 2 in 2010   BACK SURGERY     CESAREAN SECTION  2000   CRANIECTOMY FOR DEPRESSED SKULL FRACTURE Left 12/05/2014   Procedure: Left frontal stereotactic craniectomy for biopsy of skull lesion;  Surgeon: Lisbeth Renshaw, MD;  Location: [facility];  Service: Neurosurgery;  Laterality: Left;  Left frontal stereotactic craniectomy for biopsy of skull lesion   CYSTO WITH HYDRODISTENSION N/A 11/30/2012   Procedure: CYSTOSCOPY/HYDRODISTENSION;  Surgeon: Valetta Fuller, MD;  Location: [facility];  Service: Urology;  Laterality: N/A;   KNEE ARTHROSCOPY Right    LUMBAR DISC SURGERY  02-02-2007   RIGHT SIDE L5 --  S1   LUMBAR FUSION  12-04-2008   L4  -- S1   LYNX RETROPUBIC SUBURETHRAL SLING  03-02-2007   MASS EXCISION Left 03/19/2017   Procedure: EXCISION LEFT WRIST VOLAR MASS;  Surgeon: Dairl Ponder, MD;  Location: [facility] OR;  Service: Orthopedics;  Laterality: Left;   MEDIASTINOSCOPY N/A 10/26/2014   Procedure: MEDIASTINOSCOPY;  Surgeon: Loreli Slot, MD;  Location: [facility] OR;  Service: Thoracic;  Laterality: N/A;   NECK SURGERY     PELVIC LAPAROSCOPY  1990's   LYSIS ADHESIONS   PUBOVAGINAL SLING N/A 11/30/2012   Procedure: Leonides Grills;  Surgeon: Valetta Fuller, MD;  Location: [facility];  Service: Urology;  Laterality: N/A;  1 HR EXAM UNDER ANESTHESIA, EXCISION OF SUB URETHRAL  MESH, CYSTO, HOD    SPINAL CORD STIMULATOR IMPLANT     TONSILLECTOMY  1986   TUBAL LIGATION  2001   hulka clip   VIDEO BRONCHOSCOPY WITH ENDOBRONCHIAL ULTRASOUND N/A 09/21/2014   Procedure: VIDEO BRONCHOSCOPY WITH ENDOBRONCHIAL ULTRASOUND;  Surgeon: Leslye Peer, MD;  Location: [facility] OR;  Service: Thoracic;  Laterality: N/A;    Social History:  reports that she quit smoking about 9 years ago. Her smoking use included cigarettes. She started smoking about 24 years ago. She has a 7.5 pack-year smoking history. She has never used smokeless tobacco. She reports that she does not currently use alcohol. She reports that she does not use drugs.   Allergies  Allergen Reactions   Lyrica [Pregabalin] Anaphylaxis   Topiramate Other (See Comments)    Pt had hallucinations   Humira [Adalimumab] Other (See Comments)    Muscle weakness   Infliximab Other (See Comments), Itching, Rash and Hives    Other reaction(s): infusion reaction, Other (See Comments)   Percocet [Oxycodone-Acetaminophen] Itching   Sulfamethoxazole-Trimethoprim Other (See Comments)    Fever     Vicodin [Hydrocodone-Acetaminophen] Itching    Family History  Problem Relation Age of Onset   Diabetes Mother    Hypertension Mother    Heart disease Mother    Cancer Father        LUNG AND BRAIN   Hypertension Father    Heart disease Maternal Grandmother    Cancer Maternal Grandfather        LUNG   Heart disease Paternal Grandmother    Cancer Paternal Grandfather       Prior to Admission medications   Medication Sig Start Date End Date Taking? Authorizing Provider  albuterol (VENTOLIN HFA) 108 (90 Base) MCG/ACT inhaler Inhale 2 puffs into the lungs every 6 (six) hours as needed for wheezing or shortness of breath. 12/29/21   Coralyn Helling, MD  Azelastine HCl (ASTEPRO) 0.15 % SOLN Place 1 spray into the nose every morning. 12/29/21   Coralyn Helling, MD  busPIRone (BUSPAR) 7.5 MG tablet Take 1 tablet by mouth 3 (three) times daily  as needed. 12/21/21   [provider]  celecoxib (CELEBREX) 200 MG capsule Take 200 mg by mouth 2 (two) times daily as needed for mild pain.  10/24/19   [provider]  Cholecalciferol (VITAMIN D3) 1.25 MG (50000 UT) CAPS Take 50,000 Units by mouth once a week.    [provider]  fluticasone (FLONASE) 50 MCG/ACT nasal spray Place 1 spray into both nostrils at bedtime. 12/29/21   Coralyn Helling, MD  fluticasone furoate-vilanterol (BREO ELLIPTA) 100-25 MCG/ACT AEPB Inhale 1 puff into the lungs daily. 12/29/21   Coralyn Helling, MD  gabapentin (NEURONTIN) 400 MG capsule  Take 400 mg by mouth at bedtime. 01/25/20   [provider]  Golimumab (SIMPONI [place]) Inject into the skin.    [provider]  HYDROmorphone HCl (EXALGO) 8 MG TB24 Take 8 mg by mouth daily. Takes two tablets to equal 16mg     [provider]  insulin aspart (NOVOLOG FLEXPEN) 100 UNIT/ML FlexPen Take 5-10 units plus sliding scale with meals 3 times a day, maximum 40 units/day. 11/15/23   Thapa, [place], MD  insulin glargine, 2 Unit Dial, (TOUJEO MAX SOLOSTAR) 300 UNIT/ML Solostar Pen Inject 20 Units into the skin daily. 11/11/23   Thapa, [place], MD  insulin lispro (HUMALOG KWIKPEN) 100 UNIT/ML KwikPen Take 10 units with meals plus sliding scale as instructed 3 times a day. 11/11/23   Thapa, [place], MD  levothyroxine (SYNTHROID, LEVOTHROID) 137 MCG tablet Take 137 mcg by mouth daily before breakfast.    [provider]  naloxone [facility]) 2 MG/2ML injection 0.4 mg once.  09/05/18   [provider]  omeprazole (PRILOSEC) 40 MG capsule Take 40 mg by mouth at bedtime.     [provider]  ondansetron (ZOFRAN) 4 MG tablet Take 1 tablet (4 mg total) by mouth every 8 (eight) hours as needed for nausea or vomiting. 01/29/17   Aviva Signs, CNM  ondansetron (ZOFRAN) 4 MG tablet Take 1 tablet (4 mg total) by mouth every 8 (eight) hours as needed for nausea or vomiting. 11/03/22   Tegeler,  Canary Brim, MD  PARoxetine (PAXIL) 40 MG tablet Take 40 mg by mouth at bedtime. 11/29/15   [provider]  promethazine (PHENERGAN) 25 MG tablet Take 1 tablet by mouth every 12 (twelve) hours as needed for nausea/vomiting. 01/18/20   [provider]  rOPINIRole (REQUIP) 2 MG tablet Take 2 mg by mouth at bedtime.    [provider]  rosuvastatin (CRESTOR) 10 MG tablet Take 10 mg by mouth daily.    [provider]  Semaglutide,0.25 or 0.5MG /DOS, 2 MG/3ML SOPN Inject 0.5 mg into the skin once a week. 11/11/23   Thapa, [place], MD  traMADol (ULTRAM) 50 MG tablet Take 1 tablet (50 mg total) by mouth every 6 (six) hours as needed. 11/03/22   Tegeler, Canary Brim, MD  valACYclovir (VALTREX) 500 MG tablet Take 500 mg by mouth daily.     [provider]    Physical Exam: BP (!) 160/88 (BP Location: Left Arm)   Pulse (!) 104   Temp 98.8 F (37.1 C) (Oral)   Resp 18   LMP 06/03/2012   SpO2 95%   General: 58 y.o. year-old female well developed well nourished in no acute distress.  Alert and oriented x3. Cardiovascular: Regular rate and rhythm with no rubs or gallops.  No thyromegaly or JVD noted.  No lower extremity edema. 2/4 pulses in all 4 extremities. Respiratory: Clear to auscultation with no wheezes or rales. Good inspiratory effort. Abdomen: Soft nontender nondistended with normal bowel sounds x4 quadrants. Muskuloskeletal: No cyanosis, clubbing or edema noted bilaterally Neuro: CN II-XII intact, strength, sensation, reflexes Skin: No ulcerative lesions noted or rashes Psychiatry: Judgement and insight appear normal. Mood is appropriate for condition and setting          Labs on Admission:  Basic Metabolic Panel: Recent Labs  Lab 12/07/23 1648  NA 134*  K 4.1  CL 101  CO2 25  GLUCOSE 288*  BUN 12  CREATININE 0.84  CALCIUM 8.8*   Liver Function Tests: Recent Labs  Lab  12/07/23 1648  AST 23  ALT 31  ALKPHOS 99  BILITOT 0.5  PROT  6.5  ALBUMIN 4.3   No results for input(s): "LIPASE", "AMYLASE" in the last 168 hours. No results for input(s): "AMMONIA" in the last 168 hours. CBC: Recent Labs  Lab 12/07/23 1650  WBC 8.9  NEUTROABS 3.8  HGB 12.4  HCT 35.7*  MCV 83.8  PLT 265   Cardiac Enzymes: No results for input(s): "CKTOTAL", "CKMB", "CKMBINDEX", "TROPONINI" in the last 168 hours.  BNP (last 3 results) No results for input(s): "BNP" in the last 8760 hours.  ProBNP (last 3 results) No results for input(s): "PROBNP" in the last 8760 hours.  CBG: Recent Labs  Lab 12/07/23 1645  GLUCAP 266*    Radiological Exams on Admission: DG Chest Port 1 View Result Date: 12/07/2023 CLINICAL DATA:  Chest pain and hypertension EXAM: PORTABLE CHEST 1 VIEW COMPARISON:  Chest radiograph dated 10/14/2021 FINDINGS: Normal lung volumes. No focal consolidations. No pleural effusion or pneumothorax. Enlarged cardiomediastinal silhouette is likely projectional. Cervical spinal fixation hardware appears intact. Intrathecal lead tip terminates over the level of T7. IMPRESSION: No acute disease. Electronically Signed   By: Agustin Cree M.D.   On: 12/07/2023 20:04   CT ANGIO HEAD NECK W WO CM W PERF (CODE STROKE) Result Date: 12/07/2023 CLINICAL DATA:  Code stroke, headache, left arm numbness. EXAM: CT ANGIOGRAPHY HEAD AND NECK TECHNIQUE: Multidetector CT imaging of the head and neck was performed using the standard protocol during bolus administration of intravenous contrast. Multiplanar CT image reconstructions and MIPs were obtained to evaluate the vascular anatomy. Carotid stenosis measurements (when applicable) are obtained utilizing NASCET criteria, using the distal internal carotid diameter as the denominator. RADIATION DOSE REDUCTION: This exam was performed according to the departmental dose-optimization program which includes automated exposure control, adjustment of the mA and/or kV according to patient size and/or use of iterative  reconstruction technique. CONTRAST:  OMNIPAQUE IOHEXOL 350 MG/ML SOLN COMPARISON:  Same-day head CT.  CTA head and neck 01/08/2015. FINDINGS: CTA NECK FINDINGS Aortic arch: Standard configuration of the aortic arch. Imaged portion shows no evidence of aneurysm or dissection. No significant stenosis of the major arch vessel origins. Pulmonary arteries: As permitted by contrast timing, there are no filling defects in the visualized pulmonary arteries. Subclavian arteries: The subclavian arteries are patent bilaterally. Right carotid system: No evidence of dissection, stenosis (50% or greater), or occlusion. Left carotid system: No evidence of dissection, stenosis (50% or greater), or occlusion. Vertebral arteries: Codominant. No evidence of dissection, stenosis (50% or greater), or occlusion. Skeleton: No acute or aggressive finding. Anterior cervical fusion hardware from C4-C7. Sequelae of left frontal craniotomy. Other neck: The visualized airway is patent. No cervical lymphadenopathy. Upper chest: Visualized lung apices are clear. Review of the MIP images confirms the above findings CTA HEAD FINDINGS ANTERIOR CIRCULATION: The intracranial ICAs are patent bilaterally. No significant stenosis, proximal occlusion, aneurysm, or vascular malformation. MCAs: The middle cerebral arteries are patent bilaterally. ACAs: The anterior cerebral arteries are patent bilaterally. POSTERIOR CIRCULATION: No significant stenosis, proximal occlusion, aneurysm, or vascular malformation. PCAs: The posterior cerebral arteries are patent bilaterally. Pcomm: Not well visualized. SCAs: The superior cerebellar arteries are patent bilaterally. Basilar artery: Patent AICAs: Not well visualized. PICAs: Patent Vertebral arteries: The intracranial vertebral arteries are patent. Venous sinuses: As permitted by contrast timing, patent. Anatomic variants: None Review of the MIP images confirms the above findings IMPRESSION: No large vessel  occlusion. No high-grade stenosis of  the arteries in the head and neck. Electronically Signed   By: Emily Filbert M.D.   On: 12/07/2023 18:02   CT HEAD CODE STROKE WO CONTRAST Result Date: 12/07/2023 CLINICAL DATA:  Code stroke. Neuro deficit, headache with left arm numbness. EXAM: CT HEAD WITHOUT CONTRAST TECHNIQUE: Contiguous axial images were obtained from the base of the skull through the vertex without intravenous contrast. RADIATION DOSE REDUCTION: This exam was performed according to the departmental dose-optimization program which includes automated exposure control, adjustment of the mA and/or kV according to patient size and/or use of iterative reconstruction technique. COMPARISON:  CT head 02/03/2020. FINDINGS: Brain: No acute intracranial hemorrhage. No CT evidence of acute infarct. No edema, mass effect, or midline shift. The basilar cisterns are patent. Ventricles: The ventricles are normal. Vascular: No hyperdense vessel or unexpected calcification. Skull: Postsurgical changes of the left frontal calvarium. No acute or aggressive finding. Orbits: Orbits are symmetric. Sinuses: The visualized paranasal sinuses are clear. Other: Mastoid air cells are clear. ASPECTS [facility] Stroke Program Early CT Score) - Ganglionic level infarction (caudate, lentiform nuclei, internal capsule, insula, M1-M3 cortex): 7 - Supraganglionic infarction (M4-M6 cortex): 3 Total score (0-10 with 10 being normal): 10 IMPRESSION: 1. No CT evidence of acute intracranial abnormality. 2. ASPECTS is 10. These results were called by telephone at the time of interpretation on 12/07/2023 at 5:07 pm to provider [facility] , who verbally acknowledged these results. Electronically Signed   By: Emily Filbert M.D.   On: 12/07/2023 17:08    EKG: I independently viewed the EKG done and my findings are as followed: Sinus rhythm rate of 82.  Nonspecific ST-T changes.  TC 484.  Assessment/Plan Present on  Admission: **None**  Principal Problem:   Left-sided weakness  Left-sided weakness with concern for possible TIA versus CVA CT angio head and neck no evidence of LVO. MRI brain ordered and is pending Follow 2D echo, fasting lipid panel, A1c PT/OT/speech therapist assessment Permissive hypertension until stroke is ruled out Neurology consulted and is following Rest of management per neurology The patient has a spinal stimulator in place, if not compatible with MRI consider repeating CT head, 24-hour after initial noncontrast head CT.  Hyperlipidemia Resume home Crestor  Type 2 diabetes with hyperglycemia Follow hemoglobin A1c Goal A1c less than 7.0 Start basal insulin and short acting insulin  Diabetic polyneuropathy Resume home gabapentin  Hypothyroidism Resume home levothyroxine  Obesity BMI 32 Recommend weight loss outpatient regular physical activity and healthy dieting.  Chronic anxiety/depression Resume home regimen.  GERD Resume home regimen.   Time: 75 minutes.   DVT prophylaxis: Subcu Lovenox daily.  Code Status: Full code.  Family Communication: Husband at bedside.  Disposition Plan: Admitted to telemetry medical unit.  Accepted by Dr. Allena Katz.  Consults called: Neurology.  Admission status: Observation status.   Status is: Observation    Darlin Drop MD [facility] Pager [phone]  If 7PM-7AM, please contact night-coverage [email or web address] Password [email or web address]  12/08/2023, 12:50 AM

## 2023-12-08 NOTE — Plan of Care (Signed)
  Problem: Education: Goal: Knowledge of General Education information will improve Description: Including pain rating scale, medication(s)/side effects and non-pharmacologic comfort measures 12/08/2023 0734 by Dahlia Bailiff, RN Outcome: Progressing 12/07/2023 2204 by Dahlia Bailiff, RN Outcome: Progressing   Problem: Health Behavior/Discharge Planning: Goal: Ability to manage health-related needs will improve 12/08/2023 0734 by Dahlia Bailiff, RN Outcome: Progressing 12/07/2023 2204 by Dahlia Bailiff, RN Outcome: Progressing   Problem: Clinical Measurements: Goal: Ability to maintain clinical measurements within normal limits will improve 12/08/2023 0734 by Dahlia Bailiff, RN Outcome: Progressing 12/07/2023 2204 by Dahlia Bailiff, RN Outcome: Progressing

## 2023-12-08 NOTE — Hospital Course (Addendum)
 Same day note   [name] is a 58 y.o. female with medical history significant for obesity, type 2 diabetes, hypertension, hypothyroidism, chronic back pain status post spinal stimulator placement, presented initially to [facility] ER with complaints of sudden onset headache and left-sided weakness. Symptoms improved after arrival to the ER but were still present.  In the ED patient had slightly elevated blood pressure with mild tachycardia.  EKG showed normal sinus rhythm.  Chest x-ray was nonacute.  Labs showed mild hyponatremia with sodium of 134.  Patient was seen by teleneurologist and recommended further workup and admission.  CTA of the head and neck without any large vessel occlusion.  Patient was then admitted to [facility].  Patient seen and examined at bedside.  Patient was admitted to the hospital for  At the time of my evaluation, patient complains of  Physical examination reveals  Laboratory data and imaging was reviewed  Assessment and Plan.  Left-sided weakness with concern for possible TIA versus CVA CT angiogram of the head and neck with no evidence for large vessel occlusion.    Check 2D echocardiogram.  Lipid profile with LDL of 116.  Recent hemoglobin A1c from month back was 9.7.  Neurology has been consulted and following.  Ending MRI of the brain due to stimulator.  Plan for repeat CT scan after 24 hours if unable to do MRI of the brain..  Continue PT OT speech therapy evaluation.   Hyperlipidemia Resume home Crestor.  LDL of 116.   Type 2 diabetes with hyperglycemia Continue basal insulin and short acting insulin, recent hemoglobin A1c from month back was 9.7.   Diabetic polyneuropathy Continue gabapentin   Hypothyroidism Continue Synthroid   Class I obesity There is no height or weight on file to calculate BMI.  Patient would benefit from ongoing weight loss as outpatient.   Chronic anxiety/depression Continue Paxil.   GERD Continue  pantoprazole.    No Charge  Signed,  Tenny Craw, MD [facility]

## 2023-12-08 NOTE — TOC Initial Note (Signed)
 Transition of Care [facility]) - Initial/Assessment Note    Patient Details  Name: [name] MRN: [id number] Date of Birth: 06/09/1966  Transition of Care [facility]) CM/SW Contact:    Kermit Balo, RN Phone Number: 12/08/2023, 1:10 PM  Clinical Narrative:                  Pt is from home with her son and fiance. Fiance is home all the time currently as he is s/p shoulder surgery.  She denies issues with transportation or home medications. Home health recommended. Pt provided choice and she selected [facility]. [facility] accepted. Information on the AVS.  TOC following for further d/c needs.   Expected Discharge Plan: Home w Home Health Services Barriers to Discharge: Continued Medical Work up   Patient Goals and CMS Choice   CMS Medicare.gov Compare Post Acute Care list provided to:: Patient Choice offered to / list presented to : Patient      Expected Discharge Plan and Services   Discharge Planning Services: CM Consult Post Acute Care Choice: Home Health Living arrangements for the past 2 months: Single Family Home                           HH Arranged: PT, OT HH Agency: [facility] Date [facility] Agency Contacted: 12/08/23   Representative spoke with at [facility] Agency: Clifton Custard  Prior Living Arrangements/Services Living arrangements for the past 2 months: Single Family Home Lives with:: Adult Children, Significant Other Patient language and need for interpreter reviewed:: Yes Do you feel safe going back to the place where you live?: Yes        Care giver support system in place?: Yes (comment) Current home services: DME (Walker/ wheelchair/ shower seat) Criminal Activity/Legal Involvement Pertinent to Current Situation/Hospitalization: No - Comment as needed  Activities of Daily Living      Permission Sought/Granted                  Emotional Assessment Appearance:: Appears stated age Attitude/Demeanor/Rapport: Engaged Affect (typically  observed): Accepting Orientation: : Oriented to Self, Oriented to Place, Oriented to  Time, Oriented to Situation   Psych Involvement: No (comment)  Admission diagnosis:  Left-sided weakness [R53.1] Patient Active Problem List   Diagnosis Date Noted   Left leg weakness 02/02/2020   Complicated migraine 03/12/2017   Class 1 obesity due to excess calories with body mass index (BMI) of 32.0 to 32.9 in adult    Neurological symptoms 03/11/2017   Steroid-induced hyperglycemia 03/11/2017   Depression 03/11/2017   Essential hypertension 03/11/2017   Hypothyroidism 03/11/2017   Chronic pain 03/11/2017   Palpitations 11/04/2016   Near syncope 11/04/2016   Dyspnea 11/04/2016   HA (headache) 11/14/2014   Sarcoid 11/12/2014   Left-sided weakness    Left hemiparesis (HCC) 09/18/2014   Weakness 09/18/2014   Frontal skull lesion 09/18/2014   Atypical chest pain 09/18/2014   Extrinsic asthma 04/25/2013   Weight gain 01/12/2013   Erosion of graft 11/16/2012   PCP:  Camie Patience, FNP Pharmacy:   [phone] [place], [place] - [address] [place] [address] Phone: [phone] Fax: [phone]  [id number] [place], [place] - [address] AT CORNER OF [facility] [address] [place] [address] Phone: [phone] Fax: [phone]  [facility] [place], [place] - [address]  [facility] AT Portal to Registered [facility] Sites [address] [place] [address] Phone: [phone] Fax: [phone]  [facility] [place] [place] - [address] [facility] [address] [place] [address] Phone: [phone] Fax: [phone]  [id number] [place], [place] - [address] AT [facility] OF [address] [place] [address] Phone: [phone] Fax: [phone]     Social  Drivers of Health (SDOH) Social History: SDOH Screenings   Food Insecurity: No Food Insecurity (12/07/2023)  Housing: Unknown (12/07/2023)  Transportation Needs: No Transportation Needs (12/07/2023)  Utilities: Not At Risk (12/07/2023)  Financial Resource Strain: Low Risk  (10/25/2023)   Received from [facility]  Physical Activity: Unknown (10/25/2023)   Received from [facility]  Social Connections: Moderately Integrated (10/25/2023)   [facility] from [facility]  Stress: No Stress Concern Present (10/25/2023)   Received from [facility]  Tobacco Use: Medium Risk (12/07/2023)   SDOH Interventions:     Readmission Risk Interventions     No data to display

## 2023-12-09 DIAGNOSIS — R519 Headache, unspecified: Secondary | ICD-10-CM | POA: Diagnosis not present

## 2023-12-09 DIAGNOSIS — R531 Weakness: Secondary | ICD-10-CM | POA: Diagnosis not present

## 2023-12-09 LAB — CBC
HCT: 36.7 % (ref 36.0–46.0)
Hemoglobin: 12.6 g/dL (ref 12.0–15.0)
MCH: 29 pg (ref 26.0–34.0)
MCHC: 34.3 g/dL (ref 30.0–36.0)
MCV: 84.4 fL (ref 80.0–100.0)
Platelets: 262 10*3/uL (ref 150–400)
RBC: 4.35 MIL/uL (ref 3.87–5.11)
RDW: 13.7 % (ref 11.5–15.5)
WBC: 8.9 10*3/uL (ref 4.0–10.5)
nRBC: 0 % (ref 0.0–0.2)

## 2023-12-09 LAB — BASIC METABOLIC PANEL WITH GFR
Anion gap: 10 (ref 5–15)
BUN: 15 mg/dL (ref 6–20)
CO2: 25 mmol/L (ref 22–32)
Calcium: 9 mg/dL (ref 8.9–10.3)
Chloride: 103 mmol/L (ref 98–111)
Creatinine, Ser: 0.76 mg/dL (ref 0.44–1.00)
GFR, Estimated: >60 mL/min (ref >60)
Glucose, Bld: 161 mg/dL — ABNORMAL HIGH (ref 70–99)
Potassium: 4 mmol/L (ref 3.5–5.1)
Sodium: 138 mmol/L (ref 135–145)

## 2023-12-09 LAB — GLUCOSE, CAPILLARY
Glucose-Capillary: 154 mg/dL — ABNORMAL HIGH (ref 70–99)
Glucose-Capillary: 135 mg/dL — ABNORMAL HIGH (ref 70–99)

## 2023-12-09 LAB — HIV ANTIBODY (ROUTINE TESTING W REFLEX): HIV Screen 4th Generation wRfx: NONREACTIVE

## 2023-12-09 LAB — MAGNESIUM: Magnesium: 1.9 mg/dL (ref 1.7–2.4)

## 2023-12-09 MED ORDER — BUTALBITAL-APAP-CAFFEINE 50-325-40 MG PO TABS: 1.0000 | ORAL_TABLET | Freq: Four times a day (QID) | ORAL | 0 refills | Status: DC | PRN

## 2023-12-09 MED ORDER — ASPIRIN 81 MG PO TBEC: 81.0000 mg | DELAYED_RELEASE_TABLET | Freq: Every day | ORAL | 12 refills | Status: DC

## 2023-12-09 MED ORDER — BUTALBITAL-APAP-CAFFEINE 50-325-40 MG PO TABS
1.0000 | ORAL_TABLET | Freq: Four times a day (QID) | ORAL | Status: DC | PRN
  Administered 2023-12-09: 1 via ORAL
  Filled 2023-12-09: qty 1

## 2023-12-09 MED ORDER — KETOROLAC TROMETHAMINE 30 MG/ML IJ SOLN
30.0000 mg | Freq: Once | INTRAMUSCULAR | Status: AC
  Administered 2023-12-09: 30 mg via INTRAVENOUS
  Filled 2023-12-09: qty 1

## 2023-12-09 NOTE — Progress Notes (Signed)
 STROKE TEAM PROGRESS NOTE :    Subjective : Patient states she is having a bad headache today.  She does follow-up with neurologist Dr Maple [place] in [place] and states she gets Dilaudid for her headaches..  She still has some left-sided weakness and numbness which is unchanged.  Vital signs are stable.  Repeat CT scan of the head shows no definite acute infarct.  There is a questionable hypodensity in the right temporal cortex seen only on the axial images and not seen on corresponding coronal or sagittal images and is likely an artifact  Neurological exam :  She is awake alert oriented to time place and person.  Speech and language are normal.  Extraocular movements are full range without nystagmus.  Face is symmetric without weakness.  Tongue is midline motor system exam shows no left upper or lower extremity drift but poor effort and unable to elevate left upper and lower extremity to the same degree as the right.  Subjective diminished touch pinprick and vibration sensation in the left hemibody including before and splits the midline.  Finger-to-nose coordination is accurate.  Gait not tested.   CT head no acute abnormality CT angiogram of brain and neck no large vessel stenosis. Repeat CT head with contrast no definite acute abnormality.  Questionable hypodensity in the right temporal lobe likely volume averaging LDL cholesterol 160 mg percent.  Triglycerides 162 mg percent. Hemoglobin A1c 10.0. 2D echo ejection fraction 60 to 65%.  Left atrial size normal.   MRI cannot be done as patient has a spinal cord stimulator.  Will repeat CT head with and without contrast  IMPRESSION : 57 year old lady with strokelike episode with sudden onset of headache with left-sided weakness and numbness with nonorganic features on exam with giveaway weakness and splitting the midline for touch and vibratory sensation. MRI cannot be done due to spinal cord stimulator.  Will repeat CT head with and without  contrast.  Plan    continue mobilization out of bed.  Treatment of headache with IV Toradol and outpatient follow-up with her neurologist Dr. Arletha Grippe in [facility] for headache management..  Aspirin for stroke prevention. Aggressive risk factor modification. Discussed with patient and answered questions.  Discussed with Dr. Tyson Babinski Greater than 50% time during this 35-minute visit was spent in counseling and coordination of care and discussion patient care team managing patient.  Delia Heady, MD

## 2023-12-09 NOTE — Plan of Care (Signed)
  Problem: Clinical Measurements: Goal: Ability to maintain clinical measurements within normal limits will improve Outcome: Progressing Goal: Will remain free from infection Outcome: Progressing Goal: Diagnostic test results will improve Outcome: Progressing Goal: Respiratory complications will improve Outcome: Progressing Goal: Cardiovascular complication will be avoided Outcome: Progressing   Problem: Nutrition: Goal: Adequate nutrition will be maintained Outcome: Progressing   Problem: Activity: Goal: Risk for activity intolerance will decrease Outcome: Progressing   Problem: Elimination: Goal: Will not experience complications related to bowel motility Outcome: Progressing Goal: Will not experience complications related to urinary retention Outcome: Progressing   Problem: Pain Managment: Goal: General experience of comfort will improve and/or be controlled Outcome: Progressing   Problem: Safety: Goal: Ability to remain free from injury will improve Outcome: Progressing   Problem: Skin Integrity: Goal: Risk for impaired skin integrity will decrease Outcome: Progressing

## 2023-12-09 NOTE — Progress Notes (Signed)
 Occupational Therapy Treatment Patient Details Name: [name] MRN: [id number] DOB: 08/31/1966 Today's Date: 12/09/2023   History of present illness 58 yo female who presents to [facility] from [facility] on 12/07/23 with c/o headaches and L sided weakness. CT negative for acute abnormalities, possible R tempero-occipital infarct, unable to perform MRI. PMH including chronic back pain and cervical post laminectomy syndrome with spinal stimulator, hx of lumbar fusion, HTN, migraine, asthma, hypothyroidism.   OT comments  Pt continues to c/o blurry vision on L side, new onset of diplopia. Trial taping of lens of L nasal portion which pt reports to have resolved diplopia, educated pt on lens taping and provided handout. Also educated pt on scanning strategies as her L side is blurry compared to R, L grip remains weak so provided pt with putty and reviewed exercises. Session limited by onset of nausea and dizziness, unable to test dynamic balance with new lens taping. OT to continue following pt acutely. Recommend pt follow-up with Outpatient Neuro OT.       If plan is discharge home, recommend the following:  Assistance with cooking/housework;Assist for transportation   Equipment Recommendations  None recommended by OT    Recommendations for Other Services      Precautions / Restrictions Precautions Precautions: Fall Recall of Precautions/Restrictions: Intact Restrictions Weight Bearing Restrictions Per Provider Order: No       Mobility Bed Mobility Overal bed mobility: Modified Independent                  Transfers                   General transfer comment: deferred, onset of nausea and dizziness. RN alerted.     Balance Overall balance assessment: Needs assistance Sitting-balance support: Feet supported, No upper extremity supported Sitting balance-Leahy Scale: Good                                     ADL either performed or assessed with  clinical judgement   ADL                                         General ADL Comments: Educated pt on low vision stratgies including scanning. Taped glasses over L lens medially for improved diplopia.    Extremity/Trunk Assessment              Vision   Additional Comments: Pt now with reports of L sided diplopia (side by side)   Perception     Praxis     Communication Communication Communication: No apparent difficulties   Cognition Arousal: Alert Behavior During Therapy: WFL for tasks assessed/performed Cognition: No apparent impairments                               Following commands: Intact        Cueing   Cueing Techniques: Verbal cues  Exercises Other Exercises Other Exercises: Pink theraputty exercises LUE    Shoulder Instructions       General Comments      Pertinent Vitals/ Pain       Pain Assessment Pain Assessment: Faces Faces Pain Scale: Hurts even more Pain Location: head Pain Descriptors / Indicators: Headache Pain Intervention(s): Monitored during session, Limited activity  within patient's tolerance, Patient requesting pain meds-RN notified  Home Living                                          Prior Functioning/Environment              Frequency  Min 2X/week        Progress Toward Goals  OT Goals(current goals can now be found in the care plan section)  Progress towards OT goals: Progressing toward goals  Acute Rehab OT Goals Patient Stated Goal: To go home OT Goal Formulation: With patient Time For Goal Achievement: 12/22/23 Potential to Achieve Goals: Good  Plan      Co-evaluation                 AM-PAC OT "6 Clicks" Daily Activity     Outcome Measure   Help from another person eating meals?: None Help from another person taking care of personal grooming?: A Little Help from another person toileting, which includes using toliet, bedpan, or urinal?: A  Little Help from another person bathing (including washing, rinsing, drying)?: A Little Help from another person to put on and taking off regular upper body clothing?: A Little Help from another person to put on and taking off regular lower body clothing?: A Little 6 Click Score: 19    End of Session    OT Visit Diagnosis: Unsteadiness on feet (R26.81);Low vision, both eyes (H54.2)   Activity Tolerance Other (comment) (Limited by onset of nause and dizziness)   Patient Left in bed;with call bell/phone within reach   Nurse Communication Mobility status        Time: 1610-9604 OT Time Calculation (min): 25 min  Charges: OT General Charges $OT Visit: 1 Visit OT Treatments $Therapeutic Activity: 23-37 mins  12/09/2023  AB, OTR/L  Acute Rehabilitation Services  Office: [phone]   Tristan Schroeder 12/09/2023, 10:25 AM

## 2023-12-09 NOTE — Progress Notes (Signed)
 SLP Cancellation Note  Patient Details Name: [name] MRN: [id number] DOB: 13-Oct-1965   Cancelled treatment:         Upon entrance, patient asleep however awoken via tactile and verbal stimulation. SLP introduced self and explained role. SLP attempted to complete cognitive-linguistic evaluation, though patient requested SLP return at a later time. SLP to attempt to complete evaluation as patient/therapist schedule allows.    Marwah Disbro M.A., CCC-SLP 12/09/2023, 11:30 AM

## 2023-12-09 NOTE — Discharge Summary (Signed)
 Physician Discharge Summary  [name] AOZ:[id number] DOB: 11-Aug-1966 DOA: 12/07/2023  PCP: Camie Patience, FNP  Admit date: 12/07/2023 Discharge date: 12/09/2023  Admitted From: Home  Discharge disposition: home with home health   Recommendations for Outpatient Follow-Up:   Follow up with your primary care provider in one week.  Check CBC, BMP, magnesium in the next visit Follow-up with your neurologist at [facility] for headache   Discharge Diagnosis:   Principal Problem:   Left-sided weakness  Discharge Condition: Improved.  Diet recommendation: Low sodium, heart healthy.  Carbohydrate-modified.  Wound care: None.  Code status: Full.   History of Present Illness:   [name] is a 58 y.o. female with medical history significant for obesity, type 2 diabetes, hypertension, hypothyroidism, chronic back pain status post spinal stimulator placement, presented initially to [facility] ER with complaints of sudden onset headache and left-sided weakness. Symptoms improved after arrival to the ER but were still present.  In the ED patient had slightly elevated blood pressure with mild tachycardia.  EKG showed normal sinus rhythm.  Chest x-ray was nonacute.  Labs showed mild hyponatremia with sodium of 134.  Patient was seen by teleneurologist and recommended further workup and admission.  CTA of the head and neck without any large vessel occlusion.  Patient was then admitted to [facility] for further evaluation and treatment.   Hospital Course:   Following conditions were addressed during hospitalization as listed below,  Left-sided weakness with concern for possible TIA versus CVA CT angiogram of the head and neck with no evidence for large vessel occlusion.    2D echocardiogram showed LV ejection fraction of 60 to 65%..  Lipid profile with LDL of 116.  Recent hemoglobin A1c from month back was 9.7.  Neurology has seen the patient at this time and  recommended aspirin.  Unlikely to be able to do MRI of the brain due to stimulator.  repeat CT scan after 24 hours was reviewed by neurology who think that there is no definite acute abnormality and recommendation was aspirin.  Patient was also seen by physical therapy and recommend home health PT OT on discharge.   Hyperlipidemia Resume home Crestor.  LDL of 116.   Type 2 diabetes with hyperglycemia Continue basal insulin at home.  Recent hemoglobin A1c from month back was 9.7.   Diabetic polyneuropathy Continue gabapentin   Hypothyroidism Continue Synthroid   Class I obesity There is no height or weight on file to calculate BMI.  Patient would benefit from ongoing weight loss as outpatient.  Chronic anxiety/depression Continue Paxil.   GERD Continue pantoprazole.  Migraine headache. Advised followup with neurology at [place].  Disposition.  At this time, patient is stable for disposition home with outpatient PCP and neurology follow-up.  Medical Consultants:   Neurology  Procedures:    None Subjective:   Today, patient was seen and examined at bedside.  Complains of mild headache but otherwise okay.  Seen by neurology and okay for discharge.  Discharge Exam:   Vitals:   12/09/23 0806 12/09/23 1143  BP:  123/71  Pulse: 78 72  Resp: 18 18  Temp:  98.3 F (36.8 C)  SpO2: 94% 95%   Vitals:   12/09/23 0349 12/09/23 0801 12/09/23 0806 12/09/23 1143  BP: 123/86 (!) 134/93  123/71  Pulse: 85 79 78 72  Resp: 18  18 18   Temp: 97.8 F (36.6 C) 98.3 F (36.8 C)  98.3 F (36.8 C)  TempSrc: Oral Oral  Oral  SpO2: 95% 92% 94% 95%  There is no height or weight on file to calculate BMI.   General: Alert awake, not in obvious distress HENT: pupils equally reacting to light,  No scleral pallor or icterus noted. Oral mucosa is moist.  Chest:  Clear breath sounds.   No crackles or wheezes.  CVS: S1 &S2 heard. No murmur.  Regular rate and rhythm. Abdomen: Soft,  nontender, nondistended.  Bowel sounds are heard.   Extremities: No cyanosis, clubbing or edema.  Peripheral pulses are palpable. Psych: Alert, awake and oriented, flat affect CNS:  No cranial nerve deficits.  Moves all extremities..   Skin: Warm and dry.  No rashes noted.  The results of significant diagnostics from this hospitalization (including imaging, microbiology, ancillary and laboratory) are listed below for reference.     Diagnostic Studies:   CT HEAD W & WO CONTRAST ( ) Result Date: 12/08/2023 CLINICAL DATA:  Provided history: Stroke, follow-up. EXAM: CT HEAD WITHOUT AND WITH CONTRAST TECHNIQUE: Contiguous axial images were obtained from the base of the skull through the vertex without and with intravenous contrast. RADIATION DOSE REDUCTION: This exam was performed according to the departmental dose-optimization program which includes automated exposure control, adjustment of the mA and/or kV according to patient size and/or use of iterative reconstruction technique. CONTRAST:  75mL OMNIPAQUE IOHEXOL 350 MG/ML SOLN COMPARISON:  Non-contrast head CT and CT angiogram head/neck 12/07/2023. Prior brain MRI examinations 01/08/2015 and earlier. FINDINGS: Brain: No age-advanced or lobar predominant cerebral atrophy. A small acute cortically-based infarct is questioned within the right temporo-occipital lobes (versus volume averaging) (series 3, image 15). Mild patchy and ill-defined hypoattenuation within the cerebral white matter, nonspecific. There is no acute intracranial hemorrhage. No extra-axial fluid collection. No evidence of an intracranial mass. No midline shift. No pathologic intracranial enhancement identified. Vascular: No hyperdense vessel on pre-contrast imaging. Atherosclerotic calcifications. Enhancement of the proximal large arterial vessels and dural venous sinuses. Skull: Redemonstrated post-operative changes from remote left frontal calvarial bone biopsy/craniotomy (with  cranioplasty). Sinuses/Orbits: No orbital mass or acute orbital finding. No significant paranasal sinus disease at the imaged levels. IMPRESSION: 1. Possible small acute right temporo-occipital cortically-based infarct (versus volume averaging). 2. Background mild nonspecific cerebral white matter disease. 3. Redemonstrated post-operative changes from remote left frontal calvarial bone biopsy/craniotomy (with cranioplasty). Electronically Signed   By: Jackey Loge D.O.   On: 12/08/2023 17:45   ECHOCARDIOGRAM COMPLETE Result Date: 12/08/2023    ECHOCARDIOGRAM REPORT   Patient Name:   Jaonna Word Date of Exam: 12/08/2023 Medical Rec #:  [id number]            Height:       66.5 in Accession #:    [id number]           Weight:       201.6 lb Date of Birth:  1966/06/06            BSA:          2.018 m Patient Age:    58 years             BP:           114/73 mmHg Patient Gender: F                    HR:           77 bpm. Exam Location:  Inpatient Procedure: 2D Echo, Color Doppler, Cardiac Doppler and Intracardiac  Opacification Agent (Both Spectral and Color Flow Doppler were            utilized during procedure). Indications:    Stroke i63.9  History:        Patient has prior history of Echocardiogram examinations, most                 recent 10/16/2016. Risk Factors:Hypertension.  Sonographer:    Irving Burton Senior RDCS Referring Phys: Dow Adolph, N  Sonographer Comments: Technically difficult due to body habitus and lung interference. IMPRESSIONS  1. Left ventricular ejection fraction, by estimation, is 60 to 65%. The left ventricle has normal function. The left ventricle has no regional wall motion abnormalities. Left ventricular diastolic parameters were normal.  2. Right ventricular systolic function is normal. The right ventricular size is not well visualized. Tricuspid regurgitation signal is inadequate for assessing PA pressure.  3. The mitral valve is normal in structure. Trivial mitral valve  regurgitation. No evidence of mitral stenosis.  4. The aortic valve has an indeterminant number of cusps. Aortic valve regurgitation is not visualized. No aortic stenosis is present. Comparison(s): Prior images unable to be directly viewed, comparison made by report only. No significant change from prior study. Conclusion(s)/Recommendation(s): Normal biventricular function without evidence of hemodynamically significant valvular heart disease. No intracardiac source of embolism detected on this transthoracic study. Consider a transesophageal echocardiogram to exclude cardiac source of embolism if clinically indicated. FINDINGS  Left Ventricle: Left ventricular ejection fraction, by estimation, is 60 to 65%. The left ventricle has normal function. The left ventricle has no regional wall motion abnormalities. Definity contrast agent was given IV to delineate the left ventricular  endocardial borders. The left ventricular internal cavity size was normal in size. There is no left ventricular hypertrophy. Left ventricular diastolic parameters were normal. Right Ventricle: The right ventricular size is not well visualized. Right vetricular wall thickness was not well visualized. Right ventricular systolic function is normal. Tricuspid regurgitation signal is inadequate for assessing PA pressure. Left Atrium: Left atrial size was normal in size. Right Atrium: Right atrial size was normal in size. Pericardium: Trivial pericardial effusion is present. Presence of epicardial fat layer. Mitral Valve: The mitral valve is normal in structure. Trivial mitral valve regurgitation. No evidence of mitral valve stenosis. Tricuspid Valve: The tricuspid valve is normal in structure. Tricuspid valve regurgitation is trivial. No evidence of tricuspid stenosis. Aortic Valve: The aortic valve has an indeterminant number of cusps. Aortic valve regurgitation is not visualized. No aortic stenosis is present. Pulmonic Valve: The pulmonic valve  was not well visualized. Pulmonic valve regurgitation is trivial. No evidence of pulmonic stenosis. Aorta: The aortic root and ascending aorta are structurally normal, with no evidence of dilitation. Venous: The inferior vena cava was not well visualized. IAS/Shunts: The atrial septum is grossly normal.  LEFT VENTRICLE PLAX 2D LVIDd:         3.70 cm   Diastology LVIDs:         2.00 cm   LV e' medial:    6.42 cm/s LV PW:         1.00 cm   LV E/e' medial:  11.8 LV IVS:        1.00 cm   LV e' lateral:   8.59 cm/s LVOT diam:     2.30 cm   LV E/e' lateral: 8.8 LV SV:         79 LV SV Index:   39 LVOT Area:     4.15  cm  RIGHT VENTRICLE RV S prime:     16.40 cm/s TAPSE (M-mode): 2.0 cm LEFT ATRIUM           Index        RIGHT ATRIUM           Index LA diam:      3.00 cm 1.49 cm/m   RA Area:     14.30 cm LA Vol (A4C): 35.9 ml 17.79 ml/m  RA Volume:   31.50 ml  15.61 ml/m  AORTIC VALVE LVOT Vmax:   91.80 cm/s LVOT Vmean:  59.600 cm/s LVOT VTI:    0.191 m  AORTA Ao Root diam: 3.20 cm Ao Asc diam:  3.20 cm MITRAL VALVE MV Area (PHT): 3.17 cm    SHUNTS MV Decel Time: 239 msec    Systemic VTI:  0.19 m MV E velocity: 76.00 cm/s  Systemic Diam: 2.30 cm MV A velocity: 70.20 cm/s MV E/A ratio:  1.08 Jodelle Red MD Electronically signed by Jodelle Red MD Signature Date/Time: 12/08/2023/5:25:26 PM    Final    DG Chest Port 1 View Result Date: 12/07/2023 CLINICAL DATA:  Chest pain and hypertension EXAM: PORTABLE CHEST 1 VIEW COMPARISON:  Chest radiograph dated 10/14/2021 FINDINGS: Normal lung volumes. No focal consolidations. No pleural effusion or pneumothorax. Enlarged cardiomediastinal silhouette is likely projectional. Cervical spinal fixation hardware appears intact. Intrathecal lead tip terminates over the level of T7. IMPRESSION: No acute disease. Electronically Signed   By: Agustin Cree M.D.   On: 12/07/2023 20:04   CT ANGIO HEAD NECK W WO CM W PERF (CODE STROKE) Result Date: 12/07/2023 CLINICAL DATA:   Code stroke, headache, left arm numbness. EXAM: CT ANGIOGRAPHY HEAD AND NECK TECHNIQUE: Multidetector CT imaging of the head and neck was performed using the standard protocol during bolus administration of intravenous contrast. Multiplanar CT image reconstructions and MIPs were obtained to evaluate the vascular anatomy. Carotid stenosis measurements (when applicable) are obtained utilizing NASCET criteria, using the distal internal carotid diameter as the denominator. RADIATION DOSE REDUCTION: This exam was performed according to the departmental dose-optimization program which includes automated exposure control, adjustment of the mA and/or kV according to patient size and/or use of iterative reconstruction technique. CONTRAST:  OMNIPAQUE IOHEXOL 350 MG/ML SOLN COMPARISON:  Same-day head CT.  CTA head and neck 01/08/2015. FINDINGS: CTA NECK FINDINGS Aortic arch: Standard configuration of the aortic arch. Imaged portion shows no evidence of aneurysm or dissection. No significant stenosis of the major arch vessel origins. Pulmonary arteries: As permitted by contrast timing, there are no filling defects in the visualized pulmonary arteries. Subclavian arteries: The subclavian arteries are patent bilaterally. Right carotid system: No evidence of dissection, stenosis (50% or greater), or occlusion. Left carotid system: No evidence of dissection, stenosis (50% or greater), or occlusion. Vertebral arteries: Codominant. No evidence of dissection, stenosis (50% or greater), or occlusion. Skeleton: No acute or aggressive finding. Anterior cervical fusion hardware from C4-C7. Sequelae of left frontal craniotomy. Other neck: The visualized airway is patent. No cervical lymphadenopathy. Upper chest: Visualized lung apices are clear. Review of the MIP images confirms the above findings CTA HEAD FINDINGS ANTERIOR CIRCULATION: The intracranial ICAs are patent bilaterally. No significant stenosis, proximal occlusion,  aneurysm, or vascular malformation. MCAs: The middle cerebral arteries are patent bilaterally. ACAs: The anterior cerebral arteries are patent bilaterally. POSTERIOR CIRCULATION: No significant stenosis, proximal occlusion, aneurysm, or vascular malformation. PCAs: The posterior cerebral arteries are patent bilaterally. Pcomm: Not well visualized. SCAs: The  superior cerebellar arteries are patent bilaterally. Basilar artery: Patent AICAs: Not well visualized. PICAs: Patent Vertebral arteries: The intracranial vertebral arteries are patent. Venous sinuses: As permitted by contrast timing, patent. Anatomic variants: None Review of the MIP images confirms the above findings IMPRESSION: No large vessel occlusion. No high-grade stenosis of the arteries in the head and neck. Electronically Signed   By: Emily Filbert M.D.   On: 12/07/2023 18:02   CT HEAD CODE STROKE WO CONTRAST Result Date: 12/07/2023 CLINICAL DATA:  Code stroke. Neuro deficit, headache with left arm numbness. EXAM: CT HEAD WITHOUT CONTRAST TECHNIQUE: Contiguous axial images were obtained from the base of the skull through the vertex without intravenous contrast. RADIATION DOSE REDUCTION: This exam was performed according to the departmental dose-optimization program which includes automated exposure control, adjustment of the mA and/or kV according to patient size and/or use of iterative reconstruction technique. COMPARISON:  CT head 02/03/2020. FINDINGS: Brain: No acute intracranial hemorrhage. No CT evidence of acute infarct. No edema, mass effect, or midline shift. The basilar cisterns are patent. Ventricles: The ventricles are normal. Vascular: No hyperdense vessel or unexpected calcification. Skull: Postsurgical changes of the left frontal calvarium. No acute or aggressive finding. Orbits: Orbits are symmetric. Sinuses: The visualized paranasal sinuses are clear. Other: Mastoid air cells are clear. ASPECTS [facility] Stroke Program Early CT Score) -  Ganglionic level infarction (caudate, lentiform nuclei, internal capsule, insula, M1-M3 cortex): 7 - Supraganglionic infarction (M4-M6 cortex): 3 Total score (0-10 with 10 being normal): 10 IMPRESSION: 1. No CT evidence of acute intracranial abnormality. 2. ASPECTS is 10. These results were called by telephone at the time of interpretation on 12/07/2023 at 5:07 pm to provider [facility] , who verbally acknowledged these results. Electronically Signed   By: Emily Filbert M.D.   On: 12/07/2023 17:08     Labs:   Basic Metabolic Panel: Recent Labs  Lab 12/07/23 1648 12/08/23 0544 12/09/23 0546  NA 134* 140 138  K 4.1 4.1 4.0  CL 101 104 103  CO2 25 26 25   GLUCOSE 288* 167* 161*  BUN 12 12 15   CREATININE 0.84 0.75 0.76  CALCIUM 8.8* 9.2 9.0  MG  --  2.0 1.9  PHOS  --  4.2  --    GFR Estimated Creatinine Clearance: 89.3 mL/min (by C-G formula based on SCr of 0.76 mg/dL). Liver Function Tests: Recent Labs  Lab 12/07/23 1648  AST 23  ALT 31  ALKPHOS 99  BILITOT 0.5  PROT 6.5  ALBUMIN 4.3   No results for input(s): "LIPASE", "AMYLASE" in the last 168 hours. No results for input(s): "AMMONIA" in the last 168 hours. Coagulation profile Recent Labs  Lab 12/07/23 1648  INR 1.0    CBC: Recent Labs  Lab 12/07/23 1650 12/08/23 0544 12/09/23 0546  WBC 8.9 10.6* 8.9  NEUTROABS 3.8  --   --   HGB 12.4 13.0 12.6  HCT 35.7* 37.5 36.7  MCV 83.8 84.1 84.4  PLT 265 288 262   Cardiac Enzymes: No results for input(s): "CKTOTAL", "CKMB", "CKMBINDEX", "TROPONINI" in the last 168 hours. BNP: Invalid input(s): "POCBNP" CBG: Recent Labs  Lab 12/08/23 1146 12/08/23 1719 12/08/23 2135 12/09/23 0616 12/09/23 1142  GLUCAP 214* 322* 172* 154* 135*   D-Dimer No results for input(s): "DDIMER" in the last 72 hours. Hgb A1c Recent Labs    12/08/23 0544  HGBA1C 10.0*   Lipid Profile Recent Labs    12/08/23 0544  CHOL 200  HDL 52  LDLCALC 116*  TRIG 162*  CHOLHDL 3.8    Thyroid function studies No results for input(s): "TSH", "T4TOTAL", "T3FREE", "THYROIDAB" in the last 72 hours.  Invalid input(s): "FREET3" Anemia work up No results for input(s): "VITAMINB12", "FOLATE", "FERRITIN", "TIBC", "IRON", "RETICCTPCT" in the last 72 hours. Microbiology No results found for this or any previous visit (from the past 240 hours).   Discharge Instructions:   Discharge Instructions     Call MD for:  persistant nausea and vomiting   Complete by: As directed    Call MD for:  severe uncontrolled pain   Complete by: As directed    Diet - low sodium heart healthy   Complete by: As directed    Discharge instructions   Complete by: As directed    Follow-up with your primary care provider in 1 week.  Check blood work at that time.  Follow-up with your neurologist at [facility] Dr Arletha Grippe for your headache issues.   Increase activity slowly   Complete by: As directed       Allergies as of 12/09/2023       Reactions   Lyrica [pregabalin] Anaphylaxis   Topiramate Other (See Comments)   Pt had hallucinations   Humira [adalimumab] Other (See Comments)   Muscle weakness   Infliximab Other (See Comments), Itching, Rash, Hives   Other reaction(s): infusion reaction, Other (See Comments)   Percocet [oxycodone-acetaminophen] Itching   Sulfamethoxazole-trimethoprim Other (See Comments)   Fever   Vicodin [hydrocodone-acetaminophen] Itching        Medication List     TAKE these medications    albuterol 108 (90 Base) MCG/ACT inhaler Commonly known as: VENTOLIN HFA Inhale 2 puffs into the lungs every 6 (six) hours as needed for wheezing or shortness of breath.   aspirin EC 81 MG tablet Take 1 tablet (81 mg total) by mouth daily. Swallow whole. Start taking on: December 10, 2023   butalbital-acetaminophen-caffeine 50-325-40 MG tablet Commonly known as: FIORICET Take 1 tablet by mouth every 6 (six) hours as needed for headache or migraine.   celecoxib 200 MG  capsule Commonly known as: CELEBREX Take 200 mg by mouth 2 (two) times daily as needed for mild pain.   fluticasone furoate-vilanterol 100-25 MCG/ACT Aepb Commonly known as: Breo Ellipta Inhale 1 puff into the lungs daily.   gabapentin 100 MG capsule Commonly known as: NEURONTIN Take 100 mg by mouth 4 (four) times daily as needed (for pain).   HYDROmorphone HCl 8 MG Tb24 Commonly known as: EXALGO Take 8 mg by mouth daily. Takes two tablets to equal 16mg    levothyroxine 137 MCG tablet Commonly known as: SYNTHROID Take 137 mcg by mouth daily before breakfast.   lipase/protease/amylase 12000-38000 units Cpep capsule Commonly known as: CREON Take 12,000 Units by mouth See admin instructions. Take two capsules by mouth  with each meal and 1 capsule by mouth with a snack per patient   naloxone 2 MG/2ML injection Commonly known as: NARCAN Inject 0.4 mg into the skin daily as needed (for overdose).   NovoLOG FlexPen 100 UNIT/ML FlexPen Generic drug: insulin aspart Take 5-10 units plus sliding scale with meals 3 times a day, maximum 40 units/day. What changed:  how much to take how to take this when to take this additional instructions   omeprazole 40 MG capsule Commonly known as: PRILOSEC Take 40 mg by mouth 2 (two) times daily.   ondansetron 4 MG tablet Commonly known as: Zofran Take 1 tablet (4 mg total) by  mouth every 8 (eight) hours as needed for nausea or vomiting. What changed: Another medication with the same name was removed. Continue taking this medication, and follow the directions you see here.   PARoxetine 40 MG tablet Commonly known as: PAXIL Take 40 mg by mouth at bedtime.   promethazine 25 MG tablet Commonly known as: PHENERGAN Take 1 tablet by mouth every 12 (twelve) hours as needed for nausea/vomiting.   rOPINIRole 2 MG tablet Commonly known as: REQUIP Take 4 mg by mouth at bedtime.   rosuvastatin 10 MG tablet Commonly known as: CRESTOR Take 10 mg  by mouth daily.   Semaglutide(0.25 or 0.5MG /DOS) 2 MG/3ML Sopn Inject 0.5 mg into the skin once a week.   Toujeo Max SoloStar 300 UNIT/ML Solostar Pen Generic drug: insulin glargine (2 Unit Dial) Inject 20 Units into the skin daily.   traMADol 50 MG tablet Commonly known as: ULTRAM Take 1 tablet (50 mg total) by mouth every 6 (six) hours as needed. What changed: reasons to take this   valACYclovir 500 MG tablet Commonly known as: VALTREX Take 500 mg by mouth daily.   vitamin B-12 100 MCG tablet Commonly known as: CYANOCOBALAMIN Take 100 mcg by mouth daily.   Vitamin D3 1.25 MG (50000 UT) Caps Take 50,000 Units by mouth once a week.        Follow-up Information     [facility] Follow up.   Specialty: Home Health Services Why: [facility] will contact you for the first home visit Contact information: [address] [place] [address] [phone]                  Time coordinating discharge: 39 minutes  Signed:  Ebert Forrester  [facility] 12/09/2023, 4:14 PM

## 2023-12-09 NOTE — TOC Transition Note (Signed)
 Transition of Care [facility]) - Discharge Note   Patient Details  Name: [name] MRN: [id number] Date of Birth: [birth year]  Transition of Care [facility]) CM/SW Contact:  Kermit Balo, RN Phone Number: 12/09/2023, 2:34 PM   Clinical Narrative:     Pt is discharging home with home health through [facility]. Information on the AVS. [facility] will contact her for the first home visit. Pt has transportation home.   Final next level of care: Home w Home Health Services Barriers to Discharge: No Barriers Identified   Patient Goals and CMS Choice   CMS Medicare.gov Compare Post Acute Care list provided to:: Patient Choice offered to / list presented to : Patient      Discharge Placement                       Discharge Plan and Services Additional resources added to the After Visit Summary for     Discharge Planning Services: CM Consult Post Acute Care Choice: Home Health                    HH Arranged: PT, OT [facility] Agency: [facility] Date [facility] Agency Contacted: 12/08/23   Representative spoke with at [facility] Agency: Clifton Custard  Social Drivers of Health (SDOH) Interventions SDOH Screenings   Food Insecurity: No Food Insecurity (12/07/2023)  Housing: Low Risk  (12/08/2023)  Transportation Needs: No Transportation Needs (12/07/2023)  Utilities: Not At Risk (12/07/2023)  Financial Resource Strain: Low Risk  (10/25/2023)   Received from [facility]  Physical Activity: Unknown (10/25/2023)   Received from [facility]  Social Connections: Moderately Integrated (10/25/2023)   Received from [facility]  Stress: No Stress Concern Present (10/25/2023)   Received from [facility]  Tobacco Use: Medium Risk (12/07/2023)     Readmission Risk Interventions     No data to display

## 2023-12-14 DIAGNOSIS — R519 Headache, unspecified: Secondary | ICD-10-CM | POA: Diagnosis not present

## 2023-12-14 DIAGNOSIS — I10 Essential (primary) hypertension: Secondary | ICD-10-CM | POA: Diagnosis not present

## 2023-12-14 DIAGNOSIS — E1165 Type 2 diabetes mellitus with hyperglycemia: Secondary | ICD-10-CM | POA: Diagnosis not present

## 2023-12-19 DIAGNOSIS — I10 Essential (primary) hypertension: Secondary | ICD-10-CM | POA: Diagnosis not present

## 2023-12-19 DIAGNOSIS — N1831 Chronic kidney disease, stage 3a: Secondary | ICD-10-CM | POA: Diagnosis not present

## 2023-12-19 DIAGNOSIS — E1165 Type 2 diabetes mellitus with hyperglycemia: Secondary | ICD-10-CM | POA: Diagnosis not present

## 2023-12-19 DIAGNOSIS — E1122 Type 2 diabetes mellitus with diabetic chronic kidney disease: Secondary | ICD-10-CM | POA: Diagnosis not present

## 2023-12-20 DIAGNOSIS — R799 Abnormal finding of blood chemistry, unspecified: Secondary | ICD-10-CM | POA: Diagnosis not present

## 2023-12-20 DIAGNOSIS — G2581 Restless legs syndrome: Secondary | ICD-10-CM | POA: Diagnosis not present

## 2023-12-20 DIAGNOSIS — G8929 Other chronic pain: Secondary | ICD-10-CM | POA: Diagnosis not present

## 2023-12-20 DIAGNOSIS — M25562 Pain in left knee: Secondary | ICD-10-CM | POA: Diagnosis not present

## 2023-12-21 DIAGNOSIS — G4710 Hypersomnia, unspecified: Secondary | ICD-10-CM | POA: Diagnosis not present

## 2023-12-21 DIAGNOSIS — I10 Essential (primary) hypertension: Secondary | ICD-10-CM | POA: Diagnosis not present

## 2023-12-21 DIAGNOSIS — E1165 Type 2 diabetes mellitus with hyperglycemia: Secondary | ICD-10-CM | POA: Diagnosis not present

## 2023-12-21 DIAGNOSIS — R600 Localized edema: Secondary | ICD-10-CM | POA: Diagnosis not present

## 2023-12-27 DIAGNOSIS — G4733 Obstructive sleep apnea (adult) (pediatric): Secondary | ICD-10-CM | POA: Diagnosis not present

## 2023-12-29 DIAGNOSIS — E1165 Type 2 diabetes mellitus with hyperglycemia: Secondary | ICD-10-CM | POA: Diagnosis not present

## 2023-12-29 DIAGNOSIS — I10 Essential (primary) hypertension: Secondary | ICD-10-CM | POA: Diagnosis not present

## 2023-12-29 DIAGNOSIS — N1831 Chronic kidney disease, stage 3a: Secondary | ICD-10-CM | POA: Diagnosis not present

## 2023-12-29 DIAGNOSIS — E039 Hypothyroidism, unspecified: Secondary | ICD-10-CM | POA: Diagnosis not present

## 2023-12-29 DIAGNOSIS — E1122 Type 2 diabetes mellitus with diabetic chronic kidney disease: Secondary | ICD-10-CM | POA: Diagnosis not present

## 2024-01-03 ENCOUNTER — Ambulatory Visit (INDEPENDENT_AMBULATORY_CARE_PROVIDER_SITE_OTHER): Admitting: Endocrinology

## 2024-01-03 ENCOUNTER — Encounter: Payer: Self-pay | Admitting: Endocrinology

## 2024-01-03 DIAGNOSIS — E1165 Type 2 diabetes mellitus with hyperglycemia: Secondary | ICD-10-CM | POA: Diagnosis not present

## 2024-01-03 DIAGNOSIS — Z794 Long term (current) use of insulin: Secondary | ICD-10-CM | POA: Diagnosis not present

## 2024-01-03 MED ORDER — TOUJEO MAX SOLOSTAR 300 UNIT/ML ~~LOC~~ SOPN: 30.0000 [IU] | PEN_INJECTOR | Freq: Every day | SUBCUTANEOUS | 3 refills | Status: DC

## 2024-01-03 MED ORDER — NOVOLOG FLEXPEN 100 UNIT/ML ~~LOC~~ SOPN: PEN_INJECTOR | SUBCUTANEOUS | 11 refills | Status: DC

## 2024-01-03 NOTE — Patient Instructions (Signed)
 Diabetes regimen  Increase Toujeo  to 30 units daily in the morning. Take Humalog  15 units with meals 3 times a day before eating, plus sliding scale as follows.  Moderate Sliding Scale Blood Glucose        Insulin  60-150                     None 151-200                   3 units 201-250                   5 units 251-300                   7 units 301-350                   9 units 351-400                  11 units    >400                       12 units and call provider  For snacks take 5 units of humalog .

## 2024-01-03 NOTE — Progress Notes (Signed)
 Outpatient Endocrinology Note [place] Yuriko Portales, MD   Patient's Name: Naphtali Berringer    DOB: 06/03/1966    MRN: [id number]                                                    REASON OF VISIT: Follow-up for type 2 diabetes mellitus  REFERRING PROVIDER: Bufford Carne, FNP  PCP: Bufford Carne, FNP  HISTORY OF PRESENT ILLNESS:   Darlin Dressel is a 58 y.o. old female with past medical history listed below, is here for follow-up for type 2 diabetes mellitus.   Pertinent Diabetes History: Patient was diagnosed with type 2 diabetes mellitus in 2020.  Patient has uncontrolled type 2 diabetes mellitus.  No personal history of pancreatitis and / or family history of medullary thyroid  carcinoma or MEN 2B syndrome.   Patient reports she may have had acute pancreatitis in March 2024 while evaluated in the ER, with a review of the records in chart CT abdomen at that time was unremarkable pancreas, lipase was less than 10, not elevated, was not mentioned above diagnosis of acute pancreatitis.  Patient reports she has been following with [facility] gastroenterology and reports has exocrine pancreatic insufficiency and takes Creon intermittently.  No records available to review.  Chronic Diabetes Complications : Retinopathy: no. Last ophthalmology exam was done on annually, reportedly, following with ophthalmology regularly.  Nephropathy: no Peripheral neuropathy: no Coronary artery disease: on Stroke: on  Relevant comorbidities and cardiovascular risk factors: Obesity: yes Body mass index is 31.73 kg/m.  Hypertension: Yes  Hyperlipidemia : Yes, on statin   Current / Home Diabetic regimen includes:  Toujeo  25 units daily in the morning.  Humalog  sliding scale 18 units 2 times a day. Ozempic  off for 1 month, was taking 1 mg weekly in the past.   Prior diabetic medications: Metformin  regular and extended release, had a GI intolerance and stopped.  She had taken Mounjaro in the past,  switched to Ozempic . Ozempic  makes sickness / nausea.  Glycemic data:    CONTINUOUS GLUCOSE MONITORING SYSTEM (CGMS) INTERPRETATION: At today's visit, we reviewed CGM downloads. The full report is scanned in the media. Reviewing the CGM trends, blood glucose are as follows:  FreeStyle Libre 2+ CGM-  Sensor Download (Sensor download was reviewed and summarized below.) Dates: April 22 to May 5 , 2025, 14 days Sensor Average: 262  Glucose Management Indicator: 9.6%  % data captured: 73%    Interpretation: Frequent postprandial hyperglycemia with blood sugar 330-400 range, with different meals usually with supper and bedtime eating and sometimes with lunch and breakfast.  Blood sugar in between the meals, overnight and early morning mostly in 160-200s range.  No hypoglycemia.  Hypoglycemia: Patient has no hypoglycemic episodes. Patient has hypoglycemia awareness.  Factors modifying glucose control: 1.  Diabetic diet assessment: 3 meals a day and sometimes bedtime snack.  2.  Staying active or exercising:   3.  Medication compliance: compliant most of the time.  Interval history  Patient had a ER visit in the last month with possible TIA.  Hemoglobin A1c was 10%.  CGM data as reviewed above with significant postprandial hyperglycemia.  Diabetes regimen as reviewed above and reports compliance with insulin  therapy.  Discussed about trying other antidiabetic medications, patient does not want to try new medication and wants to stay  on multidose insulin  regimen.  Patient reports having significant nausea and sickness and he stopped taking Ozempic .  No other complaints today.  REVIEW OF SYSTEMS As per history of present illness.   PAST MEDICAL HISTORY: Past Medical History:  Diagnosis Date   Ankylosing spondylitis (HCC)    Anxiety    Arthritis    "back, hands, neck" (09/18/2014)   Asthma    Atypical chest pain    Chronic back pain    "whole spine"   Chronic bronchitis (HCC)     "get it close to q yr" (09/18/2014)   Chronic pain    Complicated migraine    Depression    Environmental allergies    Erosion of suburethral sling (HCC)    Fibromyalgia    Frequency of urination    GERD (gastroesophageal reflux disease)    Headache    "at least several times/wk" (09/18/2014)   Heart murmur    "when I was a baby"   Herpes simplex type 2 infection    History of angina    HLA B27 positive    Hypertension    Hypothyroidism    IBS (irritable bowel syndrome)    IC (interstitial cystitis)    Lesion of frontal lobe of brain    Migraine    "maybe once/month" (09/18/2014)   Nocturia    NSVD (normal spontaneous vaginal delivery)    x3   Osteoarthritis    Pneumonia    RLS (restless legs syndrome)    Sarcoid    Seizures (HCC)    "when I was a teenager"   Shortness of breath dyspnea    Exertion   SUI (stress urinary incontinence, female)    Thyroid  disease     PAST SURGICAL HISTORY: Past Surgical History:  Procedure Laterality Date   ABDOMINAL HYSTERECTOMY  2006   ANTERIOR CERVICAL DECOMP/DISCECTOMY FUSION  2011   APPENDECTOMY  1985   ARTHROPLASTY     Left thumb   BACK SURGERY  2008   X 2 in 2010   BACK SURGERY     CESAREAN SECTION  2000   CRANIECTOMY FOR DEPRESSED SKULL FRACTURE Left 12/05/2014   Procedure: Left frontal stereotactic craniectomy for biopsy of skull lesion;  Surgeon: Augusto Blonder, MD;  Location: [facility];  Service: Neurosurgery;  Laterality: Left;  Left frontal stereotactic craniectomy for biopsy of skull lesion   CYSTO WITH HYDRODISTENSION N/A 11/30/2012   Procedure: CYSTOSCOPY/HYDRODISTENSION;  Surgeon: Livingston Rigg, MD;  Location: [facility];  Service: Urology;  Laterality: N/A;   KNEE ARTHROSCOPY Right    LUMBAR DISC SURGERY  02-02-2007   RIGHT SIDE L5 -- S1   LUMBAR FUSION  12-04-2008   L4  -- S1   LYNX RETROPUBIC SUBURETHRAL SLING  03-02-2007   MASS EXCISION Left 03/19/2017   Procedure: EXCISION LEFT WRIST  VOLAR MASS;  Surgeon: Florida Hurter, MD;  Location: [facility] OR;  Service: Orthopedics;  Laterality: Left;   MEDIASTINOSCOPY N/A 10/26/2014   Procedure: MEDIASTINOSCOPY;  Surgeon: Zelphia Higashi, MD;  Location: [facility] OR;  Service: Thoracic;  Laterality: N/A;   NECK SURGERY     PELVIC LAPAROSCOPY  1990's   LYSIS ADHESIONS   PUBOVAGINAL SLING N/A 11/30/2012   Procedure: Gino Lais;  Surgeon: Livingston Rigg, MD;  Location[facility];  Service: Urology;  Laterality: N/A;  1 HR EXAM UNDER ANESTHESIA, EXCISION OF SUB URETHRAL MESH, CYSTO, HOD    SPINAL CORD STIMULATOR IMPLANT     TONSILLECTOMY  1986   TUBAL LIGATION  2001   hulka clip   VIDEO BRONCHOSCOPY WITH ENDOBRONCHIAL ULTRASOUND N/A 09/21/2014   Procedure: VIDEO BRONCHOSCOPY WITH ENDOBRONCHIAL ULTRASOUND;  Surgeon: Denson Flake, MD;  Location: [facility] OR;  Service: Thoracic;  Laterality: N/A;    ALLERGIES: Allergies  Allergen Reactions   Lyrica [Pregabalin] Anaphylaxis   Topiramate  Other (See Comments)    Pt had hallucinations   Humira [Adalimumab] Other (See Comments)    Muscle weakness   Infliximab  Other (See Comments), Itching, Rash and Hives    Other reaction(s): infusion reaction, Other (See Comments)   Percocet [Oxycodone -Acetaminophen ] Itching   Sulfamethoxazole-Trimethoprim Other (See Comments)    Fever     Vicodin [Hydrocodone -Acetaminophen ] Itching    FAMILY HISTORY:  Family History  Problem Relation Age of Onset   Diabetes Mother    Hypertension Mother    Heart disease Mother    Cancer Father        LUNG AND BRAIN   Hypertension Father    Heart disease Maternal Grandmother    Cancer Maternal Grandfather        LUNG   Heart disease Paternal Grandmother    Cancer Paternal Grandfather     SOCIAL HISTORY: Social History   Socioeconomic History   Marital status: Married    Spouse name: Not on file   Number of children: Not on file   Years of education: Not on file   Highest  education level: Not on file  Occupational History   Occupation: unemployed due to medical reasons  Tobacco Use   Smoking status: Former    Current packs/day: 0.00    Average packs/day: 0.5 packs/day for 15.0 years (7.5 ttl pk-yrs)    Types: Cigarettes    Start date: 01/16/1999    Quit date: 01/15/2014    Years since quitting: 9.9   Smokeless tobacco: Never  Vaping Use   Vaping status: Former  Substance and Sexual Activity   Alcohol use: Not Currently    Comment: few glasses a week   Drug use: Never    Comment: marijuana medicinal patches on her back - 03/18/17 states she no longer uses this   Sexual activity: Not Currently  Other Topics Concern   Not on file  Social History Narrative   ** Merged History Encounter **       Social Drivers of Health   Financial Resource Strain: Low Risk  (10/25/2023)   Received from [facility]   Overall Financial Resource Strain (CARDIA)    Difficulty of Paying Living Expenses: Not hard at all  Food Insecurity: No Food Insecurity (12/07/2023)   Hunger Vital Sign    Worried About Running Out of Food in the Last Year: Never true    Ran Out of Food in the Last Year: Never true  Transportation Needs: No Transportation Needs (12/07/2023)   PRAPARE - Administrator, Civil Service (Medical): No    Lack of Transportation (Non-Medical): No  Physical Activity: Unknown (10/25/2023)   Received from [facility]   Exercise Vital Sign    Days of Exercise per Week: 0 days    Minutes of Exercise per Session: Not on file  Stress: No Stress Concern Present (10/25/2023)   Received from [facility] - Occupational Stress Questionnaire    Feeling of Stress : Not at all  Social Connections: Moderately Integrated (10/25/2023)   Received from [facility]   Social Network    How  would you rate your social network (family, work, friends)?: Adequate participation with social networks    MEDICATIONS:   Current Outpatient Medications  Medication Sig Dispense Refill   albuterol  (VENTOLIN  HFA) 108 (90 Base) MCG/ACT inhaler Inhale 2 puffs into the lungs every 6 (six) hours as needed for wheezing or shortness of breath. 8 g 6   aspirin  EC 81 MG tablet Take 1 tablet (81 mg total) by mouth daily. Swallow whole. 30 tablet 12   butalbital -acetaminophen -caffeine  (FIORICET ) 50-325-40 MG tablet Take 1 tablet by mouth every 6 (six) hours as needed for headache or migraine. 14 tablet 0   celecoxib (CELEBREX) 200 MG capsule Take 200 mg by mouth 2 (two) times daily as needed for mild pain.      Cholecalciferol (VITAMIN D3) 1.25 MG (50000 UT) CAPS Take 50,000 Units by mouth once a week.     fluticasone  furoate-vilanterol (BREO ELLIPTA ) 100-25 MCG/ACT AEPB Inhale 1 puff into the lungs daily. 30 each 5   gabapentin  (NEURONTIN ) 100 MG capsule Take 100 mg by mouth 4 (four) times daily as needed (for pain).     levothyroxine  (SYNTHROID , LEVOTHROID) 137 MCG tablet Take 137 mcg by mouth daily before breakfast.     lipase/protease/amylase (CREON) 12000-38000 units CPEP capsule Take 12,000 Units by mouth See admin instructions. Take two capsules by mouth  with each meal and 1 capsule by mouth with a snack per patient     omeprazole (PRILOSEC) 40 MG capsule Take 40 mg by mouth 2 (two) times daily.     ondansetron  (ZOFRAN ) 4 MG tablet Take 1 tablet (4 mg total) by mouth every 8 (eight) hours as needed for nausea or vomiting. 20 tablet 0   PARoxetine  (PAXIL ) 40 MG tablet Take 40 mg by mouth at bedtime.  3   promethazine  (PHENERGAN ) 25 MG tablet Take 1 tablet by mouth every 12 (twelve) hours as needed for nausea/vomiting.     rOPINIRole  (REQUIP ) 2 MG tablet Take 4 mg by mouth at bedtime.     rosuvastatin  (CRESTOR ) 10 MG tablet Take 10 mg by mouth daily.     Semaglutide ,0.25 or 0.5MG /DOS, 2 MG/3ML SOPN Inject 0.5 mg into the skin once a week. 9 mL 3   valACYclovir  (VALTREX ) 500 MG tablet Take 500 mg by mouth daily.       vitamin B-12 (CYANOCOBALAMIN ) 100 MCG tablet Take 100 mcg by mouth daily.     HYDROmorphone  HCl (EXALGO ) 8 MG TB24 Take 8 mg by mouth daily. Takes two tablets to equal 16mg  (Patient not taking: Reported on 01/03/2024)     insulin  aspart (NOVOLOG  FLEXPEN) 100 UNIT/ML FlexPen Take 15 units plus sliding scale with meals 3 times a day, maximum 60 units/day. 30 mL 11   insulin  glargine, 2 Unit Dial , (TOUJEO  MAX SOLOSTAR) 300 UNIT/ML Solostar Pen Inject 30 Units into the skin daily. 9 mL 3   losartan (COZAAR) 100 MG tablet Take 100 mg by mouth.     naloxone (NARCAN) 2 MG/2ML injection Inject 0.4 mg into the skin daily as needed (for overdose). (Patient not taking: Reported on 01/03/2024)     traMADol  (ULTRAM ) 50 MG tablet Take 1 tablet (50 mg total) by mouth every 6 (six) hours as needed. (Patient not taking: Reported on 01/03/2024) 15 tablet 0   No current facility-administered medications for this visit.    PHYSICAL EXAM: Vitals:   01/03/24 1433  BP: 120/60  Pulse: 88  SpO2: 96%  Weight: 199 lb 9.6 oz (90.5 kg)  Height:  5' 6.5" (1.689 m)   Body mass index is 31.73 kg/m.  Wt Readings from Last 3 Encounters:  01/03/24 199 lb 9.6 oz (90.5 kg)  12/07/23 201 lb 9.6 oz (91.4 kg)  11/10/23 198 lb (89.8 kg)    General: Well developed, well nourished female in no apparent distress.  HEENT: AT/[place], no external lesions.  Eyes: Conjunctiva clear and no icterus. Neck: Neck supple  Lungs: Respirations not labored Neurologic: Alert, oriented, normal speech Extremities / Skin: Dry.   Psychiatric: Does not appear depressed or anxious  Diabetic Foot Exam - Simple   No data filed     LABS Reviewed Lab Results  Component Value Date   HGBA1C 10.0 (H) 12/08/2023   HGBA1C 9.7 (A) 11/10/2023   HGBA1C 5.8 (H) 03/11/2017   No results found for: "FRUCTOSAMINE" Lab Results  Component Value Date   CHOL 200 12/08/2023   HDL 52 12/08/2023   LDLCALC 116 (H) 12/08/2023   LDLDIRECT 100.0 01/07/2015   TRIG  162 (H) 12/08/2023   CHOLHDL 3.8 12/08/2023   No results found for: "MICRALBCREAT" Lab Results  Component Value Date   CREATININE 0.76 12/09/2023   Lab Results  Component Value Date   GFR 67.09 04/26/2020    ASSESSMENT / PLAN  1. Uncontrolled type 2 diabetes mellitus with hyperglycemia (HCC)    Diabetes Mellitus type 2, complicated by no known complications. - Diabetic status / severity: Uncontrolled.  Lab Results  Component Value Date   HGBA1C 10.0 (H) 12/08/2023    - Hemoglobin A1c goal : <6.5%  Discussed about type 2 diabetes mellitus and importance of controlling blood sugar to prevent potential chronic diabetic complications including diabetic retinopathy, neuropathy and nephropathy.  Patient has mostly hyperglycemia.  Discussed about importance of controlling blood sugar.  Discussed about controlling and limiting carbohydrates.  Discussed about compliance with insulin  regimen.  About adding other antidiabetic medications for example SGLT2 inhibitor, patient does not want to try new medicine and wants to stay on multidose insulin  regimen only.  - Medications: See below.  Increase Toujeo  from 25 to 30 units daily in the morning. Take Humalog  15 units with meals 3 times a day before eating, plus sliding scale as follows.  Moderate Sliding Scale Blood Glucose        Insulin  60-150                     None 151-200                   3 units 201-250                   5 units 251-300                   7 units 301-350                   9 units 351-400                  11 units    >400                       12 units and call provider  Advised to take Humalog  5 units for snacks.  - Home glucose testing: CGM and check as needed.  - Discussed/ Gave Hypoglycemia treatment plan.  # Consult : not required at this time.   # Annual urine for microalbuminuria/ creatinine ratio, no microalbuminuria currently. Last No  results found for: "MICRALBCREAT"  # Foot check  nightly.  # Annual dilated diabetic eye exams.   - Diet: Make healthy diabetic food choices - Life style / activity / exercise: Discussed.  2. Blood pressure  -  BP Readings from Last 1 Encounters:  01/03/24 120/60    - Control is in target.  - No change in current plans.  3. Lipid status / Hyperlipidemia - Last  Lab Results  Component Value Date   LDLCALC 116 (H) 12/08/2023   - Continue atorvastatin, managed by primary care provider.  # Primary hypothyroidism -Current on levothyroxine , managed by primary care provider.  Diagnoses and all orders for this visit:  Uncontrolled type 2 diabetes mellitus with hyperglycemia (HCC) -     insulin  glargine, 2 Unit Dial , (TOUJEO  MAX SOLOSTAR) 300 UNIT/ML Solostar Pen; Inject 30 Units into the skin daily. -     insulin  aspart (NOVOLOG  FLEXPEN) 100 UNIT/ML FlexPen; Take 15 units plus sliding scale with meals 3 times a day, maximum 60 units/day.     DISPOSITION Follow up in clinic in 2 months suggested.   All questions answered and patient verbalized understanding of the plan.  [place]ra[place] Senita Corredor, MD [facility] Endocrinology [facility] [address] [place], [address] [place], [place] [address] Phone # [phone]  At least part of this note was generated using voice recognition software. Inadvertent word errors may have occurred, which were not recognized during the proofreading process.

## 2024-01-04 ENCOUNTER — Encounter: Payer: Self-pay | Admitting: Endocrinology

## 2024-01-13 DIAGNOSIS — I10 Essential (primary) hypertension: Secondary | ICD-10-CM | POA: Diagnosis not present

## 2024-01-13 DIAGNOSIS — G2581 Restless legs syndrome: Secondary | ICD-10-CM | POA: Diagnosis not present

## 2024-01-13 DIAGNOSIS — M450 Ankylosing spondylitis of multiple sites in spine: Secondary | ICD-10-CM | POA: Diagnosis not present

## 2024-01-13 DIAGNOSIS — R200 Anesthesia of skin: Secondary | ICD-10-CM | POA: Diagnosis not present

## 2024-01-18 DIAGNOSIS — I10 Essential (primary) hypertension: Secondary | ICD-10-CM | POA: Diagnosis not present

## 2024-01-18 DIAGNOSIS — E1122 Type 2 diabetes mellitus with diabetic chronic kidney disease: Secondary | ICD-10-CM | POA: Diagnosis not present

## 2024-01-18 DIAGNOSIS — E1165 Type 2 diabetes mellitus with hyperglycemia: Secondary | ICD-10-CM | POA: Diagnosis not present

## 2024-01-18 DIAGNOSIS — N1831 Chronic kidney disease, stage 3a: Secondary | ICD-10-CM | POA: Diagnosis not present

## 2024-01-25 ENCOUNTER — Other Ambulatory Visit: Payer: Self-pay

## 2024-01-25 ENCOUNTER — Encounter: Payer: Self-pay | Admitting: Endocrinology

## 2024-01-25 DIAGNOSIS — E1165 Type 2 diabetes mellitus with hyperglycemia: Secondary | ICD-10-CM

## 2024-01-26 DIAGNOSIS — M459 Ankylosing spondylitis of unspecified sites in spine: Secondary | ICD-10-CM | POA: Diagnosis not present

## 2024-02-01 ENCOUNTER — Other Ambulatory Visit: Payer: Self-pay

## 2024-02-01 DIAGNOSIS — E1165 Type 2 diabetes mellitus with hyperglycemia: Secondary | ICD-10-CM

## 2024-02-01 MED ORDER — DEXCOM G7 RECEIVER DEVI: 1.0000 | Status: DC

## 2024-02-02 DIAGNOSIS — G4733 Obstructive sleep apnea (adult) (pediatric): Secondary | ICD-10-CM | POA: Diagnosis not present

## 2024-02-17 DIAGNOSIS — I10 Essential (primary) hypertension: Secondary | ICD-10-CM | POA: Diagnosis not present

## 2024-02-17 DIAGNOSIS — E1122 Type 2 diabetes mellitus with diabetic chronic kidney disease: Secondary | ICD-10-CM | POA: Diagnosis not present

## 2024-02-17 DIAGNOSIS — E1165 Type 2 diabetes mellitus with hyperglycemia: Secondary | ICD-10-CM | POA: Diagnosis not present

## 2024-02-17 DIAGNOSIS — N1831 Chronic kidney disease, stage 3a: Secondary | ICD-10-CM | POA: Diagnosis not present

## 2024-02-25 ENCOUNTER — Encounter: Payer: Self-pay | Admitting: Endocrinology

## 2024-02-28 ENCOUNTER — Other Ambulatory Visit: Payer: Self-pay

## 2024-02-28 DIAGNOSIS — N1831 Chronic kidney disease, stage 3a: Secondary | ICD-10-CM | POA: Diagnosis not present

## 2024-02-28 DIAGNOSIS — I10 Essential (primary) hypertension: Secondary | ICD-10-CM | POA: Diagnosis not present

## 2024-02-28 DIAGNOSIS — E1122 Type 2 diabetes mellitus with diabetic chronic kidney disease: Secondary | ICD-10-CM | POA: Diagnosis not present

## 2024-02-28 DIAGNOSIS — E1165 Type 2 diabetes mellitus with hyperglycemia: Secondary | ICD-10-CM

## 2024-02-28 DIAGNOSIS — E039 Hypothyroidism, unspecified: Secondary | ICD-10-CM | POA: Diagnosis not present

## 2024-02-28 MED ORDER — DEXCOM G7 SENSOR MISC: 1 refills | Status: DC

## 2024-02-28 MED ORDER — DEXCOM G7 RECEIVER DEVI: 1.0000 | 1 refills | Status: AC

## 2024-03-06 ENCOUNTER — Emergency Department (HOSPITAL_BASED_OUTPATIENT_CLINIC_OR_DEPARTMENT_OTHER)

## 2024-03-06 ENCOUNTER — Inpatient Hospital Stay (HOSPITAL_BASED_OUTPATIENT_CLINIC_OR_DEPARTMENT_OTHER)
Admission: EM | Admit: 2024-03-06 | Discharge: 2024-03-08 | DRG: 103 | Disposition: A | Discharge status: Home / Self Care | Attending: Neurology | Admitting: Neurology

## 2024-03-06 ENCOUNTER — Other Ambulatory Visit: Payer: Self-pay

## 2024-03-06 ENCOUNTER — Encounter (HOSPITAL_BASED_OUTPATIENT_CLINIC_OR_DEPARTMENT_OTHER): Payer: Self-pay

## 2024-03-06 DIAGNOSIS — Z87891 Personal history of nicotine dependence: Secondary | ICD-10-CM | POA: Diagnosis not present

## 2024-03-06 DIAGNOSIS — M797 Fibromyalgia: Secondary | ICD-10-CM | POA: Diagnosis present

## 2024-03-06 DIAGNOSIS — E785 Hyperlipidemia, unspecified: Secondary | ICD-10-CM | POA: Diagnosis present

## 2024-03-06 DIAGNOSIS — Z79899 Other long term (current) drug therapy: Secondary | ICD-10-CM

## 2024-03-06 DIAGNOSIS — E039 Hypothyroidism, unspecified: Secondary | ICD-10-CM | POA: Diagnosis present

## 2024-03-06 DIAGNOSIS — Z7982 Long term (current) use of aspirin: Secondary | ICD-10-CM

## 2024-03-06 DIAGNOSIS — D869 Sarcoidosis, unspecified: Secondary | ICD-10-CM | POA: Diagnosis not present

## 2024-03-06 DIAGNOSIS — Z7985 Long-term (current) use of injectable non-insulin antidiabetic drugs: Secondary | ICD-10-CM

## 2024-03-06 DIAGNOSIS — Z794 Long term (current) use of insulin: Secondary | ICD-10-CM

## 2024-03-06 DIAGNOSIS — Z801 Family history of malignant neoplasm of trachea, bronchus and lung: Secondary | ICD-10-CM

## 2024-03-06 DIAGNOSIS — K219 Gastro-esophageal reflux disease without esophagitis: Secondary | ICD-10-CM | POA: Diagnosis present

## 2024-03-06 DIAGNOSIS — I639 Cerebral infarction, unspecified: Principal | ICD-10-CM | POA: Diagnosis present

## 2024-03-06 DIAGNOSIS — R519 Headache, unspecified: Principal | ICD-10-CM

## 2024-03-06 DIAGNOSIS — R29818 Other symptoms and signs involving the nervous system: Secondary | ICD-10-CM | POA: Diagnosis not present

## 2024-03-06 DIAGNOSIS — Z6832 Body mass index (BMI) 32.0-32.9, adult: Secondary | ICD-10-CM

## 2024-03-06 DIAGNOSIS — I10 Essential (primary) hypertension: Secondary | ICD-10-CM | POA: Diagnosis present

## 2024-03-06 DIAGNOSIS — Z809 Family history of malignant neoplasm, unspecified: Secondary | ICD-10-CM

## 2024-03-06 DIAGNOSIS — Z833 Family history of diabetes mellitus: Secondary | ICD-10-CM | POA: Diagnosis not present

## 2024-03-06 DIAGNOSIS — Z560 Unemployment, unspecified: Secondary | ICD-10-CM | POA: Diagnosis not present

## 2024-03-06 DIAGNOSIS — G2581 Restless legs syndrome: Secondary | ICD-10-CM | POA: Diagnosis present

## 2024-03-06 DIAGNOSIS — Z7951 Long term (current) use of inhaled steroids: Secondary | ICD-10-CM

## 2024-03-06 DIAGNOSIS — Z79890 Hormone replacement therapy (postmenopausal): Secondary | ICD-10-CM

## 2024-03-06 DIAGNOSIS — R531 Weakness: Secondary | ICD-10-CM | POA: Diagnosis not present

## 2024-03-06 DIAGNOSIS — Z888 Allergy status to other drugs, medicaments and biological substances status: Secondary | ICD-10-CM | POA: Diagnosis not present

## 2024-03-06 DIAGNOSIS — J4489 Other specified chronic obstructive pulmonary disease: Secondary | ICD-10-CM | POA: Diagnosis present

## 2024-03-06 DIAGNOSIS — G8929 Other chronic pain: Secondary | ICD-10-CM | POA: Diagnosis present

## 2024-03-06 DIAGNOSIS — E669 Obesity, unspecified: Secondary | ICD-10-CM | POA: Diagnosis not present

## 2024-03-06 DIAGNOSIS — R29701 NIHSS score 1: Secondary | ICD-10-CM | POA: Diagnosis not present

## 2024-03-06 DIAGNOSIS — Z885 Allergy status to narcotic agent status: Secondary | ICD-10-CM

## 2024-03-06 DIAGNOSIS — E1165 Type 2 diabetes mellitus with hyperglycemia: Secondary | ICD-10-CM | POA: Diagnosis present

## 2024-03-06 DIAGNOSIS — H5461 Unqualified visual loss, right eye, normal vision left eye: Secondary | ICD-10-CM | POA: Diagnosis present

## 2024-03-06 DIAGNOSIS — Z9682 Presence of neurostimulator: Secondary | ICD-10-CM

## 2024-03-06 DIAGNOSIS — M549 Dorsalgia, unspecified: Secondary | ICD-10-CM | POA: Diagnosis present

## 2024-03-06 DIAGNOSIS — Z882 Allergy status to sulfonamides status: Secondary | ICD-10-CM

## 2024-03-06 DIAGNOSIS — F32A Depression, unspecified: Secondary | ICD-10-CM | POA: Diagnosis present

## 2024-03-06 DIAGNOSIS — G43109 Migraine with aura, not intractable, without status migrainosus: Secondary | ICD-10-CM | POA: Diagnosis present

## 2024-03-06 DIAGNOSIS — Z8249 Family history of ischemic heart disease and other diseases of the circulatory system: Secondary | ICD-10-CM

## 2024-03-06 DIAGNOSIS — Z808 Family history of malignant neoplasm of other organs or systems: Secondary | ICD-10-CM

## 2024-03-06 DIAGNOSIS — Z791 Long term (current) use of non-steroidal anti-inflammatories (NSAID): Secondary | ICD-10-CM

## 2024-03-06 LAB — DIFFERENTIAL
Abs Immature Granulocytes: 0.04 K/uL (ref 0.00–0.07)
Basophils Absolute: 0 K/uL (ref 0.0–0.1)
Basophils Relative: 1 %
Eosinophils Absolute: 0.2 K/uL (ref 0.0–0.5)
Eosinophils Relative: 3 %
Immature Granulocytes: 1 %
Lymphocytes Relative: 33 %
Lymphs Abs: 2.2 K/uL (ref 0.7–4.0)
Monocytes Absolute: 0.7 K/uL (ref 0.1–1.0)
Monocytes Relative: 10 %
Neutro Abs: 3.6 K/uL (ref 1.7–7.7)
Neutrophils Relative %: 52 %

## 2024-03-06 LAB — PROTIME-INR
INR: 1 (ref 0.8–1.2)
Prothrombin Time: 13.3 s (ref 11.4–15.2)

## 2024-03-06 LAB — CBC
HCT: 37 % (ref 36.0–46.0)
Hemoglobin: 12.8 g/dL (ref 12.0–15.0)
MCH: 29.3 pg (ref 26.0–34.0)
MCHC: 34.6 g/dL (ref 30.0–36.0)
MCV: 84.7 fL (ref 80.0–100.0)
Platelets: 276 K/uL (ref 150–400)
RBC: 4.37 MIL/uL (ref 3.87–5.11)
RDW: 13.6 % (ref 11.5–15.5)
WBC: 6.8 K/uL (ref 4.0–10.5)
nRBC: 0 % (ref 0.0–0.2)

## 2024-03-06 LAB — COMPREHENSIVE METABOLIC PANEL WITH GFR
ALT: 32 U/L (ref 0–44)
AST: 26 U/L (ref 15–41)
Albumin: 4.4 g/dL (ref 3.5–5.0)
Alkaline Phosphatase: 133 U/L — ABNORMAL HIGH (ref 38–126)
Anion gap: 12 (ref 5–15)
BUN: 14 mg/dL (ref 6–20)
CO2: 22 mmol/L (ref 22–32)
Calcium: 10.5 mg/dL — ABNORMAL HIGH (ref 8.9–10.3)
Chloride: 103 mmol/L (ref 98–111)
Creatinine, Ser: 0.84 mg/dL (ref 0.44–1.00)
GFR, Estimated: >60 mL/min (ref >60)
Glucose, Bld: 259 mg/dL — ABNORMAL HIGH (ref 70–99)
Potassium: 4 mmol/L (ref 3.5–5.1)
Sodium: 137 mmol/L (ref 135–145)
Total Bilirubin: 0.3 mg/dL (ref 0.0–1.2)
Total Protein: 6.9 g/dL (ref 6.5–8.1)

## 2024-03-06 LAB — CBG MONITORING, ED: Glucose-Capillary: 281 mg/dL — ABNORMAL HIGH (ref 70–99)

## 2024-03-06 LAB — APTT: aPTT: 24 s (ref 24–36)

## 2024-03-06 LAB — ETHANOL: Alcohol, Ethyl (B): <15 mg/dL (ref <15)

## 2024-03-06 MED ORDER — LACTATED RINGERS IV BOLUS
1000.0000 mL | Freq: Once | INTRAVENOUS | Status: AC
  Administered 2024-03-06: 1000 mL via INTRAVENOUS

## 2024-03-06 MED ORDER — TENECTEPLASE FOR STROKE
PACK | INTRAVENOUS | Status: AC
  Administered 2024-03-06: 23 mg via INTRAVENOUS
  Filled 2024-03-06: qty 10

## 2024-03-06 MED ORDER — IOHEXOL 350 MG/ML SOLN
75.0000 mL | Freq: Once | INTRAVENOUS | Status: AC | PRN
Start: 2024-03-06 — End: 2024-03-06
  Administered 2024-03-06: 75 mL via INTRAVENOUS

## 2024-03-06 MED ORDER — PROCHLORPERAZINE EDISYLATE 10 MG/2ML IJ SOLN
10.0000 mg | Freq: Once | INTRAMUSCULAR | Status: AC
  Administered 2024-03-06: 10 mg via INTRAVENOUS
  Filled 2024-03-06: qty 2

## 2024-03-06 MED ORDER — TENECTEPLASE FOR STROKE: 0.2500 mg/kg | PACK | Freq: Once | INTRAVENOUS | Status: AC

## 2024-03-06 MED ORDER — DIPHENHYDRAMINE HCL 50 MG/ML IJ SOLN
25.0000 mg | Freq: Once | INTRAMUSCULAR | Status: AC
  Administered 2024-03-06: 25 mg via INTRAVENOUS
  Filled 2024-03-06: qty 1

## 2024-03-06 MED ORDER — SODIUM CHLORIDE 0.9% FLUSH: 3.0000 mL | Freq: Once | INTRAVENOUS | Status: DC

## 2024-03-06 NOTE — ED Triage Notes (Signed)
 Pt presents via POV c/o hypertension, headache, and dizziness starting at 1930.   Reports diminished sensation on the left side. Reports generalized weakness.

## 2024-03-06 NOTE — Progress Notes (Signed)
 eLink Physician-Brief Progress Note Patient Name: [name] DOB: May 07, 1966 MRN: [id number]   Date of Service  03/06/2024  HPI/Events of Note  eICU Brief new admit note: 73 F with hx of HTN, DM, seizures, Migraine headaches, no prior CVA presented with nausea, left weakness, right vision loss, with severe headache s/p TNK at 2115, per neuro service. Follow plan of care.   Data: Reviewed  CTA : No emergent large vessel occlusion or proximal hemodynamically significant stenosis.  LFT normal Cr normal CBC normal INR 1 EKG normal sinus, no acute changes.  Camera: SBP 168, MAP 119.  Sats 97% on room air. Afebrile. She is awake and alert, no slurring of speech.    eICU Interventions  Continue Neuro plan of care Aspiration, sz precautions Neuro checks CBG goals < 180. Ssi. VTE prophylaxis.      Intervention Category Major Interventions: Change in mental status - evaluation and management;Other: Evaluation Type: New Patient Evaluation  Jodelle ONEIDA Hutching 03/06/2024, 11:47 PM

## 2024-03-06 NOTE — Consult Note (Addendum)
 Advanced Imaging:  CTA Head and Neck Completed.  LVO:No  [facility] TeleNeurology Consult Services   Patient Name:   [name], [name] Date of Birth:   07-Sep-1965 Identification Number:   MRN - [id number] Date of Service:   03/06/2024 20:36:57  Diagnosis:       I63.89 - Cerebrovascular accident (CVA) due to other mechanism (HCCC)       G44.59 - Other complicated headache syndrome  Impression:      68 F presented with nausea, left weakness, right vision loss, with severe headache. Symptoms can be 2/2 recrudescence of prior stroke symptoms, new CVA, complicated headache syndrome. Thrombolytic risk vs benefit ratio has been discussed in detail with the patient and fianc at bedside and they agree to receiving the medication and will voice concerns about any new neurological symptoms or headaches if they occur to staff immediately. Long discussion about how symptoms could be 2/2 atypical headache and we could treat the headache first to see if symptoms resolved or improved but patient would like to be treated for possible CVA sooner given family history of CVA in her mother. Will need to admit for post thrombolytic care. CTA H&N pending to discern if they are an IR candidate. I will follow this study up (but please call if you receive a concerning radiology report before I am able to follow up by calling [facility] for a STAT Dr to Dr) so that I may facilitate IR engagement STAT if necessary.  Our recommendations are outlined below. Recommendations: IV Tenecteplase  recommended.  I confirmed the following. (Patient name, DOB, MRN, Blood Pressure, dose of Thrombolytic and waste, weight completed by stretcher/scale not stated weight, have ED staff inform ED MD of thrombolytic decision) Thrombolytic bolus given Without Complication.   IV Tenecteplase  Total Dose - 23.0 mg (Dose Rounding Per Facility Protocol)   Routine post Thrombolytic monitoring including neuro  checks and blood pressure control during/after treatment Monitor blood pressure Check blood pressure and neuro assessment every 15 min for 2 h, then every 30 min for 6 h, and finally every hour for 16 h.  Manage Blood Pressure per post Thrombolytic protocol.        Follow designated hospital protocol for admission and post thrombolytic care       CT brain 24 hours post Thrombolytic       NPO until swallowing screen performed and passed       No antiplatelet agents or anticoagulants (including heparin  for DVT prophylaxis) in first 24 hours       No Foley catheter, nasogastric tube, arterial catheter or central venous catheter for 24 hr, unless absolutely necessary       Telemetry       Bedside swallow evaluation       HOB less than 30 degrees       Euglycemia       Avoid hyperthermia, PRN acetaminophen        DVT prophylaxis       Inpatient Neurology Consultation       Stroke evaluation as per inpatient neurology recommendations  Discussed with ED physician    ------------------------------------------------------------------------------  Advanced Imaging: Advanced imaging has been ordered. Results pending.   Metrics: Last Known Well: 03/06/2024 19:30:00 Dispatch Time: 03/06/2024 20:36:57 Arrival Time: 03/06/2024 20:15:00 Initial Response Time: 03/06/2024 20:41:19 Symptoms: nausea, left weakness, right vision loss, with severe headache. Initial patient interaction: 03/06/2024 20:45:03 NIHSS Assessment Completed: 03/06/2024 20:54:12 Patient is a candidate for Thrombolytic. Thrombolytic Medical Decision: 03/06/2024 20:59:46 Needle Time: 03/06/2024  21:20:40 Weight Noted by Staff: 91.9 kg  CT Head: I personally reviewed all the CT images that were available to me and it showed: no acute pathology  Primary Provider Notified of Diagnostic Impression and Management Plan on: 03/06/2024  21:15:24    ------------------------------------------------------------------------------  Extended thrombolytic management related to:     More extensive discussion on the management decision was requested by patient or family  Thrombolytic Contraindications:  Last Known Well > 4.5 hours: No CT Head showing hemorrhage: No Ischemic stroke within 3 months: No Severe head trauma within 3 months: No Intracranial/intraspinal surgery within 3 months: No History of intracranial hemorrhage: No Symptoms and signs consistent with an SAH: No GI malignancy or GI bleed within 21 days: No Coagulopathy: Platelets <100 000 /mm3, INR >1.7, aPTT>40 s, or PT >15 s: No Treatment dose of LMWH within the previous 24 hrs: No Use of NOACs in past 48 hours: No Glycoprotein IIb/IIIa receptor inhibitors use: No Symptoms consistent with infective endocarditis: No Suspected aortic arch dissection: No Intra-axial intracranial neoplasm: No  Thrombolytic Decision and Management Plan: Management with thrombolytic treatment was explained to the Patient as was risks and benefits and alternatives to the treatment. Patient agrees with the decision to proceed with thrombolytic treatment. . All questions were answered and the Patient expressed understanding of the treatment plan.   History of Present Illness: Patient is a 58 year old Female.  Patient was brought by private transportation with symptoms of nausea, left weakness, right vision loss, with severe headache. 31 F presented with nausea, left weakness, right vision loss, with severe headache.  At 5:15 BP was 187/105. At 7:30 it was the same then had a severe headache. Doesn't normally get headache. No sensitivity to light or sound. Has nausea. Has dizziness. Has weakness on the left side at the same time as headache at 7:30 am. No trouble walking today but just felt week. She doesn't feel like this is like her normal headache.  Admitted for TIA in with  exactly the same symptoms per family.  HCT 4/9 concerned for possible small acute right temporo-occipital cortically-based infarct (versus volume averaging).   Past Medical History:      Hypertension      Diabetes Mellitus      Seizures      Migraine Headaches      There is no history of Stroke Other PMH:  TIA in April, left weakness, hypothyroidism, episodes of left weakness which she is unable to elaborate on, mid back neurostimulator and her neurologist spoke with [facility] scientific and she can only have the top of her head scanned so essentially she can't have an MRI brain  Medications:  No Anticoagulant use  Antiplatelet use: Yes ASA 81 mg Reviewed EMR for current medications  Allergies:  Reviewed  Social History: Smoking: No Alcohol Use: No Drug Use: No  Family History:  There is no family history of premature cerebrovascular disease pertinent to this consultation  ROS : 14 Points Review of Systems was performed and was negative except mentioned in HPI.  Past Surgical History: There Is No Surgical History Contributory To Today's Visit   Examination: BP(143/90), Pulse(72), Blood Glucose(281) 1A: Level of Consciousness - Alert; keenly responsive + 0 1B: Ask Month and Age - Both Questions Right + 0 1C: Blink Eyes & Squeeze Hands - Performs Both Tasks + 0 2: Test Horizontal Extraocular Movements - Normal + 0 3: Test Visual Fields - Patient is Bilaterally Blind + 3 4: Test Facial Palsy (  Use Grimace if Obtunded) - Normal symmetry + 0 5A: Test Left Arm Motor Drift - Drift, but doesn't hit bed + 1 5B: Test Right Arm Motor Drift - No Drift for 10 Seconds + 0 6A: Test Left Leg Motor Drift - No Drift for 5 Seconds + 0 6B: Test Right Leg Motor Drift - No Drift for 5 Seconds + 0 7: Test Limb Ataxia (FNF/Heel-Shin) - No Ataxia + 0 8: Test Sensation - Mild-Moderate Loss: Less Sharp/More Dull + 1 9: Test Language/Aphasia - Normal; No aphasia + 0 10: Test Dysarthria  - Normal + 0 11: Test Extinction/Inattention - No abnormality + 0  NIHSS Score: 5 NIHSS Free Text : vision changes in right eye  Pre-Morbid Modified Rankin Scale: 2 Points = Slight disability; unable to carry out all previous activities, but able to look after own affairs without assistance  Spoke with : Dr. Clarinda I reviewed the available imaging via Rapid and initiated discussion with the primary provider  This consult was conducted in real time using interactive audio and video technology. Patient was informed of the technology being used for this visit and agreed to proceed. Patient located in hospital and provider located at home/office setting.   Patient is being evaluated for possible acute neurologic impairment and high probability of imminent or life-threatening deterioration. I spent total of 59 minutes providing care to this patient, including time for face to face visit via telemedicine, review of medical records, imaging studies and discussion of findings with providers, the patient and/or family.   Dr Idelia Haws   [facility] For Inpatient follow-up with [facility] physician please call [facility] at [phone]. As we are not an outpatient service for any post hospital discharge needs please contact the hospital for assistance.  If you have any questions for the [facility] physicians or need to reconsult for clinical or diagnostic changes please contact us  via [facility] at [phone].

## 2024-03-07 ENCOUNTER — Inpatient Hospital Stay (HOSPITAL_COMMUNITY)

## 2024-03-07 DIAGNOSIS — E785 Hyperlipidemia, unspecified: Secondary | ICD-10-CM

## 2024-03-07 DIAGNOSIS — R29818 Other symptoms and signs involving the nervous system: Secondary | ICD-10-CM

## 2024-03-07 DIAGNOSIS — I639 Cerebral infarction, unspecified: Secondary | ICD-10-CM | POA: Diagnosis not present

## 2024-03-07 DIAGNOSIS — R29701 NIHSS score 1: Secondary | ICD-10-CM | POA: Diagnosis not present

## 2024-03-07 DIAGNOSIS — G43109 Migraine with aura, not intractable, without status migrainosus: Secondary | ICD-10-CM | POA: Diagnosis not present

## 2024-03-07 LAB — GLUCOSE, CAPILLARY
Glucose-Capillary: 256 mg/dL — ABNORMAL HIGH (ref 70–99)
Glucose-Capillary: 214 mg/dL — ABNORMAL HIGH (ref 70–99)
Glucose-Capillary: 281 mg/dL — ABNORMAL HIGH (ref 70–99)

## 2024-03-07 LAB — LIPID PANEL
Cholesterol: 164 mg/dL (ref 0–200)
HDL: 49 mg/dL (ref >40)
LDL Cholesterol: 90 mg/dL (ref 0–99)
Total CHOL/HDL Ratio: 3.3 ratio
Triglycerides: 125 mg/dL (ref <150)
VLDL: 25 mg/dL (ref 0–40)

## 2024-03-07 LAB — HEMOGLOBIN A1C
Hgb A1c MFr Bld: 10.4 % — ABNORMAL HIGH (ref 4.8–5.6)
Mean Plasma Glucose: 251.78 mg/dL

## 2024-03-07 LAB — RAPID URINE DRUG SCREEN, HOSP PERFORMED
Amphetamines: NOT DETECTED
Barbiturates: NOT DETECTED
Benzodiazepines: NOT DETECTED
Cocaine: NOT DETECTED
Opiates: NOT DETECTED
Tetrahydrocannabinol: NOT DETECTED

## 2024-03-07 LAB — MRSA NEXT GEN BY PCR, NASAL: MRSA by PCR Next Gen: NOT DETECTED

## 2024-03-07 MED ORDER — SODIUM CHLORIDE 0.9 % IV SOLN: INTRAVENOUS | Status: AC

## 2024-03-07 MED ORDER — CHLORHEXIDINE GLUCONATE CLOTH 2 % EX PADS
6.0000 | MEDICATED_PAD | Freq: Every day | CUTANEOUS | Status: DC
  Administered 2024-03-07 – 2024-03-08 (×2): 6 via TOPICAL

## 2024-03-07 MED ORDER — PAROXETINE HCL 20 MG PO TABS
40.0000 mg | ORAL_TABLET | Freq: Every day | ORAL | Status: DC
  Administered 2024-03-07: 40 mg via ORAL
  Filled 2024-03-07 (×2): qty 2

## 2024-03-07 MED ORDER — ALBUTEROL SULFATE (2.5 MG/3ML) 0.083% IN NEBU: 2.5000 mg | INHALATION_SOLUTION | Freq: Four times a day (QID) | RESPIRATORY_TRACT | Status: DC | PRN

## 2024-03-07 MED ORDER — BUTALBITAL-APAP-CAFFEINE 50-325-40 MG PO TABS
1.0000 | ORAL_TABLET | Freq: Every day | ORAL | Status: DC | PRN
  Administered 2024-03-07 – 2024-03-08 (×4): 1 via ORAL
  Filled 2024-03-07 (×4): qty 1

## 2024-03-07 MED ORDER — STROKE: EARLY STAGES OF RECOVERY BOOK
Freq: Once | Status: AC
Start: 2024-03-08 — End: 2024-03-08
  Filled 2024-03-07: qty 1

## 2024-03-07 MED ORDER — PANTOPRAZOLE SODIUM 40 MG IV SOLR
40.0000 mg | Freq: Every day | INTRAVENOUS | Status: DC
  Administered 2024-03-07: 40 mg via INTRAVENOUS
  Filled 2024-03-07: qty 10

## 2024-03-07 MED ORDER — VALACYCLOVIR HCL 500 MG PO TABS
500.0000 mg | ORAL_TABLET | Freq: Every day | ORAL | Status: DC
  Administered 2024-03-07 – 2024-03-08 (×2): 500 mg via ORAL
  Filled 2024-03-07 (×2): qty 1

## 2024-03-07 MED ORDER — GABAPENTIN 100 MG PO CAPS
100.0000 mg | ORAL_CAPSULE | Freq: Four times a day (QID) | ORAL | Status: DC | PRN
  Administered 2024-03-07 – 2024-03-08 (×3): 100 mg via ORAL
  Filled 2024-03-07 (×3): qty 1

## 2024-03-07 MED ORDER — CLEVIDIPINE BUTYRATE 0.5 MG/ML IV EMUL: 0.0000 mg/h | INTRAVENOUS | Status: DC

## 2024-03-07 MED ORDER — INSULIN ASPART 100 UNIT/ML IJ SOLN
0.0000 [IU] | Freq: Three times a day (TID) | INTRAMUSCULAR | Status: DC
  Administered 2024-03-07: 8 [IU] via SUBCUTANEOUS
  Administered 2024-03-07: 5 [IU] via SUBCUTANEOUS
  Administered 2024-03-08: 3 [IU] via SUBCUTANEOUS
  Administered 2024-03-08: 5 [IU] via SUBCUTANEOUS

## 2024-03-07 MED ORDER — ROSUVASTATIN CALCIUM 5 MG PO TABS
10.0000 mg | ORAL_TABLET | Freq: Every day | ORAL | Status: DC
  Administered 2024-03-07 – 2024-03-08 (×2): 10 mg via ORAL
  Filled 2024-03-07 (×2): qty 2

## 2024-03-07 MED ORDER — PANTOPRAZOLE SODIUM 40 MG PO TBEC
40.0000 mg | DELAYED_RELEASE_TABLET | Freq: Every day | ORAL | Status: DC
  Administered 2024-03-07 – 2024-03-08 (×2): 40 mg via ORAL
  Filled 2024-03-07 (×2): qty 1

## 2024-03-07 MED ORDER — LEVOTHYROXINE SODIUM 25 MCG PO TABS
137.0000 ug | ORAL_TABLET | Freq: Every day | ORAL | Status: DC
  Administered 2024-03-07 – 2024-03-08 (×2): 137 ug via ORAL
  Filled 2024-03-07 (×2): qty 1

## 2024-03-07 MED ORDER — ORAL CARE MOUTH RINSE: 15.0000 mL | OROMUCOSAL | Status: DC | PRN

## 2024-03-07 MED ORDER — VITAMIN B-12 100 MCG PO TABS
100.0000 ug | ORAL_TABLET | Freq: Every day | ORAL | Status: DC
  Administered 2024-03-07 – 2024-03-08 (×2): 100 ug via ORAL
  Filled 2024-03-07 (×2): qty 1

## 2024-03-07 MED ORDER — SENNOSIDES-DOCUSATE SODIUM 8.6-50 MG PO TABS: 1.0000 | ORAL_TABLET | Freq: Every evening | ORAL | Status: DC | PRN

## 2024-03-07 MED ORDER — FENTANYL CITRATE PF 50 MCG/ML IJ SOSY
12.5000 ug | PREFILLED_SYRINGE | Freq: Once | INTRAMUSCULAR | Status: AC
  Administered 2024-03-07: 12.5 ug via INTRAVENOUS
  Filled 2024-03-07: qty 1

## 2024-03-07 MED ORDER — ROPINIROLE HCL 1 MG PO TABS
4.0000 mg | ORAL_TABLET | Freq: Every day | ORAL | Status: DC
  Administered 2024-03-07 (×2): 4 mg via ORAL
  Filled 2024-03-07 (×3): qty 4

## 2024-03-07 NOTE — Progress Notes (Incomplete)
 eLink Physician-Brief Progress Note Patient Name: [name] DOB: 05-22-1966 MRN: [id number]   Date of Service  03/06/2024  HPI/Events of Note  eICU Brief new admit note: 89 F with hx of HTN, DM, seizures, Migraine headaches, no prior CVA presented with nausea, left weakness, right vision loss, with severe headache s/p TNK at 2115, per neuro service. Follow plan of care.   Data: Reviewed  Camera: SBP 168, MAP 119.  Sats 97% on room air. Afebrile. She is awake and alert, no slurring of speech.    eICU Interventions  Continue Neuro plan of care Aspiration, sz precautions Neuro checks CBG goals < 180. Ssi. VTE prophylaxis.      Intervention Category Major Interventions: Change in mental status - evaluation and management;Other: Evaluation Type: New Patient Evaluation  Jodelle ONEIDA Hutching 03/06/2024, 11:47 PM

## 2024-03-07 NOTE — Inpatient Diabetes Management (Signed)
 Inpatient Diabetes Program Recommendations  AACE/ADA: New Consensus Statement on Inpatient Glycemic Control (2015)  Target Ranges:  Prepandial:   less than 140 mg/dL      Peak postprandial:   less than 180 mg/dL (1-2 hours)      Critically ill patients:  140 - 180 mg/dL   Lab Results  Component Value Date   GLUCAP 256 (H) 03/07/2024   HGBA1C 10.4 (H) 03/07/2024    Review of Glycemic Control  Diabetes history: DM 2 Outpatient Diabetes medications: Toujeo  30 units Daily, Novolog  24-28 units tid Current orders for Inpatient glycemic control:  Novolog  0-15 units tid A1c 10.4% on 7/8 Endocrinologist Dr. Isaias Sees them every 3 months  Inpatient Diabetes Program Recommendations:    -   Start Semglee  10 units  Spoke with pt at bedside regarding A1c level and glucose control at home. Discussed glucose and A1c goals. Pt  just had recent medication increases the last time she went to see her Endocrinologist. Pt is familiar with lifestyle modifications and will attempt to refocus on improving it.  Thanks,  Clotilda Bull RN, MSN, BC-ADM Inpatient Diabetes Coordinator Team Pager [phone] (8a-5p)

## 2024-03-07 NOTE — H&P (Signed)
 NEUROLOGY H&P NOTE   Date of service: March 07, 2024 Patient Name: [name] MRN:  [id number] DOB:  Jan 22, 1966 Chief Complaint: Acute onset of headache with left sided numbness and weakness in addition to right sided vision loss.   History of Present Illness  [name] is a 58 y.o. female with an extensive PMHx as documented below (reviewed), including complicated migraine, HTN, DM, seizures, fibromyalgia, spinal cord stimulator placement and chronic pain, who initially presented to [facility] with acute onset of headache with left sided numbness and weakness in addition to right sided vision loss. She was seen by Teleneurology with exam revealing an NIHSS of 5 and was determined to be a candidate for TNK, which was administered at [facility]. She was then transferred to [facility] for post-TNK management and stroke work up.   At its worst, her headache was 9/10 in intensity, centered at the left forehead. Currently with a 3/10 left frontal headache. Continues to have some mild LUE weakness. Also with mild left sided sensory numbness. All of her symptoms are significantly improved since receiving TNK at OSH. She states that her current headache symptoms were different from her prior migraines, which usually involve her right orbital and periorbital region with right sided vision loss. Prior complicated migraine was associated with right sided weakness and she feels that her left sided weakness is atypical.    ROS  Comprehensive ROS performed and pertinent positives documented in the HPI   Past History   Past Medical History:  Diagnosis Date   Ankylosing spondylitis (HCC)    Anxiety    Arthritis    back, hands, neck (09/18/2014)   Asthma    Atypical chest pain    Chronic back pain    whole spine   Chronic bronchitis (HCC)    get it close to q yr (09/18/2014)   Chronic pain    Complicated migraine    Depression    Environmental allergies    Erosion of suburethral  sling (HCC)    Fibromyalgia    Frequency of urination    GERD (gastroesophageal reflux disease)    Headache    at least several times/wk (09/18/2014)   Heart murmur    when I was a baby   Herpes simplex type 2 infection    History of angina    HLA B27 positive    Hypertension    Hypothyroidism    IBS (irritable bowel syndrome)    IC (interstitial cystitis)    Lesion of frontal lobe of brain    Migraine    maybe once/month (09/18/2014)   Nocturia    NSVD (normal spontaneous vaginal delivery)    x3   Osteoarthritis    Pneumonia    RLS (restless legs syndrome)    Sarcoid    Seizures (HCC)    when I was a teenager   Shortness of breath dyspnea    Exertion   SUI (stress urinary incontinence, female)    Thyroid  disease    Past Surgical History:  Procedure Laterality Date   ABDOMINAL HYSTERECTOMY  2006   ANTERIOR CERVICAL DECOMP/DISCECTOMY FUSION  2011   APPENDECTOMY  1985   ARTHROPLASTY     Left thumb   BACK SURGERY  2008   X 2 in 2010   BACK SURGERY     CESAREAN SECTION  2000   CRANIECTOMY FOR DEPRESSED SKULL FRACTURE Left 12/05/2014   Procedure: Left frontal stereotactic craniectomy for biopsy of skull lesion;  Surgeon: Gerldine Maizes,  MD;  Location: [facility];  Service: Neurosurgery;  Laterality: Left;  Left frontal stereotactic craniectomy for biopsy of skull lesion   CYSTO WITH HYDRODISTENSION N/A 11/30/2012   Procedure: CYSTOSCOPY/HYDRODISTENSION;  Surgeon: Alm GORMAN Fragmin, MD;  Location: [facility];  Service: Urology;  Laterality: N/A;   KNEE ARTHROSCOPY Right    LUMBAR DISC SURGERY  02-02-2007   RIGHT SIDE L5 -- S1   LUMBAR FUSION  12-04-2008   L4  -- S1   LYNX RETROPUBIC SUBURETHRAL SLING  03-02-2007   MASS EXCISION Left 03/19/2017   Procedure: EXCISION LEFT WRIST VOLAR MASS;  Surgeon: Sissy Cough, MD;  Location: [facility] OR;  Service: Orthopedics;  Laterality: Left;   MEDIASTINOSCOPY N/A 10/26/2014   Procedure: MEDIASTINOSCOPY;   Surgeon: Elspeth JAYSON Millers, MD;  Location: [facility] OR;  Service: Thoracic;  Laterality: N/A;   NECK SURGERY     PELVIC LAPAROSCOPY  1990's   LYSIS ADHESIONS   PUBOVAGINAL SLING N/A 11/30/2012   Procedure: CARLOYN GLADE;  Surgeon: Alm GORMAN Fragmin, MD;  Location: [facility];  Service: Urology;  Laterality: N/A;  1 HR EXAM UNDER ANESTHESIA, EXCISION OF SUB URETHRAL MESH, CYSTO, HOD    SPINAL CORD STIMULATOR IMPLANT     TONSILLECTOMY  1986   TUBAL LIGATION  2001   hulka clip   VIDEO BRONCHOSCOPY WITH ENDOBRONCHIAL ULTRASOUND N/A 09/21/2014   Procedure: VIDEO BRONCHOSCOPY WITH ENDOBRONCHIAL ULTRASOUND;  Surgeon: Lamar GORMAN Chris, MD;  Location: [facility] OR;  Service: Thoracic;  Laterality: N/A;   Family History  Problem Relation Age of Onset   Diabetes Mother    Hypertension Mother    Heart disease Mother    Cancer Father        LUNG AND BRAIN   Hypertension Father    Heart disease Maternal Grandmother    Cancer Maternal Grandfather        LUNG   Heart disease Paternal Grandmother    Cancer Paternal Grandfather    Social History   Socioeconomic History   Marital status: Married    Spouse name: Not on file   Number of children: Not on file   Years of education: Not on file   Highest education level: Not on file  Occupational History   Occupation: unemployed due to medical reasons  Tobacco Use   Smoking status: Former    Current packs/day: 0.00    Average packs/day: 0.5 packs/day for 15.0 years (7.5 ttl pk-yrs)    Types: Cigarettes    Start date: 01/16/1999    Quit date: 01/15/2014    Years since quitting: 10.1   Smokeless tobacco: Never  Vaping Use   Vaping status: Former  Substance and Sexual Activity   Alcohol use: Not Currently    Comment: few glasses a week   Drug use: Never    Comment: marijuana medicinal patches on her back - 03/18/17 states she no longer uses this   Sexual activity: Not Currently  Other Topics Concern   Not on file  Social History  Narrative   ** Merged History Encounter **       Social Drivers of Health   Financial Resource Strain: Low Risk  (10/25/2023)   Received from [facility]   Overall Financial Resource Strain (CARDIA)    Difficulty of Paying Living Expenses: Not hard at all  Food Insecurity: No Food Insecurity (12/07/2023)   Hunger Vital Sign    Worried About Running Out of Food in the Last Year: Never true  Ran Out of Food in the Last Year: Never true  Transportation Needs: No Transportation Needs (12/07/2023)   PRAPARE - Administrator, Civil Service (Medical): No    Lack of Transportation (Non-Medical): No  Physical Activity: Unknown (10/25/2023)   Received from [facility]   Exercise Vital Sign    On average, how many days per week do you engage in moderate to strenuous exercise (like a brisk walk)?: 0 days    Minutes of Exercise per Session: Not on file  Stress: No Stress Concern Present (10/25/2023)   Received from [facility] - Occupational Stress Questionnaire    Feeling of Stress : Not at all  Social Connections: Moderately Integrated (10/25/2023)   Received from [facility]   Social Network    How would you rate your social network (family, work, friends)?: Adequate participation with social networks   Allergies  Allergen Reactions   Lyrica [Pregabalin] Anaphylaxis   Topiramate  Other (See Comments)    Pt had hallucinations   Humira [Adalimumab] Other (See Comments)    Muscle weakness   Infliximab  Other (See Comments), Itching, Rash and Hives    Other reaction(s): infusion reaction, Other (See Comments)   Percocet [Oxycodone -Acetaminophen ] Itching   Sulfamethoxazole-Trimethoprim Other (See Comments)    Fever     Vicodin [Hydrocodone -Acetaminophen ] Itching    Medications   Current Facility-Administered Medications:    sodium chloride  flush (NS) 0.9 % injection 3 mL, 3 mL, Intravenous, Once, Kommor, [place], MD   Vitals    Vitals:   03/06/24 2230 03/06/24 2241 03/06/24 2249 03/06/24 2345  BP: (!) 146/90   (!) 182/102  Pulse: 94 94 97 (!) 112  Resp: (!) 25 16 18  (!) 21  Temp:      TempSrc:      SpO2: 95% 97% 96% 95%  Weight:      Height:         Body mass index is 32.7 kg/m.  Physical Exam   Constitutional: Appears well-developed and well-nourished.  Psych: Affect appropriate to situation.  Eyes: No scleral injection.  HENT: No OP obstruction.  Head: Normocephalic.  Respiratory: Effort normal, non-labored breathing.    Neurologic Examination  Ment: Awake and alert. Oriented x 5. Speech fluent with intact comprehension and naming. No dysarthria.  CN: Visual fields intact. PERRL. EOMI. Face symmetric. Temp sensation increased to left side of face. Phonation intact. Tongue protrudes midline.  Motor: 5/5 RUE and RLE 4+/5 LUE proximally and distally 5/5 LLE Sensory: Decreased FT to LUE. Temp sensation equal x 4. No extinction to DSS.  Reflexes: 1+ in upper and lower extremities bilaterally.  Cerebellar: No ataxia with FNF bilaterally Gait: Deferred  NIHSS: 1  Labs   CBC:  Recent Labs  Lab 03/06/24 2113  WBC 6.8  NEUTROABS 3.6  HGB 12.8  HCT 37.0  MCV 84.7  PLT 276   Basic Metabolic Panel:  Lab Results  Component Value Date   NA 137 03/06/2024   K 4.0 03/06/2024   CO2 22 03/06/2024   GLUCOSE 259 (H) 03/06/2024   BUN 14 03/06/2024   CREATININE 0.84 03/06/2024   CALCIUM  10.5 (H) 03/06/2024   GFRNONAA >60 03/06/2024   GFRAA >60 02/03/2020   Lipid Panel:  Lab Results  Component Value Date   LDLCALC 116 (H) 12/08/2023   HgbA1c:  Lab Results  Component Value Date   HGBA1C 10.0 (H) 12/08/2023   Urine Drug Screen:  Component Value Date/Time   LABOPIA NONE DETECTED 12/07/2023 1859   COCAINSCRNUR NONE DETECTED 12/07/2023 1859   LABBENZ NONE DETECTED 12/07/2023 1859   AMPHETMU NONE DETECTED 12/07/2023 1859   THCU NONE DETECTED 12/07/2023 1859   LABBARB NONE  DETECTED 12/07/2023 1859    Alcohol Level     Component Value Date/Time   ETH <15 03/06/2024 2113   INR  Lab Results  Component Value Date   INR 1.0 03/06/2024   APTT  Lab Results  Component Value Date   APTT 24 03/06/2024     Assessment  58 y.o. female with an extensive PMHx as documented below (reviewed), including complicated migraine, HTN, DM, seizures, fibromyalgia, spinal cord stimulator placement and chronic pain, who initially presented to [facility] with acute onset of headache with left sided numbness and weakness in addition to right sided vision loss. She was seen by Teleneurology with exam revealing an NIHSS of 5 and was determined to be a candidate for TNK, which was administered at [facility]. She was then transferred to [facility] for post-TNK management and stroke work up.  - Exam reveals left sided sensory deficit and 4+/5 LUE strength proximally and distally without drift. NIHSS 1.  - CT head: No evidence of acute abnormality. ASPECTS is 10. - CTA of head and neck: No emergent large vessel occlusion or proximal hemodynamically significant stenosis. - Calcium  elevated at 10.5. Glucose 259. BUN and Cr are normal. CBC normal.  - Impression: Acute onset of severe left frontal headache with associated symptoms of right sided vision loss and left sided numbness with weakness. DDx includes stroke and complicated migraine.    Recommendations  - Admitting to Neuro ICU.  - Post-TNK order set to include frequent neuro checks and BP management.  - No antiplatelet medications or anticoagulants for at least 24 hours following TNK.  - DVT prophylaxis with SCDs.  - May need to be started on a statin.  - Resume her home antiplatelet therapy regimen if follow up CT at 24 hours is negative for hemorrhagic conversion. - Continue synthroid  - TSH level - TTE.  - PT/OT/Speech.  - NPO until passes swallow evaluation.  - Sliding scale insulin .  - Telemetry monitoring -. Fasting lipid  panel, HgbA1c - Unable to obtain MRI due to spinal cord stimulator - Risk factor modification   ______________________________________________________________________   Bonney SHARK, Sabrina Mulkern, MD [facility]

## 2024-03-07 NOTE — Progress Notes (Signed)
 OT Cancellation Note  Patient Details Name: Elia Nunley MRN: [id number] DOB: 1966-06-22   Cancelled Treatment:    Reason Eval/Treat Not Completed: Active bedrest order.  Continue efforts as appropriate.    Juwan Vences D Birtha Hatler 03/07/2024, 8:36 AM 03/07/2024  RP, OTR/L  Acute Rehabilitation Services  Office:  [phone]

## 2024-03-07 NOTE — Evaluation (Signed)
 Physical Therapy Evaluation Patient Details Name: [name] MRN: [id number] DOB: March 01, 1966 Today's Date: 03/07/2024  History of Present Illness  58 yo female who presents to [facility] on 03/06/24 with c/o headaches and L sided weakness and numbness. Initial head CT was unremarkable. Pt was admitted and is s/p TNK administration. PMH including chronic back pain and cervical post laminectomy syndrome with spinal stimulator, hx of lumbar fusion, HTN, migraine, asthma, hypothyroidism.  Clinical Impression  Prior to admittance, pt was independent with mobility and all ADLs. Pt presents to evaluation with deficits in power, balance, and activity tolerance, all limiting pt's ability to mobilize near baseline. Pt was able to ambulate w/out an AD and no physical assistance. Pt had one instance of L ankle instability but no physical assistance required for maintaining upright. Pt would benefit from further gait, and stair training. PT will continue to treat pt while she is admitted. Pt does not feel she will need any follow up therapies at discharge; no follow-up PT recommended at this time.         If plan is discharge home, recommend the following: A little help with walking and/or transfers;A little help with bathing/dressing/bathroom;Assist for transportation;Help with stairs or ramp for entrance   Can travel by private vehicle        Equipment Recommendations None recommended by PT  Recommendations for Other Services       Functional Status Assessment Patient has had a recent decline in their functional status and demonstrates the ability to make significant improvements in function in a reasonable and predictable amount of time.     Precautions / Restrictions Precautions Precautions: Fall Recall of Precautions/Restrictions: Intact Restrictions Weight Bearing Restrictions Per Provider Order: No      Mobility  Bed Mobility Overal bed mobility: Needs Assistance Bed Mobility: Supine to  Sit, Sit to Supine     Supine to sit: Supervision, HOB elevated Sit to supine: Supervision, HOB elevated   General bed mobility comments: increased time to complete    Transfers Overall transfer level: Needs assistance Equipment used: None Transfers: Sit to/from Stand Sit to Stand: Supervision           General transfer comment: increased time to complete. pt pushes up from surface w/ BUE    Ambulation/Gait Ambulation/Gait assistance: Contact guard assist Gait Distance (Feet): 75 Feet Assistive device: None Gait Pattern/deviations: Step-through pattern, Decreased stride length Gait velocity: reduced Gait velocity interpretation: <1.8 ft/sec, indicate of risk for recurrent falls   General Gait Details: Pt ambulates w/ reciprocal gait pattern w/ one instance of instability but no physical assistance required for maintaining upright. Pt ambulates w/ reduced cadence and occasionally reaches for surface for stability in narrow spaces.  Stairs            Wheelchair Mobility     Tilt Bed    Modified Rankin (Stroke Patients Only) Modified Rankin (Stroke Patients Only) Pre-Morbid Rankin Score: No symptoms Modified Rankin: Slight disability     Balance Overall balance assessment: Needs assistance Sitting-balance support: No upper extremity supported, Feet supported Sitting balance-Leahy Scale: Good Sitting balance - Comments: seated EOB   Standing balance support: No upper extremity supported, During functional activity Standing balance-Leahy Scale: Fair Standing balance comment: able to shift weight throughout gait w/ one instnace of instability                             Pertinent Vitals/Pain Pain Assessment Pain Assessment:  No/denies pain    Home Living Family/patient expects to be discharged to:: Private residence Living Arrangements: Spouse/significant other Available Help at Discharge: Family;Available PRN/intermittently Type of Home:  House Home Access: Stairs to enter Entrance Stairs-Rails: Can reach both;Right;Left Entrance Stairs-Number of Steps: 3-4   Home Layout: One level Home Equipment: Grab bars - tub/shower;Grab bars - toilet;Shower seat - built Charity fundraiser (2 wheels);Wheelchair Financial trader (4 wheels)      Prior Function Prior Level of Function : Independent/Modified Independent;Driving             Mobility Comments: Ind no AD ADLs Comments: ind     Extremity/Trunk Assessment   Upper Extremity Assessment Upper Extremity Assessment: Overall WFL for tasks assessed    Lower Extremity Assessment Lower Extremity Assessment: Overall WFL for tasks assessed (5/5 MMT, sensation intact. per pt report she feels weaker than her baseline in her LEs)    Cervical / Trunk Assessment Cervical / Trunk Assessment: Normal  Communication   Communication Communication: No apparent difficulties    Cognition Arousal: Alert Behavior During Therapy: WFL for tasks assessed/performed   PT - Cognitive impairments: No apparent impairments                         Following commands: Intact       Cueing Cueing Techniques: Verbal cues     General Comments General comments (skin integrity, edema, etc.): no signs of acute distress    Exercises     Assessment/Plan    PT Assessment Patient needs continued PT services  PT Problem List Decreased activity tolerance;Decreased balance;Decreased mobility;Decreased strength       PT Treatment Interventions DME instruction;Gait training;Stair training;Functional mobility training;Therapeutic activities;Balance training;Therapeutic exercise;Neuromuscular re-education;Patient/family education;Manual techniques;Modalities    PT Goals (Current goals can be found in the Care Plan section)  Acute Rehab PT Goals Patient Stated Goal: to go home PT Goal Formulation: With patient Time For Goal Achievement: 03/21/24 Potential to Achieve Goals: Good     Frequency Min 3X/week     Co-evaluation               AM-PAC PT 6 Clicks Mobility  Outcome Measure Help needed turning from your back to your side while in a flat bed without using bedrails?: A Little Help needed moving from lying on your back to sitting on the side of a flat bed without using bedrails?: A Little Help needed moving to and from a bed to a chair (including a wheelchair)?: A Little Help needed standing up from a chair using your arms (e.g., wheelchair or bedside chair)?: A Little Help needed to walk in hospital room?: A Little Help needed climbing 3-5 steps with a railing? : A Lot 6 Click Score: 17    End of Session Equipment Utilized During Treatment: Gait belt Activity Tolerance: Patient tolerated treatment well Patient left: in bed;with call bell/phone within reach;with bed alarm set Nurse Communication: Mobility status PT Visit Diagnosis: Other abnormalities of gait and mobility (R26.89)    Time: 1028-1050 PT Time Calculation (min) (ACUTE ONLY): 22 min   Charges:   PT Evaluation $PT Eval Low Complexity: 1 Low   PT General Charges $$ ACUTE PT VISIT: 1 Visit         Leontine Hilt, SPT Acute Rehab [phone]   Leontine Hilt 03/07/2024, 11:09 AM

## 2024-03-07 NOTE — Evaluation (Signed)
 Speech Language Pathology Evaluation Patient Details Name: [name] MRN: [id number] DOB: [birth year] Today's Date: 03/07/2024 Time: 8867-8850 SLP Time Calculation (min) (ACUTE ONLY): 17 min  Problem List:  Patient Active Problem List   Diagnosis Date Noted   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   Stroke (HCC) 03/06/2024   Left leg weakness 02/02/2020   Complicated migraine 03/12/2017   Class 1 obesity due to excess calories with body mass index (BMI) of 32.0 to 32.9 in adult    Neurological symptoms 03/11/2017   Steroid-induced hyperglycemia 03/11/2017   Depression 03/11/2017   Essential hypertension 03/11/2017   Hypothyroidism 03/11/2017   Chronic pain 03/11/2017   Palpitations 11/04/2016   Near syncope 11/04/2016   Dyspnea 11/04/2016   HA (headache) 11/14/2014   Sarcoid 11/12/2014   Left-sided weakness    Left hemiparesis (HCC) 09/18/2014   Weakness 09/18/2014   Frontal skull lesion 09/18/2014   Atypical chest pain 09/18/2014   Extrinsic asthma 04/25/2013   Weight gain 01/12/2013   Erosion of graft 11/16/2012   Past Medical History:  Past Medical History:  Diagnosis Date   Ankylosing spondylitis (HCC)    Anxiety    Arthritis    back, hands, neck (09/18/2014)   Asthma    Atypical chest pain    Chronic back pain    whole spine   Chronic bronchitis (HCC)    get it close to q yr (09/18/2014)   Chronic pain    Complicated migraine    Depression    Environmental allergies    Erosion of suburethral sling (HCC)    Fibromyalgia    Frequency of urination    GERD (gastroesophageal reflux disease)    Headache    at least several times/wk (09/18/2014)   Heart murmur    when I was a baby   Herpes simplex type 2 infection    History of angina    HLA B27 positive    Hypertension    Hypothyroidism    IBS (irritable bowel syndrome)    IC (interstitial cystitis)    Lesion of frontal lobe of brain    Migraine    maybe once/month (09/18/2014)    Nocturia    NSVD (normal spontaneous vaginal delivery)    x3   Osteoarthritis    Pneumonia    RLS (restless legs syndrome)    Sarcoid    Seizures (HCC)    when I was a teenager   Shortness of breath dyspnea    Exertion   SUI (stress urinary incontinence, female)    Thyroid  disease    Past Surgical History:  Past Surgical History:  Procedure Laterality Date   ABDOMINAL HYSTERECTOMY  2006   ANTERIOR CERVICAL DECOMP/DISCECTOMY FUSION  2011   APPENDECTOMY  1985   ARTHROPLASTY     Left thumb   BACK SURGERY  2008   X 2 in 2010   BACK SURGERY     CESAREAN SECTION  2000   CRANIECTOMY FOR DEPRESSED SKULL FRACTURE Left 12/05/2014   Procedure: Left frontal stereotactic craniectomy for biopsy of skull lesion;  Surgeon: Gerldine Maizes, MD;  Location: [facility];  Service: Neurosurgery;  Laterality: Left;  Left frontal stereotactic craniectomy for biopsy of skull lesion   CYSTO WITH HYDRODISTENSION N/A 11/30/2012   Procedure: CYSTOSCOPY/HYDRODISTENSION;  Surgeon: Alm GORMAN Fragmin, MD;  Location: [facility];  Service: Urology;  Laterality: N/A;   KNEE ARTHROSCOPY Right    LUMBAR DISC SURGERY  02-02-2007   RIGHT SIDE L5 -- S1  LUMBAR FUSION  12-04-2008   L4  -- S1   LYNX RETROPUBIC SUBURETHRAL SLING  03-02-2007   MASS EXCISION Left 03/19/2017   Procedure: EXCISION LEFT WRIST VOLAR MASS;  Surgeon: Sissy Cough, MD;  Location: [facility];  Service: Orthopedics;  Laterality: Left;   MEDIASTINOSCOPY N/A 10/26/2014   Procedure: MEDIASTINOSCOPY;  Surgeon: Elspeth JAYSON Millers, MD;  Location: [facility] OR;  Service: Thoracic;  Laterality: N/A;   NECK SURGERY     PELVIC LAPAROSCOPY  1990's   LYSIS ADHESIONS   PUBOVAGINAL SLING N/A 11/30/2012   Procedure: CARLOYN GLADE;  Surgeon: Alm GORMAN Fragmin, MD;  Location: [facility];  Service: Urology;  Laterality: N/A;  1 HR EXAM UNDER ANESTHESIA, EXCISION OF SUB URETHRAL MESH, CYSTO, HOD    SPINAL CORD STIMULATOR IMPLANT      TONSILLECTOMY  1986   TUBAL LIGATION  2001   hulka clip   VIDEO BRONCHOSCOPY WITH ENDOBRONCHIAL ULTRASOUND N/A 09/21/2014   Procedure: VIDEO BRONCHOSCOPY WITH ENDOBRONCHIAL ULTRASOUND;  Surgeon: Lamar GORMAN Chris, MD;  Location: [facility] OR;  Service: Thoracic;  Laterality: N/A;   HPI:  58 yo female who presents to [facility] on 03/06/24 with c/o headaches and L sided weakness and numbness. Initial head CT was unremarkable. Pt was admitted and is s/p TNK administration. PMH including chronic back pain and cervical post laminectomy syndrome with spinal stimulator, hx of lumbar fusion, HTN, migraine, asthma, hypothyroidism.   Assessment / Plan / Recommendation Clinical Impression  Pt is independent at baseline, not currently employed (on disability) who denies prior or current difficulty with speech-language-cognition. No appreciable oromotor weakness. She complained of a headache but willing to participate. Speech is intelligible and language abilities are normal. Pt was given the SLUMS which she scored a 27/30 which is considered within normal range. Pt received a phone call and was able to give caller detailed information of where to locate item in her house. Pt agrees with no further ST needed at this time.    SLP Assessment  SLP Recommendation/Assessment: Patient does not need any further Speech Language Pathology Services SLP Visit Diagnosis: Cognitive communication deficit (R41.841)     Assistance Recommended at Discharge     Functional Status Assessment Patient has not had a recent decline in their functional status  Frequency and Duration           SLP Evaluation Cognition  Overall Cognitive Status: Within Functional Limits for tasks assessed Arousal/Alertness: Awake/alert Orientation Level: Oriented X4 Year: 2025 Day of Week: Correct Attention: Sustained Sustained Attention: Appears intact Memory: Appears intact Awareness: Appears intact Problem Solving: Appears intact Safety/Judgment:  Appears intact       Comprehension  Auditory Comprehension Overall Auditory Comprehension: Appears within functional limits for tasks assessed Visual Recognition/Discrimination Discrimination: Not tested Reading Comprehension Reading Status: Not tested    Expression Expression Primary Mode of Expression: Verbal Verbal Expression Overall Verbal Expression: Appears within functional limits for tasks assessed Initiation: No impairment Level of Generative/Spontaneous Verbalization: Conversation Repetition:  (NT) Naming: No impairment Pragmatics: No impairment Written Expression Dominant Hand: Right Written Expression: Not tested   Oral / Motor  Oral Motor/Sensory Function Overall Oral Motor/Sensory Function: Within functional limits Motor Speech Overall Motor Speech: Appears within functional limits for tasks assessed Respiration: Within functional limits Phonation: Normal Resonance: Within functional limits Articulation: Within functional limitis Intelligibility: Intelligible Motor Planning: Within functional limits Motor Speech Errors: Not applicable            Dustin Olam Bull 03/07/2024, 12:11 PM

## 2024-03-07 NOTE — Progress Notes (Addendum)
 STROKE TEAM PROGRESS NOTE    INTERIM HISTORY/SUBJECTIVE  No family at the bedside.  Patient is laying in the bed in no apparent distress.  She states she feels back to baseline today and does not feel like this was one of her typical migraines.  She does complain of a slight headache in the back of her head this morning  She will have 24 hours CT scan tonight around 2100 Will also have diabetes management see patient due to elevated A1c   CBC    Component Value Date/Time   WBC 6.8 03/06/2024 2113   RBC 4.37 03/06/2024 2113   HGB 12.8 03/06/2024 2113   HCT 37.0 03/06/2024 2113   HCT 36.0 09/19/2014 1455   PLT 276 03/06/2024 2113   MCV 84.7 03/06/2024 2113   MCH 29.3 03/06/2024 2113   MCHC 34.6 03/06/2024 2113   RDW 13.6 03/06/2024 2113   LYMPHSABS 2.2 03/06/2024 2113   MONOABS 0.7 03/06/2024 2113   EOSABS 0.2 03/06/2024 2113   BASOSABS 0.0 03/06/2024 2113    BMET    Component Value Date/Time   NA 137 03/06/2024 2113   K 4.0 03/06/2024 2113   CL 103 03/06/2024 2113   CO2 22 03/06/2024 2113   GLUCOSE 259 (H) 03/06/2024 2113   BUN 14 03/06/2024 2113   CREATININE 0.84 03/06/2024 2113   CALCIUM  10.5 (H) 03/06/2024 2113   GFRNONAA >60 03/06/2024 2113    IMAGING past 24 hours CT ANGIO HEAD NECK W WO CM (CODE STROKE) Result Date: 03/06/2024 CLINICAL DATA:  Neuro deficit, acute, stroke suspected EXAM: CT ANGIOGRAPHY HEAD AND NECK WITH AND WITHOUT CONTRAST TECHNIQUE: Multidetector CT imaging of the head and neck was performed using the standard protocol during bolus administration of intravenous contrast. Multiplanar CT image reconstructions and MIPs were obtained to evaluate the vascular anatomy. Carotid stenosis measurements (when applicable) are obtained utilizing NASCET criteria, using the distal internal carotid diameter as the denominator. RADIATION DOSE REDUCTION: This exam was performed according to the departmental dose-optimization program which includes automated exposure  control, adjustment of the mA and/or kV according to patient size and/or use of iterative reconstruction technique. CONTRAST:  75mL OMNIPAQUE  IOHEXOL  350 MG/ML SOLN COMPARISON:  Same day CT head. FINDINGS: CTA NECK FINDINGS Aortic arch: Great vessel origins are patent. Right carotid system: No evidence of dissection, stenosis (50% or greater), or occlusion. Left carotid system: No evidence of dissection, stenosis (50% or greater), or occlusion. Vertebral arteries: Left dominant. No evidence of dissection, stenosis (50% or greater), or occlusion. Skeleton: No acute abnormality on limited assessment. Other neck: No acute abnormality on limited assessment. Upper chest: Visualized lung apices are clear. Review of the MIP images confirms the above findings CTA HEAD FINDINGS Anterior circulation: Bilateral intracranial ICAs, if Ca is coma and ACAs are patent without proximal hemodynamically significant stenosis. Posterior circulation: Bilateral intradural vertebral arteries, basilar artery and bilateral posterior cerebral arteries are patent without proximal hemodynamically significant stenosis. Venous sinuses: As permitted by contrast timing, patent. Review of the MIP images confirms the above findings IMPRESSION: No emergent large vessel occlusion or proximal hemodynamically significant stenosis. Electronically Signed   By: Gilmore GORMAN Molt M.D.   On: 03/06/2024 22:39   CT HEAD CODE STROKE WO CONTRAST Result Date: 03/06/2024 CLINICAL DATA:  Code stroke.  Neuro deficit, acute, stroke suspected EXAM: CT HEAD WITHOUT CONTRAST TECHNIQUE: Contiguous axial images were obtained from the base of the skull through the vertex without intravenous contrast. RADIATION DOSE REDUCTION: This exam was performed  according to the departmental dose-optimization program which includes automated exposure control, adjustment of the mA and/or kV according to patient size and/or use of iterative reconstruction technique. COMPARISON:  None  Available. FINDINGS: Brain: No evidence of acute vascular territory infarction, hemorrhage, hydrocephalus, extra-axial collection or mass lesion/mass effect. Similar patchy white matter hypodensities compatible with chronic microvascular ischemic change. Vascular: Calcific atherosclerosis.  No hyperdense vessel. Skull: Normal. Negative for fracture or focal lesion. Sinuses/Orbits: No acute finding. ASPECTS [facility] Stroke Program Early CT Score) Total score (0-10 with 10 being normal): IMPRESSION: 1. No evidence of acute abnormality. 2. ASPECTS is 10. Code stroke imaging results were communicated on 03/06/2024 at 8:54 pm to provider Dr. Albertina via telephone. Electronically Signed   By: Gilmore GORMAN Molt M.D.   On: 03/06/2024 20:54    Vitals:   03/07/24 1154 03/07/24 1200 03/07/24 1300 03/07/24 1400  BP:  (!) 146/87 131/69 (!) 155/83  Pulse:  82 77 80  Resp:  19 14 16   Temp: 98.4 F (36.9 C)     TempSrc: Oral     SpO2:  94% 94% 94%  Weight:      Height:         PHYSICAL EXAM General:  Alert, well-nourished, well-developed patient in no acute distress Psych:  Mood and affect appropriate for situation CV: Regular rate and rhythm on monitor Respiratory:  Regular, unlabored respirations on room air GI: Abdomen soft and nontender   NEURO:  Mental Status: AA&Ox3, patient is able to give clear and coherent history Speech/Language: speech is without dysarthria or aphasia.  Naming, repetition, fluency, and comprehension intact.  Cranial Nerves:  II: PERRL. Visual fields full.  III, IV, VI: EOMI. Eyelids elevate symmetrically.  V: Sensation is intact to light touch and symmetrical to face.  VII: Face is symmetrical resting and smiling VIII: hearing intact to voice. IX, X: Palate elevates symmetrically. Phonation is normal.  KP:Dynloizm shrug 5/5. XII: tongue is midline without fasciculations. Motor: 5/5 strength to all muscle groups tested.  Tone: is normal and bulk is normal Sensation-  Intact to light touch bilaterally. Extinction absent to light touch to DSS.   Coordination: FTN intact bilaterally, HKS: no ataxia in BLE.No drift.  Gait- deferred  Most Recent NIH 0    ASSESSMENT/PLAN  Ms. Ryker Pherigo is a 58 y.o. female with history of complicated migraine, HTN, DM, seizures, fibromyalgia, spinal cord stimulator placement and chronic pain, who initially presented to [facility] with acute onset of headache with left sided numbness and weakness in addition to right sided vision loss. She was seen by Teleneurology with exam revealing an NIHSS of 5 and was determined to be a candidate for TNK, which was administered at [facility]. She was then transferred to [facility] for post-TNK management and stroke work up.   Stroke like symptoms s/p TNK, could be complicated migraine versus functional neurologic disorder Code Stroke CT head No acute abnormality. ASPECTS 10.    CTA head & neck no LVO MRI able to obtain due to spinal stimulator not MRI compatible 24 hours post TNK CT head pending  2D Echo 11/2023 EF 60 to 65% LDL 90 HgbA1c 10.4 UDS negative VTE prophylaxis - SCDs aspirin  81 mg daily prior to admission, now on No antithrombotic for 24 hours post TNK and 24-hour brain imaging negative for hemorrhage.  If negative for hemorrhage will restart home aspirin  Therapy recommendations:  None Disposition: Pending  Hx of stroke like symptoms 08/2014 admitted for left-sided weakness, neglect and hemianopia.  MRI negative, considered functional neurological disorder 10/2014 presented with headache.  MRI no infarct.  Found to have left frontal bone enhancing lesion, status post left frontal craniectomy for biopsy followed by cranioplasty 12/2014 presented for left face and arm tingling, left hemianopia.  MRI no acute infarct.  CTA head and neck unremarkable. 02/2017 admitted for left-sided weakness.  MRI negative.  Had LP and CSF negative for CNS infection.  Diagnosed with  complicated migraine 11/2023 admitted for headache, left-sided weakness and numbness.  CT no acute abnormality.  Not able to have MRI due to spinal cord stimulator.  Exam consistent with functional component.  EF 60 to 65%, LDL 116, A1c 10.0.  Migraines Remote history of seizures Follows with neurology at [facility] Not on prophylactic migraine meds Seizures as a teenager on no medications  Hypertension Home meds: HCTZ 12.5, losartan 100 mg Stable Blood Pressure Goal: BP less than 180/105  Long term BP goal normotensive  HLD Home meds: crestor  10 LDL 90, goal < 100 Now on crestor  10 Continue at discharge  Diabetes type II UnControlled Home meds: Semaglutide , insulin  HgbA1c 10.4, goal < 7.0 CBGs SSI Diabetes management consulted Recommend close follow-up with PCP for better DM control  Other Stroke Risk Factors ETOH use, alcohol level <15, advised to drink no more than 1 drink(s) a day Obesity, Body mass index is 32.7 kg/m., BMI >/= 30 associated with increased stroke risk, recommend weight loss, diet and exercise as appropriate   Other Active Problems Anxiety and depression GERD Hypothyroidism Restless leg Fibromyalgia Asthma Chronic back pain s/p spinal cord stimulator in 2019  Hospital day # 1   Sabrina Geralds DNP, ACNPC-AG  [facility]  ATTENDING NOTE: I reviewed above note and agree with the assessment and plan. Pt was seen and examined.   RN at the bedside. Pt lying in bed, stated that she still has 4/10 headache but denies any weakness numbness or vision changes. No more nausea. Pending repeat CT tonight. She had multiple similar presentation in the past as above. No clear stroke identified. Hx of migraine and complicated migraine, follows [facility]   For detailed assessment and plan, please refer to above as I have made changes wherever appropriate.   Sabrina Cummins, MD PhD Stroke Neurology 03/07/2024 8:42 PM  This patient is critically ill due to  strokelike symptoms status post TNK and at significant risk of neurological worsening, death form bleeding from TNK. This patient's care requires constant monitoring of vital signs, hemodynamics, respiratory and cardiac monitoring, review of multiple databases, neurological assessment, discussion with family, other specialists and medical decision making of high complexity. I spent 35 minutes of neurocritical care time in the care of this patient.    To contact Stroke Continuity provider, please refer to [email or web address]. After hours, contact General Neurology

## 2024-03-07 NOTE — Progress Notes (Signed)
 PT Cancellation Note  Patient Details Name: [name] MRN: [id number] DOB: 01-Jan-1966   Cancelled Treatment:    Reason Eval/Treat Not Completed: Active bedrest order (Until discontinued, Starting on Tue 03/07/24 at 0021, Until Specified. Will follow up when appropriate.)  Leontine Hilt, SPT Acute Rehab [phone]    Leontine Hilt 03/07/2024, 7:33 AM

## 2024-03-07 NOTE — ED Provider Notes (Signed)
 [facility] NEURO/TRAUMA/SURGICAL ICU Provider Note  CSN: [id number] Arrival date & time: 03/06/24 2015  Chief Complaint(s) Dizziness and Headache  HPI [name] is a 58 y.o. female with PMH ankylosing spondylitis, asthma, chronic spinal pain, chronic migraines, GERD, previous hospital mission in April 2025 for TIA who presents emergency room for evaluation of headache, numbness, weakness.  States last known well was 1929.  States that this feels different than her normal migraines but she is having a severe headache.  Denies chest pain, shortness of breath, fever, vomiting or other systemic symptoms.   Past Medical History Past Medical History:  Diagnosis Date   Ankylosing spondylitis (HCC)    Anxiety    Arthritis    back, hands, neck (09/18/2014)   Asthma    Atypical chest pain    Chronic back pain    whole spine   Chronic bronchitis (HCC)    get it close to q yr (09/18/2014)   Chronic pain    Complicated migraine    Depression    Environmental allergies    Erosion of suburethral sling (HCC)    Fibromyalgia    Frequency of urination    GERD (gastroesophageal reflux disease)    Headache    at least several times/wk (09/18/2014)   Heart murmur    when I was a baby   Herpes simplex type 2 infection    History of angina    HLA B27 positive    Hypertension    Hypothyroidism    IBS (irritable bowel syndrome)    IC (interstitial cystitis)    Lesion of frontal lobe of brain    Migraine    maybe once/month (09/18/2014)   Nocturia    NSVD (normal spontaneous vaginal delivery)    x3   Osteoarthritis    Pneumonia    RLS (restless legs syndrome)    Sarcoid    Seizures (HCC)    when I was a teenager   Shortness of breath dyspnea    Exertion   SUI (stress urinary incontinence, female)    Thyroid  disease    Patient Active Problem List   Diagnosis Date Noted   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   Stroke (HCC) 03/06/2024   Left leg weakness  02/02/2020   Complicated migraine 03/12/2017   Class 1 obesity due to excess calories with body mass index (BMI) of 32.0 to 32.9 in adult    Neurological symptoms 03/11/2017   Steroid-induced hyperglycemia 03/11/2017   Depression 03/11/2017   Essential hypertension 03/11/2017   Hypothyroidism 03/11/2017   Chronic pain 03/11/2017   Palpitations 11/04/2016   Near syncope 11/04/2016   Dyspnea 11/04/2016   HA (headache) 11/14/2014   Sarcoid 11/12/2014   Left-sided weakness    Left hemiparesis (HCC) 09/18/2014   Weakness 09/18/2014   Frontal skull lesion 09/18/2014   Atypical chest pain 09/18/2014   Extrinsic asthma 04/25/2013   Weight gain 01/12/2013   Erosion of graft 11/16/2012   Home Medication(s) Prior to Admission medications   Medication Sig Start Date End Date Taking? Authorizing Provider  albuterol  (VENTOLIN  HFA) 108 (90 Base) MCG/ACT inhaler Inhale 2 puffs into the lungs every 6 (six) hours as needed for wheezing or shortness of breath. 12/29/21   Sood, Vineet, MD  aspirin  EC 81 MG tablet Take 1 tablet (81 mg total) by mouth daily. Swallow whole. 12/10/23   Pokhrel, Laxman, MD  butalbital -acetaminophen -caffeine  (FIORICET ) 50-325-40 MG tablet Take 1 tablet by mouth every 6 (six) hours as needed for headache  or migraine. 12/09/23   Pokhrel, Laxman, MD  celecoxib (CELEBREX) 200 MG capsule Take 200 mg by mouth 2 (two) times daily as needed for mild pain.  10/24/19   [provider]  Cholecalciferol (VITAMIN D3) 1.25 MG (50000 UT) CAPS Take 50,000 Units by mouth once a week.    [provider]  Continuous Glucose Receiver (DEXCOM G7 RECEIVER) DEVI 1 Device by Does not apply route continuous. Change sensor every 10 days 02/28/24   Thapa, [place], MD  Continuous Glucose Sensor (DEXCOM G7 SENSOR) MISC Use to check glucose continuously, change sensor every 10 days 02/28/24   Thapa, [place], MD  fluticasone  furoate-vilanterol (BREO ELLIPTA ) 100-25 MCG/ACT AEPB Inhale 1 puff into  the lungs daily. 12/29/21   Sood, Vineet, MD  gabapentin  (NEURONTIN ) 100 MG capsule Take 100 mg by mouth 4 (four) times daily as needed (for pain).    [provider]  HYDROmorphone  HCl (EXALGO ) 8 MG TB24 Take 8 mg by mouth daily. Takes two tablets to equal 16mg  Patient not taking: Reported on 01/03/2024    [provider]  insulin  aspart (NOVOLOG  FLEXPEN) 100 UNIT/ML FlexPen Take 15 units plus sliding scale with meals 3 times a day, maximum 60 units/day. 01/03/24   Thapa, [place], MD  insulin  glargine, 2 Unit Dial , (TOUJEO  MAX SOLOSTAR) 300 UNIT/ML Solostar Pen Inject 30 Units into the skin daily. 01/03/24   Thapa, [place], MD  levothyroxine  (SYNTHROID , LEVOTHROID) 137 MCG tablet Take 137 mcg by mouth daily before breakfast.    [provider]  lipase/protease/amylase (CREON) 12000-38000 units CPEP capsule Take 12,000 Units by mouth See admin instructions. Take two capsules by mouth  with each meal and 1 capsule by mouth with a snack per patient    [provider]  losartan (COZAAR) 100 MG tablet Take 100 mg by mouth.    [provider]  naloxone (NARCAN) 2 MG/2ML injection Inject 0.4 mg into the skin daily as needed (for overdose). Patient not taking: Reported on 01/03/2024 09/05/18   [provider]  omeprazole (PRILOSEC) 40 MG capsule Take 40 mg by mouth 2 (two) times daily.    [provider]  ondansetron  (ZOFRAN ) 4 MG tablet Take 1 tablet (4 mg total) by mouth every 8 (eight) hours as needed for nausea or vomiting. 01/29/17   Trudy Earnie CROME, CNM  PARoxetine  (PAXIL ) 40 MG tablet Take 40 mg by mouth at bedtime. 11/29/15   [provider]  promethazine  (PHENERGAN ) 25 MG tablet Take 1 tablet by mouth every 12 (twelve) hours as needed for nausea/vomiting. 01/18/20   [provider]  rOPINIRole  (REQUIP ) 2 MG tablet Take 4 mg by mouth at bedtime.    [provider]  rosuvastatin  (CRESTOR ) 10 MG tablet Take 10 mg by mouth daily.     [provider]  Semaglutide ,0.25 or 0.5MG /DOS, 2 MG/3ML SOPN Inject 0.5 mg into the skin once a week. 11/11/23   Thapa, [place], MD  traMADol  (ULTRAM ) 50 MG tablet Take 1 tablet (50 mg total) by mouth every 6 (six) hours as needed. Patient not taking: Reported on 01/03/2024 11/03/22   Tegeler, Lonni PARAS, MD  valACYclovir  (VALTREX ) 500 MG tablet Take 500 mg by mouth daily.     [provider]  vitamin B-12 (CYANOCOBALAMIN ) 100 MCG tablet Take 100 mcg by mouth daily.    [provider]  Past Surgical History Past Surgical History:  Procedure Laterality Date   ABDOMINAL HYSTERECTOMY  2006   ANTERIOR CERVICAL DECOMP/DISCECTOMY FUSION  2011   APPENDECTOMY  1985   ARTHROPLASTY     Left thumb   BACK SURGERY  2008   X 2 in 2010   BACK SURGERY     CESAREAN SECTION  2000   CRANIECTOMY FOR DEPRESSED SKULL FRACTURE Left 12/05/2014   Procedure: Left frontal stereotactic craniectomy for biopsy of skull lesion;  Surgeon: Gerldine Maizes, MD;  Location: [facility] ORS;  Service: Neurosurgery;  Laterality: Left;  Left frontal stereotactic craniectomy for biopsy of skull lesion   CYSTO WITH HYDRODISTENSION N/A 11/30/2012   Procedure: CYSTOSCOPY/HYDRODISTENSION;  Surgeon: Alm GORMAN Fragmin, MD;  Location: [facility];  Service: Urology;  Laterality: N/A;   KNEE ARTHROSCOPY Right    LUMBAR DISC SURGERY  02-02-2007   RIGHT SIDE L5 -- S1   LUMBAR FUSION  12-04-2008   L4  -- S1   LYNX RETROPUBIC SUBURETHRAL SLING  03-02-2007   MASS EXCISION Left 03/19/2017   Procedure: EXCISION LEFT WRIST VOLAR MASS;  Surgeon: Sissy Cough, MD;  Location: [facility] OR;  Service: Orthopedics;  Laterality: Left;   MEDIASTINOSCOPY N/A 10/26/2014   Procedure: MEDIASTINOSCOPY;  Surgeon: Elspeth JAYSON Millers, MD;  [facility];  Service: Thoracic;  Laterality: N/A;    NECK SURGERY     PELVIC LAPAROSCOPY  1990's   LYSIS ADHESIONS   PUBOVAGINAL SLING N/A 11/30/2012   Procedure: CARLOYN GLADE;  Surgeon: Alm GORMAN Fragmin, MD;  Location: [facility];  Service: Urology;  Laterality: N/A;  1 HR EXAM UNDER ANESTHESIA, EXCISION OF SUB URETHRAL MESH, CYSTO, HOD    SPINAL CORD STIMULATOR IMPLANT     TONSILLECTOMY  1986   TUBAL LIGATION  2001   hulka clip   VIDEO BRONCHOSCOPY WITH ENDOBRONCHIAL ULTRASOUND N/A 09/21/2014   Procedure: VIDEO BRONCHOSCOPY WITH ENDOBRONCHIAL ULTRASOUND;  Surgeon: Lamar GORMAN Chris, MD;  Location: [facility] OR;  Service: Thoracic;  Laterality: N/A;   Family History Family History  Problem Relation Age of Onset   Diabetes Mother    Hypertension Mother    Heart disease Mother    Cancer Father        LUNG AND BRAIN   Hypertension Father    Heart disease Maternal Grandmother    Cancer Maternal Grandfather        LUNG   Heart disease Paternal Grandmother    Cancer Paternal Grandfather     Social History Social History   Tobacco Use   Smoking status: Former    Current packs/day: 0.00    Average packs/day: 0.5 packs/day for 15.0 years (7.5 ttl pk-yrs)    Types: Cigarettes    Start date: 01/16/1999    Quit date: 01/15/2014    Years since quitting: 10.1   Smokeless tobacco: Never  Vaping Use   Vaping status: Former  Substance Use Topics   Alcohol use: Not Currently    Comment: few glasses a week   Drug use: Never    Comment: marijuana medicinal patches on her back - 03/18/17 states she no longer uses this   Allergies Lyrica [pregabalin], Topiramate , Humira [adalimumab], Infliximab , Percocet [oxycodone -acetaminophen ], Sulfamethoxazole-trimethoprim, and Vicodin [hydrocodone -acetaminophen ]  Review of Systems Review of Systems  Neurological:  Positive for weakness, numbness and headaches.    Physical Exam Vital Signs  I have reviewed the triage vital signs BP (!) 169/93   Pulse 95   Temp 98.2 F (36.8  C) (Oral)    Resp 18   Ht 5' 6 (1.676 m)   Wt 91.9 kg   LMP 06/03/2012   SpO2 94%   BMI 32.70 kg/m   Physical Exam Vitals and nursing note reviewed.  Constitutional:      General: She is not in acute distress.    Appearance: She is well-developed.  HENT:     Head: Normocephalic and atraumatic.  Eyes:     Conjunctiva/sclera: Conjunctivae normal.  Cardiovascular:     Rate and Rhythm: Normal rate and regular rhythm.     Heart sounds: No murmur heard. Pulmonary:     Effort: Pulmonary effort is normal. No respiratory distress.     Breath sounds: Normal breath sounds.  Abdominal:     Palpations: Abdomen is soft.     Tenderness: There is no abdominal tenderness.  Musculoskeletal:        General: No swelling.     Cervical back: Neck supple.  Skin:    General: Skin is warm and dry.     Capillary Refill: Capillary refill takes less than 2 seconds.  Neurological:     Mental Status: She is alert.     Cranial Nerves: No cranial nerve deficit.     Sensory: Sensory deficit present.     Motor: Weakness present.  Psychiatric:        Mood and Affect: Mood normal.     ED Results and Treatments Labs (all labs ordered are listed, but only abnormal results are displayed) Labs Reviewed  COMPREHENSIVE METABOLIC PANEL WITH GFR - Abnormal; Notable for the following components:      Result Value   Glucose, Bld 259 (*)    Calcium  10.5 (*)    Alkaline Phosphatase 133 (*)    All other components within normal limits  CBG MONITORING, ED - Abnormal; Notable for the following components:   Glucose-Capillary 281 (*)    All other components within normal limits  MRSA NEXT GEN BY PCR, NASAL  PROTIME-INR  APTT  CBC  DIFFERENTIAL  ETHANOL  LIPID PANEL                                                                                                                          Radiology CT ANGIO HEAD NECK W WO CM (CODE STROKE) Result Date: 03/06/2024 CLINICAL DATA:  Neuro deficit, acute, stroke suspected  EXAM: CT ANGIOGRAPHY HEAD AND NECK WITH AND WITHOUT CONTRAST TECHNIQUE: Multidetector CT imaging of the head and neck was performed using the standard protocol during bolus administration of intravenous contrast. Multiplanar CT image reconstructions and MIPs were obtained to evaluate the vascular anatomy. Carotid stenosis measurements (when applicable) are obtained utilizing NASCET criteria, using the distal internal carotid diameter as the denominator. RADIATION DOSE REDUCTION: This exam was performed according to the departmental dose-optimization program which includes automated exposure control, adjustment of the mA and/or kV according to patient size and/or use of iterative reconstruction technique. CONTRAST:  75mL OMNIPAQUE  IOHEXOL   350 MG/ML SOLN COMPARISON:  Same day CT head. FINDINGS: CTA NECK FINDINGS Aortic arch: Great vessel origins are patent. Right carotid system: No evidence of dissection, stenosis (50% or greater), or occlusion. Left carotid system: No evidence of dissection, stenosis (50% or greater), or occlusion. Vertebral arteries: Left dominant. No evidence of dissection, stenosis (50% or greater), or occlusion. Skeleton: No acute abnormality on limited assessment. Other neck: No acute abnormality on limited assessment. Upper chest: Visualized lung apices are clear. Review of the MIP images confirms the above findings CTA HEAD FINDINGS Anterior circulation: Bilateral intracranial ICAs, if Ca is coma and ACAs are patent without proximal hemodynamically significant stenosis. Posterior circulation: Bilateral intradural vertebral arteries, basilar artery and bilateral posterior cerebral arteries are patent without proximal hemodynamically significant stenosis. Venous sinuses: As permitted by contrast timing, patent. Review of the MIP images confirms the above findings IMPRESSION: No emergent large vessel occlusion or proximal hemodynamically significant stenosis. Electronically Signed   By: Gilmore GORMAN Molt M.D.   On: 03/06/2024 22:39   CT HEAD CODE STROKE WO CONTRAST Result Date: 03/06/2024 CLINICAL DATA:  Code stroke.  Neuro deficit, acute, stroke suspected EXAM: CT HEAD WITHOUT CONTRAST TECHNIQUE: Contiguous axial images were obtained from the base of the skull through the vertex without intravenous contrast. RADIATION DOSE REDUCTION: This exam was performed according to the departmental dose-optimization program which includes automated exposure control, adjustment of the mA and/or kV according to patient size and/or use of iterative reconstruction technique. COMPARISON:  None Available. FINDINGS: Brain: No evidence of acute vascular territory infarction, hemorrhage, hydrocephalus, extra-axial collection or mass lesion/mass effect. Similar patchy white matter hypodensities compatible with chronic microvascular ischemic change. Vascular: Calcific atherosclerosis.  No hyperdense vessel. Skull: Normal. Negative for fracture or focal lesion. Sinuses/Orbits: No acute finding. ASPECTS [facility] Stroke Program Early CT Score) Total score (0-10 with 10 being normal): IMPRESSION: 1. No evidence of acute abnormality. 2. ASPECTS is 10. Code stroke imaging results were communicated on 03/06/2024 at 8:54 pm to provider Dr. Albertina via telephone. Electronically Signed   By: Gilmore GORMAN Molt M.D.   On: 03/06/2024 20:54    Pertinent labs & imaging results that were available during my care of the patient were reviewed by me and considered in my medical decision making (see MDM for details).  Medications Ordered in ED Medications  sodium chloride  flush (NS) 0.9 % injection 3 mL (3 mLs Intravenous Not Given 03/07/24 0051)   stroke: early stages of recovery book (has no administration in time range)  0.9 %  sodium chloride  infusion ( Intravenous New Bag/Given 03/07/24 0144)  senna-docusate (Senokot-S) tablet 1 tablet (has no administration in time range)  pantoprazole  (PROTONIX ) injection 40 mg (40 mg Intravenous Given  03/07/24 0144)  clevidipine  (CLEVIPREX ) infusion 0.5 mg/mL (has no administration in time range)  rOPINIRole  (REQUIP ) tablet 4 mg (4 mg Oral Given 03/07/24 0139)  Oral care mouth rinse (has no administration in time range)  Chlorhexidine  Gluconate Cloth 2 % PADS 6 each (6 each Topical Given 03/07/24 0049)  prochlorperazine  (COMPAZINE ) injection 10 mg (10 mg Intravenous Given 03/06/24 2114)  diphenhydrAMINE  (BENADRYL ) injection 25 mg (25 mg Intravenous Given 03/06/24 2113)  lactated ringers  bolus 1,000 mL (0 mLs Intravenous Stopped 03/06/24 2240)  tenecteplase  (TNKASE ) injection for Stroke 23 mg (23 mg Intravenous Given 03/06/24 2119)  iohexol  (OMNIPAQUE ) 350 MG/ML injection 75 mL (75 mLs Intravenous Contrast Given 03/06/24 2207)  Procedures .Critical Care  Performed by: Albertina Dixon, MD Authorized by: Albertina Dixon, MD   Critical care provider statement:    Critical care time (minutes):  30   Critical care was necessary to treat or prevent imminent or life-threatening deterioration of the following conditions:  CNS failure or compromise   Critical care was time spent personally by me on the following activities:  Development of treatment plan with patient or surrogate, discussions with consultants, evaluation of patient's response to treatment, examination of patient, ordering and review of laboratory studies, ordering and review of radiographic studies, ordering and performing treatments and interventions, pulse oximetry, re-evaluation of patient's condition and review of old charts   (including critical care time)  Medical Decision Making / ED Course   This patient presents to the ED for concern of headache, numbness, weakness, this involves an extensive number of treatment options, and is a complaint that carries with it a high risk of complications and morbidity.   The differential diagnosis includes CVA, hemorrhagic stroke, mass, Todd's paralysis, seizure, electrolyte abnormality, encephalopathy, complicated migraine  MDM: Patient seen emergency room for evaluation of a headache, numbness, weakness.  Physical exam with mild left-sided upper extremity weakness, subjective sensory deficit with tingling on the left side.  Initial head CT is normal.  Telestroke evaluated the patient at bedside and after discussing the risks and benefits of TNK decided to proceed with TNK administration.  Headache cocktail ordered for suspected hemiplegic migraine.  Troponin is unremarkable.  Spoke with Dr. Merrianne of neurology who accepts for neuro ICU transfer given TNK administration.  Patient transferred  Additional history obtained: -Additional history obtained from family member -External records from outside source obtained and reviewed including: Chart review including previous notes, labs, imaging, consultation notes   Lab Tests: -I ordered, reviewed, and interpreted labs.   The pertinent results include:   Labs Reviewed  COMPREHENSIVE METABOLIC PANEL WITH GFR - Abnormal; Notable for the following components:      Result Value   Glucose, Bld 259 (*)    Calcium  10.5 (*)    Alkaline Phosphatase 133 (*)    All other components within normal limits  CBG MONITORING, ED - Abnormal; Notable for the following components:   Glucose-Capillary 281 (*)    All other components within normal limits  MRSA NEXT GEN BY PCR, NASAL  PROTIME-INR  APTT  CBC  DIFFERENTIAL  ETHANOL  LIPID PANEL      EKG   EKG Interpretation Date/Time:  Monday March 06 2024 20:25:18 EDT Ventricular Rate:  85 PR Interval:  148 QRS Duration:  82 QT Interval:  370 QTC Calculation: 440 R Axis:   46  Text Interpretation: Normal sinus rhythm Normal ECG When compared with ECG of 07-Dec-2023 16:33, PREVIOUS ECG IS PRESENT Confirmed by Dezzie Badilla ([id number]) on 03/07/2024 1:53:40 AM          Imaging Studies ordered: I ordered imaging studies including CT head, CT angio head and neck I independently visualized and interpreted imaging. I agree with the radiologist interpretation   Medicines ordered and prescription drug management: Meds ordered this encounter  Medications   sodium chloride  flush (NS) 0.9 % injection 3 mL   prochlorperazine  (COMPAZINE ) injection 10 mg   diphenhydrAMINE  (BENADRYL ) injection 25 mg   lactated ringers  bolus 1,000 mL   tenecteplase  (TNKASE ) injection for Stroke 23 mg   tenecteplase  (TNKASE ) 50 MG injection for Stroke    Kennedy-Hager, Antho: cabinet override   iohexol  (OMNIPAQUE ) 350 MG/ML injection 75  mL    stroke: early stages of recovery book   0.9 %  sodium chloride  infusion   senna-docusate (Senokot-S) tablet 1 tablet   pantoprazole  (PROTONIX ) injection 40 mg   clevidipine  (CLEVIPREX ) infusion 0.5 mg/mL   rOPINIRole  (REQUIP ) tablet 4 mg   Oral care mouth rinse   Chlorhexidine  Gluconate Cloth 2 % PADS 6 each    -I have reviewed the patients home medicines and have made adjustments as needed  Critical interventions TNK administration, consultation with telemetry neurology  Consultations Obtained: I requested consultation with the teleneurologist,  and discussed lab and imaging findings as well as pertinent plan - they recommend: TNK   Cardiac Monitoring: The patient was maintained on a cardiac monitor.  I personally viewed and interpreted the cardiac monitored which showed an underlying rhythm of: NSR  Social Determinants of Health:  Factors impacting patients care include: none   Reevaluation: After the interventions noted above, I reevaluated the patient and found that they have :improved  Co morbidities that complicate the patient evaluation  Past Medical History:  Diagnosis Date   Ankylosing spondylitis (HCC)    Anxiety    Arthritis    back, hands, neck (09/18/2014)   Asthma    Atypical chest pain    Chronic  back pain    whole spine   Chronic bronchitis (HCC)    get it close to q yr (09/18/2014)   Chronic pain    Complicated migraine    Depression    Environmental allergies    Erosion of suburethral sling (HCC)    Fibromyalgia    Frequency of urination    GERD (gastroesophageal reflux disease)    Headache    at least several times/wk (09/18/2014)   Heart murmur    when I was a baby   Herpes simplex type 2 infection    History of angina    HLA B27 positive    Hypertension    Hypothyroidism    IBS (irritable bowel syndrome)    IC (interstitial cystitis)    Lesion of frontal lobe of brain    Migraine    maybe once/month (09/18/2014)   Nocturia    NSVD (normal spontaneous vaginal delivery)    x3   Osteoarthritis    Pneumonia    RLS (restless legs syndrome)    Sarcoid    Seizures (HCC)    when I was a teenager   Shortness of breath dyspnea    Exertion   SUI (stress urinary incontinence, female)    Thyroid  disease       Dispostion: I considered admission for this patient, and patient will require transfer to [facility], ICU for TNK administration     Final Clinical Impression(s) / ED Diagnoses Final diagnoses:  Acute nonintractable headache, unspecified headache type  Weakness     @PCDICTATION @    Albertina Dixon, MD 03/07/24 [phone]

## 2024-03-07 NOTE — TOC CAGE-AID Note (Signed)
 Transition of Care [facility]) - CAGE-AID Screening   Patient Details  Name: [name] MRN: [id number] Date of Birth: 09-10-1965  [facility] of [facility] CM/SW Contact:    Inocente GORMAN Kindle, LCSW Phone Number: 03/07/2024, 9:25 AM   Clinical Narrative: Patient reports only occasionally drinking alcohol and declined other substances besides cigarettes. Resources not indicated.    CAGE-AID Screening:    Have You Ever Felt You Ought to Cut Down on Your Drinking or Drug Use?: No Have People Annoyed You By Critizing Your Drinking Or Drug Use?: No Have You Felt Bad Or Guilty About Your Drinking Or Drug Use?: No Have You Ever Had a Drink or Used Drugs First Thing In The Morning to Steady Your Nerves or to Get Rid of a Hangover?: No CAGE-AID Score: 0  Substance Abuse Education Offered: No (not indicated)

## 2024-03-08 DIAGNOSIS — R29818 Other symptoms and signs involving the nervous system: Secondary | ICD-10-CM | POA: Diagnosis not present

## 2024-03-08 DIAGNOSIS — G43109 Migraine with aura, not intractable, without status migrainosus: Secondary | ICD-10-CM | POA: Diagnosis not present

## 2024-03-08 LAB — GLUCOSE, CAPILLARY
Glucose-Capillary: 181 mg/dL — ABNORMAL HIGH (ref 70–99)
Glucose-Capillary: 249 mg/dL — ABNORMAL HIGH (ref 70–99)

## 2024-03-08 NOTE — Inpatient Diabetes Management (Signed)
 Inpatient Diabetes Program Recommendations  AACE/ADA: New Consensus Statement on Inpatient Glycemic Control (2015)  Target Ranges:  Prepandial:   less than 140 mg/dL      Peak postprandial:   less than 180 mg/dL (1-2 hours)      Critically ill patients:  140 - 180 mg/dL   Lab Results  Component Value Date   GLUCAP 181 (H) 03/08/2024   HGBA1C 10.4 (H) 03/07/2024    Review of Glycemic Control  Diabetes history: DM 2 Outpatient Diabetes medications: Toujeo  30 units Daily, Novolog  24-28 units tid Current orders for Inpatient glycemic control:  Novolog  0-15 units tid A1c 10.4% on 7/8 Endocrinologist Dr. Isaias Sees them every 3 months  Inpatient Diabetes Program Recommendations:    -   Start Semglee  10 units  Thanks,  Clotilda Bull RN, MSN, BC-ADM Inpatient Diabetes Coordinator Team Pager [phone] (8a-5p)

## 2024-03-08 NOTE — Discharge Summary (Addendum)
 Stroke Discharge Summary  Patient ID: [name]   MRN: [id number]      DOB: December 13, 1965  Date of Admission: 03/06/2024 Date of Discharge: 03/08/2024  Attending Physician:  Stroke, Md, MD Consultant(s):    None  Patient's PCP:  Sabrina Anthony RAMAN, FNP  DISCHARGE PRIMARY DIAGNOSIS: Stroke-like symptoms: complicated migraine versus functional neurologic disorder   Secondary Diagnoses: Hypertension Hyperlipidemia Diabetes Type II, Uncontrolled Migraine headaches Remote history of seizures Obesity with BMI 32.7 kg/m  Anxiety/Depression   Allergies as of 03/08/2024       Reactions   Lyrica [pregabalin] Anaphylaxis   Topiramate  Other (See Comments)   Pt had hallucinations   Humira [adalimumab] Other (See Comments)   Muscle weakness   Infliximab  Hives, Itching, Rash, Other (See Comments)    infusion reaction   Percocet [oxycodone -acetaminophen ] Itching   Sulfamethoxazole-trimethoprim Other (See Comments)   Fever   Vicodin [hydrocodone -acetaminophen ] Itching        Medication List     TAKE these medications    albuterol  108 (90 Base) MCG/ACT inhaler Commonly known as: VENTOLIN  HFA Inhale 2 puffs into the lungs every 6 (six) hours as needed for wheezing or shortness of breath. What changed: how much to take   aspirin  EC 81 MG tablet Take 1 tablet (81 mg total) by mouth daily. Swallow whole.   butalbital -acetaminophen -caffeine  50-325-40 MG tablet Commonly known as: FIORICET  Take 1 tablet by mouth every 6 (six) hours as needed for headache or migraine. What changed: when to take this   celecoxib 200 MG capsule Commonly known as: CELEBREX Take 200 mg by mouth at bedtime.   Dexcom G7 Receiver Devi 1 Device by Does not apply route continuous. Change sensor every 10 days   Dexcom G7 Sensor Misc Use to check glucose continuously, change sensor every 10 days   fluticasone  furoate-vilanterol 100-25 MCG/ACT Aepb Commonly known as: Breo Ellipta  Inhale 1 puff  into the lungs daily.   gabapentin  100 MG capsule Commonly known as: NEURONTIN  Take 200 mg by mouth at bedtime.   hydrochlorothiazide 12.5 MG tablet Commonly known as: HYDRODIURIL Take 12.5 mg by mouth every morning.   levothyroxine  137 MCG tablet Commonly known as: SYNTHROID  Take 137 mcg by mouth daily before breakfast.   lipase/protease/amylase 12000-38000 units Cpep capsule Commonly known as: CREON Take 1-2 capsules by mouth See admin instructions. Take two capsules by mouth  with each meal and 1 capsule by mouth with a snack per patient   losartan 100 MG tablet Commonly known as: COZAAR Take 100 mg by mouth daily.   naloxone 2 MG/2ML injection Commonly known as: NARCAN Inject 0.4 mg into the skin daily as needed (for overdose).   NovoLOG  FlexPen 100 UNIT/ML FlexPen Generic drug: insulin  aspart Take 15 units plus sliding scale with meals 3 times a day, maximum 60 units/day. What changed:  how much to take how to take this when to take this additional instructions   omeprazole 40 MG capsule Commonly known as: PRILOSEC Take 80 mg by mouth every evening.   ondansetron  4 MG tablet Commonly known as: Zofran  Take 1 tablet (4 mg total) by mouth every 8 (eight) hours as needed for nausea or vomiting. What changed: when to take this   PARoxetine  40 MG tablet Commonly known as: PAXIL  Take 40 mg by mouth at bedtime.   promethazine  25 MG tablet Commonly known as: PHENERGAN  Take 1 tablet by mouth daily as needed for nausea/vomiting.   rOPINIRole  2 MG tablet  Commonly known as: REQUIP  Take 4 mg by mouth at bedtime.   rosuvastatin  10 MG tablet Commonly known as: CRESTOR  Take 10 mg by mouth at bedtime.   Semaglutide (0.25 or 0.5MG /DOS) 2 MG/3ML Sopn Inject 0.5 mg into the skin once a week.   Toujeo  Max SoloStar 300 UNIT/ML Solostar Pen Generic drug: insulin  glargine (2 Unit Dial ) Inject 30 Units into the skin daily.   traMADol  50 MG tablet Commonly known as:  ULTRAM  Take 1 tablet (50 mg total) by mouth every 6 (six) hours as needed.   valACYclovir  500 MG tablet Commonly known as: VALTREX  Take 500 mg by mouth at bedtime.   vitamin B-12 100 MCG tablet Commonly known as: CYANOCOBALAMIN  Take 100 mcg by mouth every evening.   Vitamin D3 1.25 MG (50000 UT) Caps Take 50,000 Units by mouth once a week. Thursday       LABORATORY STUDIES CBC    Component Value Date/Time   WBC 6.8 03/06/2024 2113   RBC 4.37 03/06/2024 2113   HGB 12.8 03/06/2024 2113   HCT 37.0 03/06/2024 2113   HCT 36.0 09/19/2014 1455   PLT 276 03/06/2024 2113   MCV 84.7 03/06/2024 2113   MCH 29.3 03/06/2024 2113   MCHC 34.6 03/06/2024 2113   RDW 13.6 03/06/2024 2113   LYMPHSABS 2.2 03/06/2024 2113   MONOABS 0.7 03/06/2024 2113   EOSABS 0.2 03/06/2024 2113   BASOSABS 0.0 03/06/2024 2113   CMP    Component Value Date/Time   NA 137 03/06/2024 2113   K 4.0 03/06/2024 2113   CL 103 03/06/2024 2113   CO2 22 03/06/2024 2113   GLUCOSE 259 (H) 03/06/2024 2113   BUN 14 03/06/2024 2113   CREATININE 0.84 03/06/2024 2113   CALCIUM  10.5 (H) 03/06/2024 2113   PROT 6.9 03/06/2024 2113   ALBUMIN 4.4 03/06/2024 2113   ALBUMIN NSER 03/12/2017 1528   AST 26 03/06/2024 2113   ALT 32 03/06/2024 2113   ALKPHOS 133 (H) 03/06/2024 2113   BILITOT 0.3 03/06/2024 2113   GFRNONAA >60 03/06/2024 2113   GFRAA >60 02/03/2020 0840   COAGS Lab Results  Component Value Date   INR 1.0 03/06/2024   INR 1.0 12/07/2023   INR 1.1 02/03/2020   Lipid Panel    Component Value Date/Time   CHOL 164 03/07/2024 0656   TRIG 125 03/07/2024 0656   HDL 49 03/07/2024 0656   CHOLHDL 3.3 03/07/2024 0656   VLDL 25 03/07/2024 0656   LDLCALC 90 03/07/2024 0656   HgbA1C  Lab Results  Component Value Date   HGBA1C 10.4 (H) 03/07/2024   Urine Drug Screen  Drugs of Abuse     Component Value Date/Time   LABOPIA NONE DETECTED 03/07/2024 0942   COCAINSCRNUR NONE DETECTED 03/07/2024 0942    LABBENZ NONE DETECTED 03/07/2024 0942   AMPHETMU NONE DETECTED 03/07/2024 0942   THCU NONE DETECTED 03/07/2024 0942   LABBARB NONE DETECTED 03/07/2024 0942    Alcohol Level    Component Value Date/Time   [facility] <15 03/06/2024 2113   SIGNIFICANT DIAGNOSTIC STUDIES CT HEAD WO CONTRAST ( ) Result Date: 03/07/2024 CLINICAL DATA:  Stroke, follow up EXAM: CT HEAD WITHOUT CONTRAST TECHNIQUE: Contiguous axial images were obtained from the base of the skull through the vertex without intravenous contrast. RADIATION DOSE REDUCTION: This exam was performed according to the departmental dose-optimization program which includes automated exposure control, adjustment of the mA and/or kV according to patient size and/or use of iterative reconstruction technique. COMPARISON:  CT  head 04/06/2024 FINDINGS: Brain: Trace stable patchy and confluent areas of decreased attenuation are noted throughout the deep and periventricular white matter of the cerebral hemispheres bilaterally, compatible with chronic microvascular ischemic disease. No evidence of large-territorial acute infarction. No parenchymal hemorrhage. No mass lesion. No extra-axial collection. No mass effect or midline shift. No hydrocephalus. Basilar cisterns are patent. Vascular: No hyperdense vessel. Skull: No acute fracture or focal lesion. Prior right frontal craniotomy. Sinuses/Orbits: Paranasal sinuses and mastoid air cells are clear. The orbits are unremarkable. Other: None. IMPRESSION: No acute intracranial abnormality. Electronically Signed   By: Morgane  Naveau M.D.   On: 03/07/2024 21:18   CT ANGIO HEAD NECK W WO CM (CODE STROKE) Result Date: 03/06/2024 CLINICAL DATA:  Neuro deficit, acute, stroke suspected EXAM: CT ANGIOGRAPHY HEAD AND NECK WITH AND WITHOUT CONTRAST TECHNIQUE: Multidetector CT imaging of the head and neck was performed using the standard protocol during bolus administration of intravenous contrast. Multiplanar CT image  reconstructions and MIPs were obtained to evaluate the vascular anatomy. Carotid stenosis measurements (when applicable) are obtained utilizing NASCET criteria, using the distal internal carotid diameter as the denominator. RADIATION DOSE REDUCTION: This exam was performed according to the departmental dose-optimization program which includes automated exposure control, adjustment of the mA and/or kV according to patient size and/or use of iterative reconstruction technique. CONTRAST:  75mL OMNIPAQUE  IOHEXOL  350 MG/ML SOLN COMPARISON:  Same day CT head. FINDINGS: CTA NECK FINDINGS Aortic arch: Great vessel origins are patent. Right carotid system: No evidence of dissection, stenosis (50% or greater), or occlusion. Left carotid system: No evidence of dissection, stenosis (50% or greater), or occlusion. Vertebral arteries: Left dominant. No evidence of dissection, stenosis (50% or greater), or occlusion. Skeleton: No acute abnormality on limited assessment. Other neck: No acute abnormality on limited assessment. Upper chest: Visualized lung apices are clear. Review of the MIP images confirms the above findings CTA HEAD FINDINGS Anterior circulation: Bilateral intracranial ICAs, if Ca is coma and ACAs are patent without proximal hemodynamically significant stenosis. Posterior circulation: Bilateral intradural vertebral arteries, basilar artery and bilateral posterior cerebral arteries are patent without proximal hemodynamically significant stenosis. Venous sinuses: As permitted by contrast timing, patent. Review of the MIP images confirms the above findings IMPRESSION: No emergent large vessel occlusion or proximal hemodynamically significant stenosis. Electronically Signed   By: Gilmore GORMAN Molt M.D.   On: 03/06/2024 22:39   CT HEAD CODE STROKE WO CONTRAST Result Date: 03/06/2024 CLINICAL DATA:  Code stroke.  Neuro deficit, acute, stroke suspected EXAM: CT HEAD WITHOUT CONTRAST TECHNIQUE: Contiguous axial images  were obtained from the base of the skull through the vertex without intravenous contrast. RADIATION DOSE REDUCTION: This exam was performed according to the departmental dose-optimization program which includes automated exposure control, adjustment of the mA and/or kV according to patient size and/or use of iterative reconstruction technique. COMPARISON:  None Available. FINDINGS: Brain: No evidence of acute vascular territory infarction, hemorrhage, hydrocephalus, extra-axial collection or mass lesion/mass effect. Similar patchy white matter hypodensities compatible with chronic microvascular ischemic change. Vascular: Calcific atherosclerosis.  No hyperdense vessel. Skull: Normal. Negative for fracture or focal lesion. Sinuses/Orbits: No acute finding. ASPECTS [facility] Stroke Program Early CT Score) Total score (0-10 with 10 being normal): IMPRESSION: 1. No evidence of acute abnormality. 2. ASPECTS is 10. Code stroke imaging results were communicated on 03/06/2024 at 8:54 pm to provider Dr. Albertina via telephone. Electronically Signed   By: Gilmore GORMAN Molt M.D.   On: 03/06/2024 20:54    HISTORY OF  PRESENT ILLNESS 58 y.o. patient with history of complicated migraine headaches, HTN, DM2, seizures, fibromyalgia, spinal cord stimulator placement due to chronic back pain, anxiety, depression, GERD, osteoarthritis, RLS, and hypothyroidism was admitted with acute onset of headache with left-sided numbness and weakness in addition to right-sided vision loss with initial NIHSS of 5.  She was seen by teleneurology at [facility] and given TNKase  prior to transfer to [facility] for post-TNK management and stroke work up.   HOSPITAL COURSE Stroke like symptoms s/p TNK, could be complicated migraine versus functional neurologic disorder Code Stroke CT head No acute abnormality. ASPECTS 10.    CTA head & neck no LVO MRI able to obtain due to spinal stimulator not MRI compatible 24 hours post TNK CT  head No acute intracranial abnormality  2D Echo 11/2023 EF 60 to 65% LDL 90 HgbA1c 10.4 UDS negative VTE prophylaxis - up ad lib  aspirin  81 mg daily prior to admission, now on home aspirin  81 mg PO daily  Therapy recommendations:  None Disposition: Discharge to home    Hx of stroke like symptoms 08/2014 admitted for left-sided weakness, neglect and hemianopia.  MRI negative, considered functional neurological disorder 10/2014 presented with headache.  MRI no infarct.  Found to have left frontal bone enhancing lesion, status post left frontal craniectomy for biopsy followed by cranioplasty 12/2014 presented for left face and arm tingling, left hemianopia.  MRI no acute infarct.  CTA head and neck unremarkable. 02/2017 admitted for left-sided weakness.  MRI negative.  Had LP and CSF negative for CNS infection.  Diagnosed with complicated migraine 11/2023 admitted for headache, left-sided weakness and numbness.  CT no acute abnormality.  Not able to have MRI due to spinal cord stimulator.  Exam consistent with functional component.  EF 60 to 65%, LDL 116, A1c 10.0.   Migraines Remote history of seizures Follows with neurology at [facility] Not on prophylactic migraine meds Seizures as a teenager on no medications Takes Fioricet  PRN at home, denies overuse Also has [place] PRN  Plans to follow up outpatient with established headache specialist through [facility]   Hypertension Home meds: HCTZ 12.5, losartan 100 mg Stable Blood Pressure Goal: gradual normotension Long term BP goal normotensive   HLD Home meds: crestor  10 LDL 90, goal < 100 Now on home crestor  10 Continue at discharge   Diabetes type II UnControlled Home meds: Semaglutide , insulin  HgbA1c 10.4, goal < 7.0 CBGs SSI Diabetes management consulted Recommend close follow-up with PCP/endocrinology for better DM control   Other Stroke Risk Factors ETOH use, alcohol level <15, advised to drink no more than 1 drink(s) a  day Obesity, Body mass index is 32.7 kg/m., BMI >/= 30 associated with increased stroke risk, recommend weight loss, diet and exercise as appropriate    Other Active Problems Anxiety and depression On home Paxil   GERD Hypothyroidism Restless leg On home Requip   Fibromyalgia Asthma Chronic back pain s/p spinal cord stimulator in 2019 Gabapentin  200 mg at bedtime Tramadol  50 mg every six hours PRN at home   DISCHARGE EXAM PHYSICAL EXAM General:  Alert, well-nourished, well-developed patient in no acute distress Psych:  Mood and affect appropriate for situation, calm and cooperative with exam CV: Regular rate and rhythm on monitor Respiratory:  Regular, unlabored respirations on room air. No respiratory distress.  GI: Abdomen soft and nontender    NEURO:  Mental Status: AA&Ox3, patient is able to give clear and coherent history Speech/Language: speech is without dysarthria or  aphasia.  Naming, repetition, fluency, and comprehension intact.   Cranial Nerves:  II: PERRL. Visual fields full.  III, IV, VI: EOMI. Eyelids elevate symmetrically.  V: Sensation is intact to light touch and symmetrical to face.  VII: Face is symmetrical resting and with movement VIII: Hearing intact to voice. IX, X: Palate elevates symmetrically. Phonation is normal.  KP:Dynloizm shrug 5/5. XII: Tongue protrudes midline Motor: Equal and spontaneous movement in all 4 extremities, no weakness or asymmetry noted Tone: is normal and bulk is normal Sensation- Intact to light touch bilaterally.  Coordination: No ataxic movement appreciated Gait- steady ambulation in hallway with OT   Most Recent NIH 0   Discharge Diet       Diet   Diet Heart Room service appropriate? Yes; Fluid consistency: Thin   DISCHARGE PLAN Disposition: Home aspirin  81 mg daily for secondary stroke prevention  Ongoing stroke risk factor control by Primary Care Physician at time of discharge Follow-up PCP Sabrina Anthony RAMAN, FNP  in 2 weeks. Follow up with established headache specialist at [facility], Dr. Leopoldo  35 minutes were spent preparing discharge.  Stevi Toberman, AGACNP-BC [facility] Pager: [phone]  ATTENDING NOTE: I reviewed above note and agree with the assessment and plan. Pt was seen and examined.   No family at the bedside. Pt lying in bed, neuro intact, still complaining of mild HA since this morning, not effective with tylenol . Received fioricet  and gabapentin  PRN. CT repeat no acute finding. PT and OT no recs. Will continue to follow up at [facility].   For detailed assessment and plan, please refer to above as I have made changes wherever appropriate.   Ary Cummins, MD PhD Stroke Neurology 03/08/2024 4:14 PM

## 2024-03-08 NOTE — TOC CM/SW Note (Signed)
 Transition of Care [facility]) - Inpatient Brief Assessment   Patient Details  Name: Texanna Hilburn MRN: [id number] Date of Birth: 1966-05-13  Transition of Care [facility]) CM/SW Contact:    Inocente GORMAN Kindle, LCSW Phone Number: 03/08/2024, 9:51 AM   Clinical Narrative: Patient admitted from home with son and fiance. No current TOC needs identified but please consult if needs arise.    Transition of Care Asessment: Insurance and Status: Insurance coverage has been reviewed Patient has primary care physician: Yes Home environment has been reviewed: From home Prior level of function:: Independent Prior/Current Home Services: No current home services Social Drivers of Health Review: SDOH reviewed no interventions necessary Readmission risk has been reviewed: Yes Transition of care needs: no transition of care needs at this time

## 2024-03-08 NOTE — Evaluation (Signed)
 Occupational Therapy Evaluation Patient Details Name: [name] MRN: [id number] DOB: [birth year] Today's Date: 03/08/2024   History of Present Illness   58 yo female who presents to [facility] on 03/06/24 with c/o headaches and L sided weakness and numbness. Initial head CT was unremarkable. Pt was admitted and is s/p TNK administration. PMH including chronic back pain and cervical post laminectomy syndrome with spinal stimulator, hx of lumbar fusion, HTN, migraine, asthma, hypothyroidism.     Clinical Impressions Pt reports ind at baseline with ADLs and functional mobility, lives with family who can assist at d/c. Pt endorses multiple falls in the past, currently needs up to min A for ADLs, supervision for bed mobility and min A for transfers with 1 person HHA. Pt reports incr dizziness with ambulation, reaching out for countertops and external support with HHA while walking. BP 105/74 (83) at start of session, 124/75 (89) at end of session, RN notified. Pt presenting with impairments listed below, will follow acutely. Anticipate no OT follow up needs at d/c.      If plan is discharge home, recommend the following:   A little help with walking and/or transfers;A little help with bathing/dressing/bathroom;Assistance with cooking/housework;Direct supervision/assist for medications management;Direct supervision/assist for financial management;Assist for transportation;Help with stairs or ramp for entrance     Functional Status Assessment   Patient has had a recent decline in their functional status and demonstrates the ability to make significant improvements in function in a reasonable and predictable amount of time.     Equipment Recommendations   None recommended by OT     Recommendations for Other Services   PT consult     Precautions/Restrictions   Precautions Precautions: Fall Recall of Precautions/Restrictions: Intact Restrictions Weight Bearing Restrictions Per  Provider Order: No     Mobility Bed Mobility Overal bed mobility: Needs Assistance Bed Mobility: Supine to Sit, Sit to Supine     Supine to sit: Supervision, HOB elevated Sit to supine: Supervision, HOB elevated        Transfers Overall transfer level: Needs assistance Equipment used: 1 person hand held assist Transfers: Sit to/from Stand Sit to Stand: Min assist           General transfer comment: pt holding therapist's hand and reaching out for external support with short distance hall ambulation, pt reports incr dizziness BP WNL      Balance Overall balance assessment: Needs assistance Sitting-balance support: No upper extremity supported, Feet supported Sitting balance-Leahy Scale: Good Sitting balance - Comments: seated EOB   Standing balance support: No upper extremity supported, During functional activity Standing balance-Leahy Scale: Fair Standing balance comment: able to shift weight throughout gait w/ one instnace of instability                           ADL either performed or assessed with clinical judgement   ADL Overall ADL's : Needs assistance/impaired Eating/Feeding: Supervision/ safety   Grooming: Supervision/safety   Upper Body Bathing: Supervision/ safety   Lower Body Bathing: Supervison/ safety   Upper Body Dressing : Supervision/safety   Lower Body Dressing: Supervision/safety   Toilet Transfer: Minimal assistance                   Vision Baseline Vision/History: 1 Wears glasses Additional Comments: painful gaze at L end range per pt, needing to close eyes, pt needs cues to look at therapist's finger during finger to nose     Perception  Perception: Not tested       Praxis Praxis: Not tested       Pertinent Vitals/Pain Pain Assessment Pain Assessment: Faces Pain Score: 6  Faces Pain Scale: Hurts little more Pain Location: headache Pain Descriptors / Indicators: Discomfort Pain Intervention(s): Limited  activity within patient's tolerance, Monitored during session, Repositioned     Extremity/Trunk Assessment Upper Extremity Assessment Upper Extremity Assessment: Overall WFL for tasks assessed;Right hand dominant   Lower Extremity Assessment Lower Extremity Assessment: Defer to PT evaluation   Cervical / Trunk Assessment Cervical / Trunk Assessment: Normal   Communication Communication Communication: No apparent difficulties   Cognition Arousal: Alert Behavior During Therapy: WFL for tasks assessed/performed Cognition: No apparent impairments                               Following commands: Intact       Cueing  General Comments   Cueing Techniques: Verbal cues  see note for BP measures   Exercises     Shoulder Instructions      Home Living Family/patient expects to be discharged to:: Private residence Living Arrangements: Spouse/significant other;Children Available Help at Discharge: Family;Available PRN/intermittently Type of Home: House Home Access: Stairs to enter [employer] of Steps: 3-4 Entrance Stairs-Rails: Can reach both;Right;Left Home Layout: One level     Bathroom Shower/Tub: Producer, television/film/video: Standard Bathroom Accessibility: Yes   Home Equipment: Grab bars - tub/shower;Grab bars - toilet;Shower seat - built Charity fundraiser (2 wheels);Wheelchair Financial trader (4 wheels)          Prior Functioning/Environment Prior Level of Function : Independent/Modified Independent;Driving             Mobility Comments: hx of multiple falls ADLs Comments: ind    OT Problem List: Decreased strength;Decreased range of motion;Decreased activity tolerance;Impaired balance (sitting and/or standing);Impaired vision/perception   OT Treatment/Interventions: Self-care/ADL training;Therapeutic exercise;Energy conservation;DME and/or AE instruction;Therapeutic activities;Patient/family education;Balance training       OT Goals(Current goals can be found in the care plan section)   Acute Rehab OT Goals Patient Stated Goal: none stated OT Goal Formulation: With patient Time For Goal Achievement: 03/22/24 Potential to Achieve Goals: Good ADL Goals Pt Will Perform Upper Body Dressing: Independently;sitting Pt Will Perform Lower Body Dressing: Independently;sitting/lateral leans;sit to/from stand Pt Will Transfer to Toilet: Independently;ambulating;regular height toilet Pt Will Perform Tub/Shower Transfer: Tub transfer;Shower transfer;Independently;ambulating;shower seat   OT Frequency:  Min 2X/week    Co-evaluation              AM-PAC OT 6 Clicks Daily Activity     Outcome Measure Help from another person eating meals?: None Help from another person taking care of personal grooming?: None Help from another person toileting, which includes using toliet, bedpan, or urinal?: A Little Help from another person bathing (including washing, rinsing, drying)?: A Little Help from another person to put on and taking off regular upper body clothing?: A Little Help from another person to put on and taking off regular lower body clothing?: A Little 6 Click Score: 20   End of Session Nurse Communication: Mobility status  Activity Tolerance: Patient tolerated treatment well Patient left: in bed;with call bell/phone within reach;with bed alarm set  OT Visit Diagnosis: Unsteadiness on feet (R26.81);Other abnormalities of gait and mobility (R26.89);Muscle weakness (generalized) (M62.81);History of falling (Z91.81)                Time: [phone]  OT Time Calculation (min): 15 min Charges:  OT General Charges $OT Visit: 1 Visit OT Evaluation $OT Eval Low Complexity: 1 Low  Matthew Pais K, OTD, OTR/L [employer] Preferred Acute Rehab [phone]   Laneta POUR Koonce 03/08/2024, 9:34 AM

## 2024-03-09 DIAGNOSIS — M455 Ankylosing spondylitis of thoracolumbar region: Secondary | ICD-10-CM | POA: Diagnosis not present

## 2024-03-09 DIAGNOSIS — G43709 Chronic migraine without aura, not intractable, without status migrainosus: Secondary | ICD-10-CM | POA: Diagnosis not present

## 2024-03-09 DIAGNOSIS — M47816 Spondylosis without myelopathy or radiculopathy, lumbar region: Secondary | ICD-10-CM | POA: Diagnosis not present

## 2024-03-09 DIAGNOSIS — Z9689 Presence of other specified functional implants: Secondary | ICD-10-CM | POA: Diagnosis not present

## 2024-03-09 DIAGNOSIS — M961 Postlaminectomy syndrome, not elsewhere classified: Secondary | ICD-10-CM | POA: Diagnosis not present

## 2024-03-14 DIAGNOSIS — I10 Essential (primary) hypertension: Secondary | ICD-10-CM | POA: Diagnosis not present

## 2024-03-14 DIAGNOSIS — E1165 Type 2 diabetes mellitus with hyperglycemia: Secondary | ICD-10-CM | POA: Diagnosis not present

## 2024-03-14 DIAGNOSIS — Z09 Encounter for follow-up examination after completed treatment for conditions other than malignant neoplasm: Secondary | ICD-10-CM | POA: Diagnosis not present

## 2024-03-14 DIAGNOSIS — Z8673 Personal history of transient ischemic attack (TIA), and cerebral infarction without residual deficits: Secondary | ICD-10-CM | POA: Diagnosis not present

## 2024-03-18 DIAGNOSIS — I10 Essential (primary) hypertension: Secondary | ICD-10-CM | POA: Diagnosis not present

## 2024-03-18 DIAGNOSIS — E1122 Type 2 diabetes mellitus with diabetic chronic kidney disease: Secondary | ICD-10-CM | POA: Diagnosis not present

## 2024-03-18 DIAGNOSIS — N1831 Chronic kidney disease, stage 3a: Secondary | ICD-10-CM | POA: Diagnosis not present

## 2024-03-18 DIAGNOSIS — E1165 Type 2 diabetes mellitus with hyperglycemia: Secondary | ICD-10-CM | POA: Diagnosis not present

## 2024-03-22 DIAGNOSIS — M459 Ankylosing spondylitis of unspecified sites in spine: Secondary | ICD-10-CM | POA: Diagnosis not present

## 2024-03-28 DIAGNOSIS — R413 Other amnesia: Secondary | ICD-10-CM | POA: Diagnosis not present

## 2024-03-28 DIAGNOSIS — E1165 Type 2 diabetes mellitus with hyperglycemia: Secondary | ICD-10-CM | POA: Diagnosis not present

## 2024-03-28 DIAGNOSIS — I10 Essential (primary) hypertension: Secondary | ICD-10-CM | POA: Diagnosis not present

## 2024-03-30 DIAGNOSIS — N1831 Chronic kidney disease, stage 3a: Secondary | ICD-10-CM | POA: Diagnosis not present

## 2024-03-30 DIAGNOSIS — E1122 Type 2 diabetes mellitus with diabetic chronic kidney disease: Secondary | ICD-10-CM | POA: Diagnosis not present

## 2024-03-30 DIAGNOSIS — E039 Hypothyroidism, unspecified: Secondary | ICD-10-CM | POA: Diagnosis not present

## 2024-03-30 DIAGNOSIS — I10 Essential (primary) hypertension: Secondary | ICD-10-CM | POA: Diagnosis not present

## 2024-03-30 DIAGNOSIS — E1165 Type 2 diabetes mellitus with hyperglycemia: Secondary | ICD-10-CM | POA: Diagnosis not present

## 2024-04-06 DIAGNOSIS — M0579 Rheumatoid arthritis with rheumatoid factor of multiple sites without organ or systems involvement: Secondary | ICD-10-CM | POA: Diagnosis not present

## 2024-04-06 DIAGNOSIS — Z79899 Other long term (current) drug therapy: Secondary | ICD-10-CM | POA: Diagnosis not present

## 2024-04-06 DIAGNOSIS — G894 Chronic pain syndrome: Secondary | ICD-10-CM | POA: Diagnosis not present

## 2024-04-06 DIAGNOSIS — Z6830 Body mass index (BMI) 30.0-30.9, adult: Secondary | ICD-10-CM | POA: Diagnosis not present

## 2024-04-06 DIAGNOSIS — M459 Ankylosing spondylitis of unspecified sites in spine: Secondary | ICD-10-CM | POA: Diagnosis not present

## 2024-04-06 DIAGNOSIS — D8689 Sarcoidosis of other sites: Secondary | ICD-10-CM | POA: Diagnosis not present

## 2024-04-06 DIAGNOSIS — E669 Obesity, unspecified: Secondary | ICD-10-CM | POA: Diagnosis not present

## 2024-04-07 DIAGNOSIS — Z01818 Encounter for other preprocedural examination: Secondary | ICD-10-CM | POA: Diagnosis not present

## 2024-04-07 DIAGNOSIS — T85192A Other mechanical complication of implanted electronic neurostimulator (electrode) of spinal cord, initial encounter: Secondary | ICD-10-CM | POA: Diagnosis not present

## 2024-04-07 DIAGNOSIS — E1165 Type 2 diabetes mellitus with hyperglycemia: Secondary | ICD-10-CM | POA: Diagnosis not present

## 2024-04-10 DIAGNOSIS — J4540 Moderate persistent asthma, uncomplicated: Secondary | ICD-10-CM | POA: Diagnosis not present

## 2024-04-13 ENCOUNTER — Ambulatory Visit: Payer: Self-pay | Admitting: Endocrinology

## 2024-04-13 ENCOUNTER — Ambulatory Visit (INDEPENDENT_AMBULATORY_CARE_PROVIDER_SITE_OTHER): Admitting: Endocrinology

## 2024-04-13 ENCOUNTER — Encounter: Payer: Self-pay | Admitting: Endocrinology

## 2024-04-13 VITALS — BP 138/80 | HR 100 | Resp 20 | Ht <= 58 in | Wt 195.0 lb

## 2024-04-13 DIAGNOSIS — E1165 Type 2 diabetes mellitus with hyperglycemia: Secondary | ICD-10-CM

## 2024-04-13 DIAGNOSIS — Z794 Long term (current) use of insulin: Secondary | ICD-10-CM | POA: Diagnosis not present

## 2024-04-13 LAB — POCT GLYCOSYLATED HEMOGLOBIN (HGB A1C): Hemoglobin A1C: 10.2 % — AB (ref 4.0–5.6)

## 2024-04-13 MED ORDER — TIRZEPATIDE 2.5 MG/0.5ML ~~LOC~~ SOAJ: 2.5000 mg | SUBCUTANEOUS | 3 refills | Status: AC

## 2024-04-13 MED ORDER — TOUJEO MAX SOLOSTAR 300 UNIT/ML ~~LOC~~ SOPN: 50.0000 [IU] | PEN_INJECTOR | Freq: Every day | SUBCUTANEOUS | 3 refills | Status: AC

## 2024-04-13 MED ORDER — NOVOLOG FLEXPEN 100 UNIT/ML ~~LOC~~ SOPN
PEN_INJECTOR | SUBCUTANEOUS | 4 refills | Status: AC
Start: 2024-04-13 — End: ?

## 2024-04-13 MED ORDER — INSULIN PEN NEEDLE 32G X 4 MM MISC: 3 refills | Status: DC

## 2024-04-13 MED ORDER — EMPAGLIFLOZIN 25 MG PO TABS: 25.0000 mg | ORAL_TABLET | Freq: Every day | ORAL | 3 refills | Status: AC

## 2024-04-13 MED ORDER — DEXCOM G7 SENSOR MISC
3 refills | Status: AC
Start: 2024-04-13 — End: ?

## 2024-04-13 NOTE — Patient Instructions (Signed)
 Diabetes regimen  Increase Toujeo  to 50  units daily in the morning. Take novolog  25  units with meals 3 times a day before eating, plus sliding scale as follows.  Moderate Sliding Scale Blood Glucose        Insulin  60-150                     None 151-200                   3 units 201-250                   5 units 251-300                   7 units 301-350                   9 units 351-400                  11 units    >400                       12 units and call provider  For snacks take 5 units of novolog .  Start mounjaro  2.5mg  weekly. Start jardiacne 25mg  daily.

## 2024-04-13 NOTE — Progress Notes (Signed)
 Outpatient Endocrinology Note [place] Ivonne [place], MD   Patient's Name: [name]    DOB: [birth year]    MRN: [id number]                                                    REASON OF VISIT: Follow-up for type 2 diabetes mellitus  REFERRING PROVIDER: [name], FNP  PCP: [name], FNP  HISTORY OF PRESENT ILLNESS:   [name] is a 58 y.o. old female with past medical history listed below, is here for follow-up for type 2 diabetes mellitus.   Pertinent Diabetes History: Patient was diagnosed with type 2 diabetes mellitus in 2020.  Patient has uncontrolled type 2 diabetes mellitus.  No personal history of pancreatitis and / or family history of medullary thyroid  carcinoma or MEN 2B syndrome.   Patient reports she may have had acute pancreatitis in March 2024 while evaluated in the ER, with a review of the records in chart CT abdomen at that time was unremarkable pancreas, lipase was less than 10, not elevated, was not mentioned above diagnosis of acute pancreatitis.  Patient reports she has been following with [facility] gastroenterology and reports has exocrine pancreatic insufficiency and takes Creon intermittently.  No records available to review.  Chronic Diabetes Complications : Retinopathy: no. Last ophthalmology exam was done on annually, reportedly, following with ophthalmology regularly.  Nephropathy: no Peripheral neuropathy: no Coronary artery disease: on Stroke: on  Relevant comorbidities and cardiovascular risk factors: Obesity: yes Body mass index is 952.08 kg/m.  Hypertension: Yes  Hyperlipidemia : Yes, on statin   Current / Home Diabetic regimen includes:  Toujeo  30 units daily in the morning.  Humalog  sliding scale 20 units 2 times a day.  Prior diabetic medications: Metformin  regular and extended release, had a GI intolerance and stopped.  She had taken Mounjaro  in the past, switched to Ozempic . Ozempic  makes sickness / nausea.  Used to be  on Ozempic  was stopped due to GI intolerance.  Glycemic data:    CONTINUOUS GLUCOSE MONITORING SYSTEM (CGMS) INTERPRETATION: At today's visit, we reviewed CGM downloads. The full report is scanned in the media. Reviewing the CGM trends, blood glucose are as follows:  Dexcom G7 CGM  Sensor Download (Sensor download was reviewed and summarized below.) Dates: August 1 to April 13, 2024, 14 days Sensor Average: 310  Glucose Management Indicator: 10.7%  % data captured: 89%     Previous:    Interpretation: Significant and persistent hyperglycemia.  Most of the blood sugar 300-400 range especially postprandially.  Some of the days persistent significant hyperglycemia in the 400 range with mild lowering in between.  Some of the days blood sugar from 350-400 range improving to low 200 range related to using mealtime insulin .  No hypoglycemia.  Patient reports compliance with insulin  regimen mentioned above.  Hypoglycemia: Patient has no hypoglycemic episodes. Patient has hypoglycemia awareness.  Factors modifying glucose control: 1.  Diabetic diet assessment: 3 meals a day and sometimes bedtime snack.  2.  Staying active or exercising:   3.  Medication compliance: compliant most of the time.  Interval history  Significant hyperglycemia.  CGM data as reviewed above.  Hemoglobin A1c today 10.2%.  Hemoglobin A1c is 10.4% last month.  Patient wanted to recheck hemoglobin A1c today.  Patient was waiting to have surgery  to change nerve stimulator, and wants better diabetes control.  Diabetes regimen as reviewed above.  Patient stated compliance with both long-acting and mealtime insulin .  She would like to start low-dose of Mounjaro .  No other complaints today.  Denies history of vaginal yeast infection.  REVIEW OF SYSTEMS As per history of present illness.   PAST MEDICAL HISTORY: Past Medical History:  Diagnosis Date   Ankylosing spondylitis (HCC)    Anxiety    Arthritis     back, hands, neck (09/18/2014)   Asthma    Atypical chest pain    Chronic back pain    whole spine   Chronic bronchitis (HCC)    get it close to q yr (09/18/2014)   Chronic pain    Complicated migraine    Depression    Environmental allergies    Erosion of suburethral sling (HCC)    Fibromyalgia    Frequency of urination    GERD (gastroesophageal reflux disease)    Headache    at least several times/wk (09/18/2014)   Heart murmur    when I was a baby   Herpes simplex type 2 infection    History of angina    HLA B27 positive    Hypertension    Hypothyroidism    IBS (irritable bowel syndrome)    IC (interstitial cystitis)    Lesion of frontal lobe of brain    Migraine    maybe once/month (09/18/2014)   Nocturia    NSVD (normal spontaneous vaginal delivery)    x3   Osteoarthritis    Pneumonia    RLS (restless legs syndrome)    Sarcoid    Seizures (HCC)    when I was a teenager   Shortness of breath dyspnea    Exertion   SUI (stress urinary incontinence, female)    Thyroid  disease     PAST SURGICAL HISTORY: Past Surgical History:  Procedure Laterality Date   ABDOMINAL HYSTERECTOMY  2006   ANTERIOR CERVICAL DECOMP/DISCECTOMY FUSION  2011   APPENDECTOMY  1985   ARTHROPLASTY     Left thumb   BACK SURGERY  2008   X 2 in 2010   BACK SURGERY     CESAREAN SECTION  2000   CRANIECTOMY FOR DEPRESSED SKULL FRACTURE Left 12/05/2014   Procedure: Left frontal stereotactic craniectomy for biopsy of skull lesion;  Surgeon: Gerldine Maizes, MD;  Location: [facility];  Service: Neurosurgery;  Laterality: Left;  Left frontal stereotactic craniectomy for biopsy of skull lesion   CYSTO WITH HYDRODISTENSION N/A 11/30/2012   Procedure: CYSTOSCOPY/HYDRODISTENSION;  Surgeon: Alm GORMAN Fragmin, MD;  Location: [facility];  Service: Urology;  Laterality: N/A;   KNEE ARTHROSCOPY Right    LUMBAR DISC SURGERY  02-02-2007   RIGHT SIDE L5 -- S1   LUMBAR FUSION   12-04-2008   L4  -- S1   LYNX RETROPUBIC SUBURETHRAL SLING  03-02-2007   MASS EXCISION Left 03/19/2017   Procedure: EXCISION LEFT WRIST VOLAR MASS;  Surgeon: Sissy Cough, MD;  Location: [facility] OR;  Service: Orthopedics;  Laterality: Left;   MEDIASTINOSCOPY N/A 10/26/2014   Procedure: MEDIASTINOSCOPY;  Surgeon: Elspeth JAYSON Millers, MD;  Location: [facility] OR;  Service: Thoracic;  Laterality: N/A;   NECK SURGERY     PELVIC LAPAROSCOPY  1990's   LYSIS ADHESIONS   PUBOVAGINAL SLING N/A 11/30/2012   Procedure: CARLOYN GLADE;  Surgeon: Alm GORMAN Fragmin, MD;  Location: [facility];  Service: Urology;  Laterality: N/A;  1 HR EXAM UNDER ANESTHESIA, EXCISION OF SUB URETHRAL MESH, CYSTO, HOD    SPINAL CORD STIMULATOR IMPLANT     TONSILLECTOMY  1986   TUBAL LIGATION  2001   hulka clip   VIDEO BRONCHOSCOPY WITH ENDOBRONCHIAL ULTRASOUND N/A 09/21/2014   Procedure: VIDEO BRONCHOSCOPY WITH ENDOBRONCHIAL ULTRASOUND;  Surgeon: Lamar GORMAN Chris, MD;  Location: [facility] OR;  Service: Thoracic;  Laterality: N/A;    ALLERGIES: Allergies  Allergen Reactions   Lyrica [Pregabalin] Anaphylaxis   Topiramate  Other (See Comments)    Pt had hallucinations   Humira [Adalimumab] Other (See Comments)    Muscle weakness   Infliximab  Hives, Itching, Rash and Other (See Comments)     infusion reaction   Percocet [Oxycodone -Acetaminophen ] Itching   Sulfamethoxazole-Trimethoprim Other (See Comments)    Fever     Vicodin [Hydrocodone -Acetaminophen ] Itching    FAMILY HISTORY:  Family History  Problem Relation Age of Onset   Diabetes Mother    Hypertension Mother    Heart disease Mother    Cancer Father        LUNG AND BRAIN   Hypertension Father    Heart disease Maternal Grandmother    Cancer Maternal Grandfather        LUNG   Heart disease Paternal Grandmother    Cancer Paternal Grandfather     SOCIAL HISTORY: Social History   Socioeconomic History   Marital status: Married    Spouse name:  Not on file   Number of children: Not on file   Years of education: Not on file   Highest education level: Not on file  Occupational History   Occupation: unemployed due to medical reasons  Tobacco Use   Smoking status: Former    Current packs/day: 0.00    Average packs/day: 0.5 packs/day for 15.0 years (7.5 ttl pk-yrs)    Types: Cigarettes    Start date: 01/16/1999    Quit date: 01/15/2014    Years since quitting: 10.2   Smokeless tobacco: Never  Vaping Use   Vaping status: Former  Substance and Sexual Activity   Alcohol use: Not Currently    Comment: few glasses a week   Drug use: Never    Comment: marijuana medicinal patches on her back - 03/18/17 states she no longer uses this   Sexual activity: Not Currently  Other Topics Concern   Not on file  Social History Narrative   ** Merged History Encounter **       Social Drivers of Health   Financial Resource Strain: Low Risk  (10/25/2023)   Received from [facility]   Overall Financial Resource Strain (CARDIA)    Difficulty of Paying Living Expenses: Not hard at all  Food Insecurity: No Food Insecurity (03/07/2024)   Hunger Vital Sign    Worried About Running Out of Food in the Last Year: Never true    Ran Out of Food in the Last Year: Never true  Transportation Needs: No Transportation Needs (03/07/2024)   PRAPARE - Administrator, Civil Service (Medical): No    Lack of Transportation (Non-Medical): No  Physical Activity: Unknown (10/25/2023)   Received from [facility]   Exercise Vital Sign    On average, how many days per week do you engage in moderate to strenuous exercise (like a brisk walk)?: 0 days    Minutes of Exercise per Session: Not on file  Stress: No Stress Concern Present (10/25/2023)   Received from [facility]  [facility] - Occupational Stress Questionnaire    Feeling of Stress : Not at all  Social Connections: Moderately Integrated (10/25/2023)   Received from  [facility]   Social Network    How would you rate your social network (family, work, friends)?: Adequate participation with social networks    MEDICATIONS:  Current Outpatient Medications  Medication Sig Dispense Refill   albuterol  (VENTOLIN  HFA) 108 (90 Base) MCG/ACT inhaler Inhale 2 puffs into the lungs every 6 (six) hours as needed for wheezing or shortness of breath. 8 g 6   aspirin  EC 81 MG tablet Take 1 tablet (81 mg total) by mouth daily. Swallow whole. 30 tablet 12   butalbital -acetaminophen -caffeine  (FIORICET ) 50-325-40 MG tablet Take 1 tablet by mouth every 6 (six) hours as needed for headache or migraine. 14 tablet 0   celecoxib (CELEBREX) 200 MG capsule Take 200 mg by mouth at bedtime.     Cholecalciferol (VITAMIN D3) 1.25 MG (50000 UT) CAPS Take 50,000 Units by mouth once a week. Thursday     Continuous Glucose Receiver (DEXCOM G7 RECEIVER) DEVI 1 Device by Does not apply route continuous. Change sensor every 10 days 2 each 1   empagliflozin  (JARDIANCE ) 25 MG TABS tablet Take 1 tablet (25 mg total) by mouth daily before breakfast. 90 tablet 3   fluticasone  furoate-vilanterol (BREO ELLIPTA ) 100-25 MCG/ACT AEPB Inhale 1 puff into the lungs daily. 30 each 5   gabapentin  (NEURONTIN ) 100 MG capsule Take 200 mg by mouth at bedtime.     hydrochlorothiazide (HYDRODIURIL) 12.5 MG tablet Take 12.5 mg by mouth every morning.     Insulin  Pen Needle 32G X 4 MM MISC Use 4x a day 300 each 3   levothyroxine  (SYNTHROID , LEVOTHROID) 137 MCG tablet Take 137 mcg by mouth daily before breakfast.     lipase/protease/amylase (CREON) 12000-38000 units CPEP capsule Take 1-2 capsules by mouth See admin instructions. Take two capsules by mouth  with each meal and 1 capsule by mouth with a snack per patient     losartan (COZAAR) 100 MG tablet Take 100 mg by mouth daily.     naloxone (NARCAN) 2 MG/2ML injection Inject 0.4 mg into the skin daily as needed (for overdose).     omeprazole (PRILOSEC) 40 MG  capsule Take 80 mg by mouth every evening.     ondansetron  (ZOFRAN ) 4 MG tablet Take 1 tablet (4 mg total) by mouth every 8 (eight) hours as needed for nausea or vomiting. 20 tablet 0   PARoxetine  (PAXIL ) 40 MG tablet Take 40 mg by mouth at bedtime.  3   promethazine  (PHENERGAN ) 25 MG tablet Take 1 tablet by mouth daily as needed for nausea/vomiting.     rOPINIRole  (REQUIP ) 2 MG tablet Take 4 mg by mouth at bedtime.     rosuvastatin  (CRESTOR ) 10 MG tablet Take 10 mg by mouth at bedtime.     tirzepatide  (MOUNJARO ) 2.5 MG/0.5ML Pen Inject 2.5 mg into the skin once a week. 6 mL 3   traMADol  (ULTRAM ) 50 MG tablet Take 1 tablet (50 mg total) by mouth every 6 (six) hours as needed. 15 tablet 0   valACYclovir  (VALTREX ) 500 MG tablet Take 500 mg by mouth at bedtime.     vitamin B-12 (CYANOCOBALAMIN ) 100 MCG tablet Take 100 mcg by mouth every evening.     Continuous Glucose Sensor (DEXCOM G7 SENSOR) MISC Use to check glucose continuously, change sensor every 10 days 9 each 3   insulin  aspart (NOVOLOG  FLEXPEN) 100 UNIT/ML  FlexPen Take 25 units plus sliding scale with meals 3 times a day, maximum 75 units/day. 45 mL 4   insulin  glargine, 2 Unit Dial , (TOUJEO  MAX SOLOSTAR) 300 UNIT/ML Solostar Pen Inject 50 Units into the skin daily. 18 mL 3   No current facility-administered medications for this visit.    PHYSICAL EXAM: Vitals:   04/13/24 1322  BP: 138/80  Pulse: 100  Resp: 20  SpO2: 99%  Weight: 195 lb (88.5 kg)  Height: 1' (0.305 m)    Body mass index is 952.08 kg/m.  Wt Readings from Last 3 Encounters:  04/13/24 195 lb (88.5 kg)  03/06/24 202 lb 9.6 oz (91.9 kg)  01/03/24 199 lb 9.6 oz (90.5 kg)    General: Well developed, well nourished female in no apparent distress.  HEENT: AT/[place], no external lesions.  Eyes: Conjunctiva clear and no icterus. Neck: Neck supple  Lungs: Respirations not labored Neurologic: Alert, oriented, normal speech Extremities / Skin: Dry.   Psychiatric: Does  not appear depressed or anxious  Diabetic Foot Exam - Simple   No data filed     LABS Reviewed Lab Results  Component Value Date   HGBA1C 10.2 (A) 04/13/2024   HGBA1C 10.4 (H) 03/07/2024   HGBA1C 10.0 (H) 12/08/2023   No results found for: FRUCTOSAMINE Lab Results  Component Value Date   CHOL 164 03/07/2024   HDL 49 03/07/2024   LDLCALC 90 03/07/2024   LDLDIRECT 100.0 01/07/2015   TRIG 125 03/07/2024   CHOLHDL 3.3 03/07/2024   No results found for: [facility] Lab Results  Component Value Date   CREATININE 0.84 03/06/2024   Lab Results  Component Value Date   GFR 67.09 04/26/2020    ASSESSMENT / PLAN  1. Uncontrolled type 2 diabetes mellitus with hyperglycemia (HCC)     Diabetes Mellitus type 2, complicated by no other known complications. - Diabetic status / severity: Uncontrolled.  Lab Results  Component Value Date   HGBA1C 10.2 (A) 04/13/2024    - Hemoglobin A1c goal : <6.5%  Discussed about type 2 diabetes mellitus and importance of controlling blood sugar to prevent potential chronic diabetic complications including diabetic retinopathy, neuropathy and nephropathy.  Patient has mostly hyperglycemia.  Discussed about importance of controlling blood sugar.  Discussed about controlling and limiting carbohydrates.  Discussed about compliance with insulin  regimen.  Patient continued to have significant hyperglycemia.  She reports compliance with current insulin  regimen.  Increased insulin  doses significantly as follows.  - Medications: See below.  Increase Toujeo  from 30 units to 50 units daily in the morning. Increase NovoLog  from 20 to 25 units with meals 3 times daily, plus sliding scale as follows.  Moderate Sliding Scale Blood Glucose        Insulin  60-150                     None 151-200                   3 units 201-250                   5 units 251-300                   7 units 301-350                   9 units 351-400                   11 units    >  400                       12 units and call provider  Advised to take Humalog  5 units for snacks.  Start Mounjaro  2.5 mg weekly.  Please stay on the lower dose of Mounjaro  for now.  Start Jardiance  25 mg daily.  - Home glucose testing: CGM and check as needed.  Dexcom G7.  - Discussed/ Gave Hypoglycemia treatment plan.  # Consult : not required at this time.   # Annual urine for microalbuminuria/ creatinine ratio, no microalbuminuria currently.  Will check today. Last No results found for: MICRALBCREAT  # Foot check nightly.  # Annual dilated diabetic eye exams.   - Diet: Make healthy diabetic food choices - Life style / activity / exercise: Discussed.  2. Blood pressure  -  BP Readings from Last 1 Encounters:  04/13/24 138/80    - Control is in target.  - No change in current plans.  3. Lipid status / Hyperlipidemia - Last  Lab Results  Component Value Date   LDLCALC 90 03/07/2024   - Continue atorvastatin, managed by primary care provider.  # Primary hypothyroidism -Current on levothyroxine , managed by primary care provider.  Diagnoses and all orders for this visit:  Uncontrolled type 2 diabetes mellitus with hyperglycemia (HCC) -     POCT glycosylated hemoglobin (Hb A1C) -     tirzepatide  (MOUNJARO ) 2.5 MG/0.5ML Pen; Inject 2.5 mg into the skin once a week. -     Microalbumin / creatinine urine ratio -     insulin  glargine, 2 Unit Dial , (TOUJEO  MAX SOLOSTAR) 300 UNIT/ML Solostar Pen; Inject 50 Units into the skin daily. -     insulin  aspart (NOVOLOG  FLEXPEN) 100 UNIT/ML FlexPen; Take 25 units plus sliding scale with meals 3 times a day, maximum 75 units/day. -     Insulin  Pen Needle 32G X 4 MM MISC; Use 4x a day -     empagliflozin  (JARDIANCE ) 25 MG TABS tablet; Take 1 tablet (25 mg total) by mouth daily before breakfast. -     Continuous Glucose Sensor (DEXCOM G7 SENSOR) MISC; Use to check glucose continuously, change sensor every 10  days    DISPOSITION Follow up in clinic in 2 months suggested.   All questions answered and patient verbalized understanding of the plan.  [place]ra[place] Luticia Tadros, MD [facility] Endocrinology [facility] [address] [place], [address] [place], [place] [address] Phone # [phone]  At least part of this note was generated using voice recognition software. Inadvertent word errors may have occurred, which were not recognized during the proofreading process.

## 2024-04-14 LAB — MICROALBUMIN / CREATININE URINE RATIO
Creatinine, Urine: 77 mg/dL (ref 20–275)
Microalb, Ur: <0.2 mg/dL

## 2024-04-17 DIAGNOSIS — I10 Essential (primary) hypertension: Secondary | ICD-10-CM | POA: Diagnosis not present

## 2024-04-17 DIAGNOSIS — E1165 Type 2 diabetes mellitus with hyperglycemia: Secondary | ICD-10-CM | POA: Diagnosis not present

## 2024-04-17 DIAGNOSIS — N1831 Chronic kidney disease, stage 3a: Secondary | ICD-10-CM | POA: Diagnosis not present

## 2024-04-17 DIAGNOSIS — E1122 Type 2 diabetes mellitus with diabetic chronic kidney disease: Secondary | ICD-10-CM | POA: Diagnosis not present

## 2024-04-20 ENCOUNTER — Encounter (INDEPENDENT_AMBULATORY_CARE_PROVIDER_SITE_OTHER): Payer: Self-pay | Admitting: Otolaryngology

## 2024-04-20 ENCOUNTER — Ambulatory Visit (INDEPENDENT_AMBULATORY_CARE_PROVIDER_SITE_OTHER): Admitting: Otolaryngology

## 2024-04-20 VITALS — BP 103/69 | HR 99 | Wt 190.0 lb

## 2024-04-20 DIAGNOSIS — Z91198 Patient's noncompliance with other medical treatment and regimen for other reason: Secondary | ICD-10-CM | POA: Diagnosis not present

## 2024-04-20 DIAGNOSIS — Z789 Other specified health status: Secondary | ICD-10-CM

## 2024-04-20 DIAGNOSIS — G4733 Obstructive sleep apnea (adult) (pediatric): Secondary | ICD-10-CM | POA: Diagnosis not present

## 2024-04-20 NOTE — Progress Notes (Signed)
 Dear Dr. Dyane, Here is my assessment for our mutual patient, [name]. Thank you for allowing me the opportunity to care for your patient. Please do not hesitate to contact me should you have any other questions. Sincerely, Dr. Eldora Blanch  Otolaryngology Clinic Note Referring provider: Dr. Dyane HPI:  [name] is a 59 y.o. female kindly referred by Dr. Dyane for evaluation of OSA and CPAP intolerance Diagnosed with OSA in 2022.  Initial visit (11/2023):  Using CPAP: not using it currently because of intolerance; reports she pulls it off in her Foster Interventions: Nasal pillow - does not work, tried oral appliance and failed Cannot tolerate currently due to pulling mask off and cannot tolerate it Insomnia: no Chest surgery: no Denies dysphonia, dysphagia Workup so far: Home Foster study, Pulmonary and Foster Eval  Does have RLS - on ropinorole; does not interfere with Foster  --------------------------------------------------------- 04/20/2024 Seen in follow up. Did have repeat Foster study and we discussed results. She wishes to continue to proceed with hypoglossal nerve stim since she does not use CPAP and cannot tolerate it. She is now on ASA 81 due to the TIA and another episode in July 2025 (perhaps Migraine?). She is waiting to have better control of her sugars prior to replacement of spinal cord stim  pulse generator. Of note, she does have an unusual sleeping pattnern for sleeping 24 hours several times per month with low ESS.  No sedating medication use.    H&N Surgery: Tonsillectomy, ACDF (left side - 2011) Personal or FHx of bleeding dz or anesthesia difficulty: no  GLP-1: Mounjaro  AP/AC: ASA 81  Tobacco: quit. Lives in [place], [place]  PMHx: Sarcoidosis and Asthma, Depression, Fibromyalgia, Ankylosing Spondylitis, RA, Hypothyroidism, Chronic pain (prior on dilaudid ), T2DM on insulin , TIA, h/o seizures, Migraine, HTN, Spinal Cord stim, HLD  Independent Review  of Additional Tests or Records:  Dr. Shellia (Pulm) 11/17/2023: noted asthma and sarcoidosis, some sinus congestion; has OSA with trouble keeping CPAP on; tried oral appliance but did not tolerate; Dx: OSA; Rx: CPAP and follow up for Inspire Spirometry independently reviewed (11/17/2023): FEV 2.64, FEV1% 84 [name]) 11/04/2023 notes reviewed in media tab: CPAP for 6 months, can't adjust; nasal pillows, mild sleepiness while driving; Dx: CPAP intolerance, OSA; Rx: ef ENT Foster study reviewed (02/05/2023): AHI 23.6, Central apnea index: 6.9    [facility] Foster study (02/02/2024) interpreted: AHI 15.2 (3% was 21.1), Ow < 88% was <62min; no PLM; Central index: 0 PMH/Meds/All/SocHx/FamHx/ROS:   Past Medical History:  Diagnosis Date   Ankylosing spondylitis (HCC)    Anxiety    Arthritis    back, hands, neck (09/18/2014)   Asthma    Atypical chest pain    Chronic back pain    whole spine   Chronic bronchitis (HCC)    get it close to q yr (09/18/2014)   Chronic pain    Complicated migraine    Depression    Environmental allergies    Erosion of suburethral sling (HCC)    Fibromyalgia    Frequency of urination    GERD (gastroesophageal reflux disease)    Headache    at least several times/wk (09/18/2014)   Heart murmur    when I was a baby   Herpes simplex type 2 infection    History of angina    HLA B27 positive    Hypertension    Hypothyroidism    IBS (irritable bowel syndrome)    IC (interstitial cystitis)  Lesion of frontal lobe of brain    Migraine    maybe once/month (09/18/2014)   Nocturia    NSVD (normal spontaneous vaginal delivery)    x3   Osteoarthritis    Pneumonia    RLS (restless legs syndrome)    Sarcoid    Seizures (HCC)    when I was a teenager   Shortness of breath dyspnea    Exertion   SUI (stress urinary incontinence, female)    Thyroid  disease      Past Surgical History:  Procedure Laterality Date   ABDOMINAL HYSTERECTOMY   2006   ANTERIOR CERVICAL DECOMP/DISCECTOMY FUSION  2011   APPENDECTOMY  1985   ARTHROPLASTY     Left thumb   BACK SURGERY  2008   X 2 in 2010   BACK SURGERY     CESAREAN SECTION  2000   CRANIECTOMY FOR DEPRESSED SKULL FRACTURE Left 12/05/2014   Procedure: Left frontal stereotactic craniectomy for biopsy of skull lesion;  Surgeon: Gerldine Maizes, MD;  Location: [facility];  Service: Neurosurgery;  Laterality: Left;  Left frontal stereotactic craniectomy for biopsy of skull lesion   CYSTO WITH HYDRODISTENSION N/A 11/30/2012   Procedure: CYSTOSCOPY/HYDRODISTENSION;  Surgeon: Alm GORMAN Fragmin, MD;  Location: [facility];  Service: Urology;  Laterality: N/A;   KNEE ARTHROSCOPY Right    LUMBAR DISC SURGERY  02-02-2007   RIGHT SIDE L5 -- S1   LUMBAR FUSION  12-04-2008   L4  -- S1   LYNX RETROPUBIC SUBURETHRAL SLING  03-02-2007   MASS EXCISION Left 03/19/2017   Procedure: EXCISION LEFT WRIST VOLAR MASS;  Surgeon: Sissy Cough, MD;  Location: [facility];  Service: Orthopedics;  Laterality: Left;   MEDIASTINOSCOPY N/A 10/26/2014   Procedure: MEDIASTINOSCOPY;  Surgeon: Elspeth JAYSON Millers, MD;  Location: [facility] OR;  Service: Thoracic;  Laterality: N/A;   NECK SURGERY     PELVIC LAPAROSCOPY  1990's   LYSIS ADHESIONS   PUBOVAGINAL SLING N/A 11/30/2012   Procedure: CARLOYN GLADE;  Surgeon: Alm GORMAN Fragmin, MD;  Location: [facility];  Service: Urology;  Laterality: N/A;  1 HR EXAM UNDER ANESTHESIA, EXCISION OF SUB URETHRAL MESH, CYSTO, HOD    SPINAL CORD STIMULATOR IMPLANT     TONSILLECTOMY  1986   TUBAL LIGATION  2001   hulka clip   VIDEO BRONCHOSCOPY WITH ENDOBRONCHIAL ULTRASOUND N/A 09/21/2014   Procedure: VIDEO BRONCHOSCOPY WITH ENDOBRONCHIAL ULTRASOUND;  Surgeon: Lamar GORMAN Chris, MD;  Location: [facility];  Service: Thoracic;  Laterality: N/A;    Family History  Problem Relation Age of Onset   Diabetes Mother    Hypertension Mother    Heart disease Mother     Cancer Father        LUNG AND BRAIN   Hypertension Father    Heart disease Maternal Grandmother    Cancer Maternal Grandfather        LUNG   Heart disease Paternal Grandmother    Cancer Paternal Grandfather      Social Connections: Moderately Integrated (10/25/2023)   Received from [facility]   Social Network    How would you rate your social network (family, work, friends)?: Adequate participation with social networks      Current Outpatient Medications:    albuterol  (VENTOLIN  HFA) 108 (90 Base) MCG/ACT inhaler, Inhale 2 puffs into the lungs every 6 (six) hours as needed for wheezing or shortness of breath., Disp: 8 g, Rfl: 6   aspirin  EC 81 MG tablet,  Take 1 tablet (81 mg total) by mouth daily. Swallow whole., Disp: 30 tablet, Rfl: 12   butalbital -acetaminophen -caffeine  (FIORICET ) 50-325-40 MG tablet, Take 1 tablet by mouth every 6 (six) hours as needed for headache or migraine., Disp: 14 tablet, Rfl: 0   celecoxib (CELEBREX) 200 MG capsule, Take 200 mg by mouth at bedtime., Disp: , Rfl:    Cholecalciferol (VITAMIN D3) 1.25 MG (50000 UT) CAPS, Take 50,000 Units by mouth once a week. Thursday, Disp: , Rfl:    Continuous Glucose Receiver (DEXCOM G7 RECEIVER) DEVI, 1 Device by Does not apply route continuous. Change sensor every 10 days, Disp: 2 each, Rfl: 1   Continuous Glucose Sensor (DEXCOM G7 SENSOR) MISC, Use to check glucose continuously, change sensor every 10 days, Disp: 9 each, Rfl: 3   empagliflozin  (JARDIANCE ) 25 MG TABS tablet, Take 1 tablet (25 mg total) by mouth daily before breakfast., Disp: 90 tablet, Rfl: 3   fluticasone  furoate-vilanterol (BREO ELLIPTA ) 100-25 MCG/ACT AEPB, Inhale 1 puff into the lungs daily., Disp: 30 each, Rfl: 5   gabapentin  (NEURONTIN ) 100 MG capsule, Take 200 mg by mouth at bedtime., Disp: , Rfl:    hydrochlorothiazide (HYDRODIURIL) 12.5 MG tablet, Take 12.5 mg by mouth every morning., Disp: , Rfl:    insulin  aspart (NOVOLOG  FLEXPEN) 100 UNIT/ML  FlexPen, Take 25 units plus sliding scale with meals 3 times a day, maximum 75 units/day., Disp: 45 mL, Rfl: 4   insulin  glargine, 2 Unit Dial , (TOUJEO  MAX SOLOSTAR) 300 UNIT/ML Solostar Pen, Inject 50 Units into the skin daily., Disp: 18 mL, Rfl: 3   Insulin  Pen Needle 32G X 4 MM MISC, Use 4x a day, Disp: 300 each, Rfl: 3   levothyroxine  (SYNTHROID , LEVOTHROID) 137 MCG tablet, Take 137 mcg by mouth daily before breakfast., Disp: , Rfl:    lipase/protease/amylase (CREON) 12000-38000 units CPEP capsule, Take 1-2 capsules by mouth See admin instructions. Take two capsules by mouth  with each meal and 1 capsule by mouth with a snack per patient, Disp: , Rfl:    losartan (COZAAR) 100 MG tablet, Take 100 mg by mouth daily., Disp: , Rfl:    naloxone (NARCAN) 2 MG/2ML injection, Inject 0.4 mg into the skin daily as needed (for overdose)., Disp: , Rfl:    omeprazole (PRILOSEC) 40 MG capsule, Take 80 mg by mouth every evening., Disp: , Rfl:    ondansetron  (ZOFRAN ) 4 MG tablet, Take 1 tablet (4 mg total) by mouth every 8 (eight) hours as needed for nausea or vomiting., Disp: 20 tablet, Rfl: 0   PARoxetine  (PAXIL ) 40 MG tablet, Take 40 mg by mouth at bedtime., Disp: , Rfl: 3   promethazine  (PHENERGAN ) 25 MG tablet, Take 1 tablet by mouth daily as needed for nausea/vomiting., Disp: , Rfl:    rOPINIRole  (REQUIP ) 2 MG tablet, Take 4 mg by mouth at bedtime., Disp: , Rfl:    rosuvastatin  (CRESTOR ) 10 MG tablet, Take 10 mg by mouth at bedtime., Disp: , Rfl:    tirzepatide  (MOUNJARO ) 2.5 MG/0.5ML Pen, Inject 2.5 mg into the skin once a week., Disp: 6 mL, Rfl: 3   traMADol  (ULTRAM ) 50 MG tablet, Take 1 tablet (50 mg total) by mouth every 6 (six) hours as needed., Disp: 15 tablet, Rfl: 0   valACYclovir  (VALTREX ) 500 MG tablet, Take 500 mg by mouth at bedtime., Disp: , Rfl:    vitamin B-12 (CYANOCOBALAMIN ) 100 MCG tablet, Take 100 mcg by mouth every evening., Disp: , Rfl:    Physical Exam:  BP 103/69 (BP Location:  Left Arm, Patient Position: Sitting, Cuff Size: Large)   Pulse 99   Wt 190 lb (86.2 kg)   LMP 06/03/2012   SpO2 95%   BMI 927.67 kg/m   Salient findings:  CN II-XII intact  Bilateral EAC clear and TM intact with well pneumatized middle ear spaces Anterior rhinoscopy: Septum relatively midline; bilateral inferior turbinates with mild hypertrophy No lesions of oral cavity/oropharynx; dentition fair; tonsils absent; Friedman tongue 2 No obviously palpable neck masses/lymphadenopathy/thyromegaly; well healed LEFT neck incision No respiratory distress or stridor  Seprately Identifiable Procedures:  Procedure Note (prior, not today) Pre-procedure diagnosis:  Obstructive Foster apnea, rule out structural cause Post-procedure diagnosis: Same Procedure: Transnasal Fiberoptic Laryngoscopy, CPT 68424 - Mod 25 Indication: see above Complications: None apparent EBL: 0 mL  The procedure was undertaken to further evaluate complaint above, with mirror exam inadequate for appropriate examination due to gag reflex and poor patient tolerance  Procedure:  Patient was identified as correct patient. Verbal consent was obtained. The nose was sprayed with oxymetazoline and 4% lidocaine . The The flexible laryngoscope was passed through the nose to view the nasal cavity, pharynx (oropharynx, hypopharynx) and larynx.  The larynx was examined at rest and during multiple phonatory tasks. Documentation was obtained and reviewed with patient. The scope was removed. The patient tolerated the procedure well.  Findings: The nasal cavity and nasopharynx did not reveal any masses or lesions, mucosa appeared to be without obvious lesions. The tongue base, pharyngeal walls, piriform sinuses, vallecula, epiglottis and postcricoid region are normal in appearance; Muller maneuver negative. The visualized portion of the subglottis and proximal trachea is widely patent. The vocal folds are mobile bilaterally. There are no lesions  on the free edge of the vocal folds nor elsewhere in the larynx worrisome for malignancy.    Electronically signed by: Eldora KATHEE Blanch, MD 04/22/2024 10:56 AM   Impression & Plans:  [name] is a 58 y.o. female with:  1. OSA (obstructive Foster apnea)   2. Intolerance of continuous positive airway pressure (CPAP) ventilation    After repeat Foster study, AHI 15.2 with essentially no central Foster index. She has had possibly two TIAs in last few months with poor glucose control. We discussed options including continued CPAP v/s oral appliance v/s Inspire. Tonsils absent. Currently she is a candidate. After long discussion, she would prefer to go forward with CN 12 stim rather than trial oral appliance. Cannot tolerate CPAP. Reasonable as do think treating her OSA is important in light of her recent TIA  We had a discussion regarding risks of Inspire including lack of benefit, persistent symptoms, pneumothorax, tongue soreness or weakness, Floor of mouth numbness, injury to major vessels, hematoma, implant infection, bleeding, scarring, tethering of neck, persistent symptoms, need for further procedures, and risk of anesthesia among others.   She would like to proceed   - schedule for DISE; then submit for approval if she qualifies  Needs clearance from Dr. Shellia and Dr. Clemencia Hint [facility] [place]) Stop mounjaro  1 week pre-op; stop ASA 81 1 week prior to surgery and resume 1 week after  See below regarding exact medications prescribed this encounter including dosages and route: No orders of the defined types were placed in this encounter.   Thank you for allowing me the opportunity to care for your patient. Please do not hesitate to contact me should you have any other questions.  Sincerely, Eldora Blanch, MD Otolaryngologist (ENT), [facility] Phone:  [phone] Fax: [phone]  04/22/2024, 10:56 AM   MDM:  Level 4: 99214 Complexity/Problems addressed:  mod Data complexity: mod - review of notes, independent test interpretation - Morbidity: mod - decision for surgery - Prescription Drug prescribed or managed: no

## 2024-04-21 ENCOUNTER — Telehealth (INDEPENDENT_AMBULATORY_CARE_PROVIDER_SITE_OTHER): Payer: Self-pay

## 2024-04-21 NOTE — Telephone Encounter (Signed)
-----   Message from [name] sent at 04/20/2024 11:51 AM EDT ----- Regarding: pre-op clearance Hey, this lady needs pre-op clearance for inspire from Dr. Carolynne Allan (pulmonary), and Dr. Anthony Hint ([facility]). Needs to stop mounjaro  1 week prior to surger yand stop aspirin  81mg  1 week prior and resume it 1 week after surgery Thanks

## 2024-04-21 NOTE — Telephone Encounter (Signed)
 Pre Op faxed to PCP and Pulm 04/21/2024.

## 2024-04-27 ENCOUNTER — Telehealth (INDEPENDENT_AMBULATORY_CARE_PROVIDER_SITE_OTHER): Payer: Self-pay

## 2024-04-27 NOTE — Telephone Encounter (Signed)
 Spoke to patient informing her of the clearance received from her PCP. Patient is to stop Mounjaro  and Aspirin  1 week before surgery and start them again one week after surgery. Patient is to take Insulin  the morning of surgery. Patient verbalizes understanding.

## 2024-04-30 DIAGNOSIS — E039 Hypothyroidism, unspecified: Secondary | ICD-10-CM | POA: Diagnosis not present

## 2024-04-30 DIAGNOSIS — E1165 Type 2 diabetes mellitus with hyperglycemia: Secondary | ICD-10-CM | POA: Diagnosis not present

## 2024-04-30 DIAGNOSIS — E1122 Type 2 diabetes mellitus with diabetic chronic kidney disease: Secondary | ICD-10-CM | POA: Diagnosis not present

## 2024-04-30 DIAGNOSIS — N1831 Chronic kidney disease, stage 3a: Secondary | ICD-10-CM | POA: Diagnosis not present

## 2024-04-30 DIAGNOSIS — I10 Essential (primary) hypertension: Secondary | ICD-10-CM | POA: Diagnosis not present

## 2024-05-04 ENCOUNTER — Emergency Department (HOSPITAL_BASED_OUTPATIENT_CLINIC_OR_DEPARTMENT_OTHER): Admitting: Radiology

## 2024-05-04 ENCOUNTER — Other Ambulatory Visit: Payer: Self-pay

## 2024-05-04 ENCOUNTER — Emergency Department (HOSPITAL_BASED_OUTPATIENT_CLINIC_OR_DEPARTMENT_OTHER)

## 2024-05-04 ENCOUNTER — Observation Stay (HOSPITAL_BASED_OUTPATIENT_CLINIC_OR_DEPARTMENT_OTHER)
Admission: EM | Admit: 2024-05-04 | Discharge: 2024-05-07 | Disposition: A | Discharge status: Home / Self Care | Attending: Internal Medicine | Admitting: Internal Medicine

## 2024-05-04 DIAGNOSIS — Z794 Long term (current) use of insulin: Secondary | ICD-10-CM | POA: Diagnosis not present

## 2024-05-04 DIAGNOSIS — G2581 Restless legs syndrome: Secondary | ICD-10-CM | POA: Diagnosis not present

## 2024-05-04 DIAGNOSIS — I2510 Atherosclerotic heart disease of native coronary artery without angina pectoris: Secondary | ICD-10-CM | POA: Diagnosis not present

## 2024-05-04 DIAGNOSIS — G4730 Sleep apnea, unspecified: Secondary | ICD-10-CM | POA: Insufficient documentation

## 2024-05-04 DIAGNOSIS — R0600 Dyspnea, unspecified: Secondary | ICD-10-CM | POA: Diagnosis not present

## 2024-05-04 DIAGNOSIS — I959 Hypotension, unspecified: Secondary | ICD-10-CM | POA: Diagnosis not present

## 2024-05-04 DIAGNOSIS — Z7982 Long term (current) use of aspirin: Secondary | ICD-10-CM | POA: Diagnosis not present

## 2024-05-04 DIAGNOSIS — J45909 Unspecified asthma, uncomplicated: Secondary | ICD-10-CM | POA: Diagnosis not present

## 2024-05-04 DIAGNOSIS — G8929 Other chronic pain: Secondary | ICD-10-CM | POA: Diagnosis present

## 2024-05-04 DIAGNOSIS — Z79899 Other long term (current) drug therapy: Secondary | ICD-10-CM | POA: Insufficient documentation

## 2024-05-04 DIAGNOSIS — M459 Ankylosing spondylitis of unspecified sites in spine: Secondary | ICD-10-CM | POA: Insufficient documentation

## 2024-05-04 DIAGNOSIS — F32A Depression, unspecified: Secondary | ICD-10-CM | POA: Diagnosis present

## 2024-05-04 DIAGNOSIS — D869 Sarcoidosis, unspecified: Secondary | ICD-10-CM | POA: Diagnosis not present

## 2024-05-04 DIAGNOSIS — R079 Chest pain, unspecified: Secondary | ICD-10-CM | POA: Diagnosis not present

## 2024-05-04 DIAGNOSIS — Z8669 Personal history of other diseases of the nervous system and sense organs: Secondary | ICD-10-CM | POA: Insufficient documentation

## 2024-05-04 DIAGNOSIS — E785 Hyperlipidemia, unspecified: Secondary | ICD-10-CM | POA: Diagnosis not present

## 2024-05-04 DIAGNOSIS — R590 Localized enlarged lymph nodes: Secondary | ICD-10-CM | POA: Diagnosis not present

## 2024-05-04 DIAGNOSIS — N179 Acute kidney failure, unspecified: Secondary | ICD-10-CM | POA: Insufficient documentation

## 2024-05-04 DIAGNOSIS — R55 Syncope and collapse: Secondary | ICD-10-CM | POA: Diagnosis present

## 2024-05-04 DIAGNOSIS — I10 Essential (primary) hypertension: Secondary | ICD-10-CM | POA: Diagnosis not present

## 2024-05-04 DIAGNOSIS — E1165 Type 2 diabetes mellitus with hyperglycemia: Secondary | ICD-10-CM | POA: Diagnosis not present

## 2024-05-04 DIAGNOSIS — K219 Gastro-esophageal reflux disease without esophagitis: Secondary | ICD-10-CM | POA: Insufficient documentation

## 2024-05-04 DIAGNOSIS — R002 Palpitations: Secondary | ICD-10-CM | POA: Diagnosis present

## 2024-05-04 DIAGNOSIS — E871 Hypo-osmolality and hyponatremia: Secondary | ICD-10-CM | POA: Diagnosis not present

## 2024-05-04 DIAGNOSIS — E039 Hypothyroidism, unspecified: Secondary | ICD-10-CM | POA: Diagnosis not present

## 2024-05-04 DIAGNOSIS — Z87891 Personal history of nicotine dependence: Secondary | ICD-10-CM | POA: Insufficient documentation

## 2024-05-04 DIAGNOSIS — E119 Type 2 diabetes mellitus without complications: Secondary | ICD-10-CM

## 2024-05-04 LAB — URINALYSIS, ROUTINE W REFLEX MICROSCOPIC
Bacteria, UA: NONE SEEN
Bilirubin Urine: NEGATIVE
Glucose, UA: >1000 mg/dL — AB
Hgb urine dipstick: NEGATIVE
Ketones, ur: NEGATIVE mg/dL
Leukocytes,Ua: NEGATIVE
Nitrite: NEGATIVE
Protein, ur: NEGATIVE mg/dL
Specific Gravity, Urine: 1.008 (ref 1.005–1.030)
pH: 5.5 (ref 5.0–8.0)

## 2024-05-04 LAB — BASIC METABOLIC PANEL WITH GFR
Anion gap: 17 — ABNORMAL HIGH (ref 5–15)
BUN: 30 mg/dL — ABNORMAL HIGH (ref 6–20)
CO2: 16 mmol/L — ABNORMAL LOW (ref 22–32)
Calcium: 9.9 mg/dL (ref 8.9–10.3)
Chloride: 101 mmol/L (ref 98–111)
Creatinine, Ser: 1.2 mg/dL — ABNORMAL HIGH (ref 0.44–1.00)
GFR, Estimated: 53 mL/min — ABNORMAL LOW (ref >60)
Glucose, Bld: 166 mg/dL — ABNORMAL HIGH (ref 70–99)
Potassium: 3.6 mmol/L (ref 3.5–5.1)
Sodium: 134 mmol/L — ABNORMAL LOW (ref 135–145)

## 2024-05-04 LAB — CBC
HCT: 41.2 % (ref 36.0–46.0)
Hemoglobin: 14.5 g/dL (ref 12.0–15.0)
MCH: 28.9 pg (ref 26.0–34.0)
MCHC: 35.2 g/dL (ref 30.0–36.0)
MCV: 82.2 fL (ref 80.0–100.0)
Platelets: 341 K/uL (ref 150–400)
RBC: 5.01 MIL/uL (ref 3.87–5.11)
RDW: 13.7 % (ref 11.5–15.5)
WBC: 9.4 K/uL (ref 4.0–10.5)
nRBC: 0 % (ref 0.0–0.2)

## 2024-05-04 LAB — LACTIC ACID, PLASMA
Lactic Acid, Venous: 2.1 mmol/L (ref 0.5–1.9)
Lactic Acid, Venous: 1.3 mmol/L (ref 0.5–1.9)

## 2024-05-04 LAB — TROPONIN T, HIGH SENSITIVITY
Troponin T High Sensitivity: <15 ng/L (ref 0–19)
Troponin T High Sensitivity: <15 ng/L (ref 0–19)
Troponin T High Sensitivity: <15 ng/L (ref 0–19)

## 2024-05-04 LAB — D-DIMER, QUANTITATIVE: D-Dimer, Quant: <0.27 ug{FEU}/mL (ref 0.00–0.50)

## 2024-05-04 MED ORDER — SODIUM CHLORIDE 0.9 % IV BOLUS
1000.0000 mL | Freq: Once | INTRAVENOUS | Status: AC
  Administered 2024-05-04: 1000 mL via INTRAVENOUS

## 2024-05-04 MED ORDER — SODIUM CHLORIDE 0.9 % IV SOLN: INTRAVENOUS | Status: DC

## 2024-05-04 MED ORDER — IOHEXOL 350 MG/ML SOLN
75.0000 mL | Freq: Once | INTRAVENOUS | Status: AC | PRN
Start: 2024-05-04 — End: 2024-05-04
  Administered 2024-05-04: 75 mL via INTRAVENOUS

## 2024-05-04 NOTE — ED Provider Notes (Addendum)
 [place] EMERGENCY DEPARTMENT AT [facility] Provider Note   CSN: [id number] Arrival date & time: 05/04/24  1727     Patient presents with: Tachycardia   [name] is a 58 y.o. female.   Patient with onset of early this afternoon with some chest discomfort and shortness of breath felt like her heart was racing.  Patient said heart rate was in the 130s.  Upon arrival here her heart rate was in the 110 range.  Blood pressure 108/72 temp 97.8.  Respiratory rate up to around 20.  Oxygen sats were 99% room air.  Patient with significant past medical history to include chronic pain.  Has a spine stimulator in place.  Has a history of questionable seizures fibromyalgia sarcoidosis.  Patient admitted July 7 for dizziness headache questionable TIA.  Also admitted April 8 for left-sided weakness.  Review of the admission on July 7 discharged on July 9 was strokelike symptoms complicated migraine versus functional neurological disorder secondary diagnosis was hypertension hyperlipidemia type 2 diabetes migraine headaches remote history of seizures obesity anxiety depression.       Prior to Admission medications   Medication Sig Start Date End Date Taking? Authorizing Provider  albuterol  (VENTOLIN  HFA) 108 (90 Base) MCG/ACT inhaler Inhale 2 puffs into the lungs every 6 (six) hours as needed for wheezing or shortness of breath. 12/29/21   Sood, Vineet, MD  aspirin  EC 81 MG tablet Take 1 tablet (81 mg total) by mouth daily. Swallow whole. 12/10/23   Pokhrel, Laxman, MD  butalbital -acetaminophen -caffeine  (FIORICET ) 50-325-40 MG tablet Take 1 tablet by mouth every 6 (six) hours as needed for headache or migraine. 12/09/23   Pokhrel, Laxman, MD  celecoxib  (CELEBREX ) 200 MG capsule Take 200 mg by mouth at bedtime. 10/24/19   [provider]  Cholecalciferol (VITAMIN D3) 1.25 MG (50000 UT) CAPS Take 50,000 Units by mouth once a week. Thursday    [provider]  Continuous  Glucose Receiver (DEXCOM G7 RECEIVER) DEVI 1 Device by Does not apply route continuous. Change sensor every 10 days 02/28/24   Thapa, [place], MD  Continuous Glucose Sensor (DEXCOM G7 SENSOR) MISC Use to check glucose continuously, change sensor every 10 days 04/13/24   Thapa, [place], MD  empagliflozin  (JARDIANCE ) 25 MG TABS tablet Take 1 tablet (25 mg total) by mouth daily before breakfast. 04/13/24   Thapa, [place], MD  fluticasone  furoate-vilanterol (BREO ELLIPTA ) 100-25 MCG/ACT AEPB Inhale 1 puff into the lungs daily. 12/29/21   Sood, Vineet, MD  gabapentin  (NEURONTIN ) 100 MG capsule Take 200 mg by mouth at bedtime.    [provider]  hydrochlorothiazide (HYDRODIURIL) 12.5 MG tablet Take 12.5 mg by mouth every morning. 01/13/24   [provider]  insulin  aspart (NOVOLOG  FLEXPEN) 100 UNIT/ML FlexPen Take 25 units plus sliding scale with meals 3 times a day, maximum 75 units/day. 04/13/24   Thapa, [place], MD  insulin  glargine, 2 Unit Dial , (TOUJEO  MAX SOLOSTAR) 300 UNIT/ML Solostar Pen Inject 50 Units into the skin daily. 04/13/24   Thapa, [place], MD  Insulin  Pen Needle 32G X 4 MM MISC Use 4x a day 04/13/24   Thapa, [place], MD  levothyroxine  (SYNTHROID , LEVOTHROID) 137 MCG tablet Take 137 mcg by mouth daily before breakfast.    [provider]  lipase/protease/amylase (CREON ) 12000-38000 units CPEP capsule Take 1-2 capsules by mouth See admin instructions. Take two capsules by mouth  with each meal and 1 capsule by mouth with a snack per patient    [provider]  losartan (COZAAR) 100 MG tablet Take 100 mg by mouth daily.    [provider]  naloxone (NARCAN) 2 MG/2ML injection Inject 0.4 mg into the skin daily as needed (for overdose). 09/05/18   [provider]  omeprazole (PRILOSEC) 40 MG capsule Take 80 mg by mouth every evening.    [provider]  ondansetron  (ZOFRAN ) 4 MG tablet Take 1 tablet (4 mg total) by mouth every 8 (eight) hours as needed  for nausea or vomiting. 01/29/17   Trudy Earnie CROME, CNM  PARoxetine  (PAXIL ) 40 MG tablet Take 40 mg by mouth at bedtime. 11/29/15   [provider]  promethazine  (PHENERGAN ) 25 MG tablet Take 1 tablet by mouth daily as needed for nausea/vomiting. 01/18/20   [provider]  rOPINIRole  (REQUIP ) 2 MG tablet Take 4 mg by mouth at bedtime.    [provider]  rosuvastatin  (CRESTOR ) 10 MG tablet Take 10 mg by mouth at bedtime.    [provider]  tirzepatide  (MOUNJARO ) 2.5 MG/0.5ML Pen Inject 2.5 mg into the skin once a week. 04/13/24   Thapa, [place], MD  traMADol  (ULTRAM ) 50 MG tablet Take 1 tablet (50 mg total) by mouth every 6 (six) hours as needed. 11/03/22   Tegeler, Lonni PARAS, MD  valACYclovir  (VALTREX ) 500 MG tablet Take 500 mg by mouth at bedtime.    [provider]  vitamin B-12 (CYANOCOBALAMIN ) 100 MCG tablet Take 100 mcg by mouth every evening.    [provider]    Allergies: Lyrica [pregabalin], Topiramate , Humira [adalimumab], Infliximab , Percocet [oxycodone -acetaminophen ], Sulfamethoxazole-trimethoprim, and Vicodin [hydrocodone -acetaminophen ]    Review of Systems  Constitutional:  Negative for chills and fever.  HENT:  Negative for ear pain and sore throat.   Eyes:  Negative for pain and visual disturbance.  Respiratory:  Positive for shortness of breath. Negative for cough.   Cardiovascular:  Positive for chest pain and palpitations.  Gastrointestinal:  Negative for abdominal pain and vomiting.  Genitourinary:  Negative for dysuria and hematuria.  Musculoskeletal:  Negative for arthralgias and back pain.  Skin:  Negative for color change and rash.  Neurological:  Negative for seizures and syncope.  All other systems reviewed and are negative.   Updated Vital Signs BP (!) 90/55   Pulse 84   Temp 97.8 F (36.6 C)   Resp 12   Ht 1.676 m (5' 6)   Wt 86.2 kg   LMP 06/03/2012   SpO2 100%   BMI 30.67 kg/m   Physical  Exam Vitals and nursing note reviewed.  Constitutional:      General: She is not in acute distress.    Appearance: Normal appearance. She is well-developed.  HENT:     Head: Normocephalic and atraumatic.     Mouth/Throat:     Mouth: Mucous membranes are moist.  Eyes:     Conjunctiva/sclera: Conjunctivae normal.     Pupils: Pupils are equal, round, and reactive to light.  Cardiovascular:     Rate and Rhythm: Regular rhythm. Tachycardia present.     Heart sounds: No murmur heard. Pulmonary:     Effort: Pulmonary effort is normal. No respiratory distress.     Breath sounds: No wheezing or rhonchi.  Abdominal:     Palpations: Abdomen is soft.     Tenderness: There is no abdominal tenderness.  Musculoskeletal:        General: No swelling.     Cervical back: Normal range of motion and neck supple.  Right lower leg: No edema.     Left lower leg: No edema.  Skin:    General: Skin is warm and dry.     Capillary Refill: Capillary refill takes less than 2 seconds.  Neurological:     General: No focal deficit present.     Mental Status: She is alert and oriented to person, place, and time.  Psychiatric:        Mood and Affect: Mood normal.     (all labs ordered are listed, but only abnormal results are displayed) Labs Reviewed  BASIC METABOLIC PANEL WITH GFR - Abnormal; Notable for the following components:      Result Value   Sodium 134 (*)    CO2 16 (*)    Glucose, Bld 166 (*)    BUN 30 (*)    Creatinine, Ser 1.20 (*)    GFR, Estimated 53 (*)    Anion gap 17 (*)    All other components within normal limits  CULTURE, BLOOD (ROUTINE X 2)  CULTURE, BLOOD (ROUTINE X 2)  CBC  URINALYSIS, ROUTINE W REFLEX MICROSCOPIC  D-DIMER, QUANTITATIVE  LACTIC ACID, PLASMA  LACTIC ACID, PLASMA  TROPONIN T, HIGH SENSITIVITY  TROPONIN T, HIGH SENSITIVITY    EKG: EKG Interpretation Date/Time:  Thursday May 04 2024 17:35:21 EDT Ventricular Rate:  115 PR Interval:  148 QRS  Duration:  92 QT Interval:  344 QTC Calculation: 475 R Axis:   53  Text Interpretation: Sinus tachycardia Otherwise normal ECG When compared with ECG of 06-Mar-2024 20:25, No significant change was found Confirmed by Etai Copado [phone]) on 05/04/2024 5:46:34 PM  Radiology: [facility] Chest 2 View Result Date: 05/04/2024 CLINICAL DATA:  Chest pain EXAM: CHEST - 2 VIEW COMPARISON:  Chest x-ray 12/07/2023 FINDINGS: The heart size and mediastinal contours are within normal limits. Both lungs are clear. Cervical spinal fusion plate present. Thoracic spinal cord stimulator device present. IMPRESSION: No active cardiopulmonary disease. Electronically Signed   By: Greig Pique M.D.   On: 05/04/2024 18:21     Procedures   Medications Ordered in the ED  0.9 %  sodium chloride  infusion (has no administration in time range)  sodium chloride  0.9 % bolus 1,000 mL (1,000 mLs Intravenous New Bag/Given 05/04/24 1936)                                    Medical Decision Making Amount and/or Complexity of Data Reviewed Labs: ordered. Radiology: ordered.  Risk Prescription drug management. Decision regarding hospitalization.   Patient's initial labs sodium 134 glucose 166 GFR 53 creatinine 1.2.  White count was normal hemoglobin 14.5 platelets of 341.  Initial troponin less than 15.  Will add on D-dimer.  2 view chest without any acute findings.  Sounds as if patient had a syncopal episode.  And has been hypotensive since that she is awake and alert and will follow commands but blood pressures are in the 70s.  Patient receiving 1 L normal saline.  Still raises some concerns about pulmonary embolus.  Patient's renal function is technically good enough to do CT angio.  We would need to improve her blood pressures.  Oxygen sats are fine.  It was 97%.  Will put her on a couple liters because of the event.  She was a little tachycardic when she first came in and anywhere from 0100-0135.  Now she is down into the  80s.  Still normal rhythm.  Did asked to do repeat EKG.  CRITICAL CARE Performed by: Josefina Rynders Total critical care time: 45 minutes Critical care time was exclusive of separately billable procedures and treating other patients. Critical care was necessary to treat or prevent imminent or life-threatening deterioration. Critical care was time spent personally by me on the following activities: development of treatment plan with patient and/or surrogate as well as nursing, discussions with consultants, evaluation of patient's response to treatment, examination of patient, obtaining history from patient or surrogate, ordering and performing treatments and interventions, ordering and review of laboratory studies, ordering and review of radiographic studies, pulse oximetry and re-evaluation of patient's condition.  Repeat EKG tachycardia is resolved heart rate is around 81 otherwise no significant changes.  Will get D-dimer will give IV fluids last blood pressure was 90 will see if we can get her stabilized with the blood pressure and will get her over CT angio.  If not we will rely on the D-dimer.  Second troponin is due now.  Pressure as stated has now stabilized.  Will get her over to CT angio to rule out pulmonary embolus.  Repeat troponin was less than 15.  And lactic acid was 1.3.  So significantly improved from the first 1.  CT angio without any acute findings.  No evidence of any pulmonary embolism.  No acute intra or thoracic pathology.  Do not have a good explanation for patient's syncopal and hypotensive event that seem to last a little bit longer than we do expect with vasovagal.  Will contact hospitalist for overnight admission and cardiac monitoring.  Patient's vital signs are very stable now.  Patient has no complaints.     Final diagnoses:  Hypotension, unspecified hypotension type  Syncope, unspecified syncope type    ED Discharge Orders     None           Geraldene Hamilton, MD 05/04/24 LEAFY    Geraldene Hamilton, MD 05/04/24 [phone]

## 2024-05-04 NOTE — ED Triage Notes (Signed)
 Pt reports HR has been elevated all day, reports HR has been 130s resting at home. Endorses some CP and SHOB. Denies cardiac or respiratory hx.

## 2024-05-04 NOTE — ED Notes (Addendum)
 Pt's fiance at bedside reports pt started to loose consciousness. Pt was unresponsive momentarily and sweating profusely. BP decreased to 65/46. IV placed and NS bolus started. ED at bedside. Pt now responsive and A&Ox4. Pt placed on 2 L of oxygen per MD orders.

## 2024-05-04 NOTE — ED Notes (Signed)
 Patient transported to CT

## 2024-05-05 ENCOUNTER — Encounter (HOSPITAL_COMMUNITY): Payer: Self-pay | Admitting: Internal Medicine

## 2024-05-05 DIAGNOSIS — G4730 Sleep apnea, unspecified: Secondary | ICD-10-CM | POA: Diagnosis not present

## 2024-05-05 DIAGNOSIS — Z743 Need for continuous supervision: Secondary | ICD-10-CM | POA: Diagnosis not present

## 2024-05-05 DIAGNOSIS — E119 Type 2 diabetes mellitus without complications: Secondary | ICD-10-CM

## 2024-05-05 DIAGNOSIS — Z794 Long term (current) use of insulin: Secondary | ICD-10-CM | POA: Diagnosis not present

## 2024-05-05 DIAGNOSIS — R55 Syncope and collapse: Secondary | ICD-10-CM

## 2024-05-05 DIAGNOSIS — M459 Ankylosing spondylitis of unspecified sites in spine: Secondary | ICD-10-CM | POA: Insufficient documentation

## 2024-05-05 DIAGNOSIS — D869 Sarcoidosis, unspecified: Secondary | ICD-10-CM | POA: Diagnosis not present

## 2024-05-05 DIAGNOSIS — I10 Essential (primary) hypertension: Secondary | ICD-10-CM

## 2024-05-05 DIAGNOSIS — Z79899 Other long term (current) drug therapy: Secondary | ICD-10-CM | POA: Diagnosis not present

## 2024-05-05 DIAGNOSIS — E785 Hyperlipidemia, unspecified: Secondary | ICD-10-CM | POA: Diagnosis not present

## 2024-05-05 DIAGNOSIS — G8929 Other chronic pain: Secondary | ICD-10-CM | POA: Diagnosis not present

## 2024-05-05 DIAGNOSIS — I959 Hypotension, unspecified: Secondary | ICD-10-CM | POA: Diagnosis present

## 2024-05-05 DIAGNOSIS — N179 Acute kidney failure, unspecified: Secondary | ICD-10-CM | POA: Diagnosis not present

## 2024-05-05 DIAGNOSIS — Z7982 Long term (current) use of aspirin: Secondary | ICD-10-CM | POA: Diagnosis not present

## 2024-05-05 DIAGNOSIS — K219 Gastro-esophageal reflux disease without esophagitis: Secondary | ICD-10-CM | POA: Diagnosis not present

## 2024-05-05 DIAGNOSIS — J45909 Unspecified asthma, uncomplicated: Secondary | ICD-10-CM | POA: Diagnosis not present

## 2024-05-05 DIAGNOSIS — E039 Hypothyroidism, unspecified: Secondary | ICD-10-CM | POA: Diagnosis not present

## 2024-05-05 LAB — I-STAT VENOUS BLOOD GAS, ED
Acid-base deficit: 9 mmol/L — ABNORMAL HIGH (ref 0.0–2.0)
Bicarbonate: 16 mmol/L — ABNORMAL LOW (ref 20.0–28.0)
Calcium, Ion: 1.18 mmol/L (ref 1.15–1.40)
HCT: 34 % — ABNORMAL LOW (ref 36.0–46.0)
Hemoglobin: 11.6 g/dL — ABNORMAL LOW (ref 12.0–15.0)
O2 Saturation: 79 %
Patient temperature: 98.5
Potassium: 3.6 mmol/L (ref 3.5–5.1)
Sodium: 140 mmol/L (ref 135–145)
TCO2: 17 mmol/L — ABNORMAL LOW (ref 22–32)
pCO2, Ven: 30.4 mmHg — ABNORMAL LOW (ref 44–60)
pH, Ven: 7.329 (ref 7.25–7.43)
pO2, Ven: 46 mmHg — ABNORMAL HIGH (ref 32–45)

## 2024-05-05 LAB — CBC WITH DIFFERENTIAL/PLATELET
Abs Immature Granulocytes: 0.04 K/uL (ref 0.00–0.07)
Basophils Absolute: 0.1 K/uL (ref 0.0–0.1)
Basophils Relative: 1 %
Eosinophils Absolute: 0.3 K/uL (ref 0.0–0.5)
Eosinophils Relative: 3 %
HCT: 36.7 % (ref 36.0–46.0)
Hemoglobin: 12.8 g/dL (ref 12.0–15.0)
Immature Granulocytes: 1 %
Lymphocytes Relative: 28 %
Lymphs Abs: 2.5 K/uL (ref 0.7–4.0)
MCH: 29.1 pg (ref 26.0–34.0)
MCHC: 34.9 g/dL (ref 30.0–36.0)
MCV: 83.4 fL (ref 80.0–100.0)
Monocytes Absolute: 1 K/uL (ref 0.1–1.0)
Monocytes Relative: 11 %
Neutro Abs: 5 K/uL (ref 1.7–7.7)
Neutrophils Relative %: 56 %
Platelets: 281 K/uL (ref 150–400)
RBC: 4.4 MIL/uL (ref 3.87–5.11)
RDW: 13.6 % (ref 11.5–15.5)
WBC: 8.8 K/uL (ref 4.0–10.5)
nRBC: 0 % (ref 0.0–0.2)

## 2024-05-05 LAB — BASIC METABOLIC PANEL WITH GFR
Anion gap: 10 (ref 5–15)
BUN: 20 mg/dL (ref 6–20)
CO2: 21 mmol/L — ABNORMAL LOW (ref 22–32)
Calcium: 8.6 mg/dL — ABNORMAL LOW (ref 8.9–10.3)
Chloride: 108 mmol/L (ref 98–111)
Creatinine, Ser: 1.08 mg/dL — ABNORMAL HIGH (ref 0.44–1.00)
GFR, Estimated: 60 mL/min — ABNORMAL LOW (ref >60)
Glucose, Bld: 121 mg/dL — ABNORMAL HIGH (ref 70–99)
Potassium: 3.6 mmol/L (ref 3.5–5.1)
Sodium: 139 mmol/L (ref 135–145)

## 2024-05-05 LAB — GLUCOSE, CAPILLARY
Glucose-Capillary: 139 mg/dL — ABNORMAL HIGH (ref 70–99)
Glucose-Capillary: 141 mg/dL — ABNORMAL HIGH (ref 70–99)
Glucose-Capillary: 141 mg/dL — ABNORMAL HIGH (ref 70–99)
Glucose-Capillary: 168 mg/dL — ABNORMAL HIGH (ref 70–99)

## 2024-05-05 LAB — HEPATIC FUNCTION PANEL
ALT: 28 U/L (ref 0–44)
AST: 24 U/L (ref 15–41)
Albumin: 3.7 g/dL (ref 3.5–5.0)
Alkaline Phosphatase: 75 U/L (ref 38–126)
Bilirubin, Direct: 0.1 mg/dL (ref 0.0–0.2)
Indirect Bilirubin: 0.7 mg/dL (ref 0.3–0.9)
Total Bilirubin: 0.8 mg/dL (ref 0.0–1.2)
Total Protein: 6.5 g/dL (ref 6.5–8.1)

## 2024-05-05 LAB — TSH: TSH: 12.854 u[IU]/mL — ABNORMAL HIGH (ref 0.350–4.500)

## 2024-05-05 LAB — T4, FREE: Free T4: 0.99 ng/dL (ref 0.61–1.12)

## 2024-05-05 LAB — TROPONIN I (HIGH SENSITIVITY): Troponin I (High Sensitivity): 4 ng/L (ref <18)

## 2024-05-05 MED ORDER — ACETAMINOPHEN 650 MG RE SUPP: 650.0000 mg | Freq: Four times a day (QID) | RECTAL | Status: DC | PRN

## 2024-05-05 MED ORDER — PANTOPRAZOLE SODIUM 40 MG PO TBEC
40.0000 mg | DELAYED_RELEASE_TABLET | Freq: Every day | ORAL | Status: DC
  Administered 2024-05-06 – 2024-05-07 (×2): 40 mg via ORAL
  Filled 2024-05-05 (×2): qty 1

## 2024-05-05 MED ORDER — ASPIRIN 81 MG PO TBEC
81.0000 mg | DELAYED_RELEASE_TABLET | Freq: Every day | ORAL | Status: DC
  Administered 2024-05-05 – 2024-05-07 (×3): 81 mg via ORAL
  Filled 2024-05-05 (×3): qty 1

## 2024-05-05 MED ORDER — INSULIN GLARGINE 100 UNIT/ML ~~LOC~~ SOLN
50.0000 [IU] | Freq: Every day | SUBCUTANEOUS | Status: DC
  Administered 2024-05-06 – 2024-05-07 (×2): 50 [IU] via SUBCUTANEOUS
  Filled 2024-05-05 (×2): qty 0.5

## 2024-05-05 MED ORDER — LEVOTHYROXINE SODIUM 25 MCG PO TABS
137.0000 ug | ORAL_TABLET | Freq: Every day | ORAL | Status: DC
  Administered 2024-05-06 – 2024-05-07 (×2): 137 ug via ORAL
  Filled 2024-05-05 (×2): qty 1

## 2024-05-05 MED ORDER — INSULIN GLARGINE (2 UNIT DIAL) 300 UNIT/ML ~~LOC~~ SOPN: 50.0000 [IU] | PEN_INJECTOR | Freq: Every day | SUBCUTANEOUS | Status: DC

## 2024-05-05 MED ORDER — HYDROMORPHONE HCL 1 MG/ML IJ SOLN
0.5000 mg | INTRAMUSCULAR | Status: DC | PRN
  Administered 2024-05-05 – 2024-05-07 (×6): 0.5 mg via INTRAVENOUS
  Filled 2024-05-05 (×6): qty 1

## 2024-05-05 MED ORDER — ACETAMINOPHEN 325 MG PO TABS
650.0000 mg | ORAL_TABLET | Freq: Four times a day (QID) | ORAL | Status: DC | PRN
  Administered 2024-05-05 – 2024-05-07 (×2): 650 mg via ORAL
  Filled 2024-05-05 (×2): qty 2

## 2024-05-05 MED ORDER — PAROXETINE HCL 20 MG PO TABS
40.0000 mg | ORAL_TABLET | Freq: Every day | ORAL | Status: DC
  Administered 2024-05-05 – 2024-05-06 (×2): 40 mg via ORAL
  Filled 2024-05-05 (×3): qty 2

## 2024-05-05 MED ORDER — EMPAGLIFLOZIN 25 MG PO TABS
25.0000 mg | ORAL_TABLET | Freq: Every day | ORAL | Status: DC
  Administered 2024-05-06 – 2024-05-07 (×2): 25 mg via ORAL
  Filled 2024-05-05 (×2): qty 1

## 2024-05-05 MED ORDER — FLUTICASONE FUROATE-VILANTEROL 100-25 MCG/ACT IN AEPB
1.0000 | INHALATION_SPRAY | Freq: Every day | RESPIRATORY_TRACT | Status: DC
  Administered 2024-05-06: 1 via RESPIRATORY_TRACT
  Filled 2024-05-05: qty 28

## 2024-05-05 MED ORDER — CELECOXIB 200 MG PO CAPS
200.0000 mg | ORAL_CAPSULE | Freq: Every day | ORAL | Status: DC
  Administered 2024-05-05 – 2024-05-06 (×2): 200 mg via ORAL
  Filled 2024-05-05 (×3): qty 1

## 2024-05-05 MED ORDER — INSULIN ASPART 100 UNIT/ML IJ SOLN
0.0000 [IU] | Freq: Three times a day (TID) | INTRAMUSCULAR | Status: DC
  Administered 2024-05-05 (×3): 1 [IU] via SUBCUTANEOUS
  Administered 2024-05-06: 2 [IU] via SUBCUTANEOUS
  Administered 2024-05-06 – 2024-05-07 (×4): 1 [IU] via SUBCUTANEOUS

## 2024-05-05 MED ORDER — ENOXAPARIN SODIUM 40 MG/0.4ML IJ SOSY
40.0000 mg | PREFILLED_SYRINGE | INTRAMUSCULAR | Status: DC
  Administered 2024-05-05 – 2024-05-07 (×3): 40 mg via SUBCUTANEOUS
  Filled 2024-05-05 (×3): qty 0.4

## 2024-05-05 MED ORDER — PANCRELIPASE (LIP-PROT-AMYL) 12000-38000 UNITS PO CPEP
24000.0000 [IU] | ORAL_CAPSULE | Freq: Three times a day (TID) | ORAL | Status: DC
  Administered 2024-05-05 – 2024-05-07 (×4): 24000 [IU] via ORAL
  Filled 2024-05-05 (×7): qty 2

## 2024-05-05 MED ORDER — VITAMIN B-12 100 MCG PO TABS
100.0000 ug | ORAL_TABLET | Freq: Every evening | ORAL | Status: DC
  Administered 2024-05-05 – 2024-05-07 (×2): 100 ug via ORAL
  Filled 2024-05-05 (×3): qty 1

## 2024-05-05 MED ORDER — VALACYCLOVIR HCL 500 MG PO TABS
500.0000 mg | ORAL_TABLET | Freq: Every day | ORAL | Status: DC
  Administered 2024-05-05 – 2024-05-06 (×2): 500 mg via ORAL
  Filled 2024-05-05 (×3): qty 1

## 2024-05-05 MED ORDER — ATOGEPANT 60 MG PO TABS: 1.0000 | ORAL_TABLET | Freq: Every day | ORAL | Status: DC

## 2024-05-05 MED ORDER — ALBUTEROL SULFATE HFA 108 (90 BASE) MCG/ACT IN AERS: 2.0000 | INHALATION_SPRAY | Freq: Four times a day (QID) | RESPIRATORY_TRACT | Status: DC | PRN

## 2024-05-05 MED ORDER — GABAPENTIN 100 MG PO CAPS
200.0000 mg | ORAL_CAPSULE | Freq: Every day | ORAL | Status: DC
  Administered 2024-05-05 – 2024-05-06 (×2): 200 mg via ORAL
  Filled 2024-05-05 (×2): qty 2

## 2024-05-05 MED ORDER — LACTATED RINGERS IV SOLN
INTRAVENOUS | Status: AC
  Administered 2024-05-05: 1000 mL via INTRAVENOUS

## 2024-05-05 MED ORDER — ROSUVASTATIN CALCIUM 5 MG PO TABS
10.0000 mg | ORAL_TABLET | Freq: Every day | ORAL | Status: DC
  Administered 2024-05-05 – 2024-05-06 (×2): 10 mg via ORAL
  Filled 2024-05-05 (×2): qty 2

## 2024-05-05 MED ORDER — ROPINIROLE HCL 1 MG PO TABS
4.0000 mg | ORAL_TABLET | Freq: Every day | ORAL | Status: DC
  Administered 2024-05-05 – 2024-05-06 (×2): 4 mg via ORAL
  Filled 2024-05-05 (×2): qty 4

## 2024-05-05 MED ORDER — ALBUTEROL SULFATE (2.5 MG/3ML) 0.083% IN NEBU: 2.5000 mg | INHALATION_SOLUTION | Freq: Four times a day (QID) | RESPIRATORY_TRACT | Status: DC | PRN

## 2024-05-05 MED ORDER — ROPINIROLE HCL 1 MG PO TABS: 4.0000 mg | ORAL_TABLET | Freq: Every day | ORAL | Status: DC

## 2024-05-05 MED ORDER — INSULIN ASPART 100 UNIT/ML FLEXPEN: 25.0000 [IU] | PEN_INJECTOR | Freq: Three times a day (TID) | SUBCUTANEOUS | Status: DC

## 2024-05-05 NOTE — Plan of Care (Signed)
   Problem: Activity: Goal: Risk for activity intolerance will decrease Outcome: Progressing   Problem: Nutrition: Goal: Adequate nutrition will be maintained Outcome: Progressing   Problem: Coping: Goal: Level of anxiety will decrease Outcome: Progressing

## 2024-05-05 NOTE — Care Management Obs Status (Signed)
 MEDICARE OBSERVATION STATUS NOTIFICATION   Patient Details  Name: [name] MRN: [id number] Date of Birth: 06/13/1966   Medicare Observation Status Notification Given:  Yes    [name] 05/05/2024, 8:51 AM

## 2024-05-05 NOTE — TOC CM/SW Note (Signed)
 Transition of Care [facility]) - Inpatient Brief Assessment   Patient Details  Name: [name] MRN: [id number] Date of Birth: 11/21/1965  Transition of [facility]) CM/SW Contact:    Waddell Barnie Rama, RN Phone Number: 05/05/2024, 2:38 PM   Clinical Narrative: From home with fiance and son, has PCP and insurance on file, states has no HH services in place at this time , has walker and w/chair  at home.  States family member will transport them home at [employer] and family is support system, states gets medications from [facility] in [place].  Pta self ambulatory with walker. Per PT/OT eval rec outpatient PT,  Patient states she is ok with going to outpatient rehab on [facility].  NCM made referral thru epic for her.    Transition of Care Asessment: Insurance and Status: Insurance coverage has been reviewed Patient has primary care physician: Yes Home environment has been reviewed: home with fiance and son Prior level of function:: ambulatory with walker Prior/Current Home Services: Current home services (walker, w/chair)   Readmission risk has been reviewed: Yes Transition of care needs: transition of care needs identified, TOC will continue to follow

## 2024-05-05 NOTE — Evaluation (Signed)
 Physical Therapy Evaluation Patient Details Name: [name] MRN: [id number] DOB: March 09, 1966 Today's Date: 05/05/2024  History of Present Illness  58 yo female adm 05/04/24 with tachycardia with syncope in ED. PMHx: chronic back pain, cervical post laminectomy syndrome with spinal stimulator, lumbar fusion, HTN, migraine, asthma, hypothyroidism, depression, skull fx, left sided weakness, T2DM, sarcoidosis  Clinical Impression  Pt pleasant and reports feeling better but having progressive feeling of SOB with activity for the last few months. Pt with posterior LOB with gait and educated for use of DME , pt reports being apprehensive so she doesn't appear old but more aware of fall risk end of session and states willingness to use rollator. Pt with SPO2 93-99% throughout gait on RA despite SOB with increased hall ambulation and stairs with HR 94-98. Pt without symptoms of dizziness or lightheadedness. Pt with decreased balance and functional mobility who can normally walk 15 min at a time and was fatigued from this session. She will benefit from acute and OPPT to maximize balance and independence. Encouraged gait with RW acutely.   Supine 120/75 (87) Sitting 120/84 (94) Standing 110/91 (98) Post walk 129/80 (97)      If plan is discharge home, recommend the following: Assistance with cooking/housework   Can travel by private vehicle        Equipment Recommendations None recommended by PT  Recommendations for Other Services       Functional Status Assessment Patient has had a recent decline in their functional status and/or demonstrates limited ability to make significant improvements in function in a reasonable and predictable amount of time     Precautions / Restrictions Precautions Precautions: Fall Recall of Precautions/Restrictions: Intact      Mobility  Bed Mobility Overal bed mobility: Modified Independent                  Transfers Overall transfer level:  Modified independent                      Ambulation/Gait Ambulation/Gait assistance: Contact guard assist Gait Distance (Feet): 400 Feet Assistive device: IV Pole Gait Pattern/deviations: Step-through pattern, Decreased stride length   Gait velocity interpretation: 1.31 - 2.62 ft/sec, indicative of limited community ambulator   General Gait Details: pt with frequent posterior LOB with gait with pt reaching out for environment to recover even with posterior tactile cues and guarding, transitioned to pt holding IV pole with CGA with improvement and pt educated for DME to prevent LOB and improve safety.  Stairs Stairs: Yes Stairs assistance: Modified independent (Device/Increase time) Stair Management: Alternating pattern, One rail Right, Forwards Number of Stairs: 4    Wheelchair Mobility     Tilt Bed    Modified Rankin (Stroke Patients Only)       Balance Overall balance assessment: Needs assistance Sitting-balance support: No upper extremity supported, Feet supported Sitting balance-Leahy Scale: Good       Standing balance-Leahy Scale: Fair Standing balance comment: static standing without support, CGA and tactile cues with gait vs UB support                             Pertinent Vitals/Pain Pain Assessment Pain Assessment: No/denies pain    Home Living Family/patient expects to be discharged to:: Private residence Living Arrangements: Spouse/significant other;Children Available Help at Discharge: Family;Available PRN/intermittently Type of Home: House Home Access: Stairs to enter Entrance Stairs-Rails: Can reach both;Right;Left Entrance Stairs-Number of  Steps: 3   Home Layout: One level Home Equipment: Grab bars - tub/shower;Grab bars - toilet;Shower seat - built Charity fundraiser (2 wheels);Scientist, research (medical) (4 wheels) Additional Comments: enjoys crafting    Prior Function Prior Level of Function : Independent/Modified  Independent;Driving             Mobility Comments: increasing LOB with gait and grabs for environmental support frequently ADLs Comments: cares for her chickens     Extremity/Trunk Assessment   Upper Extremity Assessment Upper Extremity Assessment: Overall WFL for tasks assessed    Lower Extremity Assessment Lower Extremity Assessment: Overall WFL for tasks assessed    Cervical / Trunk Assessment Cervical / Trunk Assessment: Normal  Communication   Communication Communication: No apparent difficulties    Cognition Arousal: Alert Behavior During Therapy: WFL for tasks assessed/performed   PT - Cognitive impairments: Safety/Judgement                       PT - Cognition Comments: pt able to state progressive loss of balance but resistant to use of DME Following commands: Intact       Cueing Cueing Techniques: Verbal cues     General Comments      Exercises     Assessment/Plan    PT Assessment Patient needs continued PT services  PT Problem List Decreased balance;Decreased activity tolerance;Decreased mobility;Decreased knowledge of use of DME;Decreased safety awareness       PT Treatment Interventions DME instruction;Balance training;Gait training;Stair training;Functional mobility training;Therapeutic activities;Therapeutic exercise;Patient/family education;Neuromuscular re-education    PT Goals (Current goals can be found in the Care Plan section)  Acute Rehab PT Goals Patient Stated Goal: return to crafting and taking care of my chickens PT Goal Formulation: With patient Time For Goal Achievement: 05/19/24 Potential to Achieve Goals: Good    Frequency Min 1X/week     Co-evaluation               AM-PAC PT 6 Clicks Mobility  Outcome Measure Help needed turning from your back to your side while in a flat bed without using bedrails?: None Help needed moving from lying on your back to sitting on the side of a flat bed without using  bedrails?: None Help needed moving to and from a bed to a chair (including a wheelchair)?: None Help needed standing up from a chair using your arms (e.g., wheelchair or bedside chair)?: None Help needed to walk in hospital room?: A Little Help needed climbing 3-5 steps with a railing? : A Little 6 Click Score: 22    End of Session   Activity Tolerance: Patient tolerated treatment well Patient left: in bed;with call bell/phone within reach (denied OOB to chair due to RLS) Nurse Communication: Mobility status PT Visit Diagnosis: Other abnormalities of gait and mobility (R26.89);Difficulty in walking, not elsewhere classified (R26.2)    Time: 9044-8980 PT Time Calculation (min) (ACUTE ONLY): 24 min   Charges:   PT Evaluation $PT Eval Moderate Complexity: 1 Mod PT Treatments $Gait Training: 8-22 mins PT General Charges $$ ACUTE PT VISIT: 1 Visit         Lenoard SQUIBB, PT Acute Rehabilitation Services Office: [phone]   Lenoard NOVAK Wetzel Meester 05/05/2024, 10:43 AM

## 2024-05-05 NOTE — H&P (Signed)
 History and Physical    [name] FMW:[id number] DOB: 1965-11-22 DOA: 05/04/2024  Patient coming from: Home.  Chief Complaint: Palpitations.  HPI: [name] is a 58 y.o. female with history of hypertension, diabetes mellitus type 2, hypothyroidism, migraine, ankylosing spondylitis, sarcoidosis, sleep apnea, asthma, pancreatic exocrine insufficiency who was recently admitted in July 1 week for strokelike symptoms when patient received TNK presents to the ER after patient was having persistent tachycardia with some chest pressure over the last 24 hours.  Patient states she has been recently started back on her Mounjaro  3 weeks ago.  Denies any abdominal pain nausea vomiting.  ED Course: In the ER patient was tachycardic.  CT angiogram did not show any acute findings.  While in the ER patient suddenly felt weak and passed out for about 2 or 3 minutes.  At that time patient's blood pressure was in the systolic 80s.  Was given fluid bolus and blood pressure improved.  Labs show creatinine of 1.2 sodium 134 troponins were negative D-dimer was unremarkable.  At the time of my exam patient appears nonfocal.  Feeling better.  Review of Systems: As per HPI, rest all negative.   Past Medical History:  Diagnosis Date   Ankylosing spondylitis (HCC)    Anxiety    Arthritis    back, hands, neck (09/18/2014)   Asthma    Atypical chest pain    Chronic back pain    whole spine   Chronic bronchitis (HCC)    get it close to q yr (09/18/2014)   Chronic pain    Complicated migraine    Depression    Environmental allergies    Erosion of suburethral sling (HCC)    Fibromyalgia    Frequency of urination    GERD (gastroesophageal reflux disease)    Headache    at least several times/wk (09/18/2014)   Heart murmur    when I was a baby   Herpes simplex type 2 infection    History of angina    HLA B27 positive    Hypertension    Hypothyroidism    IBS (irritable bowel  syndrome)    IC (interstitial cystitis)    Lesion of frontal lobe of brain    Migraine    maybe once/month (09/18/2014)   Nocturia    NSVD (normal spontaneous vaginal delivery)    x3   Osteoarthritis    Pneumonia    RLS (restless legs syndrome)    Sarcoid    Seizures (HCC)    when I was a teenager   Shortness of breath dyspnea    Exertion   SUI (stress urinary incontinence, female)    Thyroid  disease     Past Surgical History:  Procedure Laterality Date   ABDOMINAL HYSTERECTOMY  2006   ANTERIOR CERVICAL DECOMP/DISCECTOMY FUSION  2011   APPENDECTOMY  1985   ARTHROPLASTY     Left thumb   BACK SURGERY  2008   X 2 in 2010   BACK SURGERY     CESAREAN SECTION  2000   CRANIECTOMY FOR DEPRESSED SKULL FRACTURE Left 12/05/2014   Procedure: Left frontal stereotactic craniectomy for biopsy of skull lesion;  Surgeon: [name];  Location: [facility];  Service: Neurosurgery;  Laterality: Left;  Left frontal stereotactic craniectomy for biopsy of skull lesion   CYSTO WITH HYDRODISTENSION N/A 11/30/2012   Procedure: CYSTOSCOPY/HYDRODISTENSION;  Surgeon: [name];  Location: [facility];  Service: Urology;  Laterality: N/A;  KNEE ARTHROSCOPY Right    LUMBAR DISC SURGERY  02-02-2007   RIGHT SIDE L5 -- S1   LUMBAR FUSION  12-04-2008   L4  -- S1   LYNX RETROPUBIC SUBURETHRAL SLING  03-02-2007   MASS EXCISION Left 03/19/2017   Procedure: EXCISION LEFT WRIST VOLAR MASS;  Surgeon: Sissy Cough, Foster;  Location: [facility] OR;  Service: Orthopedics;  Laterality: Left;   MEDIASTINOSCOPY N/A 10/26/2014   Procedure: MEDIASTINOSCOPY;  Surgeon: [name];  Location: [facility] OR;  Service: Thoracic;  Laterality: N/A;   NECK SURGERY     PELVIC LAPAROSCOPY  1990's   LYSIS ADHESIONS   PUBOVAGINAL SLING N/A 11/30/2012   Procedure: CARLOYN GLADE;  Surgeon: [name];  Location[facility];  Service: Urology;  Laterality: N/A;  1  HR EXAM UNDER ANESTHESIA, EXCISION OF SUB URETHRAL MESH, CYSTO, HOD    SPINAL CORD STIMULATOR IMPLANT     TONSILLECTOMY  1986   TUBAL LIGATION  2001   hulka clip   VIDEO BRONCHOSCOPY WITH ENDOBRONCHIAL ULTRASOUND N/A 09/21/2014   Procedure: VIDEO BRONCHOSCOPY WITH ENDOBRONCHIAL ULTRASOUND;  Surgeon: [name];  Location: [facility] OR;  Service: Thoracic;  Laterality: N/A;     reports that she quit smoking about 10 years ago. Her smoking use included cigarettes. She started smoking about 25 years ago. She has a 7.5 pack-year smoking history. She has never used smokeless tobacco. She reports that she does not currently use alcohol. She reports that she does not use drugs.  Allergies  Allergen Reactions   Lyrica [Pregabalin] Anaphylaxis   Topiramate  Other (See Comments)    Pt had hallucinations   Humira [Adalimumab] Other (See Comments)    Muscle weakness   Infliximab  Hives, Itching, Rash and Other (See Comments)     infusion reaction   Percocet [Oxycodone -Acetaminophen ] Itching   Sulfamethoxazole-Trimethoprim Other (See Comments)    Fever     Vicodin [Hydrocodone -Acetaminophen ] Itching    Family History  Problem Relation Age of Onset   Diabetes Mother    Hypertension Mother    Heart disease Mother    Cancer Father        LUNG AND BRAIN   Hypertension Father    Heart disease Maternal Grandmother    Cancer Maternal Grandfather        LUNG   Heart disease Paternal Grandmother    Cancer Paternal Grandfather     Prior to Admission medications   Medication Sig Start Date End Date Taking? Authorizing Provider  albuterol  (VENTOLIN  HFA) 108 (90 Base) MCG/ACT inhaler Inhale 2 puffs into the lungs every 6 (six) hours as needed for wheezing or shortness of breath. 12/29/21   Sood, Vineet, Foster  aspirin  EC 81 MG tablet Take 1 tablet (81 mg total) by mouth daily. Swallow whole. 12/10/23   Pokhrel, Laxman, Foster  butalbital -acetaminophen -caffeine  (FIORICET ) 50-325-40 MG tablet Take 1 tablet  by mouth every 6 (six) hours as needed for headache or migraine. 12/09/23   Pokhrel, Laxman, Foster  celecoxib  (CELEBREX ) 200 MG capsule Take 200 mg by mouth at bedtime. 10/24/19   Provider, Historical, Foster  Cholecalciferol (VITAMIN D3) 1.25 MG (50000 UT) CAPS Take 50,000 Units by mouth once a week. Thursday    Provider, Historical, Foster  Continuous Glucose Receiver (DEXCOM G7 RECEIVER) DEVI 1 Device by Does not apply route continuous. Change sensor every 10 days 02/28/24   Thapa, [place], Foster  Continuous Glucose Sensor (DEXCOM G7 SENSOR) MISC Use to check glucose continuously, change  sensor every 10 days 04/13/24   Thapa, [place], Foster  empagliflozin  (JARDIANCE ) 25 MG TABS tablet Take 1 tablet (25 mg total) by mouth daily before breakfast. 04/13/24   Thapa, [place], Foster  fluticasone  furoate-vilanterol (BREO ELLIPTA ) 100-25 MCG/ACT AEPB Inhale 1 puff into the lungs daily. 12/29/21   Sood, Vineet, Foster  gabapentin  (NEURONTIN ) 100 MG capsule Take 200 mg by mouth at bedtime.    Provider, Historical, Foster  hydrochlorothiazide (HYDRODIURIL) 12.5 MG tablet Take 12.5 mg by mouth every morning. 01/13/24   Provider, Historical, Foster  insulin  aspart (NOVOLOG  FLEXPEN) 100 UNIT/ML FlexPen Take 25 units plus sliding scale with meals 3 times a day, maximum 75 units/day. 04/13/24   Thapa, [place], Foster  insulin  glargine, 2 Unit Dial , (TOUJEO  MAX SOLOSTAR) 300 UNIT/ML Solostar Pen Inject 50 Units into the skin daily. 04/13/24   Thapa, [place], Foster  Insulin  Pen Needle 32G X 4 MM MISC Use 4x a day 04/13/24   Thapa, [place], Foster  levothyroxine  (SYNTHROID , LEVOTHROID) 137 MCG tablet Take 137 mcg by mouth daily before breakfast.    Provider, Historical, Foster  lipase/protease/amylase (CREON ) 12000-38000 units CPEP capsule Take 1-2 capsules by mouth See admin instructions. Take two capsules by mouth  with each meal and 1 capsule by mouth with a snack per patient    Provider, Historical, Foster  losartan (COZAAR) 100 MG tablet Take 100 mg by mouth daily.    Provider,  Historical, Foster  naloxone (NARCAN) 2 MG/2ML injection Inject 0.4 mg into the skin daily as needed (for overdose). 09/05/18   Provider, Historical, Foster  omeprazole (PRILOSEC) 40 MG capsule Take 80 mg by mouth every evening.    Provider, Historical, Foster  ondansetron  (ZOFRAN ) 4 MG tablet Take 1 tablet (4 mg total) by mouth every 8 (eight) hours as needed for nausea or vomiting. 01/29/17   Trudy Earnie CROME, CNM  PARoxetine  (PAXIL ) 40 MG tablet Take 40 mg by mouth at bedtime. 11/29/15   Provider, Historical, Foster  promethazine  (PHENERGAN ) 25 MG tablet Take 1 tablet by mouth daily as needed for nausea/vomiting. 01/18/20   Provider, Historical, Foster  rOPINIRole  (REQUIP ) 2 MG tablet Take 4 mg by mouth at bedtime.    Provider, Historical, Foster  rosuvastatin  (CRESTOR ) 10 MG tablet Take 10 mg by mouth at bedtime.    Provider, Historical, Foster  tirzepatide  (MOUNJARO ) 2.5 MG/0.5ML Pen Inject 2.5 mg into the skin once a week. 04/13/24   Thapa, [place], Foster  traMADol  (ULTRAM ) 50 MG tablet Take 1 tablet (50 mg total) by mouth every 6 (six) hours as needed. 11/03/22   Tegeler, Lonni PARAS, Foster  valACYclovir  (VALTREX ) 500 MG tablet Take 500 mg by mouth at bedtime.    Provider, Historical, Foster  vitamin B-12 (CYANOCOBALAMIN ) 100 MCG tablet Take 100 mcg by mouth every evening.    Provider, Historical, Foster    Physical Exam: Constitutional: Moderately built and nourished. Vitals:   05/05/24 0030 05/05/24 0100 05/05/24 0146 05/05/24 0551  BP: 111/81 107/74 123/75 123/78  Pulse: 84 86 84 82  Resp: 19 18 18 19   Temp:   98.8 F (37.1 C) 98.2 F (36.8 C)  TempSrc:   Oral Oral  SpO2: 97% 100% 95% 99%  Weight:   86.4 kg   Height:       Eyes: Anicteric no pallor. ENMT: No discharge from the ears eyes nose or mouth. Neck: No mass felt.  No neck rigidity. Respiratory: No rhonchi or crepitations. Cardiovascular: S1-S2 heard. Abdomen: Soft nontender bowel sound present.  Musculoskeletal: No edema. Skin: No rash. Neurologic: Alert awake  oriented time place and person.  Moves all extremities. Psychiatric: Appears normal.  Normal affect.   Labs on Admission: I have personally reviewed following labs and imaging studies  CBC: Recent Labs  Lab 05/04/24 1737 05/05/24 0011  WBC 9.4  --   HGB 14.5 11.6*  HCT 41.2 34.0*  MCV 82.2  --   PLT 341  --    Basic Metabolic Panel: Recent Labs  Lab 05/04/24 1737 05/05/24 0011  NA 134* 140  K 3.6 3.6  CL 101  --   CO2 16*  --   GLUCOSE 166*  --   BUN 30*  --   CREATININE 1.20*  --   CALCIUM  9.9  --    GFR: Estimated Creatinine Clearance: 57.2 mL/min (A) (by C-G formula based on SCr of 1.2 mg/dL (H)). Liver Function Tests: No results for input(s): AST, ALT, ALKPHOS, BILITOT, PROT, ALBUMIN in the last 168 hours. No results for input(s): LIPASE, AMYLASE in the last 168 hours. No results for input(s): AMMONIA in the last 168 hours. Coagulation Profile: No results for input(s): INR, PROTIME in the last 168 hours. Cardiac Enzymes: No results for input(s): CKTOTAL, CKMB, CKMBINDEX, TROPONINI in the last 168 hours. BNP (last 3 results) No results for input(s): PROBNP in the last 8760 hours. HbA1C: No results for input(s): HGBA1C in the last 72 hours. CBG: Recent Labs  Lab 05/05/24 0602  GLUCAP 139*   Lipid Profile: No results for input(s): CHOL, HDL, LDLCALC, TRIG, CHOLHDL, LDLDIRECT in the last 72 hours. Thyroid  Function Tests: No results for input(s): TSH, T4TOTAL, FREET4, T3FREE, THYROIDAB in the last 72 hours. Anemia Panel: No results for input(s): VITAMINB12, FOLATE, FERRITIN, TIBC, IRON, RETICCTPCT in the last 72 hours. Urine analysis:    Component Value Date/Time   COLORURINE COLORLESS (A) 05/04/2024 2019   APPEARANCEUR CLEAR 05/04/2024 2019   LABSPEC 1.008 05/04/2024 2019   PHURINE 5.5 05/04/2024 2019   GLUCOSEU >1,000 (A) 05/04/2024 2019   HGBUR NEGATIVE 05/04/2024 2019   BILIRUBINUR  NEGATIVE 05/04/2024 2019   KETONESUR NEGATIVE 05/04/2024 2019   PROTEINUR NEGATIVE 05/04/2024 2019   UROBILINOGEN 0.2 11/14/2014 0625   NITRITE NEGATIVE 05/04/2024 2019   LEUKOCYTESUR NEGATIVE 05/04/2024 2019   Sepsis Labs: @LABRCNTIP (procalcitonin:4,lacticidven:4) )No results found for this or any previous visit (from the past 240 hours).   Radiological Exams on Admission: CT Angio Chest PE W/Cm &/Or Wo Cm Result Date: 05/04/2024 CLINICAL DATA:  Tachycardia, high probability pulmonary embolism, chest pain, dyspnea EXAM: CT ANGIOGRAPHY CHEST WITH CONTRAST TECHNIQUE: Multidetector CT imaging of the chest was performed using the standard protocol during bolus administration of intravenous contrast. Multiplanar CT image reconstructions and MIPs were obtained to evaluate the vascular anatomy. RADIATION DOSE REDUCTION: This exam was performed according to the departmental dose-optimization program which includes automated exposure control, adjustment of the mA and/or kV according to patient size and/or use of iterative reconstruction technique. CONTRAST:  75mL OMNIPAQUE  IOHEXOL  350 MG/ML SOLN COMPARISON:  05/08/2020 FINDINGS: Cardiovascular: Mild coronary artery calcification. Cardiac size is within normal limits. Adequate opacification of the pulmonary arterial tree. No intraluminal filling defect identified to suggest acute pulmonary embolism. Central pulmonary arteries are of normal caliber. No pericardial effusion. Minimal atherosclerotic calcification within the thoracic aorta. No aortic aneurysm. Mediastinum/Nodes: The thyroid  gland is atrophic. Pathologic mediastinal and hilar adenopathy is again identified and is stable since prior examination with the index lymph node within the aortopulmonary window measuring up to  14 mm in short axis diameter within left hilum measuring up to 18 mm in short axis diameter. These are in keeping with a inflammatory or low-grade lymphoproliferative process and are  compatible with the patient's history of sarcoidosis. No new pathologic thoracic adenopathy. The esophagus is unremarkable. Lungs/Pleura: Lungs are clear. No pleural effusion or pneumothorax. Upper Abdomen: No acute abnormality. Musculoskeletal: Dorsal column stimulator device in place with leads posterior to T7-T9. No acute bone abnormality. No lytic or blastic bone lesion. Review of the MIP images confirms the above findings. IMPRESSION: 1. No pulmonary embolism. No acute intrathoracic pathology identified. 2. Mild coronary artery calcification. 3. Stable pathologic mediastinal and hilar adenopathy likely related to the patient's underlying sarcoidosis. 4. Aortic atherosclerosis. Aortic Atherosclerosis (ICD10-I70.0). Electronically Signed   By: Dorethia Molt M.D.   On: 05/04/2024 20:53   DG Chest 2 View Result Date: 05/04/2024 CLINICAL DATA:  Chest pain EXAM: CHEST - 2 VIEW COMPARISON:  Chest x-ray 12/07/2023 FINDINGS: The heart size and mediastinal contours are within normal limits. Both lungs are clear. Cervical spinal fusion plate present. Thoracic spinal cord stimulator device present. IMPRESSION: No active cardiopulmonary disease. Electronically Signed   By: Greig Pique M.D.   On: 05/04/2024 18:21    EKG: Independently reviewed.  Initial EKG was showing sinus tachycardia.  Repeat 1 shows normal sinus rhythm.  Assessment/Plan Principal Problem:   Syncope Active Problems:   Sarcoid   Essential hypertension   Hypothyroidism   Chronic pain   Ankylosing spondylitis (HCC)   ARF (acute renal failure) (HCC)    Syncope -    patient was hypotensive when patient had a syncopal episode.  Will monitor in telemetry.  Patient's 2D echo done in April showed EF of 60 to 65%.  Patient also had PET/CT perfusion scan in February 2025 which was low risk.  Will continue with hydration check orthostatics in the morning hold antihypertensives for now. Acute renal failure with hyponatremia suspect likely from  dehydration.  Continue hydration and follow metabolic panel.  Hold losartan and hydrochlorothiazide for now. Hypertension holding ARB hydrochlorothiazide and metoprolol for now until blood pressure stabilizes.  Patient states she has episodes of tachycardia and palpitation will check plasma metanephrines and TSH. Diabetes mellitus type 2 recent hemoglobin A1c 3 weeks ago was 10.2.  Patient's long-acting insulin  was increased to 50 units recently by patient's endocrinologist.  Patient also takes 25 units Humalog  premeals. Hypothyroidism on Synthroid .  Check TSH. Hyperlipidemia on statins. Chronic pain on gabapentin  and also has spinal stimulator. Ankylosing spondylitis and sarcoidosis being followed by rheumatologist on Simponi . Sleep apnea and intolerant to CPAP. Asthma continue inhalers.  Not wheezing. Restless leg syndrome on Requip . Depression on Paxil . History of migraine.  Since patient had a syncopal episode with renal failure with close monitoring and more than 2 midnight stay.   DVT prophylaxis: Lovenox . Code Status: Full code. Family Communication: Discussed with patient. Disposition Plan: Monitored bed. Consults called: None. Admission status: Observation.

## 2024-05-05 NOTE — Plan of Care (Signed)
 Plan of Care Note for accepted transfer   Patient name: [name] FMW:[id number] DOB: [birth year]  Facility requesting transfer: [facility] Requesting Provider: Dr. Trine Facility course: 58 year old female with history of ankylosing spondylitis, sarcoidosis, anxiety, asthma, postlaminectomy syndrome status post spinal cord stimulator, depression, fibromyalgia, GERD, hypertension, hypothyroidism, IBS, RLS, osteoarthritis, seizures, and other medical comorbidities.  She was admitted in July 2025 for strokelike symptoms felt to be due to complicated migraine versus functional neurologic disorder.  Patient presents to the ED for evaluation of chest pain, shortness of breath, and tachycardia.  On arrival to the ED, patient's heart rate was in the 110s, blood pressure 108/72, temperature 97.8 F, respiratory rate around 20, and SpO2 99% on room air.  EKG showing sinus tachycardia and no acute ischemic changes.  Later patient became hypotensive with SBP dropping to the 60s and had an episode of near syncope.  No arrhythmia noted on cardiac monitoring.  Hypotension and tachycardia resolved after 1 L IV fluid bolus.  No fever or leukocytosis, not anemic, sodium 134, bicarb 16, anion gap 17, glucose 166, BUN 30, creatinine 1.2 (baseline 0.7-0.8), troponin negative x 2, lactate 2.1> 1.3, VBG with pH 7.32.  CT angiogram chest negative for PE or acute intrathoracic pathology.  Plan of care: The patient is accepted for admission to Progressive unit at [facility].  [facility] will assume care on arrival to accepting facility. Until arrival, care as per EDP. However, [facility] available 24/7 for questions and assistance.  Check [email or web address] for on-call coverage.  Nursing staff, please call [facility] Admits & Consults System-Wide number under Amion on patient's arrival so appropriate admitting provider can evaluate the pt.

## 2024-05-05 NOTE — Progress Notes (Signed)
 Patient admitted earlier this am, by Dr Franky.  58 y.o. female with history of hypertension, diabetes mellitus type 2, hypothyroidism, migraine, ankylosing spondylitis, sarcoidosis, sleep apnea, asthma, pancreatic exocrine insufficiency who was recently admitted in July 1 week for strokelike symptoms when patient received TNK presents to the ER after patient was having persistent tachycardia with intermittent chest pressure .   EKG does not show any ischemic changes.  Troponin's negative.  Orthostatic vital signs are negative. D-dimer was unremarkable  She had a near syncope episode and hypotensive on admission.   Holding BP  meds.  Monitor.    Llana Deshazo, MD

## 2024-05-06 ENCOUNTER — Other Ambulatory Visit: Payer: Self-pay

## 2024-05-06 ENCOUNTER — Observation Stay (HOSPITAL_BASED_OUTPATIENT_CLINIC_OR_DEPARTMENT_OTHER)

## 2024-05-06 DIAGNOSIS — E119 Type 2 diabetes mellitus without complications: Secondary | ICD-10-CM

## 2024-05-06 DIAGNOSIS — Z79899 Other long term (current) drug therapy: Secondary | ICD-10-CM | POA: Diagnosis not present

## 2024-05-06 DIAGNOSIS — R55 Syncope and collapse: Secondary | ICD-10-CM

## 2024-05-06 DIAGNOSIS — E785 Hyperlipidemia, unspecified: Secondary | ICD-10-CM | POA: Diagnosis not present

## 2024-05-06 DIAGNOSIS — J45909 Unspecified asthma, uncomplicated: Secondary | ICD-10-CM

## 2024-05-06 DIAGNOSIS — G8929 Other chronic pain: Secondary | ICD-10-CM | POA: Diagnosis not present

## 2024-05-06 DIAGNOSIS — E039 Hypothyroidism, unspecified: Secondary | ICD-10-CM

## 2024-05-06 DIAGNOSIS — Z7982 Long term (current) use of aspirin: Secondary | ICD-10-CM | POA: Diagnosis not present

## 2024-05-06 DIAGNOSIS — D869 Sarcoidosis, unspecified: Secondary | ICD-10-CM | POA: Diagnosis not present

## 2024-05-06 DIAGNOSIS — K219 Gastro-esophageal reflux disease without esophagitis: Secondary | ICD-10-CM | POA: Diagnosis not present

## 2024-05-06 DIAGNOSIS — I10 Essential (primary) hypertension: Secondary | ICD-10-CM | POA: Diagnosis not present

## 2024-05-06 DIAGNOSIS — M459 Ankylosing spondylitis of unspecified sites in spine: Secondary | ICD-10-CM | POA: Diagnosis not present

## 2024-05-06 DIAGNOSIS — N179 Acute kidney failure, unspecified: Secondary | ICD-10-CM | POA: Diagnosis not present

## 2024-05-06 DIAGNOSIS — Z794 Long term (current) use of insulin: Secondary | ICD-10-CM | POA: Diagnosis not present

## 2024-05-06 DIAGNOSIS — G4730 Sleep apnea, unspecified: Secondary | ICD-10-CM | POA: Diagnosis not present

## 2024-05-06 LAB — GLUCOSE, CAPILLARY
Glucose-Capillary: 130 mg/dL — ABNORMAL HIGH (ref 70–99)
Glucose-Capillary: 153 mg/dL — ABNORMAL HIGH (ref 70–99)
Glucose-Capillary: 141 mg/dL — ABNORMAL HIGH (ref 70–99)
Glucose-Capillary: 126 mg/dL — ABNORMAL HIGH (ref 70–99)

## 2024-05-06 LAB — T3, FREE: T3, Free: 2.6 pg/mL (ref 2.0–4.4)

## 2024-05-06 LAB — BASIC METABOLIC PANEL WITH GFR
Anion gap: 11 (ref 5–15)
BUN: 13 mg/dL (ref 6–20)
CO2: 20 mmol/L — ABNORMAL LOW (ref 22–32)
Calcium: 8.9 mg/dL (ref 8.9–10.3)
Chloride: 106 mmol/L (ref 98–111)
Creatinine, Ser: 0.98 mg/dL (ref 0.44–1.00)
GFR, Estimated: >60 mL/min (ref >60)
Glucose, Bld: 137 mg/dL — ABNORMAL HIGH (ref 70–99)
Potassium: 4 mmol/L (ref 3.5–5.1)
Sodium: 137 mmol/L (ref 135–145)

## 2024-05-06 MED ORDER — HYDROXYZINE HCL 25 MG PO TABS
25.0000 mg | ORAL_TABLET | Freq: Three times a day (TID) | ORAL | Status: DC | PRN
  Administered 2024-05-06 (×2): 25 mg via ORAL
  Filled 2024-05-06 (×2): qty 1

## 2024-05-06 MED ORDER — NITROGLYCERIN 0.4 MG SL SUBL
SUBLINGUAL_TABLET | SUBLINGUAL | Status: AC
  Filled 2024-05-06: qty 1

## 2024-05-06 MED ORDER — LORATADINE 10 MG PO TABS
10.0000 mg | ORAL_TABLET | Freq: Every day | ORAL | Status: DC
  Administered 2024-05-06 – 2024-05-07 (×2): 10 mg via ORAL
  Filled 2024-05-06 (×2): qty 1

## 2024-05-06 MED ORDER — ROPINIROLE HCL 1 MG PO TABS
2.0000 mg | ORAL_TABLET | Freq: Every day | ORAL | Status: DC | PRN
  Administered 2024-05-06: 2 mg via ORAL
  Filled 2024-05-06: qty 2

## 2024-05-06 MED ORDER — LOSARTAN POTASSIUM 50 MG PO TABS
50.0000 mg | ORAL_TABLET | Freq: Every day | ORAL | Status: DC
  Administered 2024-05-06 – 2024-05-07 (×2): 50 mg via ORAL
  Filled 2024-05-06 (×2): qty 1

## 2024-05-06 NOTE — Progress Notes (Signed)
 VASCULAR LAB    Carotid duplex has been performed.  See CV proc for preliminary results.   Hamlin Devine, RVT 05/06/2024, 12:02 PM

## 2024-05-06 NOTE — Progress Notes (Signed)
 [facility]                                                                               [name], is a 58 y.o. female, DOB - July 08, 1966, FMW:[id number] Admit date - 05/04/2024    Outpatient Primary MD for the patient is Sabrina Anthony RAMAN, FNP  LOS - 0  days    Brief summary   58 y.o. female with history of hypertension, diabetes mellitus type 2, hypothyroidism, migraine, ankylosing spondylitis, sarcoidosis, sleep apnea, asthma, pancreatic exocrine insufficiency who was recently admitted in July 1 week for strokelike symptoms when patient received TNK presents to the ER after patient was having persistent tachycardia with intermittent chest pressure .   Assessment & Plan    Assessment and Plan:   Syncope suspect from hypotensive episode.  Patient also had PET/CT perfusion scan in February 2025 which was low risk.  Echo done in April 2025 showed preserved LVEF.  Troponins negative.  EKG wnl. No signs of ischemia.  Carotid duplex ordered and pending.  Orthostatic vital signs are negative.     AKI with hyponatremia:  Creatinine much improved with IV fluids.    Hypothyroidism:  TSH high, but free t3 and t4 are wnl.   Asthma, no exacerbation.    Type 2 DM with hyperglycemia CBG (last 3)  Recent Labs    05/05/24 2128 05/06/24 0615 05/06/24 1222  GLUCAP 168* 130* 153*   Resume SSI.    RLS Resume requip .    Hyperlipidemia Resume crestor .    pancreatic exocrine insufficiency  Resume home meds.    Estimated body mass index is 31.63 kg/m as calculated from the following:   Height as of this encounter: 5' 6 (1.676 m).   Weight as of this encounter: 88.9 kg.  Code Status: full code.  DVT Prophylaxis:  enoxaparin  (LOVENOX ) injection 40 mg Start: 05/05/24 1000   Level of Care: Level of care: Telemetry Medical Family Communication: none at bedside.   Disposition Plan:     Remains inpatient appropriate:  pending carotid duplex.    Procedures:  Carotid duplex  Consultants:   None.   Antimicrobials:   Anti-infectives (From admission, onward)    Start     Dose/Rate Route Frequency Ordered Stop   05/05/24 2200  valACYclovir  (VALTREX ) tablet 500 mg        500 mg Oral Daily at bedtime 05/05/24 1628          Medications  Scheduled Meds:  aspirin  EC  81 mg Oral Daily   Atogepant   1 tablet Oral Daily   celecoxib   200 mg Oral QHS   empagliflozin   25 mg Oral QAC breakfast   enoxaparin  (LOVENOX ) injection  40 mg Subcutaneous Q24H   fluticasone  furoate-vilanterol  1 puff Inhalation Daily   gabapentin   200 mg Oral QHS   insulin  aspart  0-9 Units Subcutaneous TID WC   insulin  glargine  50 Units Subcutaneous Daily   levothyroxine   137 mcg Oral QAC breakfast   lipase/protease/amylase  24,000 Units Oral TID WC   loratadine   10 mg Oral Daily   pantoprazole   40 mg Oral Daily   PARoxetine   40 mg  Oral QHS   rOPINIRole   4 mg Oral QHS   rosuvastatin   10 mg Oral QHS   valACYclovir   500 mg Oral QHS   vitamin B-12  100 mcg Oral QPM   Continuous Infusions: PRN Meds:.acetaminophen  **OR** acetaminophen , albuterol , HYDROmorphone  (DILAUDID ) injection, hydrOXYzine , rOPINIRole     Subjective:   [name] was seen and examined today.  Requesting requip    Objective:   Vitals:   05/05/24 2353 05/06/24 0421 05/06/24 0805 05/06/24 1528  BP: 126/79 124/76 (!) 141/88 (!) 164/94  Pulse: 79 76 82 82  Resp: 20 20 20    Temp:  97.8 F (36.6 C) 98 F (36.7 C) 98.8 F (37.1 C)  TempSrc: Oral Oral Oral Oral  SpO2: 98% 94% 96%   Weight:  88.9 kg    Height:        Intake/Output Summary (Last 24 hours) at 05/06/2024 1627 Last data filed at 05/06/2024 0807 Gross per 24 hour  Intake 2201.24 ml  Output --  Net 2201.24 ml   Filed Weights   05/04/24 1733 05/05/24 0146 05/06/24 0421  Weight: 86.2 kg 86.4 kg 88.9 kg     Exam General exam: Appears calm and comfortable  Respiratory system: Clear to auscultation.  Respiratory effort normal. Cardiovascular system: S1 & S2 heard, RRR.  Gastrointestinal system: Abdomen is nondistended, soft and nontender.  Central nervous system: Alert and oriented.  Extremities: Symmetric 5 x 5 power. Skin: No rashes,  Psychiatry:  Mood & affect appropriate.     Data Reviewed:  I have personally reviewed following labs and imaging studies   CBC Lab Results  Component Value Date   WBC 8.8 05/05/2024   RBC 4.40 05/05/2024   HGB 12.8 05/05/2024   HCT 36.7 05/05/2024   MCV 83.4 05/05/2024   MCH 29.1 05/05/2024   PLT 281 05/05/2024   MCHC 34.9 05/05/2024   RDW 13.6 05/05/2024   LYMPHSABS 2.5 05/05/2024   MONOABS 1.0 05/05/2024   EOSABS 0.3 05/05/2024   BASOSABS 0.1 05/05/2024     Last metabolic panel Lab Results  Component Value Date   NA 139 05/05/2024   K 3.6 05/05/2024   CL 108 05/05/2024   CO2 21 (L) 05/05/2024   BUN 20 05/05/2024   CREATININE 1.08 (H) 05/05/2024   GLUCOSE 121 (H) 05/05/2024   GFRNONAA 60 (L) 05/05/2024   GFRAA >60 02/03/2020   CALCIUM  8.6 (L) 05/05/2024   PHOS 4.2 12/08/2023   PROT 6.5 05/05/2024   ALBUMIN 3.7 05/05/2024   BILITOT 0.8 05/05/2024   ALKPHOS 75 05/05/2024   AST 24 05/05/2024   ALT 28 05/05/2024   ANIONGAP 10 05/05/2024    CBG (last 3)  Recent Labs    05/05/24 2128 05/06/24 0615 05/06/24 1222  GLUCAP 168* 130* 153*      Coagulation Profile: No results for input(s): INR, PROTIME in the last 168 hours.   Radiology Studies: VAS US  CAROTID Result Date: 05/06/2024 Carotid Arterial Duplex Study Patient Name:  [name] [facility]  Date of Exam:   05/06/2024 Medical Rec #: [id number]             Accession #:    [id number] Date of Birth: 1966-01-29             Patient Gender: F Patient Age:   34 years Exam Location:  [facility] Procedure:      VAS US  CAROTID Referring Phys: ELGIE BUTTER --------------------------------------------------------------------------------  Indications:       Syncope  and Dizziness, tachycardia,  hypotension. Risk Factors:      Hypertension, Diabetes. Sarcoid. Sleep apnea. Other Factors:     Patient admitted to hospital in July and had negative stroke                    workup for same symptoms. CTA of neck at that admission was                    normal. Limitations        Today's exam was limited Foster to Snoring/apnea and the                    patient's respiratory variation, vessel tortuosity. Comparison Study:  No prior carotid duplex on file Performing Technologist: Alberta Lis RVS  Examination Guidelines: A complete evaluation includes B-mode imaging, spectral Doppler, color Doppler, and power Doppler as needed of all accessible portions of each vessel. Bilateral testing is considered an integral part of a complete examination. Limited examinations for reoccurring indications may be performed as noted.  Right Carotid Findings: +----------+--------+--------+--------+------------------+------------------+           PSV cm/sEDV cm/sStenosisPlaque DescriptionComments           +----------+--------+--------+--------+------------------+------------------+ CCA Prox  84      22                                intimal thickening +----------+--------+--------+--------+------------------+------------------+ CCA Distal81      25                                intimal thickening +----------+--------+--------+--------+------------------+------------------+ ICA Prox  125     34              homogeneous                          +----------+--------+--------+--------+------------------+------------------+ ICA Mid   100     35                                tortuous           +----------+--------+--------+--------+------------------+------------------+ ICA Distal111     44                                tortuous           +----------+--------+--------+--------+------------------+------------------+ ECA       129     14                                                    +----------+--------+--------+--------+------------------+------------------+ +----------+--------+-------+--------+-------------------+           PSV cm/sEDV cmsDescribeArm Pressure (mmHG) +----------+--------+-------+--------+-------------------+ Subclavian162                                        +----------+--------+-------+--------+-------------------+ +---------+--------+--+--------+--+ VertebralPSV cm/s41EDV cm/s14 +---------+--------+--+--------+--+  Left Carotid Findings: +----------+--------+--------+--------+------------------+------------------+           PSV cm/sEDV cm/sStenosisPlaque DescriptionComments           +----------+--------+--------+--------+------------------+------------------+  CCA Prox  76      24                                intimal thickening +----------+--------+--------+--------+------------------+------------------+ CCA Distal102     38                                intimal thickening +----------+--------+--------+--------+------------------+------------------+ ICA Prox  70      29              heterogenous                         +----------+--------+--------+--------+------------------+------------------+ ICA Mid   73      29                                                   +----------+--------+--------+--------+------------------+------------------+ ICA Distal76      23                                                   +----------+--------+--------+--------+------------------+------------------+ ECA       108     12                                                   +----------+--------+--------+--------+------------------+------------------+ +----------+--------+--------+--------+-------------------+           PSV cm/sEDV cm/sDescribeArm Pressure (mmHG) +----------+--------+--------+--------+-------------------+ [id number]                                           +----------+--------+--------+--------+-------------------+ +---------+--------+--+--------+--+ VertebralPSV cm/s43EDV cm/s19 +---------+--------+--+--------+--+   Summary: Right Carotid: The extracranial vessels were near-normal with only minimal wall                thickening or plaque. Left Carotid: The extracranial vessels were near-normal with only minimal wall               thickening or plaque. Vertebrals:  Bilateral vertebral arteries demonstrate antegrade flow. Subclavians: Normal flow hemodynamics were seen in bilateral subclavian              arteries. *See table(s) above for measurements and observations.     Preliminary    CT Angio Chest PE W/Cm &/Or Wo Cm Result Date: 05/04/2024 CLINICAL DATA:  Tachycardia, high probability pulmonary embolism, chest pain, dyspnea EXAM: CT ANGIOGRAPHY CHEST WITH CONTRAST TECHNIQUE: Multidetector CT imaging of the chest was performed using the standard protocol during bolus administration of intravenous contrast. Multiplanar CT image reconstructions and MIPs were obtained to evaluate the vascular anatomy. RADIATION DOSE REDUCTION: This exam was performed according to the departmental dose-optimization program which includes automated exposure control, adjustment of the mA and/or kV according to patient size and/or use of iterative reconstruction technique. CONTRAST:  75mL OMNIPAQUE  IOHEXOL  350 MG/ML SOLN COMPARISON:  05/08/2020 FINDINGS: Cardiovascular: Mild coronary  artery calcification. Cardiac size is within normal limits. Adequate opacification of the pulmonary arterial tree. No intraluminal filling defect identified to suggest acute pulmonary embolism. Central pulmonary arteries are of normal caliber. No pericardial effusion. Minimal atherosclerotic calcification within the thoracic aorta. No aortic aneurysm. Mediastinum/Nodes: The thyroid  gland is atrophic. Pathologic mediastinal and hilar adenopathy is again identified and is stable since prior examination  with the index lymph node within the aortopulmonary window measuring up to 14 mm in short axis diameter within left hilum measuring up to 18 mm in short axis diameter. These are in keeping with a inflammatory or low-grade lymphoproliferative process and are compatible with the patient's history of sarcoidosis. No new pathologic thoracic adenopathy. The esophagus is unremarkable. Lungs/Pleura: Lungs are clear. No pleural effusion or pneumothorax. Upper Abdomen: No acute abnormality. Musculoskeletal: Dorsal column stimulator device in place with leads posterior to T7-T9. No acute bone abnormality. No lytic or blastic bone lesion. Review of the MIP images confirms the above findings. IMPRESSION: 1. No pulmonary embolism. No acute intrathoracic pathology identified. 2. Mild coronary artery calcification. 3. Stable pathologic mediastinal and hilar adenopathy likely related to the patient's underlying sarcoidosis. 4. Aortic atherosclerosis. Aortic Atherosclerosis (ICD10-I70.0). Electronically Signed   By: Dorethia Molt M.D.   On: 05/04/2024 20:53   DG Chest 2 View Result Date: 05/04/2024 CLINICAL DATA:  Chest pain EXAM: CHEST - 2 VIEW COMPARISON:  Chest x-ray 12/07/2023 FINDINGS: The heart size and mediastinal contours are within normal limits. Both lungs are clear. Cervical spinal fusion plate present. Thoracic spinal cord stimulator device present. IMPRESSION: No active cardiopulmonary disease. Electronically Signed   By: Greig Pique M.D.   On: 05/04/2024 18:21       Elgie Butter M.D. [facility] 05/06/2024, 4:27 PM  Available via Epic secure chat 7am-7pm After 7 pm, please refer to night coverage provider listed on amion.

## 2024-05-06 NOTE — Progress Notes (Signed)
 Mobility Specialist Progress Note:    05/06/24 1412  Mobility  Activity Turned to back - supine (Bed Level Exercises (Ankle Pumps, Leg lifts, Quad sets))  Level of Assistance Standby assist, set-up cues, supervision of patient - no hands on  Assistive Device None  Range of Motion/Exercises Left leg;Right leg  Activity Response Tolerated fair  Mobility Referral Yes  Mobility visit 1 Mobility  Mobility Specialist Start Time (ACUTE ONLY) 1412  Mobility Specialist Stop Time (ACUTE ONLY) 1418  Mobility Specialist Time Calculation (min) (ACUTE ONLY) 6 min   Pt not agreeable to walk but willing to do bed level exercise. C/o fatigue and not feeling too well. Pt willing to do x10 ankle pumps, leg curl/lifts, and quad sets. Pt left in bed w/ all needs met.   Venetia Keel Mobility Specialist Please Neurosurgeon or Rehab Office at [phone]

## 2024-05-06 NOTE — Plan of Care (Signed)
  Problem: Clinical Measurements: Goal: Diagnostic test results will improve Outcome: Progressing Goal: Respiratory complications will improve Outcome: Progressing Goal: Cardiovascular complication will be avoided Outcome: Progressing   

## 2024-05-07 DIAGNOSIS — E039 Hypothyroidism, unspecified: Secondary | ICD-10-CM | POA: Diagnosis not present

## 2024-05-07 DIAGNOSIS — R55 Syncope and collapse: Secondary | ICD-10-CM | POA: Diagnosis not present

## 2024-05-07 DIAGNOSIS — N179 Acute kidney failure, unspecified: Secondary | ICD-10-CM | POA: Diagnosis not present

## 2024-05-07 DIAGNOSIS — E119 Type 2 diabetes mellitus without complications: Secondary | ICD-10-CM | POA: Diagnosis not present

## 2024-05-07 DIAGNOSIS — I10 Essential (primary) hypertension: Secondary | ICD-10-CM | POA: Diagnosis not present

## 2024-05-07 LAB — GLUCOSE, CAPILLARY
Glucose-Capillary: 127 mg/dL — ABNORMAL HIGH (ref 70–99)
Glucose-Capillary: 120 mg/dL — ABNORMAL HIGH (ref 70–99)
Glucose-Capillary: 148 mg/dL — ABNORMAL HIGH (ref 70–99)

## 2024-05-07 MED ORDER — HYDROXYZINE HCL 25 MG PO TABS: 25.0000 mg | ORAL_TABLET | Freq: Three times a day (TID) | ORAL | 0 refills | Status: AC | PRN

## 2024-05-07 MED ORDER — LOSARTAN POTASSIUM 50 MG PO TABS: 50.0000 mg | ORAL_TABLET | Freq: Every day | ORAL | 2 refills | Status: AC

## 2024-05-08 LAB — METANEPHRINES, PLASMA
Metanephrine, Free: <25 pg/mL (ref 0.0–88.0)
Normetanephrine, Free: 127.6 pg/mL (ref 0.0–244.0)

## 2024-05-09 DIAGNOSIS — F3341 Major depressive disorder, recurrent, in partial remission: Secondary | ICD-10-CM | POA: Diagnosis not present

## 2024-05-09 DIAGNOSIS — Z794 Long term (current) use of insulin: Secondary | ICD-10-CM | POA: Diagnosis not present

## 2024-05-09 DIAGNOSIS — E785 Hyperlipidemia, unspecified: Secondary | ICD-10-CM | POA: Diagnosis not present

## 2024-05-09 DIAGNOSIS — E039 Hypothyroidism, unspecified: Secondary | ICD-10-CM | POA: Diagnosis not present

## 2024-05-09 DIAGNOSIS — E1165 Type 2 diabetes mellitus with hyperglycemia: Secondary | ICD-10-CM | POA: Diagnosis not present

## 2024-05-09 DIAGNOSIS — M459 Ankylosing spondylitis of unspecified sites in spine: Secondary | ICD-10-CM | POA: Diagnosis not present

## 2024-05-09 DIAGNOSIS — Z23 Encounter for immunization: Secondary | ICD-10-CM | POA: Diagnosis not present

## 2024-05-09 DIAGNOSIS — E559 Vitamin D deficiency, unspecified: Secondary | ICD-10-CM | POA: Diagnosis not present

## 2024-05-09 DIAGNOSIS — Z0000 Encounter for general adult medical examination without abnormal findings: Secondary | ICD-10-CM | POA: Diagnosis not present

## 2024-05-09 DIAGNOSIS — I998 Other disorder of circulatory system: Secondary | ICD-10-CM | POA: Diagnosis not present

## 2024-05-09 DIAGNOSIS — I10 Essential (primary) hypertension: Secondary | ICD-10-CM | POA: Diagnosis not present

## 2024-05-09 DIAGNOSIS — E538 Deficiency of other specified B group vitamins: Secondary | ICD-10-CM | POA: Diagnosis not present

## 2024-05-09 LAB — CULTURE, BLOOD (ROUTINE X 2)
Culture: NO GROWTH
Culture: NO GROWTH
Special Requests: ADEQUATE
Special Requests: ADEQUATE

## 2024-05-09 NOTE — Discharge Summary (Addendum)
 Physician Discharge Summary   Patient: [name] MRN: [id number] DOB: [birth year]  Admit date:     05/04/2024  Discharge date: 05/07/2024  Discharge Physician: Elgie Butter   PCP: [name], FNP   Recommendations at discharge:  Please follow up with PCP in one week.  Please follow up with cardiology in 2 weeks.  Please follow up cbc and BMP in one week.    Discharge Diagnoses: Principal Problem:   Syncope Active Problems:   Extrinsic asthma   Sarcoid   Palpitations   Depression   Essential hypertension   Hypothyroidism   Chronic pain   Ankylosing spondylitis (HCC)   ARF (acute renal failure) (HCC)   Diabetes mellitus type 2 in nonobese [facility])    Hospital Course: 58 y.o. female with history of hypertension, diabetes mellitus type 2, hypothyroidism, migraine, ankylosing spondylitis, sarcoidosis, sleep apnea, asthma, pancreatic exocrine insufficiency who was recently admitted in July 1 week for strokelike symptoms when patient received TNK presents to the ER after patient was having persistent tachycardia with intermittent chest pressure .   Assessment and Plan:   Syncope suspect from hypotensive episode.  No neurological deficits.  Patient also had PET/CT perfusion scan in February 2025 which was low risk.  Echo done in April 2025 showed preserved LVEF.  Troponins negative.  EKG wnl. No signs of ischemia.  Carotid duplex done and reviewed with the patient.  Repeat Orthostatic vital signs are negative.      Hypertension BP have normalized.  We have held hydrochlorothiazide on discharge.  Decreased the dose of losatan to 50 mg daily.  Meanwhile continue with metoprolol at home dose.  Discussed the above changes to the patient.      AKI with hyponatremia:  Creatinine much improved with IV fluids.  Creatinine back to baseline.  Sodium normalized.      Hypothyroidism:  TSH high, but free t3 and t4 are wnl.     Asthma, no exacerbation.       Type 2 DM with hyperglycemia Resume home meds on discharge.      RLS Resume requip .      Hyperlipidemia Resume crestor .      pancreatic exocrine insufficiency  Resume home meds.    Intermittent chest pressure:not related to activity. Not radiating. No sob.   Resolved.  EKG wnl.  ACS ruled out.  Patient had recent Patient also had PET/CT perfusion scan in February 2025 which was low risk.  Echo done in April 2025 showed preserved LVEF.  Suspect a component of anxiety, added atarax .     Consultants: none Procedures performed: carotid duplex.   Disposition: Home Diet recommendation:  Discharge Diet Orders (From admission, onward)     Start     Ordered   05/07/24 0000  Diet - low sodium heart healthy        05/07/24 0944           Carb modified diet DISCHARGE MEDICATION: Allergies as of 05/07/2024       Reactions   Lyrica [pregabalin] Anaphylaxis   Topiramate  Other (See Comments)   Pt had hallucinations   Humira [adalimumab] Other (See Comments)   Muscle weakness   Infliximab  Hives, Itching, Rash, Other (See Comments)    infusion reaction   Percocet [oxycodone -acetaminophen ] Itching   Sulfamethoxazole-trimethoprim Other (See Comments)   Fever   Vicodin [hydrocodone -acetaminophen ] Itching        Medication List     STOP taking these medications    hydrochlorothiazide 12.5 MG  tablet Commonly known as: HYDRODIURIL       TAKE these medications    albuterol  108 (90 Base) MCG/ACT inhaler Commonly known as: VENTOLIN  HFA Inhale 2 puffs into the lungs every 6 (six) hours as needed for wheezing or shortness of breath.   aspirin  EC 81 MG tablet Take 1 tablet (81 mg total) by mouth daily. Swallow whole.   butalbital -acetaminophen -caffeine  50-325-40 MG tablet Commonly known as: FIORICET  Take 1 tablet by mouth every 6 (six) hours as needed for headache or migraine.   celecoxib  200 MG capsule Commonly known as: CELEBREX  Take 200 mg by mouth at  bedtime.   Dexcom G7 Receiver Devi 1 Device by Does not apply route continuous. Change sensor every 10 days   Dexcom G7 Sensor Misc Use to check glucose continuously, change sensor every 10 days   empagliflozin  25 MG Tabs tablet Commonly known as: Jardiance  Take 1 tablet (25 mg total) by mouth daily before breakfast.   fluticasone  furoate-vilanterol 100-25 MCG/ACT Aepb Commonly known as: Breo Ellipta  Inhale 1 puff into the lungs daily.   gabapentin  100 MG capsule Commonly known as: NEURONTIN  Take 200 mg by mouth at bedtime.   hydroxychloroquine 200 MG tablet Commonly known as: PLAQUENIL Take 200 mg by mouth 2 (two) times daily.   hydrOXYzine  25 MG tablet Commonly known as: ATARAX  Take 1 tablet (25 mg total) by mouth 3 (three) times daily as needed for anxiety.   levothyroxine  137 MCG tablet Commonly known as: SYNTHROID  Take 137 mcg by mouth daily before breakfast.   lipase/protease/amylase 12000-38000 units Cpep capsule Commonly known as: CREON  Take 1-2 capsules by mouth See admin instructions. Take two capsules by mouth  with each meal and 1 capsule by mouth with a snack per patient   losartan  50 MG tablet Commonly known as: COZAAR  Take 1 tablet (50 mg total) by mouth daily. What changed:  medication strength how much to take   metoprolol succinate 25 MG 24 hr tablet Commonly known as: TOPROL-XL Take 25 mg by mouth daily.   naloxone 2 MG/2ML injection Commonly known as: NARCAN Inject 0.4 mg into the skin daily as needed (for overdose).   NovoLOG  FlexPen 100 UNIT/ML FlexPen Generic drug: insulin  aspart Take 25 units plus sliding scale with meals 3 times a day, maximum 75 units/day.   omeprazole 40 MG capsule Commonly known as: PRILOSEC Take 80 mg by mouth every evening.   ondansetron  4 MG tablet Commonly known as: Zofran  Take 1 tablet (4 mg total) by mouth every 8 (eight) hours as needed for nausea or vomiting.   PARoxetine  40 MG tablet Commonly known  as: PAXIL  Take 40 mg by mouth at bedtime.   promethazine  25 MG tablet Commonly known as: PHENERGAN  Take 1 tablet by mouth daily as needed for nausea/vomiting.   Qulipta  60 MG Tabs Generic drug: Atogepant  Take 1 tablet by mouth daily.   rOPINIRole  2 MG tablet Commonly known as: REQUIP  Take 4 mg by mouth at bedtime.   rosuvastatin  10 MG tablet Commonly known as: CRESTOR  Take 10 mg by mouth at bedtime.   tirzepatide  2.5 MG/0.5ML Pen Commonly known as: MOUNJARO  Inject 2.5 mg into the skin once a week.   Toujeo  Max SoloStar 300 UNIT/ML Solostar Pen Generic drug: insulin  glargine (2 Unit Dial ) Inject 50 Units into the skin daily.   valACYclovir  500 MG tablet Commonly known as: VALTREX  Take 500 mg by mouth at bedtime.   vitamin B-12 100 MCG tablet Commonly known as: CYANOCOBALAMIN  Take 100 mcg  by mouth every evening.   Vitamin D3 1.25 MG (50000 UT) Caps Take 50,000 Units by mouth once a week. Thursday        Follow-up Information     [facility] Follow up.   Specialty: Rehabilitation Why: They will call you to set up apt times, if you do not hear from them in 3 business days  please give them a call Contact information: [address] [place]  [address] [phone]        [name], FNP. Schedule an appointment as soon as possible for a visit in 1 week(s).   Specialty: Family Medicine Contact information: [address] [place] [address] [place] [address] [phone]                Discharge Exam: Fredricka Weights   05/05/24 0146 05/06/24 0421 05/07/24 0033  Weight: 86.4 kg 88.9 kg 88.2 kg   General exam: Appears calm and comfortable  Respiratory system: Clear to auscultation. Respiratory effort normal. Cardiovascular system: S1 & S2 heard, RRR.  Gastrointestinal system: Abdomen is nondistended, soft and nontender.  Central nervous system: Alert and oriented.   Extremities: Symmetric 5 x 5 power. Skin: No rashes,  Psychiatry: . Mood & affect appropriate.    Condition at discharge: fair  The results of significant diagnostics from this hospitalization (including imaging, microbiology, ancillary and laboratory) are listed below for reference.   Imaging Studies: VAS US  CAROTID Result Date: 05/07/2024 Carotid Arterial Duplex Study Patient Name:  YATZARY MERRIWEATHER [facility]  Date of Exam:   05/06/2024 Medical Rec #: [id number]             Accession #:    [id number] Date of Birth: [birth year]             Patient Gender: F Patient Age:   82 years Exam Location:  [facility] Procedure:      VAS US  CAROTID Referring Phys: ELGIE BUTTER --------------------------------------------------------------------------------  Indications:       Syncope and Dizziness, tachycardia, hypotension. Risk Factors:      Hypertension, Diabetes. Sarcoid. Sleep apnea. Other Factors:     Patient admitted to hospital in July and had negative stroke                    workup for same symptoms. CTA of neck at that admission was                    normal. Limitations        Today's exam was limited due to Snoring/apnea and the                    patient's respiratory variation, vessel tortuosity. Comparison Study:  No prior carotid duplex on file Performing Technologist: Alberta Lis RVS  Examination Guidelines: A complete evaluation includes B-mode imaging, spectral Doppler, color Doppler, and power Doppler as needed of all accessible portions of each vessel. Bilateral testing is considered an integral part of a complete examination. Limited examinations for reoccurring indications may be performed as noted.  Right Carotid Findings: +----------+--------+--------+--------+------------------+------------------+           PSV cm/sEDV cm/sStenosisPlaque DescriptionComments           +----------+--------+--------+--------+------------------+------------------+ CCA Prox  84      22  intimal thickening +----------+--------+--------+--------+------------------+------------------+ CCA Distal81      25                                intimal thickening +----------+--------+--------+--------+------------------+------------------+ ICA Prox  125     34              homogeneous                          +----------+--------+--------+--------+------------------+------------------+ ICA Mid   100     35                                tortuous           +----------+--------+--------+--------+------------------+------------------+ ICA Distal111     44                                tortuous           +----------+--------+--------+--------+------------------+------------------+ ECA       129     14                                                   +----------+--------+--------+--------+------------------+------------------+ +----------+--------+-------+--------+-------------------+           PSV cm/sEDV cmsDescribeArm Pressure (mmHG) +----------+--------+-------+--------+-------------------+ Subclavian162                                        +----------+--------+-------+--------+-------------------+ +---------+--------+--+--------+--+ VertebralPSV cm/s41EDV cm/s14 +---------+--------+--+--------+--+  Left Carotid Findings: +----------+--------+--------+--------+------------------+------------------+           PSV cm/sEDV cm/sStenosisPlaque DescriptionComments           +----------+--------+--------+--------+------------------+------------------+ CCA Prox  76      24                                intimal thickening +----------+--------+--------+--------+------------------+------------------+ CCA Distal102     38                                intimal thickening +----------+--------+--------+--------+------------------+------------------+ ICA Prox  70      29              heterogenous                          +----------+--------+--------+--------+------------------+------------------+ ICA Mid   73      29                                                   +----------+--------+--------+--------+------------------+------------------+ ICA Distal76      23                                                   +----------+--------+--------+--------+------------------+------------------+  ECA       108     12                                                   +----------+--------+--------+--------+------------------+------------------+ +----------+--------+--------+--------+-------------------+           PSV cm/sEDV cm/sDescribeArm Pressure (mmHG) +----------+--------+--------+--------+-------------------+ [id number]                                          +----------+--------+--------+--------+-------------------+ +---------+--------+--+--------+--+ VertebralPSV cm/s43EDV cm/s19 +---------+--------+--+--------+--+   Summary: Right Carotid: The extracranial vessels were near-normal with only minimal wall                thickening or plaque. Left Carotid: The extracranial vessels were near-normal with only minimal wall               thickening or plaque. Vertebrals:  Bilateral vertebral arteries demonstrate antegrade flow. Subclavians: Normal flow hemodynamics were seen in bilateral subclavian              arteries. *See table(s) above for measurements and observations.  Electronically signed by Gaile New MD on 05/07/2024 at 8:24:20 PM.    Final    CT Angio Chest PE W/Cm &/Or Wo Cm Result Date: 05/04/2024 CLINICAL DATA:  Tachycardia, high probability pulmonary embolism, chest pain, dyspnea EXAM: CT ANGIOGRAPHY CHEST WITH CONTRAST TECHNIQUE: Multidetector CT imaging of the chest was performed using the standard protocol during bolus administration of intravenous contrast. Multiplanar CT image reconstructions and MIPs were obtained to evaluate the vascular anatomy. RADIATION DOSE REDUCTION:  This exam was performed according to the departmental dose-optimization program which includes automated exposure control, adjustment of the mA and/or kV according to patient size and/or use of iterative reconstruction technique. CONTRAST:  75mL OMNIPAQUE  IOHEXOL  350 MG/ML SOLN COMPARISON:  05/08/2020 FINDINGS: Cardiovascular: Mild coronary artery calcification. Cardiac size is within normal limits. Adequate opacification of the pulmonary arterial tree. No intraluminal filling defect identified to suggest acute pulmonary embolism. Central pulmonary arteries are of normal caliber. No pericardial effusion. Minimal atherosclerotic calcification within the thoracic aorta. No aortic aneurysm. Mediastinum/Nodes: The thyroid  gland is atrophic. Pathologic mediastinal and hilar adenopathy is again identified and is stable since prior examination with the index lymph node within the aortopulmonary window measuring up to 14 mm in short axis diameter within left hilum measuring up to 18 mm in short axis diameter. These are in keeping with a inflammatory or low-grade lymphoproliferative process and are compatible with the patient's history of sarcoidosis. No new pathologic thoracic adenopathy. The esophagus is unremarkable. Lungs/Pleura: Lungs are clear. No pleural effusion or pneumothorax. Upper Abdomen: No acute abnormality. Musculoskeletal: Dorsal column stimulator device in place with leads posterior to T7-T9. No acute bone abnormality. No lytic or blastic bone lesion. Review of the MIP images confirms the above findings. IMPRESSION: 1. No pulmonary embolism. No acute intrathoracic pathology identified. 2. Mild coronary artery calcification. 3. Stable pathologic mediastinal and hilar adenopathy likely related to the patient's underlying sarcoidosis. 4. Aortic atherosclerosis. Aortic Atherosclerosis (ICD10-I70.0). Electronically Signed   By: Dorethia Molt M.D.   On: 05/04/2024 20:53   DG Chest 2 View Result Date:  05/04/2024 CLINICAL DATA:  Chest pain EXAM: CHEST - 2 VIEW COMPARISON:  Chest x-ray 12/07/2023 FINDINGS:  The heart size and mediastinal contours are within normal limits. Both lungs are clear. Cervical spinal fusion plate present. Thoracic spinal cord stimulator device present. IMPRESSION: No active cardiopulmonary disease. Electronically Signed   By: Greig Pique M.D.   On: 05/04/2024 18:21    Microbiology: Results for orders placed or performed during the hospital encounter of 05/04/24  Culture, blood (Routine X 2) w Reflex to ID Panel     Status: None   Collection Time: 05/04/24  8:19 PM   Specimen: BLOOD  Result Value Ref Range Status   Specimen Description   Final    BLOOD BLOOD LEFT FOREARM Performed at [facility], [address], [place], [place] [address]    Special Requests   Final    BOTTLES DRAWN AEROBIC AND ANAEROBIC Blood Culture adequate volume Performed at [facility], [address], [place], [place] [address]    Culture   Final    NO GROWTH 5 DAYS Performed at [facility], [address], [place], [place] [address]    Report Status 05/09/2024 FINAL  Final  Culture, blood (Routine X 2) w Reflex to ID Panel     Status: None   Collection Time: 05/04/24  8:19 PM   Specimen: BLOOD  Result Value Ref Range Status   Specimen Description   Final    BLOOD BLOOD RIGHT HAND Performed at [facility], [address], [place], [place] [address]    Special Requests   Final    BOTTLES DRAWN AEROBIC AND ANAEROBIC Blood Culture adequate volume Performed at [facility], [address] [place], [place] [address]    Culture   Final    NO GROWTH 5 DAYS Performed at [facility], [address], [place], [place] [address]    Report Status 05/09/2024 FINAL  Final    Labs: CBC: Recent Labs  Lab 05/04/24 1737 05/05/24 0011 05/05/24 0702  WBC 9.4  --  8.8  NEUTROABS  --   --  5.0   HGB 14.5 11.6* 12.8  HCT 41.2 34.0* 36.7  MCV 82.2  --  83.4  PLT 341  --  281   Basic Metabolic Panel: Recent Labs  Lab 05/04/24 1737 05/05/24 0011 05/05/24 0702 05/06/24 1720  NA 134* 140 139 137  K 3.6 3.6 3.6 4.0  CL 101  --  108 106  CO2 16*  --  21* 20*  GLUCOSE 166*  --  121* 137*  BUN 30*  --  20 13  CREATININE 1.20*  --  1.08* 0.98  CALCIUM  9.9  --  8.6* 8.9   Liver Function Tests: Recent Labs  Lab 05/05/24 0702  AST 24  ALT 28  ALKPHOS 75  BILITOT 0.8  PROT 6.5  ALBUMIN 3.7   CBG: Recent Labs  Lab 05/06/24 1649 05/06/24 2107 05/07/24 0603 05/07/24 0736 05/07/24 1107  GLUCAP 141* 126* 127* 120* 148*    Discharge time spent: 40 minutes.   Signed: Elgie Butter, MD [facility]

## 2024-05-17 DIAGNOSIS — E1165 Type 2 diabetes mellitus with hyperglycemia: Secondary | ICD-10-CM | POA: Diagnosis not present

## 2024-05-17 DIAGNOSIS — I10 Essential (primary) hypertension: Secondary | ICD-10-CM | POA: Diagnosis not present

## 2024-05-17 DIAGNOSIS — N1831 Chronic kidney disease, stage 3a: Secondary | ICD-10-CM | POA: Diagnosis not present

## 2024-05-17 DIAGNOSIS — M459 Ankylosing spondylitis of unspecified sites in spine: Secondary | ICD-10-CM | POA: Diagnosis not present

## 2024-05-17 DIAGNOSIS — E1122 Type 2 diabetes mellitus with diabetic chronic kidney disease: Secondary | ICD-10-CM | POA: Diagnosis not present

## 2024-05-24 ENCOUNTER — Telehealth (INDEPENDENT_AMBULATORY_CARE_PROVIDER_SITE_OTHER): Payer: Self-pay | Admitting: Otolaryngology

## 2024-05-24 DIAGNOSIS — Z91198 Patient's noncompliance with other medical treatment and regimen for other reason: Secondary | ICD-10-CM | POA: Diagnosis not present

## 2024-05-24 DIAGNOSIS — G4733 Obstructive sleep apnea (adult) (pediatric): Secondary | ICD-10-CM | POA: Diagnosis not present

## 2024-05-24 DIAGNOSIS — Z789 Other specified health status: Secondary | ICD-10-CM

## 2024-05-24 NOTE — Telephone Encounter (Signed)
 ENT Note: DISE performed at [facility] on 05/24/2024. AP collapse, trace lateral Will submit for [name]

## 2024-05-30 ENCOUNTER — Ambulatory Visit (INDEPENDENT_AMBULATORY_CARE_PROVIDER_SITE_OTHER): Admitting: Otolaryngology

## 2024-05-30 DIAGNOSIS — I10 Essential (primary) hypertension: Secondary | ICD-10-CM | POA: Diagnosis not present

## 2024-05-30 DIAGNOSIS — E1122 Type 2 diabetes mellitus with diabetic chronic kidney disease: Secondary | ICD-10-CM | POA: Diagnosis not present

## 2024-05-30 DIAGNOSIS — E039 Hypothyroidism, unspecified: Secondary | ICD-10-CM | POA: Diagnosis not present

## 2024-05-30 DIAGNOSIS — G4733 Obstructive sleep apnea (adult) (pediatric): Secondary | ICD-10-CM

## 2024-05-30 DIAGNOSIS — Z91198 Patient's noncompliance with other medical treatment and regimen for other reason: Secondary | ICD-10-CM | POA: Diagnosis not present

## 2024-05-30 DIAGNOSIS — E1165 Type 2 diabetes mellitus with hyperglycemia: Secondary | ICD-10-CM | POA: Diagnosis not present

## 2024-05-30 DIAGNOSIS — Z789 Other specified health status: Secondary | ICD-10-CM

## 2024-05-30 DIAGNOSIS — N1831 Chronic kidney disease, stage 3a: Secondary | ICD-10-CM | POA: Diagnosis not present

## 2024-06-06 ENCOUNTER — Encounter (HOSPITAL_COMMUNITY): Payer: Self-pay | Admitting: *Deleted

## 2024-06-06 ENCOUNTER — Other Ambulatory Visit: Payer: Self-pay

## 2024-06-06 NOTE — Anesthesia Preprocedure Evaluation (Signed)
 Anesthesia Evaluation  Patient identified by MRN, date of birth, ID band Patient awake    Reviewed: Allergy & Precautions, H&P , NPO status , Patient's Chart, lab work & pertinent test results  Airway Mallampati: II   Neck ROM: full    Dental   Pulmonary shortness of breath, asthma , former smoker   breath sounds clear to auscultation       Cardiovascular hypertension,  Rhythm:regular Rate:Normal     Neuro/Psych  Headaches, Seizures -,  PSYCHIATRIC DISORDERS Anxiety Depression     Neuromuscular disease    GI/Hepatic ,GERD  ,,  Endo/Other  diabetes, Type 2Hypothyroidism    Renal/GU      Musculoskeletal  (+) Arthritis ,  Fibromyalgia -  Abdominal   Peds  Hematology   Anesthesia Other Findings   Reproductive/Obstetrics                              Anesthesia Physical Anesthesia Plan  ASA: 2  Anesthesia Plan: General   Post-op Pain Management:    Induction: Intravenous  PONV Risk Score and Plan: 3 and Ondansetron , Dexamethasone , Midazolam  and Treatment may vary due to age or medical condition  Airway Management Planned: Video Laryngoscope Planned and Oral ETT  Additional Equipment:   Intra-op Plan:   Post-operative Plan: Extubation in OR  Informed Consent: I have reviewed the patients History and Physical, chart, labs and discussed the procedure including the risks, benefits and alternatives for the proposed anesthesia with the patient or authorized representative who has indicated his/her understanding and acceptance.     Dental advisory given  Plan Discussed with: CRNA, Anesthesiologist and Surgeon  Anesthesia Plan Comments: (PAT note by Lynwood Hope, PA-C: 58 year old female with pertinent history including HTN, IDDM2 (A1c 10.2 on 04/13/2024), hypothyroidism, migraines, ankylosing spondylitis (on Simponi ), sarcoidosis, GERD on PPI, OSA intolerant to CPAP, asthma, pancreatic  exocrine insufficiency.  Echo 12/08/2023 showed LVEF 60 to 65%, normal wall motion, normal RV, no significant valvular abnormalities.  Cardiac PET perfusion scan 10/2023 was low risk, nonischemic.  Patient was admitted 7/7 through 03/08/2024 for strokelike symptoms.  She did receive TNK.  Workup was benign.  CT head without acute abnormality, CTA head and neck no LVO, MRI unable to be done due to spinal cord stimulator.  Ultimately felt to be complicated migraine versus functional neurologic disorder.  She had similar admissions in 2016 x 3, in 02/2017, and in 11/2023 with benign evaluations which were also felt to be complicated migraine versus functional neurologic disorder.  She was recommended to continue 81 mg ASA daily.  Recent admission 9/4 through 05/07/2024 after presenting with tachycardia, intermittent chest pressure, and near syncope.  She was noted to have AKI with hyponatremia which normalized with hydration.  Workup was otherwise benign and symptoms were felt related to hypotensive episode.  HCTZ was discontinued and losartan  was reduced to 50 mg daily.  Metoprolol was continued.  Per discharge summary, EKG wnl. ACS ruled out. Patient had recent Patient also had PET/CT perfusion scan in February 2025 which was low risk.  Echo done in April 2025 showed preserved LVEF. Suspect a component of anxiety, added atarax .  Patient reports last dose of Mounjaro  05/23/2024.  She has a Administrator, Civil Service in place that is incompatible with MRI.  Patient will need day of surgery labs and evaluation.  EKG 05/06/2024: NSR.  Rate 78.  CTA chest 05/04/2024: IMPRESSION: 1. No pulmonary embolism. No acute  intrathoracic pathology identified. 2. Mild coronary artery calcification. 3. Stable pathologic mediastinal and hilar adenopathy likely related to the patient's underlying sarcoidosis. 4. Aortic atherosclerosis.  Carotid duplex 05/06/2024: Summary:  Right Carotid: The extracranial vessels  were near-normal with only minimal wall thickening or plaque.  Left Carotid: The extracranial vessels were near-normal with only minimal wallthickening or plaque.  Vertebrals: Bilateral vertebral arteries demonstrate antegrade flow.  Subclavians: Normal flow hemodynamics were seen in bilateral subclavian arteries.   TTE 12/08/2023: 1. Left ventricular ejection fraction, by estimation, is 60 to 65%. The  left ventricle has normal function. The left ventricle has no regional  wall motion abnormalities. Left ventricular diastolic parameters were  normal.  2. Right ventricular systolic function is normal. The right ventricular  size is not well visualized. Tricuspid regurgitation signal is inadequate  for assessing PA pressure.  3. The mitral valve is normal in structure. Trivial mitral valve  regurgitation. No evidence of mitral stenosis.  4. The aortic valve has an indeterminant number of cusps. Aortic valve  regurgitation is not visualized. No aortic stenosis is present.   Comparison(s): Prior images unable to be directly viewed, comparison made  by report only. No significant change from prior study.   Conclusion(s)/Recommendation(s): Normal biventricular function without  evidence of hemodynamically significant valvular heart disease. No  intracardiac source of embolism detected on this transthoracic study.  Consider a transesophageal echocardiogram to  exclude cardiac source of embolism if clinically indicated.   Cardiac PET CT 10/12/2023:   The study is normal. The study is low risk.   LV perfusion is normal. There is no evidence of ischemia. There is no evidence of infarction.   Rest left ventricular function is normal. Rest EF: 68%. Stress left ventricular function is normal. Stress EF: 71%. End diastolic cavity size is normal. End systolic cavity size is normal.   Myocardial blood flow was computed to be 0.77ml/g/min at rest and 2.11ml/g/min at stress. Global myocardial  blood flow reserve was 2.20 and was normal.   Coronary calcium  was present on the attenuation correction CT images. Moderate coronary calcifications were present. Coronary calcifications were present in the left anterior descending artery, left circumflex artery and right coronary artery distribution(s).   Electronically signed by Lonni Nanas, MD    )         Anesthesia Quick Evaluation

## 2024-06-06 NOTE — Progress Notes (Addendum)
 SDW CALL  Patient was given pre-op instructions over the phone. The opportunity was given for the patient to ask questions. No further questions asked. Patient verbalized understanding of instructions given.   PCP - [name], FNP Cardiologist - Delford Maude BROCKS, MD  PPM/ICD - denies Device Orders -  Rep Notified -   Chest x-ray - 05/04/24 EKG - 05/06/24 Stress Test - 05/06/24 ECHO - 12/08/23 Cardiac Cath - 10/16/16  Sleep Study - 02/02/2024 CPAP - could not tolerate  Fasting Blood Sugar - 150-180 Checks Blood Sugar -Has CGM  WHAT DO I DO ABOUT MY DIABETES MEDICATION?  Tirzepatide (Mounjaro ) should be held seven days prior to surgery. Pt reports last dose was Sept 23.   Do not take oral diabetes medicines (pills) the morning of surgery. Do not take Jardiance  the day of surgery. Pt reports her ast dose of Jardiance  was 10/6.  THE NIGHT BEFORE SURGERY, take 25 units of Toujeo  (glargine) insulin .      If your CBG is greater than 220 mg/dL the morning of surgery, you may take  of your sliding scale (correction) dose of Novolog  insulin . If your blood sugar is less than 220 do not take any Novolog  insulin  the day of surgery.    Check your blood sugar the morning of your surgery when you wake up and every 2 hours until you get to the Short Stay unit.  If your blood sugar is less than 70 mg/dL, you will need to treat for low blood sugar: Do not take insulin . Treat a low blood sugar (less than 70 mg/dL) with  cup of clear juice (cranberry or apple), 4 glucose tablets, OR glucose gel. Recheck blood sugar in 15 minutes after treatment (to make sure it is greater than 70 mg/dL). If your blood sugar is not greater than 70 mg/dL on recheck, call [phone] for further instructions. Report your blood sugar to the short stay nurse when you get to Short Stay.   Blood Thinner Instructions:na Aspirin  Instructions:per Dr. Anthony instructions, hold Aspirin  7 days prior to surgery.Patient  reports her last dose of aspirin  was 9/29.  ERAS Protcol -clear liquids until 1000. PRE-SURGERY Ensure or G2- no  COVID TEST- na   Anesthesia review: yes-history of DM,HTN,palpitations,seizures-has spinal cord stimulator,hospitalized 9/4-9/7 for syncope;hx astma,sarcoidosis.   Patient denies shortness of breath, fever, cough and chest pain over the phone call     As of today, STOP taking any Aspirin  (unless otherwise instructed by your surgeon) Aleve , Naproxen , Ibuprofen , Motrin , Advil , Goody's, BC's, all herbal medications, fish oil, and all vitamins. No Celebrex .    Special instructions:    Oral Hygiene is also important to reduce your risk of infection.  Remember - BRUSH YOUR TEETH THE MORNING OF SURGERY WITH YOUR REGULAR TOOTHPASTE   Please bring remote for spinal cord stimulator to the hospital on the day of surgery.

## 2024-06-06 NOTE — Progress Notes (Signed)
 Anesthesia Chart Review:  58 year old female with pertinent history including HTN, IDDM2 (A1c 10.2 on 04/13/2024), hypothyroidism, migraines, ankylosing spondylitis (on Simponi ), sarcoidosis, GERD on PPI, OSA intolerant to CPAP, asthma, pancreatic exocrine insufficiency.  Echo 12/08/2023 showed LVEF 60 to 65%, normal wall motion, normal RV, no significant valvular abnormalities.  Cardiac PET perfusion scan 10/2023 was low risk, nonischemic.  Patient was admitted 7/7 through 03/08/2024 for strokelike symptoms.  She did receive TNK.  Workup was benign.  CT head without acute abnormality, CTA head and neck no LVO, MRI unable to be done due to spinal cord stimulator.  Ultimately felt to be complicated migraine versus functional neurologic disorder.  She had similar admissions in 2016 x 3, in 02/2017, and in 11/2023 with benign evaluations which were also felt to be complicated migraine versus functional neurologic disorder.  She was recommended to continue 81 mg ASA daily.  Recent admission 9/4 through 05/07/2024 after presenting with tachycardia, intermittent chest pressure, and near syncope.  She was noted to have AKI with hyponatremia which normalized with hydration.  Workup was otherwise benign and symptoms were felt related to hypotensive episode.  HCTZ was discontinued and losartan  was reduced to 50 mg daily.  Metoprolol was continued.  Per discharge summary, EKG wnl. ACS ruled out. Patient had recent Patient also had PET/CT perfusion scan in February 2025 which was low risk.  Echo done in April 2025 showed preserved LVEF. Suspect a component of anxiety, added atarax .  Patient reports last dose of Mounjaro  05/23/2024.  She has a Administrator, Civil Service in place that is incompatible with MRI.  Patient will need day of surgery labs and evaluation.  EKG 05/06/2024: NSR.  Rate 78.  CTA chest 05/04/2024: IMPRESSION: 1. No pulmonary embolism. No acute intrathoracic pathology identified. 2. Mild  coronary artery calcification. 3. Stable pathologic mediastinal and hilar adenopathy likely related to the patient's underlying sarcoidosis. 4. Aortic atherosclerosis.  Carotid duplex 05/06/2024: Summary:  Right Carotid: The extracranial vessels were near-normal with only minimal wall thickening or plaque.  Left Carotid: The extracranial vessels were near-normal with only minimal wall thickening or plaque.  Vertebrals:  Bilateral vertebral arteries demonstrate antegrade flow.  Subclavians: Normal flow hemodynamics were seen in bilateral subclavian arteries.   TTE 12/08/2023:  1. Left ventricular ejection fraction, by estimation, is 60 to 65%. The  left ventricle has normal function. The left ventricle has no regional  wall motion abnormalities. Left ventricular diastolic parameters were  normal.   2. Right ventricular systolic function is normal. The right ventricular  size is not well visualized. Tricuspid regurgitation signal is inadequate  for assessing PA pressure.   3. The mitral valve is normal in structure. Trivial mitral valve  regurgitation. No evidence of mitral stenosis.   4. The aortic valve has an indeterminant number of cusps. Aortic valve  regurgitation is not visualized. No aortic stenosis is present.   Comparison(s): Prior images unable to be directly viewed, comparison made  by report only. No significant change from prior study.   Conclusion(s)/Recommendation(s): Normal biventricular function without  evidence of hemodynamically significant valvular heart disease. No  intracardiac source of embolism detected on this transthoracic study.  Consider a transesophageal echocardiogram to  exclude cardiac source of embolism if clinically indicated.   Cardiac PET CT 10/12/2023:   The study is normal. The study is low risk.   LV perfusion is normal. There is no evidence of ischemia. There is no evidence of infarction.   Rest left ventricular function  is normal. Rest EF: 68%.  Stress left ventricular function is normal. Stress EF: 71%. End diastolic cavity size is normal. End systolic cavity size is normal.   Myocardial blood flow was computed to be 0.74ml/g/min at rest and 2.11ml/g/min at stress. Global myocardial blood flow reserve was 2.20 and was normal.   Coronary calcium  was present on the attenuation correction CT images. Moderate coronary calcifications were present. Coronary calcifications were present in the left anterior descending artery, left circumflex artery and right coronary artery distribution(s).   Electronically signed by Lonni Nanas, MD    Lynwood Hope, PA-C [facility]/Anesthesiology Phone [phone] 06/06/2024 12:28 PM

## 2024-06-07 ENCOUNTER — Other Ambulatory Visit: Payer: Self-pay

## 2024-06-07 ENCOUNTER — Ambulatory Visit (HOSPITAL_COMMUNITY)

## 2024-06-07 ENCOUNTER — Ambulatory Visit (HOSPITAL_COMMUNITY): Payer: Self-pay | Admitting: Physician Assistant

## 2024-06-07 ENCOUNTER — Encounter (HOSPITAL_COMMUNITY)
Admission: RE | Disposition: A | Discharge status: Home / Self Care | Payer: Self-pay | Source: Home / Self Care | Attending: Otolaryngology

## 2024-06-07 ENCOUNTER — Ambulatory Visit (HOSPITAL_COMMUNITY)
Admission: RE | Admit: 2024-06-07 | Discharge: 2024-06-07 | Disposition: A | Discharge status: Home / Self Care | Attending: Otolaryngology | Admitting: Otolaryngology

## 2024-06-07 ENCOUNTER — Encounter (HOSPITAL_COMMUNITY): Payer: Self-pay

## 2024-06-07 DIAGNOSIS — E119 Type 2 diabetes mellitus without complications: Secondary | ICD-10-CM | POA: Diagnosis not present

## 2024-06-07 DIAGNOSIS — R2990 Unspecified symptoms and signs involving the nervous system: Secondary | ICD-10-CM

## 2024-06-07 DIAGNOSIS — E039 Hypothyroidism, unspecified: Secondary | ICD-10-CM | POA: Insufficient documentation

## 2024-06-07 DIAGNOSIS — M1990 Unspecified osteoarthritis, unspecified site: Secondary | ICD-10-CM | POA: Insufficient documentation

## 2024-06-07 DIAGNOSIS — I10 Essential (primary) hypertension: Secondary | ICD-10-CM | POA: Diagnosis not present

## 2024-06-07 DIAGNOSIS — Z6829 Body mass index (BMI) 29.0-29.9, adult: Secondary | ICD-10-CM | POA: Insufficient documentation

## 2024-06-07 DIAGNOSIS — E663 Overweight: Secondary | ICD-10-CM | POA: Insufficient documentation

## 2024-06-07 DIAGNOSIS — G4733 Obstructive sleep apnea (adult) (pediatric): Secondary | ICD-10-CM | POA: Diagnosis not present

## 2024-06-07 DIAGNOSIS — J45909 Unspecified asthma, uncomplicated: Secondary | ICD-10-CM | POA: Diagnosis not present

## 2024-06-07 DIAGNOSIS — Z91198 Patient's noncompliance with other medical treatment and regimen for other reason: Secondary | ICD-10-CM | POA: Diagnosis not present

## 2024-06-07 DIAGNOSIS — Z87891 Personal history of nicotine dependence: Secondary | ICD-10-CM | POA: Diagnosis not present

## 2024-06-07 DIAGNOSIS — Z9889 Other specified postprocedural states: Secondary | ICD-10-CM | POA: Diagnosis not present

## 2024-06-07 DIAGNOSIS — K219 Gastro-esophageal reflux disease without esophagitis: Secondary | ICD-10-CM | POA: Insufficient documentation

## 2024-06-07 DIAGNOSIS — R918 Other nonspecific abnormal finding of lung field: Secondary | ICD-10-CM | POA: Diagnosis not present

## 2024-06-07 DIAGNOSIS — M797 Fibromyalgia: Secondary | ICD-10-CM | POA: Insufficient documentation

## 2024-06-07 DIAGNOSIS — I517 Cardiomegaly: Secondary | ICD-10-CM | POA: Diagnosis not present

## 2024-06-07 HISTORY — DX: Type 2 diabetes mellitus without complications: E11.9

## 2024-06-07 HISTORY — PX: IMPLANTATION OF HYPOGLOSSAL NERVE STIMULATOR: SHX6827

## 2024-06-07 LAB — GLUCOSE, CAPILLARY
Glucose-Capillary: 144 mg/dL — ABNORMAL HIGH (ref 70–99)
Glucose-Capillary: 157 mg/dL — ABNORMAL HIGH (ref 70–99)

## 2024-06-07 SURGERY — INSERTION, HYPOGLOSSAL NERVE STIMULATOR
Anesthesia: General | Site: Neck | Laterality: Right

## 2024-06-07 MED ORDER — FENTANYL CITRATE (PF) 100 MCG/2ML IJ SOLN
INTRAMUSCULAR | Status: AC
  Filled 2024-06-07: qty 2

## 2024-06-07 MED ORDER — ORAL CARE MOUTH RINSE: 15.0000 mL | Freq: Once | OROMUCOSAL | Status: AC

## 2024-06-07 MED ORDER — SUCCINYLCHOLINE CHLORIDE 200 MG/10ML IV SOSY
PREFILLED_SYRINGE | INTRAVENOUS | Status: DC | PRN
  Administered 2024-06-07: 120 mg via INTRAVENOUS

## 2024-06-07 MED ORDER — OXYCODONE HCL 5 MG PO TABS
5.0000 mg | ORAL_TABLET | Freq: Once | ORAL | Status: AC
  Administered 2024-06-07: 5 mg via ORAL

## 2024-06-07 MED ORDER — OXYCODONE HCL 5 MG PO TABS: 5.0000 mg | ORAL_TABLET | ORAL | 0 refills | Status: AC | PRN

## 2024-06-07 MED ORDER — DEXAMETHASONE SODIUM PHOSPHATE 10 MG/ML IJ SOLN
INTRAMUSCULAR | Status: AC
  Filled 2024-06-07: qty 1

## 2024-06-07 MED ORDER — GLYCOPYRROLATE PF 0.2 MG/ML IJ SOSY
PREFILLED_SYRINGE | INTRAMUSCULAR | Status: DC | PRN
  Administered 2024-06-07: .2 mg via INTRAVENOUS

## 2024-06-07 MED ORDER — ONDANSETRON HCL 4 MG/2ML IJ SOLN
INTRAMUSCULAR | Status: DC | PRN
  Administered 2024-06-07: 4 mg via INTRAVENOUS

## 2024-06-07 MED ORDER — PHENYLEPHRINE HCL-NACL 20-0.9 MG/250ML-% IV SOLN: INTRAVENOUS | Status: DC | PRN

## 2024-06-07 MED ORDER — 0.9 % SODIUM CHLORIDE (POUR BTL) OPTIME
TOPICAL | Status: DC | PRN
  Administered 2024-06-07: 1000 mL

## 2024-06-07 MED ORDER — SODIUM CHLORIDE 0.9 % IV SOLN
Freq: Once | INTRAVENOUS | Status: AC
  Administered 2024-06-07: 500 mL
  Filled 2024-06-07: qty 10

## 2024-06-07 MED ORDER — LIDOCAINE 2% (20 MG/ML) 5 ML SYRINGE
INTRAMUSCULAR | Status: DC | PRN
  Administered 2024-06-07: 60 mg via INTRAVENOUS

## 2024-06-07 MED ORDER — FENTANYL CITRATE (PF) 250 MCG/5ML IJ SOLN
INTRAMUSCULAR | Status: AC
  Filled 2024-06-07: qty 5

## 2024-06-07 MED ORDER — LIDOCAINE-EPINEPHRINE 1 %-1:100000 IJ SOLN
INTRAMUSCULAR | Status: DC | PRN
  Administered 2024-06-07: 8 mL

## 2024-06-07 MED ORDER — OXYCODONE HCL 5 MG PO TABS
ORAL_TABLET | ORAL | Status: AC
  Filled 2024-06-07: qty 1

## 2024-06-07 MED ORDER — DEXAMETHASONE SODIUM PHOSPHATE 10 MG/ML IJ SOLN
INTRAMUSCULAR | Status: DC | PRN
  Administered 2024-06-07: 10 mg via INTRAVENOUS

## 2024-06-07 MED ORDER — FENTANYL CITRATE (PF) 100 MCG/2ML IJ SOLN
25.0000 ug | INTRAMUSCULAR | Status: DC | PRN
  Administered 2024-06-07 (×3): 50 ug via INTRAVENOUS

## 2024-06-07 MED ORDER — INSULIN ASPART 100 UNIT/ML IJ SOLN
0.0000 [IU] | INTRAMUSCULAR | Status: DC | PRN
  Administered 2024-06-07: 2 [IU] via SUBCUTANEOUS
  Filled 2024-06-07: qty 1

## 2024-06-07 MED ORDER — ONDANSETRON HCL 4 MG/2ML IJ SOLN
INTRAMUSCULAR | Status: AC
  Filled 2024-06-07: qty 2

## 2024-06-07 MED ORDER — PROPOFOL 10 MG/ML IV BOLUS
INTRAVENOUS | Status: DC | PRN
  Administered 2024-06-07: 200 mg via INTRAVENOUS
  Administered 2024-06-07: 50 ug/kg/min via INTRAVENOUS
  Administered 2024-06-07: 40 mg via INTRAVENOUS

## 2024-06-07 MED ORDER — CEFAZOLIN SODIUM-DEXTROSE 2-3 GM-%(50ML) IV SOLR
INTRAVENOUS | Status: DC | PRN
  Administered 2024-06-07: 2 g via INTRAVENOUS

## 2024-06-07 MED ORDER — PHENYLEPHRINE HCL-NACL 20-0.9 MG/250ML-% IV SOLN
INTRAVENOUS | Status: DC | PRN
  Administered 2024-06-07: 160 ug via INTRAVENOUS
  Administered 2024-06-07: 120 ug via INTRAVENOUS
  Administered 2024-06-07: 80 ug via INTRAVENOUS
  Administered 2024-06-07 (×2): 120 ug via INTRAVENOUS

## 2024-06-07 MED ORDER — CEPHALEXIN 500 MG PO CAPS: 500.0000 mg | ORAL_CAPSULE | Freq: Two times a day (BID) | ORAL | 0 refills | Status: AC

## 2024-06-07 MED ORDER — ACETAMINOPHEN 500 MG PO TABS: 1000.0000 mg | ORAL_TABLET | Freq: Four times a day (QID) | ORAL | 2 refills | Status: AC | PRN

## 2024-06-07 MED ORDER — MIDAZOLAM HCL 2 MG/2ML IJ SOLN
INTRAMUSCULAR | Status: DC | PRN
  Administered 2024-06-07: 2 mg via INTRAVENOUS

## 2024-06-07 MED ORDER — FENTANYL CITRATE (PF) 250 MCG/5ML IJ SOLN
INTRAMUSCULAR | Status: DC | PRN
Start: 2024-06-07 — End: 2024-06-07
  Administered 2024-06-07: 25 ug via INTRAVENOUS
  Administered 2024-06-07: 50 ug via INTRAVENOUS
  Administered 2024-06-07: 75 ug via INTRAVENOUS

## 2024-06-07 MED ORDER — ONDANSETRON HCL 4 MG/2ML IJ SOLN: 4.0000 mg | Freq: Four times a day (QID) | INTRAMUSCULAR | Status: DC | PRN

## 2024-06-07 MED ORDER — MIDAZOLAM HCL 2 MG/2ML IJ SOLN
INTRAMUSCULAR | Status: AC
Start: 2024-06-07 — End: 2024-06-07
  Filled 2024-06-07: qty 2

## 2024-06-07 MED ORDER — CHLORHEXIDINE GLUCONATE 0.12 % MT SOLN
15.0000 mL | Freq: Once | OROMUCOSAL | Status: AC
  Administered 2024-06-07: 15 mL via OROMUCOSAL
  Filled 2024-06-07: qty 15

## 2024-06-07 MED ORDER — EPHEDRINE SULFATE-NACL 50-0.9 MG/10ML-% IV SOSY
PREFILLED_SYRINGE | INTRAVENOUS | Status: DC | PRN
  Administered 2024-06-07: 10 mg via INTRAVENOUS
  Administered 2024-06-07: 7.5 mg via INTRAVENOUS

## 2024-06-07 MED ORDER — ROCURONIUM BROMIDE 10 MG/ML (PF) SYRINGE
PREFILLED_SYRINGE | INTRAVENOUS | Status: AC
  Filled 2024-06-07: qty 10

## 2024-06-07 MED ORDER — LACTATED RINGERS IV SOLN: INTRAVENOUS | Status: DC

## 2024-06-07 MED ORDER — LIDOCAINE-EPINEPHRINE 1 %-1:100000 IJ SOLN
INTRAMUSCULAR | Status: AC
Start: 2024-06-07 — End: 2024-06-07
  Filled 2024-06-07: qty 1

## 2024-06-07 SURGICAL SUPPLY — 46 items
BLADE SURG 15 STRL LF DISP TIS (BLADE) ×1 IMPLANT
CORD BIPOLAR FORCEPS 12FT (ELECTRODE) ×1 IMPLANT
COVER PROBE W GEL 5X96 (DRAPES) ×1 IMPLANT
COVER SURGICAL LIGHT HANDLE (MISCELLANEOUS) ×1 IMPLANT
DERMABOND ADVANCED .7 DNX12 (GAUZE/BANDAGES/DRESSINGS) ×1 IMPLANT
DRAPE HEAD BAR (DRAPES) ×1 IMPLANT
DRAPE INCISE IOBAN 66X45 STRL (DRAPES) ×1 IMPLANT
DRAPE UTILITY XL STRL (DRAPES) ×1 IMPLANT
DRSG TEGADERM 4X4.75 (GAUZE/BANDAGES/DRESSINGS) ×3 IMPLANT
ELECT COATED BLADE 2.86 ST (ELECTRODE) ×1 IMPLANT
ELECTRODE EMG 18 NIMS (NEUROSURGERY SUPPLIES) ×1 IMPLANT
ELECTRODE REM PT RTRN 9FT ADLT (ELECTROSURGICAL) ×1 IMPLANT
GAUZE 4X4 16PLY ~~LOC~~+RFID DBL (SPONGE) ×1 IMPLANT
GENERATOR PULSE INSPIRE V SENS (Generator) ×1 IMPLANT
GLOVE BIO SURGEON STRL SZ7.5 (GLOVE) ×1 IMPLANT
GOWN STRL REUS W/ TWL LRG LVL3 (GOWN DISPOSABLE) ×3 IMPLANT
KIT BASIN OR (CUSTOM PROCEDURE TRAY) ×1 IMPLANT
KIT NEURO ACCESSORY W/WRENCH (MISCELLANEOUS) IMPLANT
KIT TURNOVER KIT B (KITS) ×1 IMPLANT
LEAD SLEEP STIM INSPIRE IV/V (Lead) ×1 IMPLANT
LOOP VASCLR MAXI BLUE 18IN ST (MISCELLANEOUS) ×1 IMPLANT
MARKER SKIN DUAL TIP RULER LAB (MISCELLANEOUS) ×2 IMPLANT
NDL HYPO 25GX1X1/2 BEV (NEEDLE) ×1 IMPLANT
NEEDLE HYPO 25GX1X1/2 BEV (NEEDLE) ×1 IMPLANT
PAD ARMBOARD POSITIONER FOAM (MISCELLANEOUS) ×1 IMPLANT
PASSER CATH 38CM DISP (INSTRUMENTS) ×1 IMPLANT
PENCIL SMOKE EVACUATOR (MISCELLANEOUS) ×1 IMPLANT
POSITIONER HEAD DONUT 9IN (MISCELLANEOUS) ×1 IMPLANT
PROBE NERVE STIMULATOR (NEUROSURGERY SUPPLIES) ×1 IMPLANT
REMOTE CONTROL SLEEP INSPIRE (MISCELLANEOUS) ×1 IMPLANT
SET WALTER ACTIVATION W/DRAPE (SET/KITS/TRAYS/PACK) ×1 IMPLANT
SOLN 0.9% NACL 1000 ML (IV SOLUTION) ×1 IMPLANT
SOLN 0.9% NACL POUR BTL 1000ML (IV SOLUTION) ×1 IMPLANT
SPONGE INTESTINAL PEANUT (DISPOSABLE) ×1 IMPLANT
SUT MNCRL AB 4-0 PS2 18 (SUTURE) IMPLANT
SUT MON AB 5-0 PS2 18 (SUTURE) IMPLANT
SUT SILK 2 0 SH (SUTURE) ×1 IMPLANT
SUT SILK 2 0 SH CR/8 (SUTURE) IMPLANT
SUT VIC AB 3-0 SH 18 (SUTURE) IMPLANT
SUT VIC AB 3-0 SH 27X BRD (SUTURE) ×2 IMPLANT
SUT VIC AB 4-0 RB1 18 (SUTURE) IMPLANT
SUTURE SILK 3-0 RB1 30XBRD (SUTURE) ×1 IMPLANT
SYR 10ML LL (SYRINGE) ×1 IMPLANT
TOWEL GREEN STERILE (TOWEL DISPOSABLE) ×1 IMPLANT
TRAY ENT MC OR (CUSTOM PROCEDURE TRAY) ×1 IMPLANT
VASCULAR TIE MINI RED 18IN STL (MISCELLANEOUS) ×1 IMPLANT

## 2024-06-07 NOTE — Transfer of Care (Signed)
 Immediate Anesthesia Transfer of Care Note  Patient: [name]  Procedure(s) Performed: INSERTION, HYPOGLOSSAL NERVE STIMULATOR (Right: Neck)  Patient Location: PACU  Anesthesia Type:General  Level of Consciousness: awake and alert   Airway & Oxygen Therapy: Patient Spontanous Breathing and Patient connected to face mask oxygen  Post-op Assessment: Report given to RN and Post -op Vital signs reviewed and stable  Post vital signs: Reviewed and stable  Last Vitals:  Vitals Value Taken Time  BP 159/95 06/07/24 15:18  Temp    Pulse 111 06/07/24 15:19  Resp 15 06/07/24 15:19  SpO2 92 % 06/07/24 15:19  Vitals shown include unfiled device data.  Last Pain:  Vitals:   06/07/24 1053  TempSrc:   PainSc: 4       Patients Stated Pain Goal: 1 (06/07/24 1053)  Complications: No notable events documented.

## 2024-06-07 NOTE — Discharge Instructions (Signed)
 Post Anesthesia Guidelines  During recovery from anesthesia  You may feel drowsy and reflexes may be slowed for 24 hours:  -Do not drive, use machinery, appliances, ride bicycles or scooters  -Do not consume alcohol  -Do not make important decisions   Eating and drinking:  -Drink plenty of liquids today  -Avoid fried or spicy foods today  -Return to your regular diet slowly over the next 24 hours  -Please call if unable to keep fluids down  If your throat is sore: -A breathing tube may have been used during your surgery -Drink cool liquids or gargle with warm salt water  -If soreness lasts more than a few days, please call us   Surgery Discharge Instructions:  Call clinic or return to ED if you: - develop a fever greater than 101.4 - have shaking chills or are feeling ill - become short of breath - have uncontrollable nausea or vomiting - can't hold down food or liquids or feel as though you are getting dehydrated - have significant leakage or drainage from wound - urine output of less than 30cc/hr for 12 hours - develop significant redness, pain at incision(s) or wound opens up/separates - any other acute events, problems, or concerns  Wound Care/Dressings/Drain Instructions:  - To take care of your incision/cuts on neck and chest: Do not apply any ointment. - It is ok to shower in 48 hours, but avoid rubbing the incision or submerging under water  like in a bath or swimming pool. Gently pat dry the incision. - Your stitches are absorbable and will dissolve on their own  - In 48 hours, do gentle neck rolls (5 rolls twice per day) to prevent any tethering of the neck  Medications: - Resume your regular home medications except as detailed in the medication reconciliation.  - For pain, take tylenol  1000mg  every 6 hours. Avoid NSAIDs like ibuprofen  or aleeve or motrin . If that is not sufficient, a stronger pain medication has been prescribed to you (oxycodone  5mg  tablet every 4-6  hours). Do not mix with any other narcotic medication such as the tramadol  you were prescribed recently. -Resume your aspirin  81mg  1 week after surgery - Take keflex 500mg  tablet twice daily for 7 days to prevent infection  Follow Up:  - A follow up appointment should be scheduled for you after discharge. However, If you do not hear about your appoinment in 3 business days, please call the provided surgery clinic number and confirm/schedule your appointment.    Activity/Restrictions:  - Resume your regular activities, as tolerated. Avoid heavy lifting or straining (more than 5 lbs) for 10 days.  Diet: - Resume your regular diet, as tolerated  Additional Instructions: - Please take an over the counter stool softener while taking narcotic pain medication - DO NOT MIX NARCOTIC PAIN MEDICATIONS OR TAKE NARCOTIC PRESCRIPTIONS AT THE SAME TIME - DO NOT DRIVE OR OPERATE HEAVY MACHINERY WHILE ON NARCOTICS  - DO NOT TAKE MORE THAN 4 GRAMS (4000mg ) OF TYLENOL  (ACETAMINOPHEN ) IN 24 HOURS  Department of Otolaryngology Contact Info: Otolaryngology Front Desk Phone including questions about appointments or questions: [phone] - If after normal business hours (Monday-Friday after 5PM or Weekends/Holidays), please call same number and follow prompts for Patient Access Line. There is a physician on call for urgent matters. For life threatening emergencies, please call 911

## 2024-06-07 NOTE — Anesthesia Procedure Notes (Signed)
 Procedure Name: Intubation Date/Time: 06/07/2024 12:41 PM  Performed by: Delores Duwaine SAUNDERS, CRNAPre-anesthesia Checklist: Patient identified, Emergency Drugs available, Suction available and Patient being monitored Patient Re-evaluated:Patient Re-evaluated prior to induction Oxygen Delivery Method: Circle System Utilized Preoxygenation: Pre-oxygenation with 100% oxygen Induction Type: IV induction Ventilation: Mask ventilation without difficulty Laryngoscope Size: Mac and 3 Grade View: Grade I Tube type: Oral Tube size: 7.0 mm Number of attempts: 1 Airway Equipment and Method: Stylet and Oral airway Placement Confirmation: ETT inserted through vocal cords under direct vision, positive ETCO2 and breath sounds checked- equal and bilateral Secured at: 22 cm Tube secured with: Tape Dental Injury: Teeth and Oropharynx as per pre-operative assessment

## 2024-06-07 NOTE — H&P (Signed)
 Pre-Operative H&P - Day Of Surgery Patient Name: [name] Date:   06/07/2024  HPI: [name] is a 58 y.o. female who presents today for operative treatment of obstructive sleep apnea, intolerance of continuous positive airway pressure ventilation. Patient denies recent significant changes to health or significant new medications or physiologic change in condition which would immediately impact plans. No new types of therapy has been initiated that would change the plan or the appropriateness of the plan.   ROS:  A complete review of systems was obtained and is otherwise negative.   PMH:  Past Medical History:  Diagnosis Date   Ankylosing spondylitis (HCC)    Anxiety    Arthritis    back, hands, neck (09/18/2014)   Asthma    Atypical chest pain    Chronic back pain    whole spine   Chronic bronchitis (HCC)    get it close to q yr (09/18/2014)   Chronic pain    Complicated migraine    Depression    Diabetes mellitus without complication (HCC)    Environmental allergies    Erosion of suburethral sling    Fibromyalgia    Frequency of urination    GERD (gastroesophageal reflux disease)    Headache    at least several times/wk (09/18/2014)   Heart murmur    when I was a baby   Herpes simplex type 2 infection    History of angina    HLA B27 positive    Hypertension    Hypothyroidism    IBS (irritable bowel syndrome)    IC (interstitial cystitis)    Lesion of frontal lobe of brain    Migraine    maybe once/month (09/18/2014)   Nocturia    NSVD (normal spontaneous vaginal delivery)    x3   Osteoarthritis    Pneumonia    RLS (restless legs syndrome)    Sarcoid    Seizures (HCC)    when I was a teenager   Shortness of breath dyspnea    Exertion   SUI (stress urinary incontinence, female)    Thyroid  disease     PSH:  Past Surgical History:  Procedure Laterality Date   ABDOMINAL HYSTERECTOMY  2006   ANTERIOR CERVICAL DECOMP/DISCECTOMY FUSION  2011    APPENDECTOMY  1985   ARTHROPLASTY     Left thumb   BACK SURGERY  2008   X 2 in 2010   BACK SURGERY     CESAREAN SECTION  2000   CRANIECTOMY FOR DEPRESSED SKULL FRACTURE Left 12/05/2014   Procedure: Left frontal stereotactic craniectomy for biopsy of skull lesion;  Surgeon: Gerldine Maizes, MD;  Location: [facility];  Service: Neurosurgery;  Laterality: Left;  Left frontal stereotactic craniectomy for biopsy of skull lesion   CYSTO WITH HYDRODISTENSION N/A 11/30/2012   Procedure: CYSTOSCOPY/HYDRODISTENSION;  Surgeon: Alm GORMAN Fragmin, MD;  Location: [facility];  Service: Urology;  Laterality: N/A;   KNEE ARTHROSCOPY Right    LUMBAR DISC SURGERY  02-02-2007   RIGHT SIDE L5 -- S1   LUMBAR FUSION  12-04-2008   L4  -- S1   LYNX RETROPUBIC SUBURETHRAL SLING  03-02-2007   MASS EXCISION Left 03/19/2017   Procedure: EXCISION LEFT WRIST VOLAR MASS;  Surgeon: Sissy Cough, MD;  Location: [facility] OR;  Service: Orthopedics;  Laterality: Left;   MEDIASTINOSCOPY N/A 10/26/2014   Procedure: MEDIASTINOSCOPY;  Surgeon: Elspeth JAYSON Millers, MD;  Location: [facility] OR;  Service: Thoracic;  Laterality: N/A;   NECK  SURGERY     PELVIC LAPAROSCOPY  1990's   LYSIS ADHESIONS   PUBOVAGINAL SLING N/A 11/30/2012   Procedure: CARLOYN GLADE;  Surgeon: Alm GORMAN Fragmin, MD;  Location: [facility];  Service: Urology;  Laterality: N/A;  1 HR EXAM UNDER ANESTHESIA, EXCISION OF SUB URETHRAL MESH, CYSTO, HOD    SPINAL CORD STIMULATOR IMPLANT     TONSILLECTOMY  1986   TUBAL LIGATION  2001   hulka clip   VIDEO BRONCHOSCOPY WITH ENDOBRONCHIAL ULTRASOUND N/A 09/21/2014   Procedure: VIDEO BRONCHOSCOPY WITH ENDOBRONCHIAL ULTRASOUND;  Surgeon: Lamar GORMAN Chris, MD;  Location: [facility] OR;  Service: Thoracic;  Laterality: N/A;    MEDS:   Current Facility-Administered Medications:    insulin  aspart (novoLOG ) injection 0-14 Units, 0-14 Units, Subcutaneous, Q2H PRN, Hodierne, Adam, MD, 2 Units at 06/07/24  1114   lactated ringers  infusion, , Intravenous, Continuous, Hodierne, Adam, MD, Last Rate: 10 mL/hr at 06/07/24 1110, New Bag at 06/07/24 1110  ALLERGIES: Lyrica [pregabalin], Topiramate , Humira [adalimumab], Infliximab , Percocet [oxycodone -acetaminophen ], Sulfamethoxazole-trimethoprim, and Vicodin [hydrocodone -acetaminophen ]  EXAM: Vitals: BP (!) 134/94   Pulse 77   Temp 98.6 F (37 C) (Oral)   Resp 20   Ht 5' 6 (1.676 m)   Wt 83.9 kg   LMP 06/03/2012   SpO2 97%   BMI 29.86 kg/m   General Awake, at baseline alertness.   HEENT No scleral icterus or conjunctival hemorrhage. Globe position appears normal. External ears  normal. Nose patent without rhinorrhea. No lymphadenopathy. No thyromegaly  Cardiovascular No cyanosis.  Pulmonary No audible stridor. Breathing easily with no labor.  Neuro Symmetric facial movement.   Psychiatry Appropriate affect and mood.  Skin No scars or lesions on face or neck.  Extermities Moves all extremities with normal range of motion.   Other Findings None.   Assessment & Plan: [name] has diagnoses of obstructive sleep apnea, intolerance of continuous positive airway pressure ventilation and will go to the OR today for right hypoglossal nerve stimulator placement. Informed consent was obtained and available in EMR today. All questions have been answered, and risks/benefits/alternatives of procedure as noted in the consent were discussed in a quiet area. Questions were invited and answered. The patient expressed understanding, provided consent and wished to proceed despite risks.  [name] 06/07/2024 12:06 PM

## 2024-06-07 NOTE — Op Note (Signed)
 Otolaryngology Operative note  [name] Date/Time of Admission: 06/07/2024 10:17 AM  CSN: [id number];MRN:[id number]  DOB: 1966-05-05 Age: 58 y.o. Location: [facility] OR    Pre-Op Diagnosis: Moderate to Severe Obstructive Sleep Apnea (ICD-10 G47.33) Intolerance of Continuous Positive Airway Pressure Ventilation  Post-Op Diagnosis: Same  Procedure: Right 12th cranial nerve (hypoglossal) stimulation implant including placement of neurostimulator with integrated respiratory sensor, and stimulation lead (CPT [phone]).  Surgeon: Eldora Blanch, MD  Anesthesia type:  General  Anesthesiologist: Anesthesiologist: Maryclare Cornet, MD CRNA: Delores Duwaine SAUNDERS, CRNA; Boyce Shilling, CRNA   Staff: Circulator: Henry Lauraine PARAS, RN Relief Circulator: Gerome Verla SAILOR, RN Scrub Person: Johnston Cram, Delon, RN; Orvil Asberry POUR Vendor Representative : Celena Rollo Candida Damien RN First Assistant: Bridgett Waddell PARAS, RN  Implants: Implant Name Type Inv. Item Serial No. Manufacturer Lot No. LRB No. Used Action  LEAD SLEEP STIM INSPIRE IV/V - [id number] Lead LEAD SLEEP STIM INSPIRE IV/V [id number] [employer]  Right 1 Implanted  GENERATOR PULSE INSPIRE V SENS - [id number] Generator GENERATOR PULSE INSPIRE V SENS [id number] [employer]  Right 1 Implanted    Specimens: None  EBL: 25cc  Drains: None  Post-op disposition and condition: PACU, hemodynamically stable   Findings:  Normal neck and chest anatomy with successful implantation of hypoglossal stimulator. Diagnostic evaluation confirmed an appropriate respiration sensing signal as well as activation of the genioglossus nerve, resulting in genioglossal activation and tongue protrusion, confirmed visually.   Complications: None apparent  Indications and consent:  [name] is a 58 y.o. female with history of moderate to severe obstructive sleep apnea with a BMI of 29.8 and intolerance of  continuous positive airway pressure ventilation therapy. Patient has passed the clinical, polysomnographic and endoscopic screening criteria for implantation. The patient's options were discussed, including risks/benefits/alternatives for each option. Patient expressed understanding, and despite these risks, consented and decided to proceed with above procedures. Informed consent was signed before proceeding.   Procedure: The patient was brought to the Operating Room and was anesthetized via general endotracheal anesthesia without complication.  A shoulder roll was placed and the patient was prepped and draped in usual sterile fashion with the head turned to the left. Prior to prepping and draping, electrodes were placed in the genioglossus and hyoglossus muscle and connected to the NIM box for intraoperative nerve monitoring. Intraoperative antibiotics and steroids were administered.   1% lidocaine  with 1:100000 epinephrine  was injected in the planned and marked submandibular and chest incisions.  The patient was then prepped and draped in the standard fashion for this procedure.     A modified sub-mandibular incision was made in the right upper neck halfway between the hyoid bone and the inferior border of the mandible, about 1cm lateral from midline and about 5cm in length. Dissection was carried down through the subcutaneous tissue and platysma. The inferior border of the submandibular gland was identified as well as the digastric tendon. The submandibular gland and the overlying fascia with the marginal mandibular nerve were retracted superiorly and laterally.  The digastric tendon was retracted inferiorly using two vessel loops. Dissection was carried down under the mylohyoid muscle where the hypoglossal nerve was identified in its usual fashion.    The mylohyoid muscle was retracted anteriorly, and the hypoglossal nerve was dissected medially. The lateral branches to retrusor muscles were identified,  and tested intra-operatively using the bipolar NIM stimulator. The superior/posterior branches innervating the hyoglossus muscle were identified using the NIM stimulator and anatomical  cues.  The cuff electrode for the hypoglossal nerve stimulator was placed distally to these branches innervating the genioglossus, transverse, and vertical muscles. C1 branch was included. The stimulation lead was anchored to the digastric tendon using two 2-0 silk sutures. The wound bed was then irrigated with antibiotic irrigation. The lead body slack between the cuff and the anchor was gently tucked deep to the submandibular gland.   A second 5 cm incision was made in the right upper chest over the second intercostal space, approximately 3cm lateral to the sternal margin. Dissection was carried down through the skin and subcutaneous tissue to the fascia of the pectoralis muscle. An inferior pocket for the generator was created deep to the subcutaneous layer and superficial to the fascia of the pectoralis muscle.  .   The stimulation lead was then tunneled in a subplatysmal plane with blunt dissection and brought out into the sub-clavicular pocket where the stimulation lead was connected to the implantable pulse generator.  The implantable pulse generator [id number]; [id number]) was placed in the subclavicular pocket ensuring the lead body was deep to the generator and secured with use of air knots to the pectoralis fascia using silk sutures. Diagnostic evaluation confirmed good placement of the stimulation cuff as demonstrated by activation of the genioglossus and transverse and vertical muscles, resulting in unhindered, stiffened tongue protrusion, confirmed visually. Diagnostic evaluation also confirmed functionality of respiratory sensing with good rise and fall associated with patient respirations.   The wound bed was then irrigated with antibiotic irrigation and hemostasis was noted adequate.   The chest and neck wounds were  then closed in three layers with deep 3-0 and 4-0 vicryl sutures and a 5-0 subcuticular monocryl followed by dermabond. A pressure dressing was applied to the neck (sponge and tape).The patient was then awakened, extubated, and transferred to Recovery Room in stable condition. Post-operative xrays were obtained in the recovery room  Eldora KATHEE Blanch

## 2024-06-08 ENCOUNTER — Encounter (HOSPITAL_COMMUNITY): Payer: Self-pay | Admitting: Otolaryngology

## 2024-06-08 NOTE — Anesthesia Postprocedure Evaluation (Signed)
 Anesthesia Post Note  Patient: Maresha Anastos  Procedure(s) Performed: INSERTION, HYPOGLOSSAL NERVE STIMULATOR (Right: Neck)     Patient location during evaluation: PACU Anesthesia Type: General Level of consciousness: awake and alert Pain management: pain level controlled Vital Signs Assessment: post-procedure vital signs reviewed and stable Respiratory status: spontaneous breathing, nonlabored ventilation, respiratory function stable and patient connected to nasal cannula oxygen Cardiovascular status: blood pressure returned to baseline and stable Postop Assessment: no apparent nausea or vomiting Anesthetic complications: no   No notable events documented.  Last Vitals:  Vitals:   06/07/24 1612 06/07/24 1615  BP:  (!) 146/88  Pulse: (!) 102 98  Resp: (!) 23   Temp:  36.4 C  SpO2: 94% 95%    Last Pain:  Vitals:   06/07/24 1615  TempSrc:   PainSc: 3                  James Senn S

## 2024-06-10 ENCOUNTER — Encounter (INDEPENDENT_AMBULATORY_CARE_PROVIDER_SITE_OTHER): Payer: Self-pay | Admitting: Otolaryngology

## 2024-06-10 NOTE — Progress Notes (Signed)
 Dear Dr. Dyane, Here is my assessment for our mutual patient, [name]. Thank you for allowing me the opportunity to care for your patient. Please do not hesitate to contact me should you have any other questions. Sincerely, Dr. Eldora Blanch  Otolaryngology Clinic Note Referring provider: Dr. Dyane HPI:  [name] is a 58 y.o. female kindly referred by Dr. Dyane for evaluation of OSA and CPAP intolerance Diagnosed with OSA in 2022.  Initial visit (11/2023):  Using CPAP: not using it currently because of intolerance; reports she pulls it off in her sleep Interventions: Nasal pillow - does not work, tried oral appliance and failed Cannot tolerate currently due to pulling mask off and cannot tolerate it Insomnia: no Chest surgery: no Denies dysphonia, dysphagia Workup so far: Home sleep study, Pulmonary and Sleep Eval  Does have RLS - on ropinorole; does not interfere with sleep  --------------------------------------------------------- 04/20/2024 Seen in follow up. Did have repeat sleep study and we discussed results. She wishes to continue to proceed with hypoglossal nerve stim since she does not use CPAP and cannot tolerate it. She is now on ASA 81 due to the TIA and another episode in July 2025 (perhaps Migraine?). She is waiting to have better control of her sugars prior to replacement of spinal cord stim  pulse generator. Of note, she does have an unusual sleeping pattnern for sleeping 24 hours several times per month with low ESS.  No sedating medication use.   --------------------------------------------------------- 05/30/2024 The patient gave consent to have this visit done by telemedicine / virtual visit, two identifiers were used to identify patient. This is also consent for access the chart and treat the patient via this visit. The patient is located in [place] , at her home.  I, the provider, am at the office.  We spent 10 minutes together for the visit.  Joined by  telephone  Passed DISE. We discussed again R/B/A for inspire. She is willing to proceed. Of note, we discussed risk of anesthesia. She otherwise reports no major changes to her health sine her DISE.[name]     H&N Surgery: Tonsillectomy, ACDF (left side - 2011) Personal or FHx of bleeding dz or anesthesia difficulty: no  GLP-1: Mounjaro  AP/AC: ASA 81  Tobacco: quit. Lives in [place], [place]  PMHx: Sarcoidosis and Asthma, Depression, Fibromyalgia, Ankylosing Spondylitis, RA, Hypothyroidism, Chronic pain (prior on dilaudid ), T2DM on insulin , TIA, h/o seizures, Migraine, HTN, Spinal Cord stim, HLD  Independent Review of Additional Tests or Records:  Dr. Shellia (Pulm) 11/17/2023: noted asthma and sarcoidosis, some sinus congestion; has OSA with trouble keeping CPAP on; tried oral appliance but did not tolerate; Dx: OSA; Rx: CPAP and follow up for Inspire Spirometry independently reviewed (11/17/2023): FEV 2.64, FEV1% 84 [name] [facility]) 11/04/2023 notes reviewed in media tab: CPAP for 6 months, can't adjust; nasal pillows, mild sleepiness while driving; Dx: CPAP intolerance, OSA; Rx: ef ENT Sleep study reviewed (02/05/2023): AHI 23.6, Central apnea index: 6.9    [facility] Sleep study (02/02/2024) interpreted: AHI 15.2 (3% was 21.1), Ow < 88% was <54min; no PLM; Central index: 0 PMH/Meds/All/SocHx/FamHx/ROS:   Past Medical History:  Diagnosis Date   Ankylosing spondylitis (HCC)    Anxiety    Arthritis    back, hands, neck (09/18/2014)   Asthma    Atypical chest pain    Chronic back pain    whole spine   Chronic bronchitis (HCC)    get it close to q yr (09/18/2014)   Chronic  pain    Complicated migraine    Depression    Diabetes mellitus without complication (HCC)    Environmental allergies    Erosion of suburethral sling    Fibromyalgia    Frequency of urination    GERD (gastroesophageal reflux disease)    Headache    at least several times/wk (09/18/2014)   Heart murmur     when I was a baby   Herpes simplex type 2 infection    History of angina    HLA B27 positive    Hypertension    Hypothyroidism    IBS (irritable bowel syndrome)    IC (interstitial cystitis)    Lesion of frontal lobe of brain    Migraine    maybe once/month (09/18/2014)   Nocturia    NSVD (normal spontaneous vaginal delivery)    x3   Osteoarthritis    Pneumonia    RLS (restless legs syndrome)    Sarcoid    Seizures (HCC)    when I was a teenager   Shortness of breath dyspnea    Exertion   SUI (stress urinary incontinence, female)    Thyroid  disease      Past Surgical History:  Procedure Laterality Date   ABDOMINAL HYSTERECTOMY  2006   ANTERIOR CERVICAL DECOMP/DISCECTOMY FUSION  2011   APPENDECTOMY  1985   ARTHROPLASTY     Left thumb   BACK SURGERY  2008   X 2 in 2010   BACK SURGERY     CESAREAN SECTION  2000   CRANIECTOMY FOR DEPRESSED SKULL FRACTURE Left 12/05/2014   Procedure: Left frontal stereotactic craniectomy for biopsy of skull lesion;  Surgeon: Gerldine Maizes, MD;  Location: [facility];  Service: Neurosurgery;  Laterality: Left;  Left frontal stereotactic craniectomy for biopsy of skull lesion   CYSTO WITH HYDRODISTENSION N/A 11/30/2012   Procedure: CYSTOSCOPY/HYDRODISTENSION;  Surgeon: Alm GORMAN Fragmin, MD;  Location: [facility];  Service: Urology;  Laterality: N/A;   IMPLANTATION OF HYPOGLOSSAL NERVE STIMULATOR Right 06/07/2024   Procedure: INSERTION, HYPOGLOSSAL NERVE STIMULATOR;  Surgeon: Tobie Eldora NOVAK, MD;  Location: [facility] OR;  Service: ENT;  Laterality: Right;   KNEE ARTHROSCOPY Right    LUMBAR DISC SURGERY  02-02-2007   RIGHT SIDE L5 -- S1   LUMBAR FUSION  12-04-2008   L4  -- S1   LYNX RETROPUBIC SUBURETHRAL SLING  03-02-2007   MASS EXCISION Left 03/19/2017   Procedure: EXCISION LEFT WRIST VOLAR MASS;  Surgeon: Sissy Cough, MD;  Location: [facility] OR;  Service: Orthopedics;  Laterality: Left;   MEDIASTINOSCOPY N/A 10/26/2014    Procedure: MEDIASTINOSCOPY;  Surgeon: Elspeth JAYSON Millers, MD;  Location: [facility] OR;  Service: Thoracic;  Laterality: N/A;   NECK SURGERY     PELVIC LAPAROSCOPY  1990's   LYSIS ADHESIONS   PUBOVAGINAL SLING N/A 11/30/2012   Procedure: CARLOYN GLADE;  Surgeon: Alm GORMAN Fragmin, MD;  Location: [facility];  Service: Urology;  Laterality: N/A;  1 HR EXAM UNDER ANESTHESIA, EXCISION OF SUB URETHRAL MESH, CYSTO, HOD    SPINAL CORD STIMULATOR IMPLANT     TONSILLECTOMY  1986   TUBAL LIGATION  2001   hulka clip   VIDEO BRONCHOSCOPY WITH ENDOBRONCHIAL ULTRASOUND N/A 09/21/2014   Procedure: VIDEO BRONCHOSCOPY WITH ENDOBRONCHIAL ULTRASOUND;  Surgeon: Lamar GORMAN Chris, MD;  Location: [facility] OR;  Service: Thoracic;  Laterality: N/A;    Family History  Problem Relation Age of Onset   Diabetes Mother    Hypertension  Mother    Heart disease Mother    Cancer Father        LUNG AND BRAIN   Hypertension Father    Heart disease Maternal Grandmother    Cancer Maternal Grandfather        LUNG   Heart disease Paternal Grandmother    Cancer Paternal Grandfather      Social Connections: Moderately Integrated (10/25/2023)   Received from [facility]   Social Network    How would you rate your social network (family, work, friends)?: Adequate participation with social networks      Current Outpatient Medications:    acetaminophen  (TYLENOL ) 500 MG tablet, Take 2 tablets (1,000 mg total) by mouth every 6 (six) hours as needed for up to 10 days., Disp: 30 tablet, Rfl: 2   albuterol  (VENTOLIN  HFA) 108 (90 Base) MCG/ACT inhaler, Inhale 2 puffs into the lungs every 6 (six) hours as needed for wheezing or shortness of breath., Disp: 8 g, Rfl: 6   [START ON 06/14/2024] aspirin  EC 81 MG tablet, Take 1 tablet (81 mg total) by mouth daily. Swallow whole., Disp: , Rfl:    celecoxib  (CELEBREX ) 200 MG capsule, Take 200 mg by mouth daily., Disp: , Rfl:    cephALEXin (KEFLEX) 500 MG capsule, Take 1 capsule  (500 mg total) by mouth 2 (two) times daily for 7 days., Disp: 14 capsule, Rfl: 0   Cholecalciferol (VITAMIN D3) 1.25 MG (50000 UT) CAPS, Take 50,000 Units by mouth once a week., Disp: , Rfl:    clobetasol cream (TEMOVATE) 0.05 %, Apply 1 Application topically daily as needed (lichen sclerosus)., Disp: , Rfl:    Continuous Glucose Receiver (DEXCOM G7 RECEIVER) DEVI, 1 Device by Does not apply route continuous. Change sensor every 10 days, Disp: 2 each, Rfl: 1   Continuous Glucose Sensor (DEXCOM G7 SENSOR) MISC, Use to check glucose continuously, change sensor every 10 days, Disp: 9 each, Rfl: 3   empagliflozin  (JARDIANCE ) 25 MG TABS tablet, Take 1 tablet (25 mg total) by mouth daily before breakfast., Disp: 90 tablet, Rfl: 3   fluticasone  furoate-vilanterol (BREO ELLIPTA ) 100-25 MCG/ACT AEPB, Inhale 1 puff into the lungs daily., Disp: 30 each, Rfl: 5   gabapentin  (NEURONTIN ) 100 MG capsule, Take 200 mg by mouth at bedtime., Disp: , Rfl:    hydroxychloroquine (PLAQUENIL) 200 MG tablet, Take 200 mg by mouth 2 (two) times daily., Disp: , Rfl:    hydrOXYzine  (ATARAX ) 25 MG tablet, Take 1 tablet (25 mg total) by mouth 3 (three) times daily as needed for anxiety. (Patient not taking: Reported on 06/05/2024), Disp: 30 tablet, Rfl: 0   insulin  aspart (NOVOLOG  FLEXPEN) 100 UNIT/ML FlexPen, Take 25 units plus sliding scale with meals 3 times a day, maximum 75 units/day., Disp: 45 mL, Rfl: 4   insulin  glargine, 2 Unit Dial , (TOUJEO  MAX SOLOSTAR) 300 UNIT/ML Solostar Pen, Inject 50 Units into the skin daily., Disp: 18 mL, Rfl: 3   levothyroxine  (SYNTHROID , LEVOTHROID) 137 MCG tablet, Take 137 mcg by mouth daily before breakfast., Disp: , Rfl:    lipase/protease/amylase (CREON ) 12000-38000 units CPEP capsule, Take 1-2 capsules by mouth See admin instructions. Take two capsules by mouth  with each meal and 1 capsule by mouth with a snack per patient, Disp: , Rfl:    losartan  (COZAAR ) 50 MG tablet, Take 1 tablet (50  mg total) by mouth daily., Disp: 30 tablet, Rfl: 2   metoprolol succinate (TOPROL-XL) 25 MG 24 hr tablet, Take 25 mg by  mouth daily as needed (blood pressure above150 or higher)., Disp: , Rfl:    omeprazole (PRILOSEC) 40 MG capsule, Take 80 mg by mouth every evening., Disp: , Rfl:    ondansetron  (ZOFRAN ) 4 MG tablet, Take 1 tablet (4 mg total) by mouth every 8 (eight) hours as needed for nausea or vomiting., Disp: 20 tablet, Rfl: 0   oxyCODONE  (ROXICODONE ) 5 MG immediate release tablet, Take 1 tablet (5 mg total) by mouth every 4 (four) hours as needed for up to 7 days., Disp: 20 tablet, Rfl: 0   PARoxetine  (PAXIL ) 40 MG tablet, Take 40 mg by mouth at bedtime., Disp: , Rfl: 3   promethazine  (PHENERGAN ) 25 MG tablet, Take 25 mg by mouth every 6 (six) hours as needed for nausea/vomiting., Disp: , Rfl:    QULIPTA  60 MG TABS, Take 60 mg by mouth daily., Disp: , Rfl:    rOPINIRole  (REQUIP ) 2 MG tablet, Take 4 mg by mouth at bedtime., Disp: , Rfl:    rosuvastatin  (CRESTOR ) 10 MG tablet, Take 10 mg by mouth at bedtime., Disp: , Rfl:    tirzepatide  (MOUNJARO ) 2.5 MG/0.5ML Pen, Inject 2.5 mg into the skin once a week., Disp: 6 mL, Rfl: 3   valACYclovir  (VALTREX ) 500 MG tablet, Take 500 mg by mouth at bedtime., Disp: , Rfl:    vitamin B-12 (CYANOCOBALAMIN ) 100 MCG tablet, Take 100 mcg by mouth daily., Disp: , Rfl:    Physical Exam:   LMP 06/03/2012   Salient findings:  Unable to conduct formal physical exam as this was a virtual visit  Seprately Identifiable Procedures:  Procedure Note (prior, not today) Pre-procedure diagnosis:  Obstructive sleep apnea, rule out structural cause Post-procedure diagnosis: Same Procedure: Transnasal Fiberoptic Laryngoscopy, CPT 31575 - Mod 25 Indication: see above Complications: None apparent EBL: 0 mL  The procedure was undertaken to further evaluate complaint above, with mirror exam inadequate for appropriate examination due to gag reflex and poor patient  tolerance  Procedure:  Patient was identified as correct patient. Verbal consent was obtained. The nose was sprayed with oxymetazoline and 4% lidocaine . The The flexible laryngoscope was passed through the nose to view the nasal cavity, pharynx (oropharynx, hypopharynx) and larynx.  The larynx was examined at rest and during multiple phonatory tasks. Documentation was obtained and reviewed with patient. The scope was removed. The patient tolerated the procedure well.  Findings: The nasal cavity and nasopharynx did not reveal any masses or lesions, mucosa appeared to be without obvious lesions. The tongue base, pharyngeal walls, piriform sinuses, vallecula, epiglottis and postcricoid region are normal in appearance; Muller maneuver negative. The visualized portion of the subglottis and proximal trachea is widely patent. The vocal folds are mobile bilaterally. There are no lesions on the free edge of the vocal folds nor elsewhere in the larynx worrisome for malignancy.    Electronically signed by: Eldora KATHEE Blanch, MD 06/10/2024 3:19 PM   Impression & Plans:  Ellicia Alix is a 58 y.o. female with:  1. OSA (obstructive sleep apnea)   2. Intolerance of continuous positive airway pressure (CPAP) ventilation    After repeat sleep study, AHI 15.2 with essentially no central sleep index. She has had possibly two TIAs in last few months with poor glucose control. We again discussed options including continued CPAP v/s oral appliance v/s Inspire. Tonsils absent.  After long discussion, she would prefer to go forward with CN 12 stim rather than trial oral appliance. Cannot tolerate CPAP. Reasonable as do think treating her  OSA is important in light of her recent TIA  We again had a discussion regarding risks of Inspire including lack of benefit, persistent symptoms, pneumothorax, tongue soreness or weakness, Floor of mouth numbness, injury to major vessels, hematoma, implant infection, bleeding, scarring,  tethering of neck, persistent symptoms, need for further procedures, and risk of anesthesia among others.   She would like to proceed   Stop mounjaro  1 week pre-op; stop ASA 81 1 week prior to surgery and resume 1 week after  See below regarding exact medications prescribed this encounter including dosages and route: No orders of the defined types were placed in this encounter.   Thank you for allowing me the opportunity to care for your patient. Please do not hesitate to contact me should you have any other questions.  Sincerely, Eldora Blanch, MD Otolaryngologist (ENT), [facility] Phone: [phone] Fax: [phone]  06/10/2024, 3:19 PM   MDM:  Complexity/Problems addressed: low Data complexity: low - Morbidity:  - Prescription Drug prescribed or managed: no

## 2024-06-13 ENCOUNTER — Ambulatory Visit (INDEPENDENT_AMBULATORY_CARE_PROVIDER_SITE_OTHER): Admitting: Otolaryngology

## 2024-06-13 ENCOUNTER — Encounter (INDEPENDENT_AMBULATORY_CARE_PROVIDER_SITE_OTHER): Payer: Self-pay

## 2024-06-13 ENCOUNTER — Encounter (INDEPENDENT_AMBULATORY_CARE_PROVIDER_SITE_OTHER): Payer: Self-pay | Admitting: Otolaryngology

## 2024-06-13 VITALS — BP 136/81 | HR 84 | Ht 66.0 in | Wt 185.0 lb

## 2024-06-13 DIAGNOSIS — Z789 Other specified health status: Secondary | ICD-10-CM

## 2024-06-13 DIAGNOSIS — Z9889 Other specified postprocedural states: Secondary | ICD-10-CM

## 2024-06-13 DIAGNOSIS — G4733 Obstructive sleep apnea (adult) (pediatric): Secondary | ICD-10-CM

## 2024-06-13 NOTE — Progress Notes (Signed)
 S/p Hypoglossal nerve stimulator placement on 06/07/2024 S: Doing well, pain fairly well controlled but tenderness improving; no fevers or issue with wound. No tongue weakness, no lip weakness. No SOB. She is having slight slurring which is slowly improving due to some numbness on right side of tongue/FOM O: Tongue movement intact; no lower lip weakness; neck and chest incision c/d/I, no evidence of infection A/P: 58 y.o. y.o. w/ OSA s/p Hypoglossal nerve stimulator placement on 06/07/2024 Doing well, no evidence of infection Recommend continued gentle neck rolls Finish Keflex F/u as needed if issues -- advised to call us  if does not hear from [facility] sleep in about a week for activation

## 2024-06-16 ENCOUNTER — Telehealth: Payer: Self-pay

## 2024-06-16 DIAGNOSIS — I10 Essential (primary) hypertension: Secondary | ICD-10-CM | POA: Diagnosis not present

## 2024-06-16 DIAGNOSIS — N1831 Chronic kidney disease, stage 3a: Secondary | ICD-10-CM | POA: Diagnosis not present

## 2024-06-16 DIAGNOSIS — E1122 Type 2 diabetes mellitus with diabetic chronic kidney disease: Secondary | ICD-10-CM | POA: Diagnosis not present

## 2024-06-16 DIAGNOSIS — E1165 Type 2 diabetes mellitus with hyperglycemia: Secondary | ICD-10-CM | POA: Diagnosis not present

## 2024-06-16 NOTE — Patient Outreach (Signed)
 Telephone outreach to patient to obtain mRS was successfully completed. MRS= 0  Shereen Gin [facility] VBCI Assistant Direct Dial: [phone]  Fax: [phone] Website: [email or web address]

## 2024-06-20 ENCOUNTER — Encounter: Payer: Self-pay | Admitting: Endocrinology

## 2024-06-20 ENCOUNTER — Ambulatory Visit: Admitting: Endocrinology

## 2024-06-20 VITALS — BP 122/82 | HR 82 | Resp 20 | Ht 66.0 in | Wt 186.2 lb

## 2024-06-20 DIAGNOSIS — Z7985 Long-term (current) use of injectable non-insulin antidiabetic drugs: Secondary | ICD-10-CM | POA: Diagnosis not present

## 2024-06-20 DIAGNOSIS — Z794 Long term (current) use of insulin: Secondary | ICD-10-CM | POA: Diagnosis not present

## 2024-06-20 DIAGNOSIS — E1165 Type 2 diabetes mellitus with hyperglycemia: Secondary | ICD-10-CM

## 2024-06-20 DIAGNOSIS — Z7984 Long term (current) use of oral hypoglycemic drugs: Secondary | ICD-10-CM

## 2024-06-20 NOTE — Progress Notes (Signed)
 Outpatient Endocrinology Note Sabrina Ante Arredondo, MD   Patient's Name: [name]    DOB: 17-Jun-1966    MRN: [id number]                                                    REASON OF VISIT: Follow-up for type 2 diabetes mellitus  REFERRING PROVIDER: [name], FNP  PCP: [name], FNP  HISTORY OF PRESENT ILLNESS:   [name] is a 58 y.o. old female with past medical history listed below, is here for follow-up for type 2 diabetes mellitus.   Pertinent Diabetes History: Patient was diagnosed with type 2 diabetes mellitus in 2020.  Patient has uncontrolled type 2 diabetes mellitus.  No personal history of pancreatitis and / or family history of medullary thyroid  carcinoma or MEN 2B syndrome.   Patient reports she may have had acute pancreatitis in March 2024 while evaluated in the ER, with a review of the records in chart CT abdomen at that time was unremarkable pancreas, lipase was less than 10, not elevated, was not mentioned above diagnosis of acute pancreatitis.  Patient reports she has been following with [facility] gastroenterology and reports has exocrine pancreatic insufficiency and takes Creon  intermittently.  No records available to review.  Chronic Diabetes Complications : Retinopathy: no. Last ophthalmology exam was done on annually, reportedly, following with ophthalmology regularly.  Nephropathy: no Peripheral neuropathy: no Coronary artery disease: on Stroke: on  Relevant comorbidities and cardiovascular risk factors: Obesity: yes Body mass index is 30.05 kg/m.  Hypertension: Yes  Hyperlipidemia : Yes, on statin   Current / Home Diabetic regimen includes:  Toujeo  30 -35 units daily in the morning.  Humalog  sliding scale 20 units 2 times a day.  Prior diabetic medications: Metformin  regular and extended release, had a GI intolerance and stopped.  She had taken Mounjaro  in the past, switched to Ozempic . Ozempic  makes sickness / nausea.  Used to  be on Ozempic  was stopped due to GI intolerance.  Glycemic data:    CONTINUOUS GLUCOSE MONITORING SYSTEM (CGMS) INTERPRETATION: At today's visit, we reviewed CGM downloads. The full report is scanned in the media. Reviewing the CGM trends, blood glucose are as follows:  Dexcom G7 CGM  Sensor Download (Sensor download was reviewed and summarized below.) Dates: October 8 to October 21 , 2025, 14 days Sensor Average: 211 Glucose Management Indicator: 8.4%  % data captured: 85%    Previous :     Previous:    Interpretation: Significant improvement in blood sugar compared to last visit.  She still has frequent hyperglycemia with blood sugar up to 300-350 range postprandially especially with lunch and at bedtime.  Blood sugar overnight, early morning in between the meals trending down.  No hypoglycemia.  Hypoglycemia: Patient has no hypoglycemic episodes. Patient has hypoglycemia awareness.  Factors modifying glucose control: 1.  Diabetic diet assessment: 3 meals a day and sometimes bedtime snack.  2.  Staying active or exercising:   3.  Medication compliance: compliant most of the time.  Interval history  CGM data as reviewed above, improvement on blood sugar significantly.  GMI on CGM 8.4%.  Diabetes has been as reviewed above.  She has been taking Toujeo  usually 30 units daily and taking NovoLog  20 units with meals 2 times a day.  She reports taking NovoLog   before eating.  She has been taking Mounjaro  and Jardiance .  Tolerating well.  No other complaints today.   REVIEW OF SYSTEMS As per history of present illness.   PAST MEDICAL HISTORY: Past Medical History:  Diagnosis Date   Ankylosing spondylitis (HCC)    Anxiety    Arthritis    back, hands, neck (09/18/2014)   Asthma    Atypical chest pain    Chronic back pain    whole spine   Chronic bronchitis (HCC)    get it close to q yr (09/18/2014)   Chronic pain    Complicated migraine    Depression     Diabetes mellitus without complication (HCC)    Environmental allergies    Erosion of suburethral sling    Fibromyalgia    Frequency of urination    GERD (gastroesophageal reflux disease)    Headache    at least several times/wk (09/18/2014)   Heart murmur    when I was a baby   Herpes simplex type 2 infection    History of angina    HLA B27 positive    Hypertension    Hypothyroidism    IBS (irritable bowel syndrome)    IC (interstitial cystitis)    Lesion of frontal lobe of brain    Migraine    maybe once/month (09/18/2014)   Nocturia    NSVD (normal spontaneous vaginal delivery)    x3   Osteoarthritis    Pneumonia    RLS (restless legs syndrome)    Sarcoid    Seizures (HCC)    when I was a teenager   Shortness of breath dyspnea    Exertion   SUI (stress urinary incontinence, female)    Thyroid  disease     PAST SURGICAL HISTORY: Past Surgical History:  Procedure Laterality Date   ABDOMINAL HYSTERECTOMY  2006   ANTERIOR CERVICAL DECOMP/DISCECTOMY FUSION  2011   APPENDECTOMY  1985   ARTHROPLASTY     Left thumb   BACK SURGERY  2008   X 2 in 2010   BACK SURGERY     CESAREAN SECTION  2000   CRANIECTOMY FOR DEPRESSED SKULL FRACTURE Left 12/05/2014   Procedure: Left frontal stereotactic craniectomy for biopsy of skull lesion;  Surgeon: Gerldine Maizes, MD;  Location: [facility];  Service: Neurosurgery;  Laterality: Left;  Left frontal stereotactic craniectomy for biopsy of skull lesion   CYSTO WITH HYDRODISTENSION N/A 11/30/2012   Procedure: CYSTOSCOPY/HYDRODISTENSION;  Surgeon: Alm GORMAN Fragmin, MD;  Location: [facility];  Service: Urology;  Laterality: N/A;   IMPLANTATION OF HYPOGLOSSAL NERVE STIMULATOR Right 06/07/2024   Procedure: INSERTION, HYPOGLOSSAL NERVE STIMULATOR;  Surgeon: Tobie Eldora NOVAK, MD;  Location: [facility] OR;  Service: ENT;  Laterality: Right;   KNEE ARTHROSCOPY Right    LUMBAR DISC SURGERY  02-02-2007   RIGHT SIDE L5 -- S1   LUMBAR  FUSION  12-04-2008   L4  -- S1   LYNX RETROPUBIC SUBURETHRAL SLING  03-02-2007   MASS EXCISION Left 03/19/2017   Procedure: EXCISION LEFT WRIST VOLAR MASS;  Surgeon: Sissy Cough, MD;  Location[facility] OR;  Service: Orthopedics;  Laterality: Left;   MEDIASTINOSCOPY N/A 10/26/2014   Procedure: MEDIASTINOSCOPY;  Surgeon: Elspeth JAYSON Millers, MD;  Location: [facility] OR;  Service: Thoracic;  Laterality: N/A;   NECK SURGERY     PELVIC LAPAROSCOPY  1990's   LYSIS ADHESIONS   PUBOVAGINAL SLING N/A 11/30/2012   Procedure: CARLOYN GLADE;  Surgeon: Alm GORMAN Fragmin, MD;  Location: [facility];  Service: Urology;  Laterality: N/A;  1 HR EXAM UNDER ANESTHESIA, EXCISION OF SUB URETHRAL MESH, CYSTO, HOD    SPINAL CORD STIMULATOR IMPLANT     TONSILLECTOMY  1986   TUBAL LIGATION  2001   hulka clip   VIDEO BRONCHOSCOPY WITH ENDOBRONCHIAL ULTRASOUND N/A 09/21/2014   Procedure: VIDEO BRONCHOSCOPY WITH ENDOBRONCHIAL ULTRASOUND;  Surgeon: Lamar GORMAN Chris, MD;  Location: [facility] OR;  Service: Thoracic;  Laterality: N/A;    ALLERGIES: Allergies  Allergen Reactions   Lyrica [Pregabalin] Anaphylaxis   Topiramate  Other (See Comments)    Pt had hallucinations   Humira [Adalimumab] Other (See Comments)    Muscle weakness   Infliximab  Hives, Itching, Rash and Other (See Comments)     infusion reaction   Percocet [Oxycodone -Acetaminophen ] Itching   Sulfamethoxazole-Trimethoprim Other (See Comments)    Fever     Vicodin [Hydrocodone -Acetaminophen ] Itching    FAMILY HISTORY:  Family History  Problem Relation Age of Onset   Diabetes Mother    Hypertension Mother    Heart disease Mother    Cancer Father        LUNG AND BRAIN   Hypertension Father    Heart disease Maternal Grandmother    Cancer Maternal Grandfather        LUNG   Heart disease Paternal Grandmother    Cancer Paternal Grandfather     SOCIAL HISTORY: Social History   Socioeconomic History   Marital status: Significant Other     Spouse name: Not on file   Number of children: Not on file   Years of education: Not on file   Highest education level: Not on file  Occupational History   Occupation: unemployed due to medical reasons  Tobacco Use   Smoking status: Former    Current packs/day: 0.00    Average packs/day: 0.5 packs/day for 15.0 years (7.5 ttl pk-yrs)    Types: Cigarettes    Start date: 01/16/1999    Quit date: 01/15/2014    Years since quitting: 10.4   Smokeless tobacco: Never  Vaping Use   Vaping status: Every Day   Substances: Nicotine  Substance and Sexual Activity   Alcohol use: Not Currently    Comment: few glasses a week   Drug use: Never    Comment: marijuana medicinal patches on her back - 03/18/17 states she no longer uses this   Sexual activity: Not Currently  Other Topics Concern   Not on file  Social History Narrative   ** Merged History Encounter **       Social Drivers of Health   Financial Resource Strain: Low Risk  (10/25/2023)   Received from [facility]   Overall Financial Resource Strain (CARDIA)    Difficulty of Paying Living Expenses: Not hard at all  Food Insecurity: No Food Insecurity (05/05/2024)   Hunger Vital Sign    Worried About Running Out of Food in the Last Year: Never true    Ran Out of Food in the Last Year: Never true  Transportation Needs: No Transportation Needs (05/05/2024)   PRAPARE - Administrator, Civil Service (Medical): No    Lack of Transportation (Non-Medical): No  Physical Activity: Unknown (10/25/2023)   Received from [facility]   Exercise Vital Sign    On average, how many days per week do you engage in moderate to strenuous exercise (like a brisk walk)?: 0 days    Minutes of Exercise per Session: Not on file  Stress: No Stress Concern Present (10/25/2023)   Received from [facility] - Occupational Stress Questionnaire    Feeling of Stress : Not at all  Social Connections:  Moderately Integrated (10/25/2023)   Received from [facility]   Social Network    How would you rate your social network (family, work, friends)?: Adequate participation with social networks    MEDICATIONS:  Current Outpatient Medications  Medication Sig Dispense Refill   albuterol  (VENTOLIN  HFA) 108 (90 Base) MCG/ACT inhaler Inhale 2 puffs into the lungs every 6 (six) hours as needed for wheezing or shortness of breath. 8 g 6   aspirin  EC 81 MG tablet Take 1 tablet (81 mg total) by mouth daily. Swallow whole.     celecoxib  (CELEBREX ) 200 MG capsule Take 200 mg by mouth daily.     Cholecalciferol (VITAMIN D3) 1.25 MG (50000 UT) CAPS Take 50,000 Units by mouth once a week.     clobetasol cream (TEMOVATE) 0.05 % Apply 1 Application topically daily as needed (lichen sclerosus).     Continuous Glucose Receiver (DEXCOM G7 RECEIVER) DEVI 1 Device by Does not apply route continuous. Change sensor every 10 days 2 each 1   Continuous Glucose Sensor (DEXCOM G7 SENSOR) MISC Use to check glucose continuously, change sensor every 10 days 9 each 3   empagliflozin  (JARDIANCE ) 25 MG TABS tablet Take 1 tablet (25 mg total) by mouth daily before breakfast. 90 tablet 3   fluticasone  furoate-vilanterol (BREO ELLIPTA ) 100-25 MCG/ACT AEPB Inhale 1 puff into the lungs daily. 30 each 5   gabapentin  (NEURONTIN ) 100 MG capsule Take 200 mg by mouth at bedtime.     hydroxychloroquine (PLAQUENIL) 200 MG tablet Take 200 mg by mouth 2 (two) times daily.     hydrOXYzine  (ATARAX ) 25 MG tablet Take 1 tablet (25 mg total) by mouth 3 (three) times daily as needed for anxiety. 30 tablet 0   insulin  aspart (NOVOLOG  FLEXPEN) 100 UNIT/ML FlexPen Take 25 units plus sliding scale with meals 3 times a day, maximum 75 units/day. 45 mL 4   insulin  glargine, 2 Unit Dial , (TOUJEO  MAX SOLOSTAR) 300 UNIT/ML Solostar Pen Inject 50 Units into the skin daily. 18 mL 3   levothyroxine  (SYNTHROID , LEVOTHROID) 137 MCG tablet Take 137 mcg by mouth  daily before breakfast.     lipase/protease/amylase (CREON ) 12000-38000 units CPEP capsule Take 1-2 capsules by mouth See admin instructions. Take two capsules by mouth  with each meal and 1 capsule by mouth with a snack per patient     losartan  (COZAAR ) 50 MG tablet Take 1 tablet (50 mg total) by mouth daily. 30 tablet 2   metoprolol succinate (TOPROL-XL) 25 MG 24 hr tablet Take 25 mg by mouth daily as needed (blood pressure above150 or higher).     omeprazole (PRILOSEC) 40 MG capsule Take 80 mg by mouth every evening.     ondansetron  (ZOFRAN ) 4 MG tablet Take 1 tablet (4 mg total) by mouth every 8 (eight) hours as needed for nausea or vomiting. 20 tablet 0   PARoxetine  (PAXIL ) 40 MG tablet Take 40 mg by mouth at bedtime.  3   promethazine  (PHENERGAN ) 25 MG tablet Take 25 mg by mouth every 6 (six) hours as needed for nausea/vomiting.     QULIPTA  60 MG TABS Take 60 mg by mouth daily.     rOPINIRole  (REQUIP ) 2 MG tablet Take 4 mg by mouth at bedtime.     rosuvastatin  (CRESTOR )  10 MG tablet Take 10 mg by mouth at bedtime.     tirzepatide  (MOUNJARO ) 2.5 MG/0.5ML Pen Inject 2.5 mg into the skin once a week. 6 mL 3   valACYclovir  (VALTREX ) 500 MG tablet Take 500 mg by mouth at bedtime.     vitamin B-12 (CYANOCOBALAMIN ) 100 MCG tablet Take 100 mcg by mouth daily.     No current facility-administered medications for this visit.    PHYSICAL EXAM: Vitals:   06/20/24 1104  BP: 122/82  Pulse: 82  Resp: 20  SpO2: 98%  Weight: 186 lb 3.2 oz (84.5 kg)  Height: 5' 6 (1.676 m)    Body mass index is 30.05 kg/m.  Wt Readings from Last 3 Encounters:  06/20/24 186 lb 3.2 oz (84.5 kg)  06/13/24 185 lb (83.9 kg)  06/07/24 185 lb (83.9 kg)    General: Well developed, well nourished female in no apparent distress.  HEENT: AT/[place], no external lesions.  Eyes: Conjunctiva clear and no icterus. Neck: Neck supple  Lungs: Respirations not labored Neurologic: Alert, oriented, normal speech Extremities /  Skin: Dry.   Psychiatric: Does not appear depressed or anxious  Diabetic Foot Exam - Simple   No data filed     LABS Reviewed Lab Results  Component Value Date   HGBA1C 10.2 (A) 04/13/2024   HGBA1C 10.4 (H) 03/07/2024   HGBA1C 10.0 (H) 12/08/2023   No results found for: FRUCTOSAMINE Lab Results  Component Value Date   CHOL 164 03/07/2024   HDL 49 03/07/2024   LDLCALC 90 03/07/2024   LDLDIRECT 100.0 01/07/2015   TRIG 125 03/07/2024   CHOLHDL 3.3 03/07/2024   Lab Results  Component Value Date   MICRALBCREAT NOTE 04/13/2024   Lab Results  Component Value Date   CREATININE 0.98 05/06/2024   Lab Results  Component Value Date   GFR 67.09 04/26/2020    ASSESSMENT / PLAN  1. Uncontrolled type 2 diabetes mellitus with hyperglycemia (HCC)    Diabetes Mellitus type 2, complicated by no other known complications. - Diabetic status / severity: Uncontrolled.  Improving, GMI on CGM 8.4%.  Lab Results  Component Value Date   HGBA1C 10.2 (A) 04/13/2024    - Hemoglobin A1c goal : <6.5%  Discussed about type 2 diabetes mellitus and importance of controlling blood sugar to prevent potential chronic diabetic complications including diabetic retinopathy, neuropathy and nephropathy.  Diabetes control improving with less hyperglycemia.  He still with postprandial hyperglycemia as mentioned in HPI.  - Medications: See below.  Adjusted as follows.  Increase Toujeo  from 30 units to 36 units daily in the morning. Increase NovoLog  from 20 to 25 units with meals 3 times daily, plus sliding scale as follows.  Moderate Sliding Scale Blood Glucose        Insulin  60-150                     None 151-200                   3 units 201-250                   5 units 251-300                   7 units 301-350                   9 units 351-400  11 units    >400                       12 units and call provider  Advised to take Humalog  5 units for snacks.  Continue  Mounjaro  2.5 mg weekly.  She wants to stay on the lower dose of Mounjaro  for now.  Consider increasing dose in the future. Continue Jardiance  25 mg daily.  She is waiting to have spinal stimulator changed, and waiting to have better diabetes control she was told to have a hemoglobin A1c at least below 8%.  Will plan to have lab visit for hemoglobin A1c next month.  - Home glucose testing: CGM and check as needed.  Dexcom G7.  - Discussed/ Gave Hypoglycemia treatment plan.  # Consult : not required at this time.   # Annual urine for microalbuminuria/ creatinine ratio, no microalbuminuria currently.  Last  Lab Results  Component Value Date   MICRALBCREAT NOTE 04/13/2024    # Foot check nightly.  # Annual dilated diabetic eye exams.   - Diet: Make healthy diabetic food choices - Life style / activity / exercise: Discussed.  2. Blood pressure  -  BP Readings from Last 1 Encounters:  06/20/24 122/82    - Control is in target.  - No change in current plans.  3. Lipid status / Hyperlipidemia - Last  Lab Results  Component Value Date   LDLCALC 90 03/07/2024   - Continue atorvastatin, managed by primary care provider.  # Primary hypothyroidism -Current on levothyroxine , managed by primary care provider.  Diagnoses and all orders for this visit:  Uncontrolled type 2 diabetes mellitus with hyperglycemia (HCC) -     Hemoglobin A1c     DISPOSITION Follow up in clinic in 4 months suggested.  Lab in 1 month for hemoglobin A1c.   All questions answered and patient verbalized understanding of the plan.  Sabrina Wadie Liew, MD [facility] [address] [place], [address] [place], [place] [address] Phone # [phone]  At least part of this note was generated using voice recognition software. Inadvertent word errors may have occurred, which were not recognized during the proofreading process.

## 2024-06-20 NOTE — Patient Instructions (Signed)
 Diabetes regimen  Increase Toujeo  36  units daily in the morning. Take novolog  25  units with meals 3 times a day before eating, plus sliding scale as follows.  Moderate Sliding Scale Blood Glucose        Insulin  60-150                     None 151-200                   3 units 201-250                   5 units 251-300                   7 units 301-350                   9 units 351-400                  11 units    >400                       12 units and call provider  For snacks take 5 units of novolog .  Mounjaro  2.5mg  weekly. Jardiacne 25mg  daily.

## 2024-06-26 DIAGNOSIS — K219 Gastro-esophageal reflux disease without esophagitis: Secondary | ICD-10-CM | POA: Diagnosis not present

## 2024-06-26 DIAGNOSIS — K8681 Exocrine pancreatic insufficiency: Secondary | ICD-10-CM | POA: Diagnosis not present

## 2024-06-26 DIAGNOSIS — Z860101 Personal history of adenomatous and serrated colon polyps: Secondary | ICD-10-CM | POA: Diagnosis not present

## 2024-06-30 DIAGNOSIS — N1831 Chronic kidney disease, stage 3a: Secondary | ICD-10-CM | POA: Diagnosis not present

## 2024-06-30 DIAGNOSIS — I10 Essential (primary) hypertension: Secondary | ICD-10-CM | POA: Diagnosis not present

## 2024-06-30 DIAGNOSIS — E1165 Type 2 diabetes mellitus with hyperglycemia: Secondary | ICD-10-CM | POA: Diagnosis not present

## 2024-06-30 DIAGNOSIS — E039 Hypothyroidism, unspecified: Secondary | ICD-10-CM | POA: Diagnosis not present

## 2024-07-05 DIAGNOSIS — D8689 Sarcoidosis of other sites: Secondary | ICD-10-CM | POA: Diagnosis not present

## 2024-07-05 DIAGNOSIS — Z79899 Other long term (current) drug therapy: Secondary | ICD-10-CM | POA: Diagnosis not present

## 2024-07-05 DIAGNOSIS — G894 Chronic pain syndrome: Secondary | ICD-10-CM | POA: Diagnosis not present

## 2024-07-05 DIAGNOSIS — E669 Obesity, unspecified: Secondary | ICD-10-CM | POA: Diagnosis not present

## 2024-07-05 DIAGNOSIS — Z6830 Body mass index (BMI) 30.0-30.9, adult: Secondary | ICD-10-CM | POA: Diagnosis not present

## 2024-07-05 DIAGNOSIS — Z1589 Genetic susceptibility to other disease: Secondary | ICD-10-CM | POA: Diagnosis not present

## 2024-07-05 DIAGNOSIS — M0579 Rheumatoid arthritis with rheumatoid factor of multiple sites without organ or systems involvement: Secondary | ICD-10-CM | POA: Diagnosis not present

## 2024-07-12 DIAGNOSIS — Z9682 Presence of neurostimulator: Secondary | ICD-10-CM | POA: Diagnosis not present

## 2024-07-12 DIAGNOSIS — G4733 Obstructive sleep apnea (adult) (pediatric): Secondary | ICD-10-CM | POA: Diagnosis not present

## 2024-07-12 DIAGNOSIS — M459 Ankylosing spondylitis of unspecified sites in spine: Secondary | ICD-10-CM | POA: Diagnosis not present

## 2024-07-16 DIAGNOSIS — E1165 Type 2 diabetes mellitus with hyperglycemia: Secondary | ICD-10-CM | POA: Diagnosis not present

## 2024-07-16 DIAGNOSIS — E1122 Type 2 diabetes mellitus with diabetic chronic kidney disease: Secondary | ICD-10-CM | POA: Diagnosis not present

## 2024-07-16 DIAGNOSIS — N1831 Chronic kidney disease, stage 3a: Secondary | ICD-10-CM | POA: Diagnosis not present

## 2024-07-16 DIAGNOSIS — I10 Essential (primary) hypertension: Secondary | ICD-10-CM | POA: Diagnosis not present

## 2024-07-26 ENCOUNTER — Other Ambulatory Visit

## 2024-07-30 DIAGNOSIS — E039 Hypothyroidism, unspecified: Secondary | ICD-10-CM | POA: Diagnosis not present

## 2024-07-30 DIAGNOSIS — E1165 Type 2 diabetes mellitus with hyperglycemia: Secondary | ICD-10-CM | POA: Diagnosis not present

## 2024-07-30 DIAGNOSIS — E1122 Type 2 diabetes mellitus with diabetic chronic kidney disease: Secondary | ICD-10-CM | POA: Diagnosis not present

## 2024-07-30 DIAGNOSIS — I10 Essential (primary) hypertension: Secondary | ICD-10-CM | POA: Diagnosis not present

## 2024-07-30 DIAGNOSIS — N1831 Chronic kidney disease, stage 3a: Secondary | ICD-10-CM | POA: Diagnosis not present

## 2024-08-07 DIAGNOSIS — G894 Chronic pain syndrome: Secondary | ICD-10-CM | POA: Diagnosis not present

## 2024-08-07 DIAGNOSIS — F3341 Major depressive disorder, recurrent, in partial remission: Secondary | ICD-10-CM | POA: Diagnosis not present

## 2024-08-07 DIAGNOSIS — M940 Chondrocostal junction syndrome [Tietze]: Secondary | ICD-10-CM | POA: Diagnosis not present

## 2024-08-07 DIAGNOSIS — G2581 Restless legs syndrome: Secondary | ICD-10-CM | POA: Diagnosis not present

## 2024-08-07 DIAGNOSIS — J3489 Other specified disorders of nose and nasal sinuses: Secondary | ICD-10-CM | POA: Diagnosis not present

## 2024-08-07 DIAGNOSIS — I10 Essential (primary) hypertension: Secondary | ICD-10-CM | POA: Diagnosis not present

## 2024-08-07 DIAGNOSIS — E039 Hypothyroidism, unspecified: Secondary | ICD-10-CM | POA: Diagnosis not present

## 2024-08-07 DIAGNOSIS — Z794 Long term (current) use of insulin: Secondary | ICD-10-CM | POA: Diagnosis not present

## 2024-08-07 DIAGNOSIS — E1165 Type 2 diabetes mellitus with hyperglycemia: Secondary | ICD-10-CM | POA: Diagnosis not present

## 2024-08-16 DIAGNOSIS — Z9682 Presence of neurostimulator: Secondary | ICD-10-CM | POA: Diagnosis not present

## 2024-08-16 DIAGNOSIS — G4733 Obstructive sleep apnea (adult) (pediatric): Secondary | ICD-10-CM | POA: Diagnosis not present

## 2024-09-07 NOTE — Progress Notes (Signed)
 CARDIOLOGY CONSULT NOTE       Patient ID: [name] MRN: [id number] DOB/AGE: 11/01/1965 59 y.o.  Referring Physician: Douglass Primary Physician: [name] RAMAN, FNP Primary Cardiologist: [name] Reason for Consultation: Chest pain   HPI:  59 y.o. referred by Dr [name] for chest pain.  First seen by me 58/11/24 History of HTN, hypothyroidism, asthma, sarcoid, seizures, ankylosing spondylitis, migraines, spinal stimulator Chronic dyspnea sees Dr [name] pulmonary. Asthma Rx with astepro , breo and albuterol .  Sees rheumatology Dr [name] for sarcoidosis with polyarthritis and HLA B27 ankylosing spondylitis. Rx with Simponi  and pulmonary sarcoid not active issue   Seen by me more distantly April 2018. Normal cardiac MRI with no sarcoid EF 69% Cath in [name] [place] 2018 no obstructive CAD 30% LAD mid stenosis   Media referral indicated chest pain with strong family history of CAD   On statin but forgets to take it at times. LDL in July 163 A1c 6.9 Reviewed office note from FNP [name] July 2024 Chest pain on/off several times / week and I cared for her mom who had MI and CVA   Still with some atypical chest pain. Some self limited palpitations at night   Her son married Dr [name] daughter and living near [name] I was lucky enough to attend the  [name]   F/U PET/CT done 58/11/25 was normal with stress EF 71% and normal MBFR  Admitted in July 59 with stroke like symptoms Initially seen at [name] and got TNK Carotids minimal plaque. CT negative could not have MRI due to non compatible spine stimulator. CTA head / neck negative Echo normal EF no PFO negative bubble study.  LDL 90 A1c 10.4. She presented with left sided weakness and hemianopia. D/C on ASA   Admitted 9/58/25 with tachycardia and chest pain. ? Low BP. R/O no acute ECG changes CTA negative PE. Hydrochlorothiazide held on d/c. Azotemia and low Na improved. ? Anxiety and added atarax .   Discussed loop  recorder for cryptogenic stroke and she is willing to have this   ROS All other systems reviewed and negative except as noted above  Past Medical History:  Diagnosis Date   Ankylosing spondylitis (HCC)    Anxiety    Arthritis    back, hands, neck (09/18/2014)   Asthma    Atypical chest pain    Chronic back pain    whole spine   Chronic bronchitis (HCC)    get it close to q yr (09/18/2014)   Chronic pain    Complicated migraine    Depression    Diabetes mellitus without complication (HCC)    Environmental allergies    Erosion of suburethral sling    Fibromyalgia    Frequency of urination    GERD (gastroesophageal reflux disease)    Headache    at least several times/wk (09/18/2014)   Heart murmur    when I was a baby   Herpes simplex type 2 infection    History of angina    HLA B27 positive    Hypertension    Hypothyroidism    IBS (irritable bowel syndrome)    IC (interstitial cystitis)    Lesion of frontal lobe of brain    Migraine    maybe once/month (09/18/2014)   Nocturia    NSVD (normal spontaneous vaginal delivery)    x3   Osteoarthritis    Pneumonia    RLS (restless legs syndrome)    Sarcoid    Seizures (HCC)  when I was a teenager   Shortness of breath dyspnea    Exertion   SUI (stress urinary incontinence, female)    Thyroid  disease     Family History  Problem Relation Age of Onset   Diabetes Mother    Hypertension Mother    Heart disease Mother    Cancer Father        LUNG AND BRAIN   Hypertension Father    Heart disease Maternal Grandmother    Cancer Maternal Grandfather        LUNG   Heart disease Paternal Grandmother    Cancer Paternal Grandfather     Social History   Socioeconomic History   Marital status: Significant Other    Spouse name: Not on file   Number of children: 4   Years of education: Not on file   Highest education level: Not on file  Occupational History   Occupation: unemployed due to medical reasons   Tobacco Use   Smoking status: Former    Current packs/day: 0.00    Average packs/day: 0.5 packs/day for 15.0 years (7.5 ttl pk-yrs)    Types: Cigarettes    Start date: 01/16/1999    Quit date: 01/15/2014    Years since quitting: 10.6   Smokeless tobacco: Never  Vaping Use   Vaping status: Every Day   Substances: Nicotine  Substance and Sexual Activity   Alcohol use: Not Currently    Comment: few glasses a week   Drug use: Never    Comment: marijuana medicinal patches on her back - 03/18/17 states she no longer uses this   Sexual activity: Not Currently  Other Topics Concern   Not on file  Social History Narrative   ** Merged History Encounter **       Social Drivers of Health   Tobacco Use: Medium Risk (09/08/2024)   Patient History    Smoking Tobacco Use: Former    Smokeless Tobacco Use: Never    Passive Exposure: Not on file  Financial Resource Strain: Low Risk (10/25/2023)   Received from [facility]   Overall Financial Resource Strain (CARDIA)    Difficulty of Paying Living Expenses: Not hard at all  Food Insecurity: No Food Insecurity (05/05/2024)   Epic    Worried About Radiation Protection Practitioner of Food in the Last Year: Never true    Ran Out of Food in the Last Year: Never true  Transportation Needs: No Transportation Needs (05/05/2024)   Epic    Lack of Transportation (Medical): No    Lack of Transportation (Non-Medical): No  Physical Activity: Unknown (10/25/2023)   Received from [facility]   Exercise Vital Sign    On average, how many days per week do you engage in moderate to strenuous exercise (like a brisk walk)?: 0 days    Minutes of Exercise per Session: Not on file  Stress: No Stress Concern Present (10/25/2023)   Received from [facility] - Occupational Stress Questionnaire    Feeling of Stress : Not at all  Social Connections: Moderately Integrated (10/25/2023)   Received from [facility]   Social Network    How would  you rate your social network (family, work, friends)?: Adequate participation with social networks  Intimate Partner Violence: Not At Risk (05/05/2024)   Epic    Fear of Current or Ex-Partner: No    Emotionally Abused: No    Physically Abused: No    Sexually Abused: No  Depression (  EYV7-0): Not on file  Alcohol Screen: Not on file  Housing: Low Risk (05/05/2024)   Epic    Unable to Pay for Housing in the Last Year: No    Number of Times Moved in the Last Year: 0    Homeless in the Last Year: No  Utilities: Not At Risk (05/05/2024)   Epic    Threatened with loss of utilities: No  Health Literacy: Not on file    Past Surgical History:  Procedure Laterality Date   ABDOMINAL HYSTERECTOMY  2006   ANTERIOR CERVICAL DECOMP/DISCECTOMY FUSION  2011   APPENDECTOMY  1985   ARTHROPLASTY     Left thumb   BACK SURGERY  2008   X 2 in 2010   BACK SURGERY     CESAREAN SECTION  2000   CRANIECTOMY FOR DEPRESSED SKULL FRACTURE Left 12/05/2014   Procedure: Left frontal stereotactic craniectomy for biopsy of skull lesion;  Surgeon: Gerldine Maizes, MD;  Location: [facility];  Service: Neurosurgery;  Laterality: Left;  Left frontal stereotactic craniectomy for biopsy of skull lesion   CYSTO WITH HYDRODISTENSION N/A 11/30/2012   Procedure: CYSTOSCOPY/HYDRODISTENSION;  Surgeon: Alm GORMAN Fragmin, MD;  Location: [facility];  Service: Urology;  Laterality: N/A;   IMPLANTATION OF HYPOGLOSSAL NERVE STIMULATOR Right 06/07/2024   Procedure: INSERTION, HYPOGLOSSAL NERVE STIMULATOR;  Surgeon: Sabrina Eldora NOVAK, MD;  Location: [facility] OR;  Service: ENT;  Laterality: Right;   KNEE ARTHROSCOPY Right    LUMBAR DISC SURGERY  02-02-2007   RIGHT SIDE L5 -- S1   LUMBAR FUSION  12-04-2008   L4  -- S1   LYNX RETROPUBIC SUBURETHRAL SLING  03-02-2007   MASS EXCISION Left 03/19/2017   Procedure: EXCISION LEFT WRIST VOLAR MASS;  Surgeon: Sissy Cough, MD;  [facility];  Service: Orthopedics;  Laterality: Left;    MEDIASTINOSCOPY N/A 10/26/2014   Procedure: MEDIASTINOSCOPY;  Surgeon: Elspeth JAYSON Millers, MD;  Location: [facility];  Service: Thoracic;  Laterality: N/A;   NECK SURGERY     PELVIC LAPAROSCOPY  1990's   LYSIS ADHESIONS   PUBOVAGINAL SLING N/A 11/30/2012   Procedure: CARLOYN GLADE;  Surgeon: Alm GORMAN Fragmin, MD;  Location: [facility];  Service: Urology;  Laterality: N/A;  1 HR EXAM UNDER ANESTHESIA, EXCISION OF SUB URETHRAL MESH, CYSTO, HOD    SPINAL CORD STIMULATOR IMPLANT     TONSILLECTOMY  1986   TUBAL LIGATION  2001   hulka clip   VIDEO BRONCHOSCOPY WITH ENDOBRONCHIAL ULTRASOUND N/A 09/21/2014   Procedure: VIDEO BRONCHOSCOPY WITH ENDOBRONCHIAL ULTRASOUND;  Surgeon: Lamar GORMAN Chris, MD;  Location: [facility] OR;  Service: Thoracic;  Laterality: N/A;      Current Outpatient Medications:    albuterol  (VENTOLIN  HFA) 108 (90 Base) MCG/ACT inhaler, Inhale 2 puffs into the lungs every 6 (six) hours as needed for wheezing or shortness of breath., Disp: 8 g, Rfl: 6   aspirin  EC 81 MG tablet, Take 1 tablet (81 mg total) by mouth daily. Swallow whole., Disp: , Rfl:    celecoxib  (CELEBREX ) 200 MG capsule, Take 200 mg by mouth daily., Disp: , Rfl:    Cholecalciferol (VITAMIN D3) 1.25 MG (50000 UT) CAPS, Take 50,000 Units by mouth once a week., Disp: , Rfl:    clobetasol cream (TEMOVATE) 0.05 %, Apply 1 Application topically daily as needed (lichen sclerosus)., Disp: , Rfl:    Continuous Glucose Receiver (DEXCOM G7 RECEIVER) DEVI, 1 Device by Does not apply route continuous. Change sensor every 10  days, Disp: 2 each, Rfl: 1   Continuous Glucose Sensor (DEXCOM G7 SENSOR) MISC, Use to check glucose continuously, change sensor every 10 days, Disp: 9 each, Rfl: 3   CREON  36000-114000 units CPEP capsule, Take 36,000 Units by mouth 3 (three) times daily before meals., Disp: , Rfl:    empagliflozin  (JARDIANCE ) 25 MG TABS tablet, Take 1 tablet (25 mg total) by mouth daily before breakfast., Disp: 90  tablet, Rfl: 3   fluticasone  furoate-vilanterol (BREO ELLIPTA ) 100-25 MCG/ACT AEPB, Inhale 1 puff into the lungs daily., Disp: 30 each, Rfl: 5   gabapentin  (NEURONTIN ) 100 MG capsule, Take 200 mg by mouth at bedtime., Disp: , Rfl:    hydrochlorothiazide (HYDRODIURIL) 12.5 MG tablet, Take 12.5 mg by mouth every morning., Disp: , Rfl:    hydroxychloroquine (PLAQUENIL) 200 MG tablet, Take 200 mg by mouth 2 (two) times daily., Disp: , Rfl:    insulin  aspart (NOVOLOG  FLEXPEN) 100 UNIT/ML FlexPen, Take 25 units plus sliding scale with meals 3 times a day, maximum 75 units/day., Disp: 45 mL, Rfl: 4   insulin  glargine, 2 Unit Dial , (TOUJEO  MAX SOLOSTAR) 300 UNIT/ML Solostar Pen, Inject 50 Units into the skin daily., Disp: 18 mL, Rfl: 3   levothyroxine  (SYNTHROID , LEVOTHROID) 137 MCG tablet, Take 137 mcg by mouth daily before breakfast., Disp: , Rfl:    lipase/protease/amylase (CREON ) 12000-38000 units CPEP capsule, Take 1-2 capsules by mouth See admin instructions. Take two capsules by mouth  with each meal and 1 capsule by mouth with a snack per patient, Disp: , Rfl:    losartan  (COZAAR ) 50 MG tablet, Take 1 tablet (50 mg total) by mouth daily., Disp: 30 tablet, Rfl: 2   methocarbamol  (ROBAXIN ) 500 MG tablet, Take 500 mg by mouth every 6 (six) hours as needed., Disp: , Rfl:    metoprolol succinate (TOPROL-XL) 25 MG 24 hr tablet, Take 25 mg by mouth daily as needed (blood pressure above150 or higher)., Disp: , Rfl:    omeprazole (PRILOSEC) 40 MG capsule, Take 80 mg by mouth every evening., Disp: , Rfl:    ondansetron  (ZOFRAN ) 4 MG tablet, Take 1 tablet (4 mg total) by mouth every 8 (eight) hours as needed for nausea or vomiting., Disp: 20 tablet, Rfl: 0   PARoxetine  (PAXIL ) 40 MG tablet, Take 40 mg by mouth at bedtime., Disp: , Rfl: 3   QULIPTA  60 MG TABS, Take 60 mg by mouth daily., Disp: , Rfl:    rOPINIRole  (REQUIP ) 2 MG tablet, Take 4 mg by mouth at bedtime., Disp: , Rfl:    rosuvastatin  (CRESTOR ) 10 MG  tablet, Take 10 mg by mouth at bedtime., Disp: , Rfl:    tirzepatide  (MOUNJARO ) 2.5 MG/0.5ML Pen, Inject 2.5 mg into the skin once a week., Disp: 6 mL, Rfl: 3   valACYclovir  (VALTREX ) 500 MG tablet, Take 500 mg by mouth at bedtime., Disp: , Rfl:    vitamin B-12 (CYANOCOBALAMIN ) 100 MCG tablet, Take 100 mcg by mouth daily., Disp: , Rfl:    hydrOXYzine  (ATARAX ) 25 MG tablet, Take 1 tablet (25 mg total) by mouth 3 (three) times daily as needed for anxiety. (Patient not taking: Reported on 09/08/2024), Disp: 30 tablet, Rfl: 0   promethazine  (PHENERGAN ) 25 MG tablet, Take 25 mg by mouth every 6 (six) hours as needed for nausea/vomiting. (Patient not taking: Reported on 09/08/2024), Disp: , Rfl:     Physical Exam: Blood pressure 124/72, pulse 81, resp. rate 17, height 5' 6 (1.676 m), weight 192 lb (87.1  kg), last menstrual period 06/03/2012, SpO2 97%.    Affect appropriate Healthy:  appears stated age HEENT: normal Neck supple with no adenopathy JVP normal no bruits no thyromegaly Lungs clear with no wheezing and good diaphragmatic motion Heart:  S1/S2 no murmur, no rub, gallop or click PMI normal Inspired device/generator right neck/subclavian pocket Abdomen: benighn, BS positve, no tenderness, no AAA no bruit.  No HSM or HJR Distal pulses intact with no bruits No edema Neuro non-focal Skin warm and dry No muscular weakness   Labs:   Lab Results  Component Value Date   WBC 8.8 05/05/2024   HGB 12.8 05/05/2024   HCT 36.7 05/05/2024   MCV 83.4 05/05/2024   PLT 281 05/05/2024   No results for input(s): NA, K, CL, CO2, BUN, CREATININE, CALCIUM , PROT, BILITOT, ALKPHOS, ALT, AST, GLUCOSE in the last 168 hours.  Invalid input(s): LABALBU Lab Results  Component Value Date   CKTOTAL 26 12/25/2008   CKMB 0.5 12/25/2008   TROPONINI <0.03 09/20/2018    Lab Results  Component Value Date   CHOL 164 03/07/2024   CHOL 200 12/08/2023   CHOL 193 03/11/2017   Lab  Results  Component Value Date   HDL 49 03/07/2024   HDL 52 12/08/2023   HDL 57 03/11/2017   Lab Results  Component Value Date   LDLCALC 90 03/07/2024   LDLCALC 116 (H) 12/08/2023   LDLCALC 76 03/11/2017   Lab Results  Component Value Date   TRIG 125 03/07/2024   TRIG 162 (H) 12/08/2023   TRIG 301 (H) 03/11/2017   Lab Results  Component Value Date   CHOLHDL 3.3 03/07/2024   CHOLHDL 3.8 12/08/2023   CHOLHDL 3.4 03/11/2017   Lab Results  Component Value Date   LDLDIRECT 100.0 01/07/2015      Radiology: No results found.  EKG: 11/05/22 ST rate 101 nonspecific ST changes     ASSESSMENT AND PLAN:   Chest pain: atypical. Strong family history Cath 2018 non obstructive dx. PET CT normal 10/12/23 September 2025 r/o ? Anxiety Rx atarax  Hypothyroid:  on synthroid  replacement TSH normal [facility] labs DM:  Discussed low carb diet.  Target hemoglobin A1c is 6.5 or less.  Continue current medications. Poor control f/u endocrine  Rheum:  ankylosing spondylitis on Simponi   Sarcoid: dormant lung dx with some asthma f/u Sood has inhalers no active wheezing on exam.  CVA:  July 2025 got TNK CT negative carotids normal CTA head/neck negative echo normal no PFO Continue 81 mg ASA and statin No arrhythmias  Discussed ILR for cryptogenic stroke  OSA:  had Inspire device implanted by Dr Sabrina 06/07/24   Refer to EP for ILR    F/U in a year   Signed: Maude Emmer 09/08/2024, 9:28 AM

## 2024-09-08 ENCOUNTER — Encounter: Payer: Self-pay | Admitting: Cardiovascular Disease

## 2024-09-08 ENCOUNTER — Ambulatory Visit: Attending: Cardiovascular Disease | Admitting: Cardiovascular Disease

## 2024-09-08 VITALS — BP 124/72 | HR 81 | Resp 17 | Ht 66.0 in | Wt 192.0 lb

## 2024-09-08 DIAGNOSIS — I69398 Other sequelae of cerebral infarction: Secondary | ICD-10-CM | POA: Diagnosis not present

## 2024-09-08 DIAGNOSIS — R079 Chest pain, unspecified: Secondary | ICD-10-CM | POA: Insufficient documentation

## 2024-09-08 DIAGNOSIS — H539 Unspecified visual disturbance: Secondary | ICD-10-CM | POA: Diagnosis not present

## 2024-09-08 DIAGNOSIS — I639 Cerebral infarction, unspecified: Secondary | ICD-10-CM | POA: Insufficient documentation

## 2024-09-08 NOTE — Patient Instructions (Addendum)
 Medication Instructions:  Your physician recommends that you continue on your current medications as directed. Please refer to the Current Medication list given to you today.  *If you need a refill on your cardiac medications before your next appointment, please call your pharmacy*    Follow-Up: At [facility], you and your health needs are our priority.  As part of our continuing mission to provide you with exceptional heart care, our providers are all part of one team.  This team includes your primary Cardiologist (physician) and Advanced Practice Providers or APPs (Physician Assistants and Nurse Practitioners) who all work together to provide you with the care you need, when you need it.  Your next appointment:   6 month(s)  Provider:   Maude Emmer, MD    We recommend signing up for the patient portal called MyChart.  Sign up information is provided on this After Visit Summary.  MyChart is used to connect with patients for Virtual Visits (Telemedicine).  Patients are able to view lab/test results, encounter notes, upcoming appointments, etc.  Non-urgent messages can be sent to your provider as well.   To learn more about what you can do with MyChart, go to [email or web address].   Other Instructions Referral sent to Electrophysiology to discuss Loop Implant

## 2024-09-22 ENCOUNTER — Encounter: Payer: Self-pay | Admitting: Cardiovascular Disease

## 2024-10-05 NOTE — Telephone Encounter (Signed)
 Called and spoke w/ patient - she is scheduled for loop implant/consult with Dr. Inocencio on 3/26.

## 2024-11-20 ENCOUNTER — Ambulatory Visit: Admitting: Endocrinology

## 2024-11-23 ENCOUNTER — Ambulatory Visit: Admitting: Cardiology
